# Patient Record
Sex: Male | Born: 1965 | Race: White | Hispanic: No | Smoking: Current every day smoker
Health system: Southern US, Community
[De-identification: ages and names within clinical notes are randomized; demographics above are authoritative.]

## PROBLEM LIST (undated history)

## (undated) DIAGNOSIS — R918 Other nonspecific abnormal finding of lung field: Secondary | ICD-10-CM

## (undated) DIAGNOSIS — C801 Malignant (primary) neoplasm, unspecified: Secondary | ICD-10-CM

## (undated) DIAGNOSIS — J449 Chronic obstructive pulmonary disease, unspecified: Secondary | ICD-10-CM

## (undated) HISTORY — PX: OTHER SURGICAL HISTORY: SHX169

---

## 2018-03-09 ENCOUNTER — Other Ambulatory Visit: Payer: Self-pay

## 2018-03-09 ENCOUNTER — Encounter: Payer: Self-pay | Admitting: Emergency Medicine

## 2018-03-09 ENCOUNTER — Inpatient Hospital Stay
Admission: EM | Admit: 2018-03-09 | Discharge: 2018-03-13 | DRG: 181 | Disposition: A | Discharge status: Home / Self Care | Payer: Self-pay | Attending: Internal Medicine | Admitting: Internal Medicine

## 2018-03-09 ENCOUNTER — Emergency Department: Payer: Self-pay

## 2018-03-09 DIAGNOSIS — I82210 Acute embolism and thrombosis of superior vena cava: Secondary | ICD-10-CM | POA: Diagnosis present

## 2018-03-09 DIAGNOSIS — C3491 Malignant neoplasm of unspecified part of right bronchus or lung: Principal | ICD-10-CM | POA: Diagnosis present

## 2018-03-09 DIAGNOSIS — J449 Chronic obstructive pulmonary disease, unspecified: Secondary | ICD-10-CM | POA: Diagnosis present

## 2018-03-09 DIAGNOSIS — R0602 Shortness of breath: Secondary | ICD-10-CM

## 2018-03-09 DIAGNOSIS — I82290 Acute embolism and thrombosis of other thoracic veins: Secondary | ICD-10-CM | POA: Diagnosis present

## 2018-03-09 DIAGNOSIS — I871 Compression of vein: Secondary | ICD-10-CM | POA: Diagnosis present

## 2018-03-09 DIAGNOSIS — Z9889 Other specified postprocedural states: Secondary | ICD-10-CM

## 2018-03-09 DIAGNOSIS — F17210 Nicotine dependence, cigarettes, uncomplicated: Secondary | ICD-10-CM | POA: Diagnosis present

## 2018-03-09 DIAGNOSIS — C3490 Malignant neoplasm of unspecified part of unspecified bronchus or lung: Secondary | ICD-10-CM

## 2018-03-09 HISTORY — DX: Chronic obstructive pulmonary disease, unspecified: J44.9

## 2018-03-09 HISTORY — DX: Other nonspecific abnormal finding of lung field: R91.8

## 2018-03-09 LAB — CBC
HEMATOCRIT: 40.2 % (ref 39.0–52.0)
Hemoglobin: 12.7 g/dL — ABNORMAL LOW (ref 13.0–17.0)
MCH: 28.5 pg (ref 26.0–34.0)
MCHC: 31.6 g/dL (ref 30.0–36.0)
MCV: 90.1 fL (ref 80.0–100.0)
NRBC: 0 % (ref 0.0–0.2)
PLATELETS: 258 10*3/uL (ref 150–400)
RBC: 4.46 MIL/uL (ref 4.22–5.81)
RDW: 13.2 % (ref 11.5–15.5)
WBC: 10 10*3/uL (ref 4.0–10.5)

## 2018-03-09 LAB — COMPREHENSIVE METABOLIC PANEL
ALBUMIN: 4 g/dL (ref 3.5–5.0)
ALT: 9 U/L (ref 0–44)
AST: 14 U/L — ABNORMAL LOW (ref 15–41)
Alkaline Phosphatase: 103 U/L (ref 38–126)
Anion gap: 11 (ref 5–15)
BUN: 10 mg/dL (ref 6–20)
CHLORIDE: 101 mmol/L (ref 98–111)
CO2: 28 mmol/L (ref 22–32)
CREATININE: 0.88 mg/dL (ref 0.61–1.24)
Calcium: 9.1 mg/dL (ref 8.9–10.3)
GFR calc Af Amer: >60 mL/min (ref >60)
GLUCOSE: 103 mg/dL — AB (ref 70–99)
POTASSIUM: 4.2 mmol/L (ref 3.5–5.1)
Sodium: 140 mmol/L (ref 135–145)
Total Bilirubin: 0.5 mg/dL (ref 0.3–1.2)
Total Protein: 7.6 g/dL (ref 6.5–8.1)

## 2018-03-09 LAB — TROPONIN I: Troponin I: <0.03 ng/mL (ref <0.03)

## 2018-03-09 LAB — PROTIME-INR
INR: 1.01
PROTHROMBIN TIME: 13.2 s (ref 11.4–15.2)

## 2018-03-09 LAB — HEPARIN LEVEL (UNFRACTIONATED): HEPARIN UNFRACTIONATED: 0.25 [IU]/mL — AB (ref 0.30–0.70)

## 2018-03-09 LAB — APTT: APTT: 34 s (ref 24–36)

## 2018-03-09 LAB — TSH: TSH: 1.759 u[IU]/mL (ref 0.350–4.500)

## 2018-03-09 LAB — T4, FREE: Free T4: 1.02 ng/dL (ref 0.82–1.77)

## 2018-03-09 MED ORDER — HEPARIN BOLUS VIA INFUSION
4000.0000 [IU] | Freq: Once | INTRAVENOUS | Status: AC
  Administered 2018-03-09: 4000 [IU] via INTRAVENOUS
  Filled 2018-03-09: qty 4000

## 2018-03-09 MED ORDER — SODIUM CHLORIDE 0.9% FLUSH: 3.0000 mL | INTRAVENOUS | Status: DC | PRN

## 2018-03-09 MED ORDER — SENNOSIDES-DOCUSATE SODIUM 8.6-50 MG PO TABS: 1.0000 | ORAL_TABLET | Freq: Every evening | ORAL | Status: DC | PRN

## 2018-03-09 MED ORDER — SODIUM CHLORIDE 0.9% FLUSH
3.0000 mL | Freq: Two times a day (BID) | INTRAVENOUS | Status: DC
  Administered 2018-03-09 – 2018-03-13 (×7): 3 mL via INTRAVENOUS

## 2018-03-09 MED ORDER — HEPARIN (PORCINE) IN NACL 100-0.45 UNIT/ML-% IJ SOLN
1350.0000 [IU]/h | INTRAMUSCULAR | Status: DC
  Administered 2018-03-09: 1050 [IU]/h via INTRAVENOUS
  Administered 2018-03-10: 1350 [IU]/h via INTRAVENOUS
  Filled 2018-03-09 (×2): qty 250

## 2018-03-09 MED ORDER — SODIUM CHLORIDE 0.9 % IV SOLN: 250.0000 mL | INTRAVENOUS | Status: DC | PRN

## 2018-03-09 MED ORDER — ACETAMINOPHEN 650 MG RE SUPP: 650.0000 mg | Freq: Four times a day (QID) | RECTAL | Status: DC | PRN

## 2018-03-09 MED ORDER — HYDROCODONE-ACETAMINOPHEN 5-325 MG PO TABS
1.0000 | ORAL_TABLET | ORAL | Status: DC | PRN
  Administered 2018-03-09: 1 via ORAL
  Filled 2018-03-09: qty 1

## 2018-03-09 MED ORDER — IPRATROPIUM-ALBUTEROL 0.5-2.5 (3) MG/3ML IN SOLN
3.0000 mL | Freq: Four times a day (QID) | RESPIRATORY_TRACT | Status: DC
  Administered 2018-03-09 – 2018-03-10 (×3): 3 mL via RESPIRATORY_TRACT
  Filled 2018-03-09 (×3): qty 3

## 2018-03-09 MED ORDER — ACETAMINOPHEN 325 MG PO TABS
650.0000 mg | ORAL_TABLET | Freq: Four times a day (QID) | ORAL | Status: DC | PRN
  Administered 2018-03-12: 650 mg via ORAL
  Filled 2018-03-09 (×2): qty 2

## 2018-03-09 MED ORDER — IOPAMIDOL (ISOVUE-300) INJECTION 61%
100.0000 mL | Freq: Once | INTRAVENOUS | Status: AC | PRN
  Administered 2018-03-09: 100 mL via INTRAVENOUS

## 2018-03-09 NOTE — Progress Notes (Signed)
Patient ID: Alexander [name], male   DOB: November 23, 1965, 52 y.o.   MRN: [id number] I have met and performed a preliminary assessment of the patient. He is a 52 year old current smoker with a 60 pack year history of smoking who has had issues with dyspnea for approximately two months and neck and facial swelling since approximately mid August. He was initially diagnosed with COPD and subsequently diagnosed with a potential allergic reaction to medications given for COPD. He presented today because of persistent symptoms. He has been seen in the emergency room and a CT scan of the chest shows that he has clot in the superior vena cava and there is evidence of a right para tracheal and right hilar mass. 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 The patient will need to be admitted for IV anticoagulation. He will require biopsy for definitive diagnosis. The patient is aware that he will need to have endobronchial ultrasound (EBUS) which will need to be done under general anesthesia. This procedure is tentatively scheduled for 10 o'clock in the morning on Thursday, October 17. This will allow for at least 24 to 48 hours of anticoagulation prior to the procedure. We will need to discontinue anticoagulation two hours before the procedure. I have discussed the case with Dr. Janese Banks, oncology, who came to evaluate the patient. A formal consult note will follow.

## 2018-03-09 NOTE — Progress Notes (Signed)
ANTICOAGULATION CONSULT NOTE - Initial Consult  Pharmacy Consult for heparin Indication: SVC thrombosis  Allergies  Allergen Reactions  . Penicillins     Patient Measurements: Height: 5\' 9"  (175.3 cm) Weight: 138 lb (62.6 kg) IBW/kg (Calculated) : 70.7 Heparin Dosing Weight: 62.6 kg  Vital Signs: Temp: 97.9 F (36.6 C) (10/15 1343) Temp Source: Oral (10/15 1343) BP: 119/88 (10/15 1343) Pulse Rate: 103 (10/15 1343)  Labs: Recent Labs    03/09/18 1051 03/09/18 1426  HGB 12.7*  --   HCT 40.2  --   PLT 258  --   CREATININE 0.88  --   TROPONINI  --  <0.03    Estimated Creatinine Clearance: 86.9 mL/min (by C-G formula based on SCr of 0.88 mg/dL).   Medical History: History reviewed. No pertinent past medical history.    Assessment: 52 yo male to start on heparin drip for SVC thrombosis. No PTA meds on med rec  Goal of Therapy:  Heparin level 0.3-0.7 units/ml Monitor platelets by anticoagulation protocol: Yes   Plan:  Heparin 4000 units IV x1 bolus then heparin drip at 1050 units/hr.  Heparin level 6h after start of heparin drip. CBC in AM  Pharmacy will continue to follow.   Rayna Sexton L 03/09/2018,3:49 PM

## 2018-03-09 NOTE — ED Notes (Signed)
Admitting MD at the bedside.  

## 2018-03-09 NOTE — H&P (Signed)
[facility] [place] NAME: Alexander [name]    MR#:  [id number]  DATE OF BIRTH:  24-Apr-1966  DATE OF ADMISSION:  03/09/2018  PRIMARY CARE PHYSICIAN: None   REQUESTING/REFERRING PHYSICIAN: Alfred Levins MD  CHIEF COMPLAINT: Facial and chest swelling  HISTORY OF PRESENT ILLNESS: Alexander [name]  is a 52 y.o. male with a known history of tobacco abuse, presented to the emergency room for swelling in the chest as well as facial and neck area.  He has dilated veins in the neck upper part of the chest.  He was evaluated at [facility] couple of weeks ago and treated for COPD.  Was worked up with CT chest and CT abdomen pelvis at our hospital which showed a lung mass and superior vena cava thrombosis.  This was discussed by ER physician with vascular surgery recommended IV heparin for anticoagulation.  Case was also discussed with oncologist by ER physician who recommended work-up for lung malignancy.  Patient is an active tobacco user.  Hospitalist service was consulted for further care.  PAST MEDICAL HISTORY:   Past Medical History:  Diagnosis Date  . COPD (chronic obstructive pulmonary disease) ([place])   . Lung mass     PAST SURGICAL HISTORY:  Past Surgical History:  Procedure Laterality Date  . none      SOCIAL HISTORY:  Social History   Tobacco Use  . Smoking status: Current Every Day Smoker  . Smokeless tobacco: Never Used  Substance Use Topics  . Alcohol use: Not Currently    FAMILY HISTORY: No family history on file.  DRUG ALLERGIES:  Allergies  Allergen Reactions  . Penicillins     REVIEW OF SYSTEMS:   CONSTITUTIONAL: No fever, has fatigue and weakness.  EYES: No blurred or double vision.  EARS, NOSE, AND THROAT: No tinnitus or ear pain.  RESPIRATORY: No cough,has  shortness of breath, no wheezing or hemoptysis.  CARDIOVASCULAR: No chest pain, orthopnea, edema.  GASTROINTESTINAL: No nausea, vomiting, diarrhea or abdominal pain.   GENITOURINARY: No dysuria, hematuria.  ENDOCRINE: No polyuria, nocturia,  HEMATOLOGY: No anemia, easy bruising or bleeding SKIN: Dilated veins over the chest wall in the upper part of the neck MUSCULOSKELETAL: No joint pain or arthritis.   NEUROLOGIC: No tingling, numbness, weakness.  PSYCHIATRY: No anxiety or depression.   MEDICATIONS AT HOME:  Prior to Admission medications   Not on File      PHYSICAL EXAMINATION:   VITAL SIGNS: Blood pressure 127/88, pulse 82, temperature 97.9 F (36.6 C), temperature source Oral, resp. rate 17, height 5\' 9"  (1.753 m), weight 62.6 kg, SpO2 99 %.  GENERAL:  52 y.o.-year-old patient lying in the bed with no acute distress.  EYES: Pupils equal, round, reactive to light and accommodation. No scleral icterus. Extraocular muscles intact.  HEENT: Head atraumatic, normocephalic. Oropharynx and nasopharynx clear.  NECK:  Supple, no jugular venous distention. No thyroid enlargement, no tenderness.  LUNGS: Normal breath sounds bilaterally, scattered wheezing, no rales,rhonchi or crepitation. No use of accessory muscles of respiration.  CARDIOVASCULAR: S1, S2 normal. No murmurs, rubs, or gallops.  ABDOMEN: Soft, nontender, nondistended. Bowel sounds present. No organomegaly or mass.  EXTREMITIES: No pedal edema, cyanosis, or clubbing.  NEUROLOGIC: Cranial nerves II through XII are intact. Muscle strength 5/5 in all extremities. Sensation intact. Gait not checked.  PSYCHIATRIC: The patient is alert and oriented x 3.  SKIN: No obvious rash, lesion, or ulcer.   LABORATORY PANEL:   CBC Recent  Labs  Lab 03/09/18 1051  WBC 10.0  HGB 12.7*  HCT 40.2  PLT 258  MCV 90.1  MCH 28.5  MCHC 31.6  RDW 13.2   ------------------------------------------------------------------------------------------------------------------  Chemistries  Recent Labs  Lab 03/09/18 1051  NA 140  K 4.2  CL 101  CO2 28  GLUCOSE 103*  BUN 10  CREATININE 0.88  CALCIUM 9.1   AST 14*  ALT 9  ALKPHOS 103  BILITOT 0.5   ------------------------------------------------------------------------------------------------------------------ estimated creatinine clearance is 86.9 mL/min (by C-G formula based on SCr of 0.88 mg/dL). ------------------------------------------------------------------------------------------------------------------ Recent Labs    03/09/18 1426  TSH 1.759     Coagulation profile Recent Labs  Lab 03/09/18 1608  INR 1.01   ------------------------------------------------------------------------------------------------------------------- No results for input(s): DDIMER in the last 72 hours. -------------------------------------------------------------------------------------------------------------------  Cardiac Enzymes Recent Labs  Lab 03/09/18 1426  TROPONINI <0.03   ------------------------------------------------------------------------------------------------------------------ Invalid input(s): POCBNP  ---------------------------------------------------------------------------------------------------------------  Urinalysis No results found for: COLORURINE, APPEARANCEUR, LABSPEC, PHURINE, GLUCOSEU, HGBUR, BILIRUBINUR, KETONESUR, PROTEINUR, UROBILINOGEN, NITRITE, LEUKOCYTESUR   RADIOLOGY: Ct Chest W Contrast  Result Date: 03/09/2018 CLINICAL DATA:  Facial and neck swelling for several months. Abdominal pain. EXAM: CT CHEST, ABDOMEN, AND PELVIS WITH CONTRAST TECHNIQUE: Multidetector CT imaging of the chest, abdomen and pelvis was performed following the standard protocol during bolus administration of intravenous contrast. CONTRAST:  162mL ISOVUE-300 IOPAMIDOL (ISOVUE-300) INJECTION 61% COMPARISON:  None. FINDINGS: CT CHEST FINDINGS Cardiovascular: There is no evidence of thoracic aortic dissection or aneurysm. Normal cardiac size. No pericardial effusion is noted. Thrombosis of the superior vena cava is noted which is  nearly occlusive. Small amount of contrast is seen flowing into the right atrium. This results in multiple collateral veins. Probable thrombotic occlusion of left brachiocephalic vein is noted as well. Mediastinum/Nodes: Previously described SVC thrombosis and severe narrowing appears to be due to ill-defined soft tissue mass in right paratracheal and precarinal area measuring 3.2 x 2.3 cm, as well as right hilar mass or node measuring 2.2 x 1.7 cm. Thyroid gland is unremarkable. Esophagus is unremarkable. Lungs/Pleura: No pneumothorax or pleural effusion is noted. Emphysematous disease is noted in both upper lobes. 2.1 x 2.0 cm irregular mass is noted medially in the right upper lobe concerning for malignancy. Also noted is spiculated density measuring 12 x 9 mm with pleural tail in right upper lobe concerning for possible malignancy. Musculoskeletal: No chest wall mass or suspicious bone lesions identified. CT ABDOMEN PELVIS FINDINGS Hepatobiliary: No focal liver abnormality is seen. No gallstones, gallbladder wall thickening, or biliary dilatation. Pancreas: Unremarkable. No pancreatic ductal dilatation or surrounding inflammatory changes. Spleen: Normal in size without focal abnormality. Adrenals/Urinary Tract: Adrenal glands are unremarkable. Kidneys are normal, without renal calculi, focal lesion, or hydronephrosis. Bladder is unremarkable. Stomach/Bowel: The stomach appears normal. The appendix is not visualized. There is no evidence of bowel obstruction or inflammation. Vascular/Lymphatic: Atherosclerosis of abdominal aorta is noted without aneurysm or dissection. IVC and iliac veins appear to be patent. However, collateral veins are noted in the anterior subcutaneous tissues of the abdomen. No significant adenopathy is noted in the abdomen or pelvis. Reproductive: Prostate is unremarkable. Other: No abdominal wall hernia or abnormality. No abdominopelvic ascites. Musculoskeletal: No acute or significant  osseous findings. IMPRESSION: 2.1 x 2.0 cm irregular mass is noted medially in right upper lobe concerning for malignancy. Also noted is probable right paratracheal, right hilar and precarinal adenopathy or malignancy, which results in severe narrowing and thrombosis of the superior vena cava. This results in multiple collateral veins  in the chest and abdomen, as well as probable thrombosis of the left brachycephalic vein. This is consistent with SVC syndrome. PET scan is recommended for further evaluation. Aortic Atherosclerosis (ICD10-I70.0) and Emphysema (ICD10-J43.9). Electronically Signed   By: Marijo Conception, M.D.   On: 03/09/2018 15:19   Ct Abdomen Pelvis W Contrast  Result Date: 03/09/2018 CLINICAL DATA:  Facial and neck swelling for several months. Abdominal pain. EXAM: CT CHEST, ABDOMEN, AND PELVIS WITH CONTRAST TECHNIQUE: Multidetector CT imaging of the chest, abdomen and pelvis was performed following the standard protocol during bolus administration of intravenous contrast. CONTRAST:  111mL ISOVUE-300 IOPAMIDOL (ISOVUE-300) INJECTION 61% COMPARISON:  None. FINDINGS: CT CHEST FINDINGS Cardiovascular: There is no evidence of thoracic aortic dissection or aneurysm. Normal cardiac size. No pericardial effusion is noted. 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 Small amount of contrast is seen flowing into the right atrium. This results in multiple collateral veins. Probable thrombotic occlusion of left brachiocephalic vein is noted as well. Mediastinum/Nodes: Previously described SVC thrombosis and severe narrowing appears to be due to ill-defined soft tissue mass in right paratracheal and precarinal area measuring 3.2 x 2.3 cm, as well as right hilar mass or node measuring 2.2 x 1.7 cm. Thyroid gland is unremarkable. Esophagus is unremarkable. Lungs/Pleura: No pneumothorax or pleural effusion is noted. Emphysematous disease is noted in both upper lobes. 2.1 x 2.0 cm  irregular mass is noted medially in the right upper lobe concerning for malignancy. Also noted is spiculated density measuring 12 x 9 mm with pleural tail in right upper lobe concerning for possible malignancy. Musculoskeletal: No chest wall mass or suspicious bone lesions identified. CT ABDOMEN PELVIS FINDINGS Hepatobiliary: No focal liver abnormality is seen. No gallstones, gallbladder wall thickening, or biliary dilatation. Pancreas: Unremarkable. No pancreatic ductal dilatation or surrounding inflammatory changes. Spleen: Normal in size without focal abnormality. Adrenals/Urinary Tract: Adrenal glands are unremarkable. Kidneys are normal, without renal calculi, focal lesion, or hydronephrosis. Bladder is unremarkable. Stomach/Bowel: The stomach appears normal. The appendix is not visualized. There is no evidence of bowel obstruction or inflammation. Vascular/Lymphatic: Atherosclerosis of abdominal aorta is noted without aneurysm or dissection. IVC and iliac veins appear to be patent. However, collateral veins are noted in the anterior subcutaneous tissues of the abdomen. No significant adenopathy is noted in the abdomen or pelvis. Reproductive: Prostate is unremarkable. Other: No abdominal wall hernia or abnormality. No abdominopelvic ascites. Musculoskeletal: No acute or significant osseous findings. IMPRESSION: 2.1 x 2.0 cm irregular mass is noted medially in right upper lobe concerning for malignancy. Also noted is probable right paratracheal, right hilar and precarinal adenopathy or malignancy, which results in severe narrowing and thrombosis of the superior vena cava. This results in multiple collateral veins in the chest and abdomen, as well as probable thrombosis of the left brachycephalic vein. This is consistent with SVC syndrome. PET scan is recommended for further evaluation. Aortic Atherosclerosis (ICD10-I70.0) and Emphysema (ICD10-J43.9). Electronically Signed   By: Marijo Conception, M.D.   On:  03/09/2018 15:19    EKG: Orders placed or performed during the hospital encounter of 03/09/18  . ED EKG  . ED EKG  . EKG 12-Lead  . EKG 12-Lead  . EKG 12-Lead  . EKG 12-Lead  . EKG 12-Lead  . 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 Management plans discussed with the patient, family and they are in agreement.  CODE STATUS:Full code    TOTAL TIME TAKING CARE OF THIS PATIENT: 52 minutes.    Saundra Shelling M.D on 03/09/2018 at 5:07 PM  Between 7am to 6pm - Pager - [phone]  After 6pm go to [email or web address] - password [email or web address] [place] [facility]  Office  [phone]  CC: Primary care physician; Patient, No Pcp Per

## 2018-03-09 NOTE — ED Provider Notes (Signed)
[facility] Emergency Department Provider Note  ____________________________________________  Time seen: Approximately 2:34 PM  I have reviewed the triage vital signs and the nursing notes.   HISTORY  Chief Complaint No chief complaint on file.   HPI Alexander [name] is a 52 y.o. male with a history of smoking and COPD who presents for evaluation of facial neck and chest swelling.  Patient reports a long history of smoking.  Was seen at [facility] in August and told he had COPD while a patient in the ED.  Never had any formal testing done.  He was put on steroids, albuterol and doxycycline.  A few days later he noticed swelling of the veins of his chest, right arm, bilateral neck and face.  He went back to [facility] and he was told that this was a side effect of the medication and told to stop all medications.  Patient reports that the swelling has been persistent for several months.  He usually wakes up in the morning and his face is very swollen, the neck veins are swollen, the right upper extremity and right chest wall have very swollen veins as well.  As the day goes by the swelling decreases.  He also reports a 30 pound unintentional weight loss over the last 3 to 4 months.  No night sweats.  No family history of cancer.  Patient denies any chest pain or shortness of breath.  PMH COPD  Allergies Penicillins  FH No h/o cancer  Social History Smoking - yes Alcohol - not currently  Drugs - no  Review of Systems  Constitutional: Negative for fever. Eyes: Negative for visual changes. ENT: Negative for sore throat. + b/l facial swelling Neck: No neck pain. + b/l neck vein swelling  Cardiovascular: Negative for chest pain. + chest vein swelling Respiratory: Negative for shortness of breath. Gastrointestinal: Negative for abdominal pain, vomiting or diarrhea. Genitourinary: Negative for dysuria. Musculoskeletal: Negative for back pain. Skin: Negative for  rash. Neurological: Negative for headaches, weakness or numbness. Psych: No SI or HI  ____________________________________________   PHYSICAL EXAM:  VITAL SIGNS: ED Triage Vitals  Enc Vitals Group     BP 03/09/18 1043 117/81     Pulse Rate 03/09/18 1043 89     Resp 03/09/18 1043 16     Temp 03/09/18 1051 98.2 F (36.8 C)     Temp Source 03/09/18 1051 Oral     SpO2 03/09/18 1043 99 %     Weight 03/09/18 1044 138 lb (62.6 kg)     Height 03/09/18 1044 5\' 9"  (1.753 m)     Head Circumference --      Peak Flow --      Pain Score 03/09/18 1044 4     Pain Loc --      Pain Edu? --      Excl. in [place]? --     Constitutional: Alert and oriented. Well appearing and in no apparent distress. HEENT:      Head: Normocephalic and atraumatic.  no swelling noted on face and head      Eyes: Conjunctivae are normal. Sclera is non-icteric. No ptosis or myosis      Mouth/Throat: Mucous membranes are moist.       Neck: Supple with no signs of meningismus. Patient has distended bilateral neck veins Cardiovascular: Regular rate and rhythm. No murmurs, gallops, or rubs. 2+ symmetrical distal pulses are present in all extremities. No JVD. Chest wall shows distended veins bilaterally worse on  the R Respiratory: Normal respiratory effort. Lungs are clear to auscultation bilaterally. No wheezes, crackles, or rhonchi.  Gastrointestinal: Soft, non tender, and non distended with positive bowel sounds. No rebound or guarding. Musculoskeletal: Nontender with normal range of motion in all extremities. No edema, cyanosis, or erythema of extremities. Neurologic: Normal speech and language. Face is symmetric. Moving all extremities. No gross focal neurologic deficits are appreciated. Skin: Skin is warm, dry and intact. No rash noted. Psychiatric: Mood and affect are normal. Speech and behavior are normal.  ____________________________________________   LABS (all labs ordered are listed, but only abnormal results  are displayed)  Labs Reviewed  CBC - Abnormal; Notable for the following components:      Result Value   Hemoglobin 12.7 (*)    All other components within normal limits  COMPREHENSIVE METABOLIC PANEL - Abnormal; Notable for the following components:   Glucose, Bld 103 (*)    AST 14 (*)    All other components within normal limits  TSH  T4, FREE  TROPONIN I   ____________________________________________  EKG  ED ECG REPORT I, Rudene Re, the attending physician, personally viewed and interpreted this ECG.  Normal sinus rhythm, rate of 84, normal intervals, right axis deviation, no ST elevations or depressions.  No prior for comparison peer ____________________________________________  RADIOLOGY  I have personally reviewed the images performed during this visit and I agree with the Radiologist's read.   Interpretation by Radiologist:  Ct Chest W Contrast  Result Date: 03/09/2018 CLINICAL DATA:  Facial and neck swelling for several months. Abdominal pain. EXAM: CT CHEST, ABDOMEN, AND PELVIS WITH CONTRAST TECHNIQUE: Multidetector CT imaging of the chest, abdomen and pelvis was performed following the standard protocol during bolus administration of intravenous contrast. CONTRAST:  135mL ISOVUE-300 IOPAMIDOL (ISOVUE-300) INJECTION 61% COMPARISON:  None. FINDINGS: CT CHEST FINDINGS Cardiovascular: There is no evidence of thoracic aortic dissection or aneurysm. Normal cardiac size. No pericardial effusion is noted. 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 Small amount of contrast is seen flowing into the right atrium. This results in multiple collateral veins. Probable thrombotic occlusion of left brachiocephalic vein is noted as well. Mediastinum/Nodes: Previously described SVC thrombosis and severe narrowing appears to be due to ill-defined soft tissue mass in right paratracheal and precarinal area measuring 3.2 x 2.3 cm, as well as right hilar  mass or node measuring 2.2 x 1.7 cm. Thyroid gland is unremarkable. Esophagus is unremarkable. Lungs/Pleura: No pneumothorax or pleural effusion is noted. Emphysematous disease is noted in both upper lobes. 2.1 x 2.0 cm irregular mass is noted medially in the right upper lobe concerning for malignancy. Also noted is spiculated density measuring 12 x 9 mm with pleural tail in right upper lobe concerning for possible malignancy. Musculoskeletal: No chest wall mass or suspicious bone lesions identified. CT ABDOMEN PELVIS FINDINGS Hepatobiliary: No focal liver abnormality is seen. No gallstones, gallbladder wall thickening, or biliary dilatation. Pancreas: Unremarkable. No pancreatic ductal dilatation or surrounding inflammatory changes. Spleen: Normal in size without focal abnormality. Adrenals/Urinary Tract: Adrenal glands are unremarkable. Kidneys are normal, without renal calculi, focal lesion, or hydronephrosis. Bladder is unremarkable. Stomach/Bowel: The stomach appears normal. The appendix is not visualized. There is no evidence of bowel obstruction or inflammation. Vascular/Lymphatic: Atherosclerosis of abdominal aorta is noted without aneurysm or dissection. IVC and iliac veins appear to be patent. However, collateral veins are noted in the anterior subcutaneous tissues of the abdomen. No significant adenopathy is noted in the  abdomen or pelvis. Reproductive: Prostate is unremarkable. Other: No abdominal wall hernia or abnormality. No abdominopelvic ascites. Musculoskeletal: No acute or significant osseous findings. IMPRESSION: 2.1 x 2.0 cm irregular mass is noted medially in right upper lobe concerning for malignancy. Also noted is probable right paratracheal, right hilar and precarinal adenopathy or malignancy, which results in severe narrowing and thrombosis of the superior vena cava. This results in multiple collateral veins in the chest and abdomen, as well as probable thrombosis of the left brachycephalic  vein. This is consistent with SVC syndrome. PET scan is recommended for further evaluation. Aortic Atherosclerosis (ICD10-I70.0) and Emphysema (ICD10-J43.9). Electronically Signed   By: Marijo Conception, M.D.   On: 03/09/2018 15:19   Ct Abdomen Pelvis W Contrast  Result Date: 03/09/2018 CLINICAL DATA:  Facial and neck swelling for several months. Abdominal pain. EXAM: CT CHEST, ABDOMEN, AND PELVIS WITH CONTRAST TECHNIQUE: Multidetector CT imaging of the chest, abdomen and pelvis was performed following the standard protocol during bolus administration of intravenous contrast. CONTRAST:  119mL ISOVUE-300 IOPAMIDOL (ISOVUE-300) INJECTION 61% COMPARISON:  None. FINDINGS: CT CHEST FINDINGS Cardiovascular: There is no evidence of thoracic aortic dissection or aneurysm. Normal cardiac size. No pericardial effusion is noted. 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 Small amount of contrast is seen flowing into the right atrium. This results in multiple collateral veins. Probable thrombotic occlusion of left brachiocephalic vein is noted as well. Mediastinum/Nodes: Previously described SVC thrombosis and severe narrowing appears to be due to ill-defined soft tissue mass in right paratracheal and precarinal area measuring 3.2 x 2.3 cm, as well as right hilar mass or node measuring 2.2 x 1.7 cm. Thyroid gland is unremarkable. Esophagus is unremarkable. Lungs/Pleura: No pneumothorax or pleural effusion is noted. Emphysematous disease is noted in both upper lobes. 2.1 x 2.0 cm irregular mass is noted medially in the right upper lobe concerning for malignancy. Also noted is spiculated density measuring 12 x 9 mm with pleural tail in right upper lobe concerning for possible malignancy. Musculoskeletal: No chest wall mass or suspicious bone lesions identified. CT ABDOMEN PELVIS FINDINGS Hepatobiliary: No focal liver abnormality is seen. No gallstones, gallbladder wall thickening, or biliary  dilatation. Pancreas: Unremarkable. No pancreatic ductal dilatation or surrounding inflammatory changes. Spleen: Normal in size without focal abnormality. Adrenals/Urinary Tract: Adrenal glands are unremarkable. Kidneys are normal, without renal calculi, focal lesion, or hydronephrosis. Bladder is unremarkable. Stomach/Bowel: The stomach appears normal. The appendix is not visualized. There is no evidence of bowel obstruction or inflammation. Vascular/Lymphatic: Atherosclerosis of abdominal aorta is noted without aneurysm or dissection. IVC and iliac veins appear to be patent. However, collateral veins are noted in the anterior subcutaneous tissues of the abdomen. No significant adenopathy is noted in the abdomen or pelvis. Reproductive: Prostate is unremarkable. Other: No abdominal wall hernia or abnormality. No abdominopelvic ascites. Musculoskeletal: No acute or significant osseous findings. IMPRESSION: 2.1 x 2.0 cm irregular mass is noted medially in right upper lobe concerning for malignancy. Also noted is probable right paratracheal, right hilar and precarinal adenopathy or malignancy, which results in severe narrowing and thrombosis of the superior vena cava. This results in multiple collateral veins in the chest and abdomen, as well as probable thrombosis of the left brachycephalic vein. This is consistent with SVC syndrome. PET scan is recommended for further evaluation. Aortic Atherosclerosis (ICD10-I70.0) and Emphysema (ICD10-J43.9). Electronically Signed   By: Marijo Conception, M.D.   On: 03/09/2018 15:19  ____________________________________________   PROCEDURES  Procedure(s) performed: None Procedures Critical Care performed: yes  CRITICAL CARE Performed by: Rudene Re  ?  Total critical care time: 35 min  Critical care time was exclusive of separately billable procedures and treating other patients.  Critical care was necessary to treat or prevent imminent or  life-threatening deterioration.  Critical care was time spent personally by me on the following activities: development of treatment plan with patient and/or surrogate as well as nursing, discussions with consultants, evaluation of patient's response to treatment, examination of patient, obtaining history from patient or surrogate, ordering and performing treatments and interventions, ordering and review of laboratory studies, ordering and review of radiographic studies, pulse oximetry and re-evaluation of patient's condition.  ____________________________________________   INITIAL IMPRESSION / ASSESSMENT AND PLAN / ED COURSE   52 y.o. male with a history of smoking and COPD who presents for evaluation of facial, neck, and chest swelling, 30 lbs unintentional weight loss.  On exam patient does have distended bilateral neck and chest veins, no obvious distention of veins in the extremities, no swelling of the face.  No ptosis or myosis.  With his symptoms I am obviously concerned for malignancy causing some type of vena cava syndrome. Will send patient for CT of the chest and abdomen. EKG and labs pending.     _________________________ 3:37 PM on 03/09/2018 -----------------------------------------  CT concerning for right upper lobe malignancy with a significant adenopathy in the head and neck causing SVC syndrome, SVC thrombosis, left brachiocephalic vein thrombosis.  Discussed with Dr. Leotis Pain who recommended started patient on heparin.  Discussed with Dr. Janese Banks from oncology who will consult. Discussed with Dr. Annamaria Boots for admission.  Patient updated on the findings of CT scan and need for admission.   As part of my medical decision making, I reviewed the following data within the [place] notes reviewed and incorporated, Labs reviewed , EKG interpreted , Old chart reviewed, Radiograph reviewed , Discussed with admitting physician , A consult was requested and obtained  from this/these consultant(s) vascular surgery and oncology, Notes from prior ED visits and [place] Controlled Substance Database    Pertinent labs & imaging results that were available during my care of the patient were reviewed by me and considered in my medical decision making (see chart for details).    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    Rudene Re, MD 03/09/18 [phone]

## 2018-03-09 NOTE — Progress Notes (Signed)
Advanced care plan.  Purpose of the Encounter: CODE STATUS  Parties in Attendance: Patient  Patient's Decision Capacity: Good  Subjective/Patient's story: Presented to the emergency room with facial and neck swelling  Objective/Medical story Has superior vena caval syndrome and thrombosis of the SVC Needs anticoagulation with IV heparin drip  Goals of care determination:  Advance care directives and goals of care discussed  Treatment plan discussed with Patient wants everything done which includes CPR, intubation ventilator if the need arises   CODE STATUS: Full code   Time spent discussing advanced care planning: 16 minutes

## 2018-03-09 NOTE — Consult Note (Signed)
Hematology/Oncology Consult note [facility] Telephone:([phone] [phone]  Patient Care Team: Patient, No Pcp Per as PCP - General (General Practice)   Name of the patient: Alexander [name]  [id number]  1966/02/15    Reason for consult: lung mass. SVC syndrome   Requesting physician: Dr. Alfred Levins  Date of visit: 03/09/2018    History of presenting illness-patient is a 52 year old male with a long-standing history of smoking.  He has smoked 1 to 1/2 pack of cigarettes per day for over 30 years.  He presented to outside urgent care with some symptoms of shortness of breath and was treated for URI.  He subsequently presented with facial and neck swelling which was thought by outside ER to be secondary to a drug reaction.  Following that patient came to ER here at [facility].  He underwent CT chest abdomen and pelvis which showed 2.1 x 2 cm irregular mass in the right upper lobe concerning for malignancy..  Multiple collateral veins in chest and abdomen probable thrombosis of the left brachiocephalic vein.  This is consistent with IVC syndrome  Probable right paratracheal right hilar and precarinal adenopathy resulting in severe narrowing and thrombosis of SVC.  This is consistent with SVC syndrome  Patient reports he has a good appetite and denies any unintentional weight loss.  He reports some pain in his right shoulder but denies other complaints.  He does not feel short of breath at rest.  ECOG PS- 0  Pain scale- 0   Review of systems- Review of Systems  Constitutional: Negative for chills, fever, malaise/fatigue and weight loss.  HENT: Negative for congestion, ear discharge and nosebleeds.        Neck swelling  Eyes: Negative for blurred vision.  Respiratory: Negative for cough, hemoptysis, sputum production, shortness of breath and wheezing.   Cardiovascular: Negative for chest pain, palpitations, orthopnea and claudication.  Gastrointestinal:  Negative for abdominal pain, blood in stool, constipation, diarrhea, heartburn, melena, nausea and vomiting.  Genitourinary: Negative for dysuria, flank pain, frequency, hematuria and urgency.  Musculoskeletal: Negative for back pain, joint pain and myalgias.  Skin: Negative for rash.  Neurological: Negative for dizziness, tingling, focal weakness, seizures, weakness and headaches.  Endo/Heme/Allergies: Does not bruise/bleed easily.  Psychiatric/Behavioral: Negative for depression and suicidal ideas. The patient does not have insomnia.     Allergies  Allergen Reactions  . Penicillins     Patient Active Problem List   Diagnosis Date Noted  . 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 COPD (chronic obstructive pulmonary disease) ([place])   . Lung mass      Past Surgical History:  Procedure Laterality Date  . none      Social History   Socioeconomic History  . Marital status: Divorced    Spouse name: Not on file  . Number of children: Not on file  . Years of education: Not on file  . Highest education level: Not on file  Occupational History  . Occupation: Cabin crew  Social Needs  . Financial resource strain: Not on file  . Food insecurity:    Worry: Not on file    Inability: Not on file  . Transportation needs:    Medical: Not on file    Non-medical: Not on file  Tobacco Use  . Smoking status: Current Every Day Smoker  . Smokeless tobacco: Never Used  Substance and Sexual Activity  . Alcohol use: Not Currently  . Drug  use: Not Currently  . Sexual activity: Yes  Lifestyle  . Physical activity:    Days per week: Not on file    Minutes per session: Not on file  . Stress: Not on file  Relationships  . Social connections:    Talks on phone: Not on file    Gets together: Not on file    Attends religious service: Not on file    Active member of club or organization: Not on file    Attends meetings of clubs or organizations: Not on  file    Relationship status: Not on file  . Intimate partner violence:    Fear of current or ex partner: Not on file    Emotionally abused: Not on file    Physically abused: Not on file    Forced sexual activity: Not on file  Other Topics Concern  . Not on file  Social History Narrative  . Not on file     No family history on file.   Current Facility-Administered Medications:  .  0.9 %  sodium chloride infusion, 250 mL, Intravenous, PRN, Pyreddy, Pavan, MD .  acetaminophen (TYLENOL) tablet 650 mg, 650 mg, Oral, Q6H PRN **OR** acetaminophen (TYLENOL) suppository 650 mg, 650 mg, Rectal, Q6H PRN, Pyreddy, Pavan, MD .  [COMPLETED] heparin bolus via infusion 4,000 Units, 4,000 Units, Intravenous, Once, 4,000 Units at 03/09/18 1652 **FOLLOWED BY** heparin ADULT infusion 100 units/mL (25000 units/269mL sodium chloride 0.45%), 1,050 Units/hr, Intravenous, Continuous, Rocky Morel, RPH, Last Rate: 10.5 mL/hr at 03/09/18 1651, 1,050 Units/hr at 03/09/18 1651 .  HYDROcodone-acetaminophen (NORCO/VICODIN) 5-325 MG per tablet 1-2 tablet, 1-2 tablet, Oral, Q4H PRN, Pyreddy, Pavan, MD .  ipratropium-albuterol (DUONEB) 0.5-2.5 (3) MG/3ML nebulizer solution 3 mL, 3 mL, Nebulization, Q6H, Pyreddy, Pavan, MD .  senna-docusate (Senokot-S) tablet 1 tablet, 1 tablet, Oral, QHS PRN, Pyreddy, Pavan, MD .  sodium chloride flush (NS) 0.9 % injection 3 mL, 3 mL, Intravenous, Q12H, Pyreddy, Pavan, MD .  sodium chloride flush (NS) 0.9 % injection 3 mL, 3 mL, Intravenous, PRN, Saundra Shelling, MD   Physical exam:  Vitals:   03/09/18 1343 03/09/18 1600 03/09/18 1700 03/09/18 1818  BP: 119/88 127/88 (!) 145/93 107/88  Pulse: (!) 103 82 96 (!) 104  Resp: 20 17 16 20   Temp: 97.9 F (36.6 C)   98.4 F (36.9 C)  TempSrc: Oral   Oral  SpO2: 98% 99% 98% 96%  Weight:    136 lb 3.2 oz (61.8 kg)  Height:    5\' 9"  (1.753 m)   Physical Exam  Constitutional: He is oriented to person, place, and time. He appears  well-developed and well-nourished.  HENT:  Head: Normocephalic and atraumatic.  Bilateral distended neck veins as well as neck swelling and mild facial swelling noted. Chest wall veins appear normal.  Eyes: Pupils are equal, round, and reactive to light. EOM are normal.  Neck: Normal range of motion.  Cardiovascular: Normal rate, regular rhythm and normal heart sounds.  Pulmonary/Chest: Effort normal and breath sounds normal.  Abdominal: Soft. Bowel sounds are normal.  Lymphadenopathy:  No palpable cervical or supraclavicular adenopathy  Neurological: He is alert and oriented to person, place, and time.  Skin: Skin is warm and dry.       CMP Latest Ref Rng & Units 03/09/2018  Glucose 70 - 99 mg/dL 103(H)  BUN 6 - 20 mg/dL 10  Creatinine 0.61 - 1.24 mg/dL 0.88  Sodium 135 - 145 mmol/L 140  Potassium 3.5 -  5.1 mmol/L 4.2  Chloride 98 - 111 mmol/L 101  CO2 22 - 32 mmol/L 28  Calcium 8.9 - 10.3 mg/dL 9.1  Total Protein 6.5 - 8.1 g/dL 7.6  Total Bilirubin 0.3 - 1.2 mg/dL 0.5  Alkaline Phos 38 - 126 U/L 103  AST 15 - 41 U/L 14(L)  ALT 0 - 44 U/L 9   CBC Latest Ref Rng & Units 03/09/2018  WBC 4.0 - 10.5 K/uL 10.0  Hemoglobin 13.0 - 17.0 g/dL 12.7(L)  Hematocrit 39.0 - 52.0 % 40.2  Platelets 150 - 400 K/uL 258    @IMAGES @  Ct Chest W Contrast  Result Date: 03/09/2018 CLINICAL DATA:  Facial and neck swelling for several months. Abdominal pain. EXAM: CT CHEST, ABDOMEN, AND PELVIS WITH CONTRAST TECHNIQUE: Multidetector CT imaging of the chest, abdomen and pelvis was performed following the standard protocol during bolus administration of intravenous contrast. CONTRAST:  111mL ISOVUE-300 IOPAMIDOL (ISOVUE-300) INJECTION 61% COMPARISON:  None. FINDINGS: CT CHEST FINDINGS Cardiovascular: There is no evidence of thoracic aortic dissection or aneurysm. Normal cardiac size. No pericardial effusion is noted. 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 Small  amount of contrast is seen flowing into the right atrium. This results in multiple collateral veins. Probable thrombotic occlusion of left brachiocephalic vein is noted as well. Mediastinum/Nodes: Previously described SVC thrombosis and severe narrowing appears to be due to ill-defined soft tissue mass in right paratracheal and precarinal area measuring 3.2 x 2.3 cm, as well as right hilar mass or node measuring 2.2 x 1.7 cm. Thyroid gland is unremarkable. Esophagus is unremarkable. Lungs/Pleura: No pneumothorax or pleural effusion is noted. Emphysematous disease is noted in both upper lobes. 2.1 x 2.0 cm irregular mass is noted medially in the right upper lobe concerning for malignancy. Also noted is spiculated density measuring 12 x 9 mm with pleural tail in right upper lobe concerning for possible malignancy. Musculoskeletal: No chest wall mass or suspicious bone lesions identified. CT ABDOMEN PELVIS FINDINGS Hepatobiliary: No focal liver abnormality is seen. No gallstones, gallbladder wall thickening, or biliary dilatation. Pancreas: Unremarkable. No pancreatic ductal dilatation or surrounding inflammatory changes. Spleen: Normal in size without focal abnormality. Adrenals/Urinary Tract: Adrenal glands are unremarkable. Kidneys are normal, without renal calculi, focal lesion, or hydronephrosis. Bladder is unremarkable. Stomach/Bowel: The stomach appears normal. The appendix is not visualized. There is no evidence of bowel obstruction or inflammation. Vascular/Lymphatic: Atherosclerosis of abdominal aorta is noted without aneurysm or dissection. IVC and iliac veins appear to be patent. However, collateral veins are noted in the anterior subcutaneous tissues of the abdomen. No significant adenopathy is noted in the abdomen or pelvis. Reproductive: Prostate is unremarkable. Other: No abdominal wall hernia or abnormality. No abdominopelvic ascites. Musculoskeletal: No acute or significant osseous findings.  IMPRESSION: 2.1 x 2.0 cm irregular mass is noted medially in right upper lobe concerning for malignancy. Also noted is probable right paratracheal, right hilar and precarinal adenopathy or malignancy, which results in severe narrowing and thrombosis of the superior vena cava. This results in multiple collateral veins in the chest and abdomen, as well as probable thrombosis of the left brachycephalic vein. This is consistent with SVC syndrome. PET scan is recommended for further evaluation. Aortic Atherosclerosis (ICD10-I70.0) and Emphysema (ICD10-J43.9). Electronically Signed   By: Marijo Conception, M.D.   On: 03/09/2018 15:19   Ct Abdomen Pelvis W Contrast  Result Date: 03/09/2018 CLINICAL DATA:  Facial and neck swelling for several months. Abdominal pain. EXAM:  CT CHEST, ABDOMEN, AND PELVIS WITH CONTRAST TECHNIQUE: Multidetector CT imaging of the chest, abdomen and pelvis was performed following the standard protocol during bolus administration of intravenous contrast. CONTRAST:  182mL ISOVUE-300 IOPAMIDOL (ISOVUE-300) INJECTION 61% COMPARISON:  None. FINDINGS: CT CHEST FINDINGS Cardiovascular: There is no evidence of thoracic aortic dissection or aneurysm. Normal cardiac size. No pericardial effusion is noted. 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 Small amount of contrast is seen flowing into the right atrium. This results in multiple collateral veins. Probable thrombotic occlusion of left brachiocephalic vein is noted as well. Mediastinum/Nodes: Previously described SVC thrombosis and severe narrowing appears to be due to ill-defined soft tissue mass in right paratracheal and precarinal area measuring 3.2 x 2.3 cm, as well as right hilar mass or node measuring 2.2 x 1.7 cm. Thyroid gland is unremarkable. Esophagus is unremarkable. Lungs/Pleura: No pneumothorax or pleural effusion is noted. Emphysematous disease is noted in both upper lobes. 2.1 x 2.0 cm irregular mass is  noted medially in the right upper lobe concerning for malignancy. Also noted is spiculated density measuring 12 x 9 mm with pleural tail in right upper lobe concerning for possible malignancy. Musculoskeletal: No chest wall mass or suspicious bone lesions identified. CT ABDOMEN PELVIS FINDINGS Hepatobiliary: No focal liver abnormality is seen. No gallstones, gallbladder wall thickening, or biliary dilatation. Pancreas: Unremarkable. No pancreatic ductal dilatation or surrounding inflammatory changes. Spleen: Normal in size without focal abnormality. Adrenals/Urinary Tract: Adrenal glands are unremarkable. Kidneys are normal, without renal calculi, focal lesion, or hydronephrosis. Bladder is unremarkable. Stomach/Bowel: The stomach appears normal. The appendix is not visualized. There is no evidence of bowel obstruction or inflammation. Vascular/Lymphatic: Atherosclerosis of abdominal aorta is noted without aneurysm or dissection. IVC and iliac veins appear to be patent. However, collateral veins are noted in the anterior subcutaneous tissues of the abdomen. No significant adenopathy is noted in the abdomen or pelvis. Reproductive: Prostate is unremarkable. Other: No abdominal wall hernia or abnormality. No abdominopelvic ascites. Musculoskeletal: No acute or significant osseous findings. IMPRESSION: 2.1 x 2.0 cm irregular mass is noted medially in right upper lobe concerning for malignancy. Also noted is probable right paratracheal, right hilar and precarinal adenopathy or malignancy, which results in severe narrowing and thrombosis of the superior vena cava. This results in multiple collateral veins in the chest and abdomen, as well as probable thrombosis of the left brachycephalic vein. This is consistent with SVC syndrome. PET scan is recommended for further evaluation. Aortic Atherosclerosis (ICD10-I70.0) and Emphysema (ICD10-J43.9). Electronically Signed   By: Marijo Conception, M.D.   On: 03/09/2018 15:19     Assessment and plan- Patient is a 52 y.o. male presenting with SVC thrombosis and SVC syndrome clinically as well as seen on CT scan.  He is found to have a right upper lobe lung mass as well as mediastinal adenopathy concerning for lung cancer  1.  I have reviewed CT chest abdomen and pelvis images independently and I discussed findings with the patient.  Patient noted to have right upper lobe lung mass as well as mediastinal adenopathy causing severe narrowing of his SVC and resulting SVC thrombosis.  2.  Patient will need to be put on heparin drip for his SVC thrombosis and will also need vascular surgery consult to see if there would be any role for endovascular therapies  3.  I have already spoken to Dr. Patsey Berthold from pulmonary and she will be doing a needle guided biopsy of  the mediastinal lymph nodes on Thursday, 03/11/2018.  Please keep him n.p.o. after midnight and touch base with pulmonary regarding stopping the heparin drip prior to procedure.  4.  I will touch base with radiation oncology tomorrow morning so that they can plan radiation and proceed with radiation asap once the biopsy results are back.  5.  I discussed with the patient that the findings of CT scan highly concerning for lung cancer reportedly less likely.  I also got in touch with pathology to see if they could expedite his pathology results once the bronchoscopy is done so that he can start chemotherapy ASAP.  CT chest abdomen and pelvis does not reveal any evidence of distant metastatic disease and we are likely dealing with stage III lung cancer which would be treated with concurrent chemoradiation.  Patient will need port placement in anticipation of chemotherapy and it is better to get this done as an inpatient while he is on heparin drip  6.  There would be no role for empiric steroids at this time. Patient is not acutely short of breath and he is saturating 98 to 99% on room air.  7.  Patient will need MRI of the  brain to complete his staging work-up and I will arrange for a PET CT scan upon his discharge   Total face to face encounter time for this patient visit was 30 min. >50% of the time was  spent in counseling and coordination of care. >40 min spent in coordinating care across pulmonary and pathology as well.    Visit Diagnosis 1. Malignant neoplasm of right lung, unspecified part of lung ([place])   2. 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 Randa Evens, MD, MPH [facility] [phone] 03/09/2018  7:42 PM

## 2018-03-09 NOTE — ED Notes (Signed)
Pt reports intermittent swelling to R chest, R arm, face and neck since August 1 after receiving abx from [facility]. Pt states was told it would resolve on it's own but swelling has not resolved. Pt is alert and oriented. States when he wakes up swelling is worse, minimal swelling noted at this time.

## 2018-03-09 NOTE — ED Triage Notes (Addendum)
PT to ED from [facility]  with c/o facial swelling and neck swelling xfew months. PT states was seen at [facility] when first began xfew months ago and told he had allergic reaction to a medication and sent home. PT states not getting any better, no resp complaints. Swelling worse at night., Speech clear, RR even and unlabored. Vein distention noted throughout neck and abd. NAD noted.

## 2018-03-09 NOTE — ED Notes (Signed)
Call to 1C to give report

## 2018-03-09 NOTE — ED Notes (Signed)
Pharmacy notified to verify and send Heparin

## 2018-03-10 ENCOUNTER — Inpatient Hospital Stay: Payer: Self-pay

## 2018-03-10 ENCOUNTER — Ambulatory Visit
Admit: 2018-03-10 | Discharge: 2018-03-10 | Disposition: A | Discharge status: Home / Self Care | Payer: Self-pay | Source: Ambulatory Visit | Attending: Radiation Oncology | Admitting: Radiation Oncology

## 2018-03-10 ENCOUNTER — Ambulatory Visit: Payer: Self-pay | Attending: Radiation Oncology | Admitting: Radiation Oncology

## 2018-03-10 ENCOUNTER — Encounter: Payer: Self-pay | Admitting: *Deleted

## 2018-03-10 DIAGNOSIS — C3491 Malignant neoplasm of unspecified part of right bronchus or lung: Secondary | ICD-10-CM | POA: Insufficient documentation

## 2018-03-10 DIAGNOSIS — R918 Other nonspecific abnormal finding of lung field: Secondary | ICD-10-CM

## 2018-03-10 DIAGNOSIS — I871 Compression of vein: Secondary | ICD-10-CM

## 2018-03-10 DIAGNOSIS — J432 Centrilobular emphysema: Secondary | ICD-10-CM

## 2018-03-10 DIAGNOSIS — Z510 Encounter for antineoplastic radiation therapy: Secondary | ICD-10-CM | POA: Insufficient documentation

## 2018-03-10 LAB — BASIC METABOLIC PANEL
ANION GAP: 9 (ref 5–15)
BUN: 10 mg/dL (ref 6–20)
CO2: 28 mmol/L (ref 22–32)
Calcium: 9 mg/dL (ref 8.9–10.3)
Chloride: 102 mmol/L (ref 98–111)
Creatinine, Ser: 0.95 mg/dL (ref 0.61–1.24)
GFR calc Af Amer: >60 mL/min (ref >60)
GLUCOSE: 95 mg/dL (ref 70–99)
POTASSIUM: 3.9 mmol/L (ref 3.5–5.1)
Sodium: 139 mmol/L (ref 135–145)

## 2018-03-10 LAB — CBC
HEMATOCRIT: 36.5 % — AB (ref 39.0–52.0)
Hemoglobin: 11.6 g/dL — ABNORMAL LOW (ref 13.0–17.0)
MCH: 28.6 pg (ref 26.0–34.0)
MCHC: 31.8 g/dL (ref 30.0–36.0)
MCV: 89.9 fL (ref 80.0–100.0)
NRBC: 0 % (ref 0.0–0.2)
Platelets: 233 10*3/uL (ref 150–400)
RBC: 4.06 MIL/uL — AB (ref 4.22–5.81)
RDW: 13.1 % (ref 11.5–15.5)
WBC: 7.6 10*3/uL (ref 4.0–10.5)

## 2018-03-10 LAB — HEPARIN LEVEL (UNFRACTIONATED)
HEPARIN UNFRACTIONATED: 0.13 [IU]/mL — AB (ref 0.30–0.70)
Heparin Unfractionated: 0.38 IU/mL (ref 0.30–0.70)
Heparin Unfractionated: 0.52 IU/mL (ref 0.30–0.70)

## 2018-03-10 MED ORDER — LACTATED RINGERS IV SOLN: INTRAVENOUS | Status: DC

## 2018-03-10 MED ORDER — HEPARIN BOLUS VIA INFUSION
2000.0000 [IU] | Freq: Once | INTRAVENOUS | Status: AC
  Administered 2018-03-10: 2000 [IU] via INTRAVENOUS
  Filled 2018-03-10: qty 2000

## 2018-03-10 MED ORDER — GADOBUTROL 1 MMOL/ML IV SOLN
6.0000 mL | Freq: Once | INTRAVENOUS | Status: AC | PRN
  Administered 2018-03-10: 04:00:00 6 mL via INTRAVENOUS

## 2018-03-10 MED ORDER — HEPARIN BOLUS VIA INFUSION
900.0000 [IU] | Freq: Once | INTRAVENOUS | Status: AC
  Administered 2018-03-10: 01:00:00 900 [IU] via INTRAVENOUS
  Filled 2018-03-10: qty 900

## 2018-03-10 MED ORDER — OXYCODONE-ACETAMINOPHEN 5-325 MG PO TABS
1.0000 | ORAL_TABLET | ORAL | Status: DC | PRN
  Administered 2018-03-10 – 2018-03-13 (×6): 1 via ORAL
  Filled 2018-03-10 (×6): qty 1

## 2018-03-10 MED ORDER — IPRATROPIUM-ALBUTEROL 0.5-2.5 (3) MG/3ML IN SOLN: 3.0000 mL | Freq: Four times a day (QID) | RESPIRATORY_TRACT | Status: DC | PRN

## 2018-03-10 NOTE — Progress Notes (Signed)
[place] for heparin Indication: SVC thrombosis  Allergies  Allergen Reactions  . Penicillins     Patient Measurements: Height: 5\' 9"  (175.3 cm) Weight: 136 lb 3.2 oz (61.8 kg) IBW/kg (Calculated) : 70.7 Heparin Dosing Weight: 62.6 kg  Vital Signs: Temp: 98 F (36.7 C) (10/16 2037) Temp Source: Oral (10/16 2037) BP: 126/82 (10/16 2037) Pulse Rate: 97 (10/16 2037)  Labs: Recent Labs    03/09/18 1051 03/09/18 1426 03/09/18 1608  03/10/18 0708 03/10/18 1440 03/10/18 2020  HGB 12.7*  --   --   --  11.6*  --   --   HCT 40.2  --   --   --  36.5*  --   --   PLT 258  --   --   --  233  --   --   APTT  --   --  34  --   --   --   --   LABPROT  --   --  13.2  --   --   --   --   INR  --   --  1.01  --   --   --   --   HEPARINUNFRC  --   --   --    < > 0.13* 0.38 0.52  CREATININE 0.88  --   --   --  0.95  --   --   TROPONINI  --  <0.03  --   --   --   --   --    < > = values in this interval not displayed.    Estimated Creatinine Clearance: 79.5 mL/min (by C-G formula based on SCr of 0.95 mg/dL).   Medical History: Past Medical History:  Diagnosis Date  . COPD (chronic obstructive pulmonary disease) ([place])   . Lung mass     Assessment: 52 yo male to start on heparin drip for SVC thrombosis. No PTA meds on med rec  Goal of Therapy:  Heparin level 0.3-0.7 units/ml Monitor platelets by anticoagulation protocol: Yes   Plan:  10/16 14:40 HL 0.38 Level is therapeutic. Will continue infusion at 1350 units/hr.  Recheck HL in 6 hours for confirmation. CBC with AM labs per protocol.   10/16: HL @ 2020 = 0.52 Will continue this pt on current rate and recheck HL on 10/17 with AM labs.   Pharmacy will continue to follow.    03/10/2018 9:22 PM

## 2018-03-10 NOTE — Progress Notes (Signed)
  Oncology Nurse Navigator Documentation  Navigator Location: [facility] (03/10/18 1500)   )Navigator Encounter Type: Lobby (03/10/18 1500)   Abnormal Finding Date: 03/09/18 (03/10/18 1500)                 Patient Visit Type: RadOnc (03/10/18 1500) Treatment Phase: CT SIM (03/10/18 1500) Barriers/Navigation Needs: Coordination of Care (03/10/18 1500)   Interventions: Coordination of Care (03/10/18 1500)   Coordination of Care: Appts;Radiology (03/10/18 1500)        Acuity: Level 2 (03/10/18 1500)   Acuity Level 2: Initial guidance, education and coordination as needed;Educational needs;Assistance expediting appointments (03/10/18 1500)  met with patient during CT simulation to introduce to navigator services. Informed pt that will be assisting with expediting appointments to start treatment quickly. All questions answered during visit. Contact info given and encouraged to call with any questions or needs. Pt verbalized understanding.   Time Spent with Patient: 30 (03/10/18 1500)

## 2018-03-10 NOTE — Consult Note (Signed)
NEW PATIENT EVALUATION  Name: Alexander [name]  MRN: [id number]  Date:   03/09/2018     DOB: 10/02/1965   This 52 y.o. 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 provider found  CHIEF COMPLAINT: No chief complaint on file.   DIAGNOSIS: The primary encounter diagnosis was Malignant neoplasm of right lung, unspecified part of lung ([place]). 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 He was seen at [facility] thought that a drug reaction and was discharged. Recently seen in the emergency room again with facial swelling as well as neck swelling and underwent a CT scan showing a 2.1 cm mass in the right upper lobe medially concerning for malignancy. Also multiple collateral veins seen on his CT scan consistent with SVC syndrome. Also had thrombosis of the left brachiocephalic vein.he is scheduled forCT-guided and tissue biopsy tomorrow I been asked to evaluate him on an urgent basis for possible SVC. He is seen today in his hospital room. 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  PLANNED TREATMENT REGIMEN: urgent radiation therapy  PAST MEDICAL HISTORY:  has a past medical history of COPD (chronic obstructive pulmonary disease) ([facility]) and Lung mass.    PAST SURGICAL HISTORY:  Past Surgical History:  Procedure Laterality Date  . none      FAMILY HISTORY: family history is  not on file.  SOCIAL HISTORY:  reports that he has been smoking. He has never used smokeless tobacco. He reports that he drank alcohol. He reports that he has current or past drug history.  ALLERGIES: Penicillins  MEDICATIONS:  Current Facility-Administered Medications  Medication Dose Route Frequency Provider Last Rate Last Dose  . 0.9 %  sodium chloride infusion  250 mL Intravenous PRN Pyreddy, Reatha Harps, MD      . acetaminophen (TYLENOL) tablet 650 mg  650 mg Oral Q6H PRN Pyreddy, Reatha Harps, MD       Or  . acetaminophen (TYLENOL) suppository 650 mg  650 mg Rectal Q6H PRN Pyreddy, Reatha Harps, MD      . heparin ADULT infusion 100 units/mL (25000 units/234mL sodium chloride 0.45%)  1,350 Units/hr Intravenous Continuous Hallaji, Sheema M, RPH 13.5 mL/hr at 03/10/18 1010 1,350 Units/hr at 03/10/18 1010  . HYDROcodone-acetaminophen (NORCO/VICODIN) 5-325 MG per tablet 1-2 tablet  1-2 tablet Oral Q4H PRN Saundra Shelling, MD   1 tablet at 03/09/18 2341  . ipratropium-albuterol (DUONEB) 0.5-2.5 (3) MG/3ML nebulizer solution 3 mL  3 mL Nebulization Q6H PRN Wieting, Richard, MD      . senna-docusate (Senokot-S) tablet 1 tablet  1 tablet Oral QHS PRN Pyreddy, Reatha Harps, MD      . sodium chloride flush (NS) 0.9 % injection 3 mL  3 mL Intravenous Q12H Pyreddy, Reatha Harps, MD   3 mL at 03/10/18 0818  . sodium chloride flush (NS) 0.9 % injection 3 mL  3 mL Intravenous PRN Pyreddy, Reatha Harps, MD        ECOG PERFORMANCE STATUS:  1 - Symptomatic but completely ambulatory  REVIEW OF SYSTEMS: except for the facial plethora Patient denies any weight loss, fatigue, weakness, fever, chills or night sweats. Patient denies any loss of vision, blurred vision. Patient denies any ringing  of the ears or hearing loss. No irregular heartbeat. Patient denies heart murmur or history of fainting. Patient denies any chest pain or pain radiating to her upper extremities. Patient denies any shortness of breath, difficulty breathing at night, cough or  hemoptysis. Patient denies any swelling in the lower legs. Patient denies any nausea vomiting, vomiting of blood, or coffee ground material in the vomitus. Patient denies any stomach pain. Patient states has had normal bowel movements no significant constipation or diarrhea. Patient denies any dysuria, hematuria or significant nocturia. Patient denies any problems walking, swelling in the joints or loss of balance. Patient denies any skin changes, loss of hair or loss of weight. Patient denies any excessive worrying or anxiety or significant depression. Patient denies any problems with insomnia. Patient denies excessive thirst, polyuria, polydipsia. Patient denies any swollen glands, patient denies easy bruising or easy bleeding. Patient denies any recent infections, allergies or URI. Patient "s visual fields have not changed significantly in recent time.    PHYSICAL EXAM: BP 129/83 (BP Location: Right Arm)   Pulse 88   Temp 98.2 F (36.8 C) (Oral)   Resp 14   Ht 5\' 9"  (1.753 m)   Wt 136 lb 3.2 oz (61.8 kg)   SpO2 94%   BMI 20.11 kg/m  Patient does have obvious facial plethora and venous jugular distention. No collateral vessels on a superficial chest are noted.Well-developed well-nourished patient in NAD. HEENT reveals PERLA, EOMI, discs not visualized.  Oral cavity is clear. No oral mucosal lesions are identified. Neck is clear without evidence of cervical or supraclavicular adenopathy. Lungs are clear to A&P. Cardiac examination is essentially unremarkable with regular rate and rhythm without murmur rub or thrill. Abdomen is benign with no organomegaly or masses noted. Motor sensory and DTR levels are equal and symmetric in the upper and lower extremities. Cranial nerves II through XII are grossly intact. Proprioception is intact. No peripheral adenopathy or edema is identified. No motor or sensory levels are noted. Crude visual fields are within normal range.  LABORATORY DATA: pathology  pending    RADIOLOGY RESULTS:CT scans reviewed and compatible above-stated findings   IMPRESSION: SVC from probable non-small cell lung cancer in 52 year old male  PLAN: at this time patient's pathology is pending. I'm going to have and simulate the patient today for radiation therapy to his chest. Would plan on delivering 4000 cGy over 4 weeks in evaluating for response. Risks and benefits of radiation include possible dysphasia secondary radiation esophagitis fatigue alteration of blood counts cough all were discussed in detail. I plan on starting his treatments this Friday on an urgent basis and will continue thereafter as prescribed above. Patient Has my treatment plan well. Will make further recommendations along with rate medical oncology after pathology is obtained.brain imaging is within normal limits.  I would like to take this opportunity to thank you for allowing me to participate in the care of your patient.Noreene Filbert, MD

## 2018-03-10 NOTE — Progress Notes (Signed)
ANTICOAGULATION CONSULT NOTE - Initial Consult  Pharmacy Consult for heparin Indication: SVC thrombosis  Allergies  Allergen Reactions  . Penicillins     Patient Measurements: Height: 5\' 9"  (175.3 cm) Weight: 136 lb 3.2 oz (61.8 kg) IBW/kg (Calculated) : 70.7 Heparin Dosing Weight: 62.6 kg  Vital Signs: Temp: 97.4 F (36.3 C) (10/16 0337) Temp Source: Oral (10/16 0337) BP: 109/78 (10/16 0337) Pulse Rate: 83 (10/16 0337)  Labs: Recent Labs    03/09/18 1051 03/09/18 1426 03/09/18 1608 03/09/18 2311 03/10/18 0708  HGB 12.7*  --   --   --  11.6*  HCT 40.2  --   --   --  36.5*  PLT 258  --   --   --  233  APTT  --   --  34  --   --   LABPROT  --   --  13.2  --   --   INR  --   --  1.01  --   --   HEPARINUNFRC  --   --   --  0.25* 0.13*  CREATININE 0.88  --   --   --  0.95  TROPONINI  --  <0.03  --   --   --     Estimated Creatinine Clearance: 79.5 mL/min (by C-G formula based on SCr of 0.95 mg/dL).   Medical History: Past Medical History:  Diagnosis Date  . COPD (chronic obstructive pulmonary disease) ([place])   . Lung mass     Assessment: 52 yo male to start on heparin drip for SVC thrombosis. No PTA meds on med rec  Goal of Therapy:  Heparin level 0.3-0.7 units/ml Monitor platelets by anticoagulation protocol: Yes   Plan:  10/16 HL 0.13. Level is subtherapeutic. Will order 2000 unit bolus and increase infusion to 1350 units/hr.  Recheck HL in 6 hours. CBC with AM labs per protocol.   Pharmacy will continue to follow.   Pernell Dupre, PharmD, BCPS Clinical Pharmacist 03/10/2018 7:56 AM

## 2018-03-10 NOTE — Consult Note (Signed)
Reason for Consult: Lung mass, SVC syndrome. Referring Physician: Rao,A.  Alexander [name] is an 52 y.o. male.  HPI: The patient is a 52 year old current smoker (60 pack years) who has had issues with dyspnea for approximately two to three months and neck and facial swelling since approximately mid August. He was initially diagnosed with COPD and subsequently diagnosed with a potential allergic reaction to medications given for COPD. This was done at an emergency room at a different facility. [facility]. I did a preliminary assessment in the emergency room yesterday. A CT scan of the chest shows that he has clot in the superior vena cava and there is evidence of a right para tracheal, right upper lobe and right hilar masses. 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 The patient has been admitted for IV anticoagulation. He will require biopsy for definitive diagnosis. The patient is aware that he will need to have endobronchial ultrasound (EBUS) which will need to be done under general anesthesia. This procedure is scheduled for 10 AM tomorrow, October 17.   Aside from dyspnea, fatigue and the aforementioned swelling patient does not endorse any other symptoms. Notably he has not had any cough, sputum production or hemoptysis. He has not had any weight loss or anorexia. Does not endorse any chest pain though he does have some comfort on his right upper quadrant that has been there "for some time". He hasn't had any fevers, chills or sweats. Does not endorse any wheezing.  I have reviewed the patient's films with him was able to ask questions and these where answered to his satisfaction. I discussed with the patient that the best option to make a diagnosis is to perform bronchoscopy with endobronchial ultrasound and sample the right hilar mass.  Past Medical History:  Diagnosis Date  . COPD (chronic obstructive  pulmonary disease) ([place])   . Lung mass     Past Surgical History:  Procedure Laterality Date  . none      No family history on file.  Social History:  reports that he has been smoking. He has never used smokeless tobacco. He reports that he drank alcohol. He reports that he has current or past drug history. The patient has smoked two packs of cigarettes per day for approximately 30 years. He is employed as a Cabin [employer]. Currently does not endorse any illicit drug use. Does not imbibe alcohol to excess.   Allergies:  Allergies  Allergen Reactions  . Penicillins     Medications: I have reviewed the patient's current medications.  Results for orders placed or performed during the hospital encounter of 03/09/18 (from the past 48 hour(s))  CBC     Status: Abnormal   Collection Time: 03/09/18 10:51 AM  Result Value Ref Range   WBC 10.0 4.0 - 10.5 K/uL   RBC 4.46 4.22 - 5.81 MIL/uL   Hemoglobin 12.7 (L) 13.0 - 17.0 g/dL   HCT 40.2 39.0 - 52.0 %   MCV 90.1 80.0 - 100.0 fL   MCH 28.5 26.0 - 34.0 pg   MCHC 31.6 30.0 - 36.0 g/dL   RDW 13.2 11.5 - 15.5 %   Platelets 258 150 - 400 K/uL   nRBC 0.0 0.0 - 0.2 %    Comment: Performed at [facility], [address], [place], [place] [address]  Comprehensive metabolic panel     Status: Abnormal   Collection Time: 03/09/18 10:51 AM  Result Value Ref Range   Sodium 140 135 - 145 mmol/L   Potassium 4.2 3.5 - 5.1 mmol/L   Chloride 101 98 - 111 mmol/L   CO2 28 22 - 32 mmol/L   Glucose, Bld 103 (H) 70 - 99 mg/dL   BUN 10 6 - 20 mg/dL   Creatinine, Ser 0.88 0.61 - 1.24 mg/dL   Calcium 9.1 8.9 - 10.3 mg/dL   Total Protein 7.6 6.5 - 8.1 g/dL   Albumin 4.0 3.5 - 5.0 g/dL   AST 14 (L) 15 - 41 U/L   ALT 9 0 - 44 U/L   Alkaline Phosphatase 103 38 - 126 U/L   Total Bilirubin 0.5 0.3 - 1.2 mg/dL   GFR calc non Af Amer >60 >60 mL/min   GFR calc Af Amer >60 >60 mL/min    Comment: (NOTE) The eGFR has been calculated using the CKD  EPI equation. This calculation has not been validated in all clinical situations. eGFR's persistently <60 mL/min signify possible Chronic Kidney Disease.    Anion gap 11 5 - 15    Comment: Performed at [facility], [address] [place], [place] [address]  TSH     Status: None   Collection Time: 03/09/18  2:26 PM  Result Value Ref Range   TSH 1.759 0.350 - 4.500 uIU/mL    Comment: Performed by a 3rd Generation assay with a functional sensitivity of <=0.01 uIU/mL. Performed at [facility], [address] [place], [place] [address]   T4, free     Status: None   Collection Time: 03/09/18  2:26 PM  Result Value Ref Range   Free T4 1.02 0.82 - 1.77 ng/dL    Comment: (NOTE) Biotin ingestion may interfere with free T4 tests. If the results are inconsistent with the TSH level, previous test results, or the clinical presentation, then consider biotin interference. If needed, order repeat testing after stopping biotin. Performed at [facility], [address] [place], [place] [address]   Troponin I     Status: None   Collection Time: 03/09/18  2:26 PM  Result Value Ref Range   Troponin I <0.03 <0.03 ng/mL    Comment: Performed at [facility], [address] [place], [place] [address]  Protime-INR     Status: None   Collection Time: 03/09/18  4:08 PM  Result Value Ref Range   Prothrombin Time 13.2 11.4 - 15.2 seconds   INR 1.01     Comment: Performed at [facility], [address] [place], [place] [address]  APTT     Status: None   Collection Time: 03/09/18  4:08 PM  Result Value Ref Range   aPTT 34 24 - 36 seconds    Comment: Performed at [facility], [place], [place] [address]  Heparin level (unfractionated)     Status: Abnormal   Collection Time: 03/09/18 11:11 PM  Result Value Ref Range   Heparin Unfractionated 0.25 (L) 0.30 - 0.70 IU/mL    Comment: (NOTE) If heparin results are below  expected values, and patient dosage has  been confirmed, suggest follow up testing of antithrombin III levels. Performed at [facility], [address] [place],  [address]   CBC     Status: Abnormal   Collection Time: 03/10/18  7:08 AM  Result Value Ref Range   WBC 7.6 4.0 - 10.5 K/uL   RBC 4.06 (L) 4.22 - 5.81 MIL/uL  Hemoglobin 11.6 (L) 13.0 - 17.0 g/dL   HCT 36.5 (L) 39.0 - 52.0 %   MCV 89.9 80.0 - 100.0 fL   MCH 28.6 26.0 - 34.0 pg   MCHC 31.8 30.0 - 36.0 g/dL   RDW 13.1 11.5 - 15.5 %   Platelets 233 150 - 400 K/uL   nRBC 0.0 0.0 - 0.2 %    Comment: Performed at [facility], [address] [place], [place] [address]  Basic metabolic panel     Status: None   Collection Time: 03/10/18  7:08 AM  Result Value Ref Range   Sodium 139 135 - 145 mmol/L   Potassium 3.9 3.5 - 5.1 mmol/L   Chloride 102 98 - 111 mmol/L   CO2 28 22 - 32 mmol/L   Glucose, Bld 95 70 - 99 mg/dL   BUN 10 6 - 20 mg/dL   Creatinine, Ser 0.95 0.61 - 1.24 mg/dL   Calcium 9.0 8.9 - 10.3 mg/dL   GFR calc non Af Amer >60 >60 mL/min   GFR calc Af Amer >60 >60 mL/min    Comment: (NOTE) The eGFR has been calculated using the CKD EPI equation. This calculation has not been validated in all clinical situations. eGFR's persistently <60 mL/min signify possible Chronic Kidney Disease.    Anion gap 9 5 - 15    Comment: Performed at [facility], [place], [place] [address]  Heparin level (unfractionated)     Status: Abnormal   Collection Time: 03/10/18  7:08 AM  Result Value Ref Range   Heparin Unfractionated 0.13 (L) 0.30 - 0.70 IU/mL    Comment: (NOTE) If heparin results are below expected values, and patient dosage has  been confirmed, suggest follow up testing of antithrombin III levels. Performed at [facility], [address] [place], [place] [address]     Ct Chest W Contrast  Result Date: 03/09/2018 CLINICAL DATA:  Facial and neck  swelling for several months. Abdominal pain. EXAM: CT CHEST, ABDOMEN, AND PELVIS WITH CONTRAST TECHNIQUE: Multidetector CT imaging of the chest, abdomen and pelvis was performed following the standard protocol during bolus administration of intravenous contrast. CONTRAST:  176m ISOVUE-300 IOPAMIDOL (ISOVUE-300) INJECTION 61% COMPARISON:  None. FINDINGS: CT CHEST FINDINGS Cardiovascular: There is no evidence of thoracic aortic dissection or aneurysm. Normal cardiac size. No pericardial effusion is noted. 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 Small amount of contrast is seen flowing into the right atrium. This results in multiple collateral veins. Probable thrombotic occlusion of left brachiocephalic vein is noted as well. Mediastinum/Nodes: Previously described SVC thrombosis and severe narrowing appears to be due to ill-defined soft tissue mass in right paratracheal and precarinal area measuring 3.2 x 2.3 cm, as well as right hilar mass or node measuring 2.2 x 1.7 cm. Thyroid gland is unremarkable. Esophagus is unremarkable. Lungs/Pleura: No pneumothorax or pleural effusion is noted. Emphysematous disease is noted in both upper lobes. 2.1 x 2.0 cm irregular mass is noted medially in the right upper lobe concerning for malignancy. Also noted is spiculated density measuring 12 x 9 mm with pleural tail in right upper lobe concerning for possible malignancy. Musculoskeletal: No chest wall mass or suspicious bone lesions identified. CT ABDOMEN PELVIS FINDINGS Hepatobiliary: No focal liver abnormality is seen. No gallstones, gallbladder wall thickening, or biliary dilatation. Pancreas: Unremarkable. No pancreatic ductal dilatation or surrounding inflammatory changes. Spleen: Normal in size without focal abnormality. Adrenals/Urinary Tract: Adrenal glands  are unremarkable. Kidneys are normal, without renal calculi, focal lesion, or hydronephrosis. Bladder is unremarkable. Stomach/Bowel:  The stomach appears normal. The appendix is not visualized. There is no evidence of bowel obstruction or inflammation. Vascular/Lymphatic: Atherosclerosis of abdominal aorta is noted without aneurysm or dissection. IVC and iliac veins appear to be patent. However, collateral veins are noted in the anterior subcutaneous tissues of the abdomen. No significant adenopathy is noted in the abdomen or pelvis. Reproductive: Prostate is unremarkable. Other: No abdominal wall hernia or abnormality. No abdominopelvic ascites. Musculoskeletal: No acute or significant osseous findings. IMPRESSION: 2.1 x 2.0 cm irregular mass is noted medially in right upper lobe concerning for malignancy. Also noted is probable right paratracheal, right hilar and precarinal adenopathy or malignancy, which results in severe narrowing and thrombosis of the superior vena cava. This results in multiple collateral veins in the chest and abdomen, as well as probable thrombosis of the left brachycephalic vein. This is consistent with SVC syndrome. PET scan is recommended for further evaluation. Aortic Atherosclerosis (ICD10-I70.0) and Emphysema (ICD10-J43.9). Electronically Signed   By: Marijo Conception, M.D.   On: 03/09/2018 15:19   Mr Jeri Cos NK Contrast  Result Date: 03/10/2018 CLINICAL DATA:  52 y/o  M; lung cancer for staging. EXAM: MRI HEAD WITHOUT AND WITH CONTRAST TECHNIQUE: Multiplanar, multiecho pulse sequences of the brain and surrounding structures were obtained without and with intravenous contrast. CONTRAST:  6 cc Gadavist COMPARISON:  03/09/2018 CT of chest, abdomen, and pelvis. FINDINGS: Brain: No acute infarction, hemorrhage, hydrocephalus, extra-axial collection or mass lesion. Few nonspecific T2 FLAIR hyperintensities in subcortical and periventricular white matter are compatible with mild chronic microvascular ischemic changes for age. No abnormal enhancement. Vascular: Normal flow voids. Skull and upper cervical spine: Normal  marrow signal. Sinuses/Orbits: Moderate mucosal thickening of paranasal sinuses. No abnormal signal of mastoid air cells. Orbits are unremarkable. Other: None. IMPRESSION: 1. No intracranial metastatic disease identified. 2. Mild chronic microvascular ischemic changes of the brain. 3. Moderate paranasal sinus mucosal thickening. Electronically Signed   By: Kristine Garbe M.D.   On: 03/10/2018 03:58   Ct Abdomen Pelvis W Contrast  Result Date: 03/09/2018 CLINICAL DATA:  Facial and neck swelling for several months. Abdominal pain. EXAM: CT CHEST, ABDOMEN, AND PELVIS WITH CONTRAST TECHNIQUE: Multidetector CT imaging of the chest, abdomen and pelvis was performed following the standard protocol during bolus administration of intravenous contrast. CONTRAST:  151m ISOVUE-300 IOPAMIDOL (ISOVUE-300) INJECTION 61% COMPARISON:  None. FINDINGS: CT CHEST FINDINGS Cardiovascular: There is no evidence of thoracic aortic dissection or aneurysm. Normal cardiac size. No pericardial effusion is noted. 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 Small amount of contrast is seen flowing into the right atrium. This results in multiple collateral veins. Probable thrombotic occlusion of left brachiocephalic vein is noted as well. Mediastinum/Nodes: Previously described SVC thrombosis and severe narrowing appears to be due to ill-defined soft tissue mass in right paratracheal and precarinal area measuring 3.2 x 2.3 cm, as well as right hilar mass or node measuring 2.2 x 1.7 cm. Thyroid gland is unremarkable. Esophagus is unremarkable. Lungs/Pleura: No pneumothorax or pleural effusion is noted. Emphysematous disease is noted in both upper lobes. 2.1 x 2.0 cm irregular mass is noted medially in the right upper lobe concerning for malignancy. Also noted is spiculated density measuring 12 x 9 mm with pleural tail in right upper lobe concerning for possible malignancy. Musculoskeletal: No chest wall  mass or suspicious bone lesions identified.  CT ABDOMEN PELVIS FINDINGS Hepatobiliary: No focal liver abnormality is seen. No gallstones, gallbladder wall thickening, or biliary dilatation. Pancreas: Unremarkable. No pancreatic ductal dilatation or surrounding inflammatory changes. Spleen: Normal in size without focal abnormality. Adrenals/Urinary Tract: Adrenal glands are unremarkable. Kidneys are normal, without renal calculi, focal lesion, or hydronephrosis. Bladder is unremarkable. Stomach/Bowel: The stomach appears normal. The appendix is not visualized. There is no evidence of bowel obstruction or inflammation. Vascular/Lymphatic: Atherosclerosis of abdominal aorta is noted without aneurysm or dissection. IVC and iliac veins appear to be patent. However, collateral veins are noted in the anterior subcutaneous tissues of the abdomen. No significant adenopathy is noted in the abdomen or pelvis. Reproductive: Prostate is unremarkable. Other: No abdominal wall hernia or abnormality. No abdominopelvic ascites. Musculoskeletal: No acute or significant osseous findings. IMPRESSION: 2.1 x 2.0 cm irregular mass is noted medially in right upper lobe concerning for malignancy. Also noted is probable right paratracheal, right hilar and precarinal adenopathy or malignancy, which results in severe narrowing and thrombosis of the superior vena cava. This results in multiple collateral veins in the chest and abdomen, as well as probable thrombosis of the left brachycephalic vein. This is consistent with SVC syndrome. PET scan is recommended for further evaluation. Aortic Atherosclerosis (ICD10-I70.0) and Emphysema (ICD10-J43.9). Electronically Signed   By: Marijo Conception, M.D.   On: 03/09/2018 15:19   I have reviewed all of the above imaging and laboratory data independently.   Review of Systems  Constitutional: Positive for malaise/fatigue (Fatigue).  HENT:       Has noted facial/periorbital swelling  Eyes:        Periorbital swelling  Respiratory: Positive for shortness of breath. Negative for cough, hemoptysis, sputum production and wheezing.        Dyspnea on exertion  Cardiovascular: Negative.   Gastrointestinal: Negative.   Genitourinary: Negative.   Musculoskeletal: Negative.   Skin: Negative for itching and rash.  Neurological: Negative.   Endo/Heme/Allergies: Negative.   Psychiatric/Behavioral: Negative.   All other systems reviewed and are negative.  Blood pressure 129/83, pulse 88, temperature 98.2 F (36.8 C), temperature source Oral, resp. rate 14, height 5' 9"  (1.753 m), weight 61.8 kg, SpO2 94 %.   Physical Exam  Nursing note and vitals reviewed. Constitutional: He is oriented to person, place, and time. He appears well-developed and well-nourished. No distress.  HENT:  Head: Normocephalic and atraumatic.  Right Ear: External ear normal.  Left Ear: External ear normal.  Mouth/Throat: Oropharynx is clear and moist.  Airway Mallampati I. Patient has very orbital edema and facial edema.  Eyes: Pupils are equal, round, and reactive to light. Conjunctivae are normal. No scleral icterus.  No Horner's  Neck: Neck supple. JVD (Prominent neck veins consistent with SVC syndrome.) present. No tracheal deviation present. No thyromegaly present.  No supraclavicular adenopathy  Cardiovascular: Normal rate, regular rhythm, normal heart sounds and intact distal pulses. Exam reveals no gallop and no friction rub.  No murmur heard. Respiratory: Effort normal. No respiratory distress. He has no wheezes. He has no rales. He exhibits no tenderness.  Breath sounds somewhat distant.  GI: Soft. Bowel sounds are normal. He exhibits no distension. There is no tenderness.  Musculoskeletal: He exhibits edema (Right arm shows some edema compared to left).  Lymphadenopathy:    He has no cervical adenopathy.  Neurological: He is alert and oriented to person, place, and time.  No focal deficits.  Skin:  Skin is warm and dry. No rash noted. He is  not diaphoretic. No erythema. No pallor.  Psychiatric: He has a normal mood and affect. His behavior is normal. Judgment and thought content normal.    Assessment/Plan:  1) Right hilar mass: carcinoma until proven otherwise. Patient will need diagnosis established so that he can get appropriate therapy. The best procedure in this case would be bronchoscopy with endobronchial ultrasound and TBNA (trans bronchial needle aspirate). This will need to be done under general anesthesia. The patient understands the potential benefits, limitations and complications of the procedure and agrees to go ahead. He has had the procedure explained in detail and he has had opportunity to ask questions. The patient is agreeable with the plan of care. The patient will need to be NPO after midnight tonight and heparin infusion will need to be held two hours prior to the procedure in the morning, I have discussed this with the pharmacist. The procedure is scheduled for tomorrow (17 October) at 10 AM. This will be done in the bronchoscopy procedure room in the OR.  2). Superior vena cava (SVC) syndrome: patient is currently on anticoagulation with a heparin infusion. This will be held for two hours prior to the procedure posted above. Will resume infusion once safe from the procedure standpoint.  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  4) Tobacco abuse and dependence, patient has resolved to discontinue this habit.   Thank you for allowing [facility] to participate in this patient's care  I discussed the case with Dr. Janese Banks - Oncology and with Dr. Leslye Peer - Hospitalist.       Vernard Gambles 03/10/2018, 1:33 PM

## 2018-03-10 NOTE — Progress Notes (Signed)
Patient ID: Alexander [name], male   DOB: 12/18/1965, 52 y.o.   MRN: [id number]  [facility] PROGRESS NOTE  Alexander [name] AJO:[id number] DOB: [birth year] DOA: 03/09/2018 PCP: Patient, No Pcp Per  HPI/Subjective: Patient feeling some swelling in his arm and his right arm falling asleep and some chest soreness.  Some cough.  Had previous weight loss prior to entering the hospital.  Objective: Vitals:   03/10/18 0337 03/10/18 0820  BP: 109/78 129/83  Pulse: 83 88  Resp: 15 14  Temp: (!) 97.4 F (36.3 C) 98.2 F (36.8 C)  SpO2: 96% 94%    Filed Weights   03/09/18 1044 03/09/18 1818  Weight: 62.6 kg 61.8 kg    ROS: Review of Systems  Constitutional: Negative for chills and fever.  Eyes: Negative for blurred vision.  Respiratory: Positive for cough and shortness of breath.   Cardiovascular: Positive for chest pain.  Gastrointestinal: Negative for abdominal pain, constipation, diarrhea, nausea and vomiting.  Genitourinary: Negative for dysuria.  Musculoskeletal: Negative for joint pain.  Neurological: Negative for dizziness and headaches.   Exam: Physical Exam  Constitutional: He is oriented to person, place, and time.  HENT:  Nose: No mucosal edema.  Mouth/Throat: No oropharyngeal exudate or posterior oropharyngeal edema.  Eyes: Pupils are equal, round, and reactive to light. Conjunctivae, EOM and lids are normal.  Neck: No JVD present. Carotid bruit is not present. No edema present. No thyroid mass and no thyromegaly present.  Cardiovascular: S1 normal and S2 normal. Exam reveals no gallop.  No murmur heard. Pulses:      Dorsalis pedis pulses are 2+ on the right side, and 2+ on the left side.  Respiratory: No respiratory distress. He has no wheezes. He has no rhonchi. He has no rales.  GI: Soft. Bowel sounds are normal. There is no tenderness.  Musculoskeletal:       Right shoulder: He exhibits swelling.       Right elbow: He exhibits swelling.       Right wrist: He  exhibits tenderness.       Left wrist: He exhibits tenderness.  Lymphadenopathy:    He has no cervical adenopathy.  Neurological: He is alert and oriented to person, place, and time. No cranial nerve deficit.  Skin: Skin is warm. No rash noted. Nails show no clubbing.  Psychiatric: He has a normal mood and affect.      Data Reviewed: Basic Metabolic Panel: Recent Labs  Lab 03/09/18 1051 03/10/18 0708  NA 140 139  K 4.2 3.9  CL 101 102  CO2 28 28  GLUCOSE 103* 95  BUN 10 10  CREATININE 0.88 0.95  CALCIUM 9.1 9.0   Liver Function Tests: Recent Labs  Lab 03/09/18 1051  AST 14*  ALT 9  ALKPHOS 103  BILITOT 0.5  PROT 7.6  ALBUMIN 4.0   CBC: Recent Labs  Lab 03/09/18 1051 03/10/18 0708  WBC 10.0 7.6  HGB 12.7* 11.6*  HCT 40.2 36.5*  MCV 90.1 89.9  PLT 258 233   Cardiac Enzymes: Recent Labs  Lab 03/09/18 1426  TROPONINI <0.03     Studies: Ct Chest W Contrast  Result Date: 03/09/2018 CLINICAL DATA:  Facial and neck swelling for several months. Abdominal pain. EXAM: CT CHEST, ABDOMEN, AND PELVIS WITH CONTRAST TECHNIQUE: Multidetector CT imaging of the chest, abdomen and pelvis was performed following the standard protocol during bolus administration of intravenous contrast. CONTRAST:  125mL ISOVUE-300 IOPAMIDOL (ISOVUE-300) INJECTION 61% COMPARISON:  None. FINDINGS: CT CHEST  FINDINGS Cardiovascular: There is no evidence of thoracic aortic dissection or aneurysm. Normal cardiac size. No pericardial effusion is noted. 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 Small amount of contrast is seen flowing into the right atrium. This results in multiple collateral veins. Probable thrombotic occlusion of left brachiocephalic vein is noted as well. Mediastinum/Nodes: Previously described SVC thrombosis and severe narrowing appears to be due to ill-defined soft tissue mass in right paratracheal and precarinal area measuring 3.2 x 2.3 cm, as well  as right hilar mass or node measuring 2.2 x 1.7 cm. Thyroid gland is unremarkable. Esophagus is unremarkable. Lungs/Pleura: No pneumothorax or pleural effusion is noted. Emphysematous disease is noted in both upper lobes. 2.1 x 2.0 cm irregular mass is noted medially in the right upper lobe concerning for malignancy. Also noted is spiculated density measuring 12 x 9 mm with pleural tail in right upper lobe concerning for possible malignancy. Musculoskeletal: No chest wall mass or suspicious bone lesions identified. CT ABDOMEN PELVIS FINDINGS Hepatobiliary: No focal liver abnormality is seen. No gallstones, gallbladder wall thickening, or biliary dilatation. Pancreas: Unremarkable. No pancreatic ductal dilatation or surrounding inflammatory changes. Spleen: Normal in size without focal abnormality. Adrenals/Urinary Tract: Adrenal glands are unremarkable. Kidneys are normal, without renal calculi, focal lesion, or hydronephrosis. Bladder is unremarkable. Stomach/Bowel: The stomach appears normal. The appendix is not visualized. There is no evidence of bowel obstruction or inflammation. Vascular/Lymphatic: Atherosclerosis of abdominal aorta is noted without aneurysm or dissection. IVC and iliac veins appear to be patent. However, collateral veins are noted in the anterior subcutaneous tissues of the abdomen. No significant adenopathy is noted in the abdomen or pelvis. Reproductive: Prostate is unremarkable. Other: No abdominal wall hernia or abnormality. No abdominopelvic ascites. Musculoskeletal: No acute or significant osseous findings. IMPRESSION: 2.1 x 2.0 cm irregular mass is noted medially in right upper lobe concerning for malignancy. Also noted is probable right paratracheal, right hilar and precarinal adenopathy or malignancy, which results in severe narrowing and thrombosis of the superior vena cava. This results in multiple collateral veins in the chest and abdomen, as well as probable thrombosis of the left  brachycephalic vein. This is consistent with SVC syndrome. PET scan is recommended for further evaluation. Aortic Atherosclerosis (ICD10-I70.0) and Emphysema (ICD10-J43.9). Electronically Signed   By: Marijo Conception, M.D.   On: 03/09/2018 15:19   Mr Jeri Cos PX Contrast  Result Date: 03/10/2018 CLINICAL DATA:  52 y/o  M; lung cancer for staging. EXAM: MRI HEAD WITHOUT AND WITH CONTRAST TECHNIQUE: Multiplanar, multiecho pulse sequences of the brain and surrounding structures were obtained without and with intravenous contrast. CONTRAST:  6 cc Gadavist COMPARISON:  03/09/2018 CT of chest, abdomen, and pelvis. FINDINGS: Brain: No acute infarction, hemorrhage, hydrocephalus, extra-axial collection or mass lesion. Few nonspecific T2 FLAIR hyperintensities in subcortical and periventricular white matter are compatible with mild chronic microvascular ischemic changes for age. No abnormal enhancement. Vascular: Normal flow voids. Skull and upper cervical spine: Normal marrow signal. Sinuses/Orbits: Moderate mucosal thickening of paranasal sinuses. No abnormal signal of mastoid air cells. Orbits are unremarkable. Other: None. IMPRESSION: 1. No intracranial metastatic disease identified. 2. Mild chronic microvascular ischemic changes of the brain. 3. Moderate paranasal sinus mucosal thickening. Electronically Signed   By: Kristine Garbe M.D.   On: 03/10/2018 03:58   Ct Abdomen Pelvis W Contrast  Result Date: 03/09/2018 CLINICAL DATA:  Facial and neck swelling for several months. Abdominal pain. EXAM: CT CHEST, ABDOMEN, AND  PELVIS WITH CONTRAST TECHNIQUE: Multidetector CT imaging of the chest, abdomen and pelvis was performed following the standard protocol during bolus administration of intravenous contrast. CONTRAST:  172mL ISOVUE-300 IOPAMIDOL (ISOVUE-300) INJECTION 61% COMPARISON:  None. FINDINGS: CT CHEST FINDINGS Cardiovascular: There is no evidence of thoracic aortic dissection or aneurysm. Normal  cardiac size. No pericardial effusion is noted. 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 Small amount of contrast is seen flowing into the right atrium. This results in multiple collateral veins. Probable thrombotic occlusion of left brachiocephalic vein is noted as well. Mediastinum/Nodes: Previously described SVC thrombosis and severe narrowing appears to be due to ill-defined soft tissue mass in right paratracheal and precarinal area measuring 3.2 x 2.3 cm, as well as right hilar mass or node measuring 2.2 x 1.7 cm. Thyroid gland is unremarkable. Esophagus is unremarkable. Lungs/Pleura: No pneumothorax or pleural effusion is noted. Emphysematous disease is noted in both upper lobes. 2.1 x 2.0 cm irregular mass is noted medially in the right upper lobe concerning for malignancy. Also noted is spiculated density measuring 12 x 9 mm with pleural tail in right upper lobe concerning for possible malignancy. Musculoskeletal: No chest wall mass or suspicious bone lesions identified. CT ABDOMEN PELVIS FINDINGS Hepatobiliary: No focal liver abnormality is seen. No gallstones, gallbladder wall thickening, or biliary dilatation. Pancreas: Unremarkable. No pancreatic ductal dilatation or surrounding inflammatory changes. Spleen: Normal in size without focal abnormality. Adrenals/Urinary Tract: Adrenal glands are unremarkable. Kidneys are normal, without renal calculi, focal lesion, or hydronephrosis. Bladder is unremarkable. Stomach/Bowel: The stomach appears normal. The appendix is not visualized. There is no evidence of bowel obstruction or inflammation. Vascular/Lymphatic: Atherosclerosis of abdominal aorta is noted without aneurysm or dissection. IVC and iliac veins appear to be patent. However, collateral veins are noted in the anterior subcutaneous tissues of the abdomen. No significant adenopathy is noted in the abdomen or pelvis. Reproductive: Prostate is unremarkable. Other: No  abdominal wall hernia or abnormality. No abdominopelvic ascites. Musculoskeletal: No acute or significant osseous findings. IMPRESSION: 2.1 x 2.0 cm irregular mass is noted medially in right upper lobe concerning for malignancy. Also noted is probable right paratracheal, right hilar and precarinal adenopathy or malignancy, which results in severe narrowing and thrombosis of the superior vena cava. This results in multiple collateral veins in the chest and abdomen, as well as probable thrombosis of the left brachycephalic vein. This is consistent with SVC syndrome. PET scan is recommended for further evaluation. Aortic Atherosclerosis (ICD10-I70.0) and Emphysema (ICD10-J43.9). Electronically Signed   By: Marijo Conception, M.D.   On: 03/09/2018 15:19    Scheduled Meds: . sodium chloride flush  3 mL Intravenous Q12H   Continuous Infusions: . sodium chloride    . heparin 1,350 Units/hr (03/10/18 1500)    Assessment/Plan:  1. SVC syndrome.  Radiation oncology consultation. 2. 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  On heparin drip. 3. Lung nodules concerning for malignancy.  Patient to have a bronchoscopy tomorrow.  Heparin drip will be stopped before the procedure. 4. Tobacco abuse.  Patient refused nicotine patch  Code Status:     Code Status Orders  (From admission, onward)         Start     Ordered   03/09/18 1812  Full code  Continuous     03/09/18 1811        Code Status History    This patient has a current code status but no historical code status.  Disposition Plan: To be determined based on clinical course  Consultants:  Oncology  Radiation oncology  Pulmonology  Time spent: 28 minutes  [place]

## 2018-03-10 NOTE — Progress Notes (Signed)
[place] for heparin Indication: SVC thrombosis  Allergies  Allergen Reactions  . Penicillins     Patient Measurements: Height: 5\' 9"  (175.3 cm) Weight: 136 lb 3.2 oz (61.8 kg) IBW/kg (Calculated) : 70.7 Heparin Dosing Weight: 62.6 kg  Vital Signs: Temp: 98.2 F (36.8 C) (10/16 0820) Temp Source: Oral (10/16 0820) BP: 129/83 (10/16 0820) Pulse Rate: 88 (10/16 0820)  Labs: Recent Labs    03/09/18 1051 03/09/18 1426 03/09/18 1608 03/09/18 2311 03/10/18 0708 03/10/18 1440  HGB 12.7*  --   --   --  11.6*  --   HCT 40.2  --   --   --  36.5*  --   PLT 258  --   --   --  233  --   APTT  --   --  34  --   --   --   LABPROT  --   --  13.2  --   --   --   INR  --   --  1.01  --   --   --   HEPARINUNFRC  --   --   --  0.25* 0.13* 0.38  CREATININE 0.88  --   --   --  0.95  --   TROPONINI  --  <0.03  --   --   --   --     Estimated Creatinine Clearance: 79.5 mL/min (by C-G formula based on SCr of 0.95 mg/dL).   Medical History: Past Medical History:  Diagnosis Date  . COPD (chronic obstructive pulmonary disease) ([place])   . Lung mass     Assessment: 52 yo male to start on heparin drip for SVC thrombosis. No PTA meds on med rec  Goal of Therapy:  Heparin level 0.3-0.7 units/ml Monitor platelets by anticoagulation protocol: Yes   Plan:  10/16 14:40 HL 0.38 Level is therapeutic. Will continue infusion at 1350 units/hr.  Recheck HL in 6 hours for confirmation. CBC with AM labs per protocol.   Pharmacy will continue to follow.   Paulina Fusi, PharmD, BCPS 03/10/2018 4:29 PM

## 2018-03-10 NOTE — Progress Notes (Signed)
ANTICOAGULATION CONSULT NOTE - Initial Consult  Pharmacy Consult for heparin Indication: SVC thrombosis  Allergies  Allergen Reactions  . Penicillins     Patient Measurements: Height: 5\' 9"  (175.3 cm) Weight: 136 lb 3.2 oz (61.8 kg) IBW/kg (Calculated) : 70.7 Heparin Dosing Weight: 62.6 kg  Vital Signs: Temp: 98 F (36.7 C) (10/15 2043) Temp Source: Oral (10/15 2043) BP: 126/82 (10/15 2043) Pulse Rate: 99 (10/15 2043)  Labs: Recent Labs    03/09/18 1051 03/09/18 1426 03/09/18 1608 03/09/18 2311  HGB 12.7*  --   --   --   HCT 40.2  --   --   --   PLT 258  --   --   --   APTT  --   --  34  --   LABPROT  --   --  13.2  --   INR  --   --  1.01  --   HEPARINUNFRC  --   --   --  0.25*  CREATININE 0.88  --   --   --   TROPONINI  --  <0.03  --   --     Estimated Creatinine Clearance: 85.8 mL/min (by C-G formula based on SCr of 0.88 mg/dL).   Medical History: Past Medical History:  Diagnosis Date  . COPD (chronic obstructive pulmonary disease) ([place])   . Lung mass       Assessment: 51 yo male to start on heparin drip for SVC thrombosis. No PTA meds on med rec  Goal of Therapy:  Heparin level 0.3-0.7 units/ml Monitor platelets by anticoagulation protocol: Yes   Plan:  Heparin 4000 units IV x1 bolus then heparin drip at 1050 units/hr.  Heparin level 6h after start of heparin drip. CBC in AM  10/15 2300 heparin level 0.25. 900 unit bolus and increase rate to 1150 units/hr. Recheck in 6 hours.  Pharmacy will continue to follow.   Kerrigan Glendening S 03/10/2018,12:39 AM

## 2018-03-10 NOTE — Consult Note (Addendum)
[facility] Consult Note  MRN : [id number]  Alexander [name] is a 52 y.o. (Oct 06, 1965) male who presents with chief complaint of No chief complaint on file. Marland Kitchen  History of Present Illness: I am asked to see the patient by Dr. Leslye Peer for SVC occlusion.  The patient is been having head and neck swelling.  This has been chronic without any clear cause or inciting event that started the swelling.  It has become bothersome to him but has not necessarily altered his lifestyle.  No significant arm symptoms.  Has had some shortness of breath but no chest pain.  No fevers or chills.  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  I have independently reviewed the CT scan.  Current Facility-Administered Medications  Medication Dose Route Frequency Provider Last Rate Last Dose  . 0.9 %  sodium chloride infusion  250 mL Intravenous PRN Pyreddy, Reatha Harps, MD      . acetaminophen (TYLENOL) tablet 650 mg  650 mg Oral Q6H PRN Pyreddy, Reatha Harps, MD       Or  . acetaminophen (TYLENOL) suppository 650 mg  650 mg Rectal Q6H PRN Pyreddy, Pavan, MD      . heparin ADULT infusion 100 units/mL (25000 units/261mL sodium chloride 0.45%)  1,350 Units/hr Intravenous Continuous Vira Blanco, RPH      . ipratropium-albuterol (DUONEB) 0.5-2.5 (3) MG/3ML nebulizer solution 3 mL  3 mL Nebulization Q6H PRN Loletha Grayer, MD      . lactated ringers infusion   Intravenous Continuous Tyler Pita, MD 75 mL/hr at 03/11/18 1019    . oxyCODONE-acetaminophen (PERCOCET/ROXICET) 5-325 MG per tablet 1-2 tablet  1-2 tablet Oral Q4H PRN Loletha Grayer, MD   1 tablet at 03/10/18 2042  . senna-docusate (Senokot-S) tablet 1 tablet  1 tablet Oral QHS PRN Pyreddy, Reatha Harps, MD      . sodium chloride flush (NS) 0.9 % injection 3 mL  3 mL Intravenous Q12H Saundra Shelling, MD   3 mL at 03/10/18 2139   . sodium chloride flush (NS) 0.9 % injection 3 mL  3 mL Intravenous PRN Pyreddy, Pavan, MD      . sodium chloride flush 0.9 % injection             Past Medical History:  Diagnosis Date  . COPD (chronic obstructive pulmonary disease) ([place])   . Lung mass     Past Surgical History:  Procedure Laterality Date  . none      Social History Social History   Tobacco Use  . Smoking status: Current Every Day Smoker  . Smokeless tobacco: Never Used  Substance Use Topics  . Alcohol use: Not Currently  . Drug use: Not Currently    Family History No bleeding disorders, clotting disorders, autoimmune diseases, or aneurysms  Allergies  Allergen Reactions  . Penicillins      REVIEW OF SYSTEMS (Negative unless checked)  Constitutional: [] Weight loss  [] Fever  [] Chills Cardiac: [] Chest pain   [] Chest pressure   [] Palpitations   [] Shortness of breath when laying flat   [x] Shortness of breath at rest   [x] Shortness of breath with exertion. Vascular:  [] Pain in legs with walking   [] Pain in legs at rest   [] Pain in legs when laying flat   [] Claudication   [] Pain in feet when walking  [] Pain in feet at rest  [] Pain  in feet when laying flat   [] History of DVT   [] Phlebitis   [] Swelling in legs   [] Varicose veins   [] Non-healing ulcers Pulmonary:   [] Uses home oxygen   [] Productive cough   [] Hemoptysis   [] Wheeze  [x] COPD   [] Asthma Neurologic:  [] Dizziness  [] Blackouts   [] Seizures   [] History of stroke   [] History of TIA  [] Aphasia   [] Temporary blindness   [] Dysphagia   [] Weakness or numbness in arms   [] Weakness or numbness in legs Musculoskeletal:  [] Arthritis   [] Joint swelling   [] Joint pain   [] Low back pain Hematologic:  [] Easy bruising  [] Easy bleeding   [] Hypercoagulable state   [] Anemic  [] Hepatitis Gastrointestinal:  [] Blood in stool   [] Vomiting blood  [] Gastroesophageal reflux/heartburn   [] Difficulty swallowing. Genitourinary:  [] Chronic kidney disease   [] Difficult urination   [] Frequent urination  [] Burning with urination   [] Blood in urine Skin:  [] Rashes   [] Ulcers   [] Wounds Psychological:  [] History of anxiety   []  History of major depression.  Physical Examination  Vitals:   03/11/18 1208 03/11/18 1223 03/11/18 1305 03/11/18 1422  BP: 110/77  118/82 123/78  Pulse: 87  67 72  Resp: (!) 23   18  Temp:  (!) 97.2 F (36.2 C) (!) 97.3 F (36.3 C) 97.7 F (36.5 C)  TempSrc:   Oral Oral  SpO2: 100%  96% 92%  Weight:      Height:       Body mass index is 20.11 kg/m. Gen:  WD/WN, NAD Head: [place]/AT, No temporalis wasting.  Facial and neck swelling is mild to moderate.  Prominent superficial varicosities are seen in the shoulder and head and neck area Ear/Nose/Throat: Hearing grossly intact, nares w/o erythema or drainage, oropharynx w/o Erythema/Exudate Eyes: Sclera non-icteric, conjunctiva clear Neck: Trachea midline.  No JVD.  Pulmonary:  Good air movement, respirations not labored, equal bilaterally.  Cardiac: RRR, normal S1, S2. Vascular:  Vessel Right Left  Radial Palpable Palpable                                   Gastrointestinal: soft, non-tender/non-distended. No guarding/reflex.  Musculoskeletal: M/S 5/5 throughout.  Extremities without ischemic changes.  No deformity or atrophy. No edema. Neurologic: Sensation grossly intact in extremities.  Symmetrical.  Speech is fluent. Motor exam as listed above. Psychiatric: Judgment intact, Mood & affect appropriate for pt's clinical situation. Dermatologic: No rashes or ulcers noted.  No cellulitis or open wounds.       CBC Lab Results  Component Value Date   WBC 7.8 03/11/2018   HGB 11.9 (L) 03/11/2018   HCT 36.6 (L) 03/11/2018   MCV 89.5 03/11/2018   PLT 226 03/11/2018    BMET    Component Value Date/Time   NA 139 03/10/2018 0708   K 3.9 03/10/2018 0708   CL 102 03/10/2018 0708   CO2 28 03/10/2018 0708   GLUCOSE 95 03/10/2018 0708   BUN 10 03/10/2018 0708   CREATININE  0.95 03/10/2018 0708   CALCIUM 9.0 03/10/2018 0708   GFRNONAA >60 03/10/2018 0708   GFRAA >60 03/10/2018 0708   Estimated Creatinine Clearance: 78.6 mL/min (by C-G formula based on SCr of 0.95 mg/dL).  COAG Lab Results  Component Value Date   INR 1.01 03/09/2018    Radiology Ct Chest W Contrast  Result Date: 03/09/2018 CLINICAL DATA:  Facial and neck swelling for several  months. Abdominal pain. EXAM: CT CHEST, ABDOMEN, AND PELVIS WITH CONTRAST TECHNIQUE: Multidetector CT imaging of the chest, abdomen and pelvis was performed following the standard protocol during bolus administration of intravenous contrast. CONTRAST:  162mL ISOVUE-300 IOPAMIDOL (ISOVUE-300) INJECTION 61% COMPARISON:  None. FINDINGS: CT CHEST FINDINGS Cardiovascular: There is no evidence of thoracic aortic dissection or aneurysm. Normal cardiac size. No pericardial effusion is noted. 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 Small amount of contrast is seen flowing into the right atrium. This results in multiple collateral veins. Probable thrombotic occlusion of left brachiocephalic vein is noted as well. Mediastinum/Nodes: Previously described SVC thrombosis and severe narrowing appears to be due to ill-defined soft tissue mass in right paratracheal and precarinal area measuring 3.2 x 2.3 cm, as well as right hilar mass or node measuring 2.2 x 1.7 cm. Thyroid gland is unremarkable. Esophagus is unremarkable. Lungs/Pleura: No pneumothorax or pleural effusion is noted. Emphysematous disease is noted in both upper lobes. 2.1 x 2.0 cm irregular mass is noted medially in the right upper lobe concerning for malignancy. Also noted is spiculated density measuring 12 x 9 mm with pleural tail in right upper lobe concerning for possible malignancy. Musculoskeletal: No chest wall mass or suspicious bone lesions identified. CT ABDOMEN PELVIS FINDINGS Hepatobiliary: No focal liver abnormality is seen. No  gallstones, gallbladder wall thickening, or biliary dilatation. Pancreas: Unremarkable. No pancreatic ductal dilatation or surrounding inflammatory changes. Spleen: Normal in size without focal abnormality. Adrenals/Urinary Tract: Adrenal glands are unremarkable. Kidneys are normal, without renal calculi, focal lesion, or hydronephrosis. Bladder is unremarkable. Stomach/Bowel: The stomach appears normal. The appendix is not visualized. There is no evidence of bowel obstruction or inflammation. Vascular/Lymphatic: Atherosclerosis of abdominal aorta is noted without aneurysm or dissection. IVC and iliac veins appear to be patent. However, collateral veins are noted in the anterior subcutaneous tissues of the abdomen. No significant adenopathy is noted in the abdomen or pelvis. Reproductive: Prostate is unremarkable. Other: No abdominal wall hernia or abnormality. No abdominopelvic ascites. Musculoskeletal: No acute or significant osseous findings. IMPRESSION: 2.1 x 2.0 cm irregular mass is noted medially in right upper lobe concerning for malignancy. Also noted is probable right paratracheal, right hilar and precarinal adenopathy or malignancy, which results in severe narrowing and thrombosis of the superior vena cava. This results in multiple collateral veins in the chest and abdomen, as well as probable thrombosis of the left brachycephalic vein. This is consistent with SVC syndrome. PET scan is recommended for further evaluation. Aortic Atherosclerosis (ICD10-I70.0) and Emphysema (ICD10-J43.9). Electronically Signed   By: Marijo Conception, M.D.   On: 03/09/2018 15:19   Mr Jeri Cos QV Contrast  Result Date: 03/10/2018 CLINICAL DATA:  52 y/o  M; lung cancer for staging. EXAM: MRI HEAD WITHOUT AND WITH CONTRAST TECHNIQUE: Multiplanar, multiecho pulse sequences of the brain and surrounding structures were obtained without and with intravenous contrast. CONTRAST:  6 cc Gadavist COMPARISON:  03/09/2018 CT of chest,  abdomen, and pelvis. FINDINGS: Brain: No acute infarction, hemorrhage, hydrocephalus, extra-axial collection or mass lesion. Few nonspecific T2 FLAIR hyperintensities in subcortical and periventricular white matter are compatible with mild chronic microvascular ischemic changes for age. No abnormal enhancement. Vascular: Normal flow voids. Skull and upper cervical spine: Normal marrow signal. Sinuses/Orbits: Moderate mucosal thickening of paranasal sinuses. No abnormal signal of mastoid air cells. Orbits are unremarkable. Other: None. IMPRESSION: 1. No intracranial metastatic disease identified. 2. Mild chronic microvascular ischemic changes of the brain. 3.  Moderate paranasal sinus mucosal thickening. Electronically Signed   By: Kristine Garbe M.D.   On: 03/10/2018 03:58   Ct Abdomen Pelvis W Contrast  Result Date: 03/09/2018 CLINICAL DATA:  Facial and neck swelling for several months. Abdominal pain. EXAM: CT CHEST, ABDOMEN, AND PELVIS WITH CONTRAST TECHNIQUE: Multidetector CT imaging of the chest, abdomen and pelvis was performed following the standard protocol during bolus administration of intravenous contrast. CONTRAST:  170mL ISOVUE-300 IOPAMIDOL (ISOVUE-300) INJECTION 61% COMPARISON:  None. FINDINGS: CT CHEST FINDINGS Cardiovascular: There is no evidence of thoracic aortic dissection or aneurysm. Normal cardiac size. No pericardial effusion is noted. 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 Small amount of contrast is seen flowing into the right atrium. This results in multiple collateral veins. Probable thrombotic occlusion of left brachiocephalic vein is noted as well. Mediastinum/Nodes: Previously described SVC thrombosis and severe narrowing appears to be due to ill-defined soft tissue mass in right paratracheal and precarinal area measuring 3.2 x 2.3 cm, as well as right hilar mass or node measuring 2.2 x 1.7 cm. Thyroid gland is unremarkable. Esophagus is  unremarkable. Lungs/Pleura: No pneumothorax or pleural effusion is noted. Emphysematous disease is noted in both upper lobes. 2.1 x 2.0 cm irregular mass is noted medially in the right upper lobe concerning for malignancy. Also noted is spiculated density measuring 12 x 9 mm with pleural tail in right upper lobe concerning for possible malignancy. Musculoskeletal: No chest wall mass or suspicious bone lesions identified. CT ABDOMEN PELVIS FINDINGS Hepatobiliary: No focal liver abnormality is seen. No gallstones, gallbladder wall thickening, or biliary dilatation. Pancreas: Unremarkable. No pancreatic ductal dilatation or surrounding inflammatory changes. Spleen: Normal in size without focal abnormality. Adrenals/Urinary Tract: Adrenal glands are unremarkable. Kidneys are normal, without renal calculi, focal lesion, or hydronephrosis. Bladder is unremarkable. Stomach/Bowel: The stomach appears normal. The appendix is not visualized. There is no evidence of bowel obstruction or inflammation. Vascular/Lymphatic: Atherosclerosis of abdominal aorta is noted without aneurysm or dissection. IVC and iliac veins appear to be patent. However, collateral veins are noted in the anterior subcutaneous tissues of the abdomen. No significant adenopathy is noted in the abdomen or pelvis. Reproductive: Prostate is unremarkable. Other: No abdominal wall hernia or abnormality. No abdominopelvic ascites. Musculoskeletal: No acute or significant osseous findings. IMPRESSION: 2.1 x 2.0 cm irregular mass is noted medially in right upper lobe concerning for malignancy. Also noted is probable right paratracheal, right hilar and precarinal adenopathy or malignancy, which results in severe narrowing and thrombosis of the superior vena cava. This results in multiple collateral veins in the chest and abdomen, as well as probable thrombosis of the left brachycephalic vein. This is consistent with SVC syndrome. PET scan is recommended for further  evaluation. Aortic Atherosclerosis (ICD10-I70.0) and Emphysema (ICD10-J43.9). Electronically Signed   By: Marijo Conception, M.D.   On: 03/09/2018 15:19      Assessment/Plan 1. patient with SVC syndrome with newly diagnosed lung cancer with severe adenopathy No role for intervention at this point. Would recommend treatment with anticoagulation for a minimum of 3 months. If there is tumor shrinkage, consideration for intervention at a later date could be done. 2.  Lung cancer.  Significant adenopathy.  We will begin treatment for lung cancer in the near future.  No role for port placement at this time with his superior vena cava syndrome which would preclude jugular or subclavian access from either side 3.  Tobacco use.  This is an underlying cause  of his lung cancer more than likely and also increases his thrombotic risk.   Leotis Pain, MD  03/11/2018 3:01 PM    This note was created with Dragon medical transcription system.  Any error is purely unintentional

## 2018-03-11 ENCOUNTER — Inpatient Hospital Stay: Payer: Self-pay | Admitting: Anesthesiology

## 2018-03-11 ENCOUNTER — Encounter
Admission: EM | Disposition: A | Discharge status: Home / Self Care | Payer: Self-pay | Source: Home / Self Care | Attending: Internal Medicine

## 2018-03-11 DIAGNOSIS — C3411 Malignant neoplasm of upper lobe, right bronchus or lung: Secondary | ICD-10-CM

## 2018-03-11 DIAGNOSIS — Z9889 Other specified postprocedural states: Secondary | ICD-10-CM

## 2018-03-11 DIAGNOSIS — I82290 Acute embolism and thrombosis of other thoracic veins: Secondary | ICD-10-CM

## 2018-03-11 DIAGNOSIS — C3491 Malignant neoplasm of unspecified part of right bronchus or lung: Principal | ICD-10-CM

## 2018-03-11 DIAGNOSIS — I82210 Acute embolism and thrombosis of superior vena cava: Secondary | ICD-10-CM

## 2018-03-11 DIAGNOSIS — F17210 Nicotine dependence, cigarettes, uncomplicated: Secondary | ICD-10-CM

## 2018-03-11 DIAGNOSIS — R0602 Shortness of breath: Secondary | ICD-10-CM

## 2018-03-11 HISTORY — PX: ENDOBRONCHIAL ULTRASOUND: SHX5096

## 2018-03-11 LAB — CBC
HEMATOCRIT: 36.6 % — AB (ref 39.0–52.0)
Hemoglobin: 11.9 g/dL — ABNORMAL LOW (ref 13.0–17.0)
MCH: 29.1 pg (ref 26.0–34.0)
MCHC: 32.5 g/dL (ref 30.0–36.0)
MCV: 89.5 fL (ref 80.0–100.0)
PLATELETS: 226 10*3/uL (ref 150–400)
RBC: 4.09 MIL/uL — ABNORMAL LOW (ref 4.22–5.81)
RDW: 13.2 % (ref 11.5–15.5)
WBC: 7.8 10*3/uL (ref 4.0–10.5)
nRBC: 0 % (ref 0.0–0.2)

## 2018-03-11 LAB — HEPARIN LEVEL (UNFRACTIONATED)
HEPARIN UNFRACTIONATED: 0.53 [IU]/mL (ref 0.30–0.70)
Heparin Unfractionated: 0.23 IU/mL — ABNORMAL LOW (ref 0.30–0.70)

## 2018-03-11 LAB — HIV ANTIBODY (ROUTINE TESTING W REFLEX): HIV Screen 4th Generation wRfx: NONREACTIVE

## 2018-03-11 SURGERY — ENDOBRONCHIAL ULTRASOUND (EBUS)
Anesthesia: General

## 2018-03-11 MED ORDER — IPRATROPIUM-ALBUTEROL 0.5-2.5 (3) MG/3ML IN SOLN
3.0000 mL | Freq: Four times a day (QID) | RESPIRATORY_TRACT | Status: DC
  Administered 2018-03-11 – 2018-03-12 (×3): 3 mL via RESPIRATORY_TRACT
  Filled 2018-03-11 (×2): qty 3
  Filled 2018-03-11: qty 42

## 2018-03-11 MED ORDER — DEXMEDETOMIDINE HCL 200 MCG/2ML IV SOLN
INTRAVENOUS | Status: DC | PRN
  Administered 2018-03-11 (×3): 8 ug via INTRAVENOUS

## 2018-03-11 MED ORDER — FENTANYL CITRATE (PF) 100 MCG/2ML IJ SOLN
INTRAMUSCULAR | Status: AC
  Filled 2018-03-11: qty 2

## 2018-03-11 MED ORDER — PROPOFOL 10 MG/ML IV BOLUS
INTRAVENOUS | Status: AC
  Filled 2018-03-11: qty 20

## 2018-03-11 MED ORDER — LIDOCAINE HCL (CARDIAC) PF 100 MG/5ML IV SOSY
PREFILLED_SYRINGE | INTRAVENOUS | Status: DC | PRN
  Administered 2018-03-11: 50 mg via INTRAVENOUS
  Administered 2018-03-11: 40 mg via INTRAVENOUS

## 2018-03-11 MED ORDER — HEPARIN (PORCINE) IN NACL 100-0.45 UNIT/ML-% IJ SOLN
1450.0000 [IU]/h | INTRAMUSCULAR | Status: DC
  Administered 2018-03-11: 1350 [IU]/h via INTRAVENOUS
  Filled 2018-03-11: qty 250

## 2018-03-11 MED ORDER — PROPOFOL 500 MG/50ML IV EMUL
INTRAVENOUS | Status: DC | PRN
  Administered 2018-03-11: 150 ug/kg/min via INTRAVENOUS

## 2018-03-11 MED ORDER — PROPOFOL 500 MG/50ML IV EMUL
INTRAVENOUS | Status: AC
  Filled 2018-03-11: qty 50

## 2018-03-11 MED ORDER — SODIUM CHLORIDE FLUSH 0.9 % IV SOLN
INTRAVENOUS | Status: AC
  Administered 2018-03-11: 22:00:00 3 mL via INTRAVENOUS
  Filled 2018-03-11: qty 10

## 2018-03-11 MED ORDER — FENTANYL CITRATE (PF) 100 MCG/2ML IJ SOLN
INTRAMUSCULAR | Status: DC | PRN
  Administered 2018-03-11: 50 ug via INTRAVENOUS
  Administered 2018-03-11 (×2): 25 ug via INTRAVENOUS

## 2018-03-11 MED ORDER — MIDAZOLAM HCL 2 MG/2ML IJ SOLN
INTRAMUSCULAR | Status: DC | PRN
  Administered 2018-03-11: 2 mg via INTRAVENOUS

## 2018-03-11 MED ORDER — ONDANSETRON HCL 4 MG/2ML IJ SOLN: 4.0000 mg | Freq: Once | INTRAMUSCULAR | Status: DC | PRN

## 2018-03-11 MED ORDER — FENTANYL CITRATE (PF) 100 MCG/2ML IJ SOLN: 25.0000 ug | INTRAMUSCULAR | Status: DC | PRN

## 2018-03-11 MED ORDER — DEXAMETHASONE SODIUM PHOSPHATE 10 MG/ML IJ SOLN
INTRAMUSCULAR | Status: DC | PRN
  Administered 2018-03-11: 5 mg via INTRAVENOUS

## 2018-03-11 MED ORDER — ROCURONIUM BROMIDE 100 MG/10ML IV SOLN
INTRAVENOUS | Status: DC | PRN
  Administered 2018-03-11: 5 mg via INTRAVENOUS

## 2018-03-11 MED ORDER — LACTATED RINGERS IV SOLN
INTRAVENOUS | Status: DC
  Administered 2018-03-11: 10:00:00 via INTRAVENOUS

## 2018-03-11 MED ORDER — ONDANSETRON HCL 4 MG/2ML IJ SOLN
INTRAMUSCULAR | Status: DC | PRN
  Administered 2018-03-11: 4 mg via INTRAVENOUS

## 2018-03-11 MED ORDER — SUCCINYLCHOLINE CHLORIDE 20 MG/ML IJ SOLN
INTRAMUSCULAR | Status: DC | PRN
  Administered 2018-03-11: 100 mg via INTRAVENOUS

## 2018-03-11 MED ORDER — PROPOFOL 10 MG/ML IV BOLUS
INTRAVENOUS | Status: DC | PRN
  Administered 2018-03-11: 10 mg via INTRAVENOUS
  Administered 2018-03-11: 150 mg via INTRAVENOUS
  Administered 2018-03-11 (×2): 20 mg via INTRAVENOUS

## 2018-03-11 MED ORDER — HEPARIN BOLUS VIA INFUSION
900.0000 [IU] | Freq: Once | INTRAVENOUS | Status: AC
  Administered 2018-03-11: 22:00:00 900 [IU] via INTRAVENOUS
  Filled 2018-03-11: qty 900

## 2018-03-11 MED ORDER — MIDAZOLAM HCL 2 MG/2ML IJ SOLN
INTRAMUSCULAR | Status: AC
  Filled 2018-03-11: qty 2

## 2018-03-11 NOTE — Anesthesia Postprocedure Evaluation (Signed)
Anesthesia Post Note  Patient: Alexander [name]  Procedure(s) Performed: ENDOBRONCHIAL ULTRASOUND (N/A )  Patient location during evaluation: PACU Anesthesia Type: General Level of consciousness: awake and alert Pain management: pain level controlled Vital Signs Assessment: post-procedure vital signs reviewed and stable Respiratory status: spontaneous breathing and respiratory function stable Cardiovascular status: stable Anesthetic complications: no     Last Vitals:  Vitals:   03/11/18 1158 03/11/18 1208  BP: 97/65 110/77  Pulse: 93 87  Resp: 20 (!) 23  Temp:    SpO2: 98% 100%    Last Pain:  Vitals:   03/11/18 1208  TempSrc:   PainSc: 0-No pain                 Latima Hamza K

## 2018-03-11 NOTE — Transfer of Care (Signed)
Immediate Anesthesia Transfer of Care Note  Patient: Alexander [name]  Procedure(s) Performed: ENDOBRONCHIAL ULTRASOUND (N/A )  Patient Location: PACU  Anesthesia Type:General  Level of Consciousness: awake  Airway & Oxygen Therapy: Patient Spontanous Breathing and Patient connected to face mask oxygen  Post-op Assessment: Report given to RN and Post -op Vital signs reviewed and stable  Post vital signs: Reviewed  Last Vitals:  Vitals Value Taken Time  BP 92/62 03/11/2018 11:38 AM  Temp    Pulse 98 03/11/2018 11:38 AM  Resp 20 03/11/2018 11:38 AM  SpO2 99 % 03/11/2018 11:38 AM  Vitals shown include unvalidated device data.  Last Pain:  Vitals:   03/11/18 1017  TempSrc: Tympanic  PainSc: 0-No pain         Complications: No apparent anesthesia complications

## 2018-03-11 NOTE — Anesthesia Procedure Notes (Signed)
Procedure Name: Intubation Performed by: Rolla Plate, CRNA Pre-anesthesia Checklist: Patient identified, Patient being monitored, Timeout performed, Emergency Drugs available and Suction available Patient Re-evaluated:Patient Re-evaluated prior to induction Oxygen Delivery Method: Circle system utilized Preoxygenation: Pre-oxygenation with 100% oxygen Induction Type: IV induction Ventilation: Mask ventilation without difficulty Laryngoscope Size: Mac and 4 Grade View: Grade II Tube type: Oral Tube size: 8.5 mm Number of attempts: 1 Airway Equipment and Method: Stylet Placement Confirmation: ETT inserted through vocal cords under direct vision,  positive ETCO2 and breath sounds checked- equal and bilateral Secured at: 23 cm Tube secured with: Tape Dental Injury: Teeth and Oropharynx as per pre-operative assessment

## 2018-03-11 NOTE — Progress Notes (Signed)
Patient ID: Thayne Cindric, male   DOB: 24-Aug-1965, 52 y.o.   MRN: [id number]  [facility] PROGRESS NOTE  Cashton Hosley DXI:[id number] DOB: 1966-04-22 DOA: 03/09/2018 PCP: Patient, No Pcp Per  HPI/Subjective: Patient will sleepy after procedure.  No complaints of shortness of breath.  Does have a little chest discomfort.  Objective: Vitals:   03/11/18 1305 03/11/18 1422  BP: 118/82 123/78  Pulse: 67 72  Resp:  18  Temp: (!) 97.3 F (36.3 C) 97.7 F (36.5 C)  SpO2: 96% 92%    Filed Weights   03/09/18 1044 03/09/18 1818 03/11/18 1022  Weight: 62.6 kg 61.8 kg 61.8 kg    ROS: Review of Systems  Constitutional: Negative for chills and fever.  Eyes: Negative for blurred vision.  Respiratory: Negative for cough and shortness of breath.   Cardiovascular: Positive for chest pain.  Gastrointestinal: Negative for abdominal pain, constipation, diarrhea, nausea and vomiting.  Genitourinary: Negative for dysuria.  Musculoskeletal: Negative for joint pain.  Neurological: Negative for dizziness and headaches.   Exam: Physical Exam  Constitutional: He is oriented to person, place, and time.  HENT:  Nose: No mucosal edema.  Mouth/Throat: No oropharyngeal exudate or posterior oropharyngeal edema.  Eyes: Pupils are equal, round, and reactive to light. Conjunctivae, EOM and lids are normal.  Neck: No JVD present. Carotid bruit is not present. No edema present. No thyroid mass and no thyromegaly present.  Cardiovascular: S1 normal and S2 normal. Exam reveals no gallop.  No murmur heard. Pulses:      Dorsalis pedis pulses are 2+ on the right side, and 2+ on the left side.  Respiratory: No respiratory distress. He has wheezes in the right upper field and the left upper field. He has no rhonchi. He has no rales.  GI: Soft. Bowel sounds are normal. There is no tenderness.  Musculoskeletal:       Right ankle: He exhibits no swelling.       Left ankle: He exhibits no swelling.   Lymphadenopathy:    He has no cervical adenopathy.  Neurological: He is alert and oriented to person, place, and time. No cranial nerve deficit.  Skin: Skin is warm. No rash noted. Nails show no clubbing.  Psychiatric: He has a normal mood and affect.      Data Reviewed: Basic Metabolic Panel: Recent Labs  Lab 03/09/18 1051 03/10/18 0708  NA 140 139  K 4.2 3.9  CL 101 102  CO2 28 28  GLUCOSE 103* 95  BUN 10 10  CREATININE 0.88 0.95  CALCIUM 9.1 9.0   Liver Function Tests: Recent Labs  Lab 03/09/18 1051  AST 14*  ALT 9  ALKPHOS 103  BILITOT 0.5  PROT 7.6  ALBUMIN 4.0   CBC: Recent Labs  Lab 03/09/18 1051 03/10/18 0708 03/11/18 0342  WBC 10.0 7.6 7.8  HGB 12.7* 11.6* 11.9*  HCT 40.2 36.5* 36.6*  MCV 90.1 89.9 89.5  PLT 258 233 226   Cardiac Enzymes: Recent Labs  Lab 03/09/18 1426  TROPONINI <0.03     Studies: Mr Jeri Cos JQ Contrast  Result Date: 03/10/2018 CLINICAL DATA:  52 y/o  M; lung cancer for staging. EXAM: MRI HEAD WITHOUT AND WITH CONTRAST TECHNIQUE: Multiplanar, multiecho pulse sequences of the brain and surrounding structures were obtained without and with intravenous contrast. CONTRAST:  6 cc Gadavist COMPARISON:  03/09/2018 CT of chest, abdomen, and pelvis. FINDINGS: Brain: No acute infarction, hemorrhage, hydrocephalus, extra-axial collection or mass lesion. Few nonspecific T2 FLAIR  hyperintensities in subcortical and periventricular white matter are compatible with mild chronic microvascular ischemic changes for age. No abnormal enhancement. Vascular: Normal flow voids. Skull and upper cervical spine: Normal marrow signal. Sinuses/Orbits: Moderate mucosal thickening of paranasal sinuses. No abnormal signal of mastoid air cells. Orbits are unremarkable. Other: None. IMPRESSION: 1. No intracranial metastatic disease identified. 2. Mild chronic microvascular ischemic changes of the brain. 3. Moderate paranasal sinus mucosal thickening.  Electronically Signed   By: Kristine Garbe M.D.   On: 03/10/2018 03:58    Scheduled Meds: . sodium chloride flush  3 mL Intravenous Q12H  . sodium chloride flush       Continuous Infusions: . sodium chloride    . heparin    . lactated ringers 75 mL/hr at 03/11/18 1019    Assessment/Plan:  1. SVC syndrome.  Swelling of the upper extremity seems a little bit better in the upper arms.  Radiation oncology consultation appreciated. 2. 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  Heparin drip to be restarted this afternoon.  Oncology wanted me to hold off on starting Coumadin at this point until we know that the biopsy was conclusive.  Because the patient does not have insurance the newer agents likely will not be a good option. 3. Lung nodules concerning for malignancy.  Status post bronchoscopy. 4. Slight wheeze.  Start nebulizer treatments. 5. Tobacco abuse.  Patient refused nicotine patch.  Code Status:     Code Status Orders  (From admission, onward)         Start     Ordered   03/09/18 1812  Full code  Continuous     03/09/18 1811        Code Status History    This patient has a current code status but no historical code status.     Family Communication: Girlfriend at bedside Disposition Plan: To be determined based on anticoagulation.  Consultants:  Oncology  Radiation oncology  Vascular surgery  Procedures:  Bronchoscopy  Time spent: 28 minutes  [place]

## 2018-03-11 NOTE — Progress Notes (Signed)
[place] for heparin Indication: SVC thrombosis  Allergies  Allergen Reactions  . Penicillins     Patient Measurements: Height: 5\' 9"  (175.3 cm) Weight: 136 lb 3.2 oz (61.8 kg) IBW/kg (Calculated) : 70.7 Heparin Dosing Weight: 62.6 kg  Vital Signs: Temp: 97.3 F (36.3 C) (10/17 1305) Temp Source: Oral (10/17 1305) BP: 118/82 (10/17 1305) Pulse Rate: 67 (10/17 1305)  Labs: Recent Labs    03/09/18 1051 03/09/18 1426 03/09/18 1608  03/10/18 0708 03/10/18 1440 03/10/18 2020 03/11/18 0342  HGB 12.7*  --   --   --  11.6*  --   --  11.9*  HCT 40.2  --   --   --  36.5*  --   --  36.6*  PLT 258  --   --   --  233  --   --  226  APTT  --   --  34  --   --   --   --   --   LABPROT  --   --  13.2  --   --   --   --   --   INR  --   --  1.01  --   --   --   --   --   HEPARINUNFRC  --   --   --    < > 0.13* 0.38 0.52 0.53  CREATININE 0.88  --   --   --  0.95  --   --   --   TROPONINI  --  <0.03  --   --   --   --   --   --    < > = values in this interval not displayed.    Estimated Creatinine Clearance: 78.6 mL/min (by C-G formula based on SCr of 0.95 mg/dL).   Medical History: Past Medical History:  Diagnosis Date  . COPD (chronic obstructive pulmonary disease) ([place])   . Lung mass     Assessment: 52 yo male to start on heparin drip for SVC thrombosis. No PTA meds on med rec  Goal of Therapy:  Heparin level 0.3-0.7 units/ml Monitor platelets by anticoagulation protocol: Yes   Plan:  10/17 Heparin drip was stopped at 08:00 for scheduled bronch. Patient is now s/p bronch and Heparin drip will resume at previous rate of 1350 units/hr at 15:00, no bolus per MD request. Will check Heparin level 6 hours after restart. Daily CBC while on Heparin drip.  Pharmacy will continue to follow.    Paulina Fusi, PharmD, BCPS 03/11/2018 1:23 PM

## 2018-03-11 NOTE — Anesthesia Preprocedure Evaluation (Signed)
Anesthesia Evaluation  Patient identified by MRN, date of birth, ID band Patient awake    Reviewed: Allergy & Precautions, NPO status , Patient's Chart, lab work & pertinent test results  History of Anesthesia Complications Negative for: history of anesthetic complications  Airway Mallampati: II       Dental   Pulmonary neg sleep apnea, COPD,  COPD inhaler, Current Smoker,           Cardiovascular (-) hypertension(-) Past MI and (-) CHF (-) dysrhythmias (-) Valvular Problems/Murmurs  SVC syndrome with SVC clot   Neuro/Psych neg Seizures    GI/Hepatic Neg liver ROS, neg GERD  ,  Endo/Other  neg diabetes  Renal/GU negative Renal ROS     Musculoskeletal   Abdominal   Peds  Hematology   Anesthesia Other Findings   Reproductive/Obstetrics                             Anesthesia Physical Anesthesia Plan  ASA: II  Anesthesia Plan: General   Post-op Pain Management:    Induction: Intravenous  PONV Risk Score and Plan: 1 and Ondansetron  Airway Management Planned: Oral ETT  Additional Equipment:   Intra-op Plan:   Post-operative Plan:   Informed Consent: I have reviewed the patients History and Physical, chart, labs and discussed the procedure including the risks, benefits and alternatives for the proposed anesthesia with the patient or authorized representative who has indicated his/her understanding and acceptance.     Plan Discussed with:   Anesthesia Plan Comments:         Anesthesia Quick Evaluation

## 2018-03-11 NOTE — Progress Notes (Signed)
[place] for heparin Indication: SVC thrombosis  Allergies  Allergen Reactions  . Penicillins     Patient Measurements: Height: 5\' 9"  (175.3 cm) Weight: 136 lb 3.2 oz (61.8 kg) IBW/kg (Calculated) : 70.7 Heparin Dosing Weight: 62.6 kg  Vital Signs: Temp: 97.8 F (36.6 C) (10/17 0529) Temp Source: Oral (10/17 0529) BP: 116/87 (10/17 0529) Pulse Rate: 80 (10/17 0529)  Labs: Recent Labs    03/09/18 1051 03/09/18 1426 03/09/18 1608  03/10/18 0708 03/10/18 1440 03/10/18 2020 03/11/18 0342  HGB 12.7*  --   --   --  11.6*  --   --  11.9*  HCT 40.2  --   --   --  36.5*  --   --  36.6*  PLT 258  --   --   --  233  --   --  226  APTT  --   --  34  --   --   --   --   --   LABPROT  --   --  13.2  --   --   --   --   --   INR  --   --  1.01  --   --   --   --   --   HEPARINUNFRC  --   --   --    < > 0.13* 0.38 0.52 0.53  CREATININE 0.88  --   --   --  0.95  --   --   --   TROPONINI  --  <0.03  --   --   --   --   --   --    < > = values in this interval not displayed.    Estimated Creatinine Clearance: 78.6 mL/min (by C-G formula based on SCr of 0.95 mg/dL).   Medical History: Past Medical History:  Diagnosis Date  . COPD (chronic obstructive pulmonary disease) ([place])   . Lung mass     Assessment: 52 yo male to start on heparin drip for SVC thrombosis. No PTA meds on med rec  Goal of Therapy:  Heparin level 0.3-0.7 units/ml Monitor platelets by anticoagulation protocol: Yes   Plan:  10/16 14:40 HL 0.38 Level is therapeutic. Will continue infusion at 1350 units/hr.  Recheck HL in 6 hours for confirmation. CBC with AM labs per protocol.   10/16: HL @ 2020 = 0.52 Will continue this pt on current rate and recheck HL on 10/17 with AM labs.   10/17 AM heparin level 0.53. Continue current regimen. Recheck heparin level and CBC with tomorrow AM labs  Pharmacy will continue to follow.    03/11/2018 5:41 AM

## 2018-03-11 NOTE — Care Management (Signed)
Spoke with patient's fiancee.  Patient just returned from bronchoscopy and sleeping soundly.  He has been diagnosed with mass right lung.  Anticipate radiation.  He has no insurance. Is self employed and files taxes.  Prior to this episode of illness was on no medications.  There is concern patient will need anticoagulation. Informed attending could receive coupon for Eliquis ? Xarelto but then patient would have to complete patient assistance forms with drug companies. Other alternative could be coumadin with protimes at the cancer center.  Provided application to [facility].

## 2018-03-11 NOTE — Progress Notes (Signed)
[facility] for heparin Indication: SVC thrombosis  Allergies  Allergen Reactions  . Penicillins     Patient Measurements: Height: 5\' 9"  (175.3 cm) Weight: 136 lb 3.2 oz (61.8 kg) IBW/kg (Calculated) : 70.7 Heparin Dosing Weight: 62.6 kg  Vital Signs: Temp: 97.9 F (36.6 C) (10/17 2001) Temp Source: Oral (10/17 2001) BP: 114/73 (10/17 2001) Pulse Rate: 112 (10/17 2001)  Labs: Recent Labs    03/09/18 1051 03/09/18 1426 03/09/18 1608  03/10/18 0708  03/10/18 2020 03/11/18 0342 03/11/18 2102  HGB 12.7*  --   --   --  11.6*  --   --  11.9*  --   HCT 40.2  --   --   --  36.5*  --   --  36.6*  --   PLT 258  --   --   --  233  --   --  226  --   APTT  --   --  34  --   --   --   --   --   --   LABPROT  --   --  13.2  --   --   --   --   --   --   INR  --   --  1.01  --   --   --   --   --   --   HEPARINUNFRC  --   --   --    < > 0.13*   < > 0.52 0.53 0.23*  CREATININE 0.88  --   --   --  0.95  --   --   --   --   TROPONINI  --  <0.03  --   --   --   --   --   --   --    < > = values in this interval not displayed.    Estimated Creatinine Clearance: 78.6 mL/min (by C-G formula based on SCr of 0.95 mg/dL).   Medical History: Past Medical History:  Diagnosis Date  . COPD (chronic obstructive pulmonary disease) ([place])   . Lung mass     Assessment: 52 yo male to start on heparin drip for SVC thrombosis. No PTA meds on med rec  Goal of Therapy:  Heparin level 0.3-0.7 units/ml Monitor platelets by anticoagulation protocol: Yes   Plan:  10/17 Heparin drip was stopped at 08:00 for scheduled bronch. Patient is now s/p bronch and Heparin drip will resume at previous rate of 1350 units/hr at 15:00, no bolus per MD request. Will check Heparin level 6 hours after restart. Daily CBC while on Heparin drip.  10/17:  HL @ 21:00 = 0.23 Will order Heparin 900 units IV X 1 bolus and increase drip rate to 1450 units/hr. Will recheck HL 6 hrs  after rate change.   Pharmacy will continue to follow.   Milford Cilento D 03/11/2018 9:51 PM

## 2018-03-11 NOTE — Progress Notes (Signed)
Agree with assessment charted by Bary Castilla, [facility]. Madlyn Frankel, RN

## 2018-03-11 NOTE — Anesthesia Post-op Follow-up Note (Signed)
Anesthesia QCDR form completed.        

## 2018-03-11 NOTE — Procedures (Signed)
PROCEDURE: BRONCHOSCOPY WITH ENDOBRONCHIAL ULTRASOUND   PROCEDURE DATE: 11 March 2018  TIME: 1030 hrs. NAME: Alexander [name], Alexander [name] DOB: 09-22-1965 MRN: [id number] LOC:  81 Oncology   HOSP DAY:2 CODE STATUS:  Full       Indications/Preliminary Diagnosis: Right hilar mass, SVC syndrome rule out malignancy  Consent: (Place X beside choice/s below)  The benefits, risks and possible complications of the procedure were        explained to:  _X  _ patient  ___ patient's family  _X__ other:patient's S/O___________  who verbalized understanding and gave:  ___ verbal  ___ written  _X__ verbal and written  ___ telephone  ___ other:________ consent.      Unable to obtain consent; procedure performed on emergent basis.     Other:       PRESEDATION ASSESSMENT: History and Physical has been performed. Patient meds and allergies have been reviewed. Presedation airway examination has been performed and documented. Baseline vital signs, sedation score, oxygenation status, and cardiac rhythm were reviewed. Patient was deemed to be in satisfactory condition to undergo the procedure.    PREMEDICATIONS: SEE ANESTHESIOLOGY RECORDS   Insertion Route (Place X beside choice below)   Nasal   Oral  X Endotracheal Tube   Tracheostomy   INTRAPROCEDURE MEDICATIONS: SEE ANESTHESIOLOGY RECORDS   PROCEDURE DETAILS: Timeout performed and correct patient, name, & ID confirmed. Following prep per Pulmonary policy, the patient was inducted under general anesthesia by CRNA with anesthesiologist present.  I proceeded with introducing the Olympus video bronco scope and the airway was thoroughly examined. The visible trachea was normal. Carina sharp. The secondary Carina to the right upper lobe was thickened. There were no end bronchial lesions. Right middle lobe and right lower lobes were without in the bronchial lesions. In similar fashion the left tracheobronchial tree was examined. There were no in the bronchial  lesions noted all subsegments appeared normal. Patient did have some retained and specific secretions that were lavaged until clear. At this point the broncho scope was swapped for an end of bronchial ultrasound scope. The mediastinum was examined. Because of the patient's SVC syndrome all vessels appeared engorged. A mass was evident in the right hilar area corresponding to station 11 R mass appear to have some areas of necrosis. Findings, technical procedures, and specimen collection as noted below. The test was sampled initially with a 21 gauge Olympus EBUS and because of issues with bloodied specimens the needle was switched to a 22 gauge EBUS needle. There were needle cores placed on cytolyt. Initial slides showed only necrotic debris, inflammation and blood. At the end of exam the scope was withdrawn without incident. The patient received 9 ml's of lidocaine 1% via bronchial lavage prior to withdrawing the scope.   SPECIMENS (Sites): (Place X beside choice below)  Specimens Description   No Specimens Obtained     Washings    Lavage    Biopsies   X Fine Needle Aspirates TBNA    Brushings    Sputum    FINDINGS: as described above.  ESTIMATED BLOOD LOSS: less than 5 ml.   COMPLICATIONS/RESOLUTION: no immediate complications noted. Patient tolerated the procedure well.     IMPRESSION:POST-PROCEDURE DX: R hilar mass station 11R, suspicious for carcinoma. No endobronchial lesions.    RECOMMENDATION/PLAN:  Follow up Pathology Reports Discussed with Dr. Janese Banks of Oncology.

## 2018-03-11 NOTE — Progress Notes (Signed)
Hematology/Oncology Consult note [facility]  [phone] [phone]  Patient Care Team: Patient, No Pcp Per as PCP - General ([employer]) Telford Nab, RN as Registered Nurse   Name of the patient: Alexander [name]  [id number]  03-04-1966   Date of visit: 03/11/2018  Interval history-feels a little better after heparin drip was started.  Reports that his face feels less swollen.  Neck veins remain distended.  He is not short of breath and denies any pain.  He is eager to go home  ECOG PS- 0 Pain scale- 0   Review of systems- Review of Systems  Constitutional: Negative for chills, fever, malaise/fatigue and weight loss.  HENT: Negative for congestion, ear discharge and nosebleeds.        Neck swelling  Eyes: Negative for blurred vision.  Respiratory: Negative for cough, hemoptysis, sputum production, shortness of breath and wheezing.   Cardiovascular: Negative for chest pain, palpitations, orthopnea and claudication.  Gastrointestinal: Negative for abdominal pain, blood in stool, constipation, diarrhea, heartburn, melena, nausea and vomiting.  Genitourinary: Negative for dysuria, flank pain, frequency, hematuria and urgency.  Musculoskeletal: Negative for back pain, joint pain and myalgias.  Skin: Negative for rash.  Neurological: Negative for dizziness, tingling, focal weakness, seizures, weakness and headaches.  Endo/Heme/Allergies: Does not bruise/bleed easily.  Psychiatric/Behavioral: Negative for depression and suicidal ideas. The patient does not have insomnia.       Allergies  Allergen Reactions  . Penicillins      Past Medical History:  Diagnosis Date  . COPD (chronic obstructive pulmonary disease) ([place])   . Lung mass      Past Surgical History:  Procedure Laterality Date  . none      Social History   Socioeconomic History  . Marital status: Divorced    Spouse name: Not on file  . Number of  children: Not on file  . Years of education: Not on file  . Highest education level: Not on file  Occupational History  . Occupation: Cabin crew  Social Needs  . Financial resource strain: Not on file  . Food insecurity:    Worry: Not on file    Inability: Not on file  . Transportation needs:    Medical: Not on file    Non-medical: Not on file  Tobacco Use  . Smoking status: Current Every Day Smoker  . Smokeless tobacco: Never Used  Substance and Sexual Activity  . Alcohol use: Not Currently  . Drug use: Not Currently  . Sexual activity: Yes  Lifestyle  . Physical activity:    Days per week: Not on file    Minutes per session: Not on file  . Stress: Not on file  Relationships  . Social connections:    Talks on phone: Not on file    Gets together: Not on file    Attends religious service: Not on file    Active member of club or organization: Not on file    Attends meetings of clubs or organizations: Not on file    Relationship status: Not on file  . Intimate partner violence:    Fear of current or ex partner: Not on file    Emotionally abused: Not on file    Physically abused: Not on file    Forced sexual activity: Not on file  Other Topics Concern  . Not on file  Social History Narrative  . Not on file    History reviewed. No pertinent family history.  Current Facility-Administered Medications:  .  0.9 %  sodium chloride infusion, 250 mL, Intravenous, PRN, Pyreddy, Pavan, MD .  acetaminophen (TYLENOL) tablet 650 mg, 650 mg, Oral, Q6H PRN **OR** acetaminophen (TYLENOL) suppository 650 mg, 650 mg, Rectal, Q6H PRN, Pyreddy, Pavan, MD .  heparin ADULT infusion 100 units/mL (25000 units/233mL sodium chloride 0.45%), 1,350 Units/hr, Intravenous, Continuous, Vira Blanco, RPH, Last Rate: 13.5 mL/hr at 03/11/18 1841, 1,350 Units/hr at 03/11/18 1841 .  ipratropium-albuterol (DUONEB) 0.5-2.5 (3) MG/3ML nebulizer solution 3 mL, 3 mL, Nebulization, Q6H, Wieting, Richard,  MD, 3 mL at 03/11/18 1936 .  oxyCODONE-acetaminophen (PERCOCET/ROXICET) 5-325 MG per tablet 1-2 tablet, 1-2 tablet, Oral, Q4H PRN, Loletha Grayer, MD, 1 tablet at 03/11/18 1800 .  senna-docusate (Senokot-S) tablet 1 tablet, 1 tablet, Oral, QHS PRN, Pyreddy, Pavan, MD .  sodium chloride flush (NS) 0.9 % injection 3 mL, 3 mL, Intravenous, Q12H, Pyreddy, Pavan, MD, 3 mL at 03/10/18 2139 .  sodium chloride flush (NS) 0.9 % injection 3 mL, 3 mL, Intravenous, PRN, Pyreddy, Pavan, MD .  sodium chloride flush 0.9 % injection, , , ,   Physical exam:  Vitals:   03/11/18 1422 03/11/18 1600 03/11/18 1936 03/11/18 2001  BP: 123/78 133/88  114/73  Pulse: 72 (!) 102  (!) 112  Resp: 18 17  16   Temp: 97.7 F (36.5 C) 97.7 F (36.5 C)  97.9 F (36.6 C)  TempSrc: Oral Oral  Oral  SpO2: 92% 98% 98% 97%  Weight:      Height:       Physical Exam  Constitutional: He is oriented to person, place, and time. He appears well-developed and well-nourished.  HENT:  Head: Normocephalic and atraumatic.  Neck veins appear distended bilaterally  Eyes: Pupils are equal, round, and reactive to light. EOM are normal.  Neck: Normal range of motion.  Cardiovascular: Normal rate, regular rhythm and normal heart sounds.  Pulmonary/Chest: Effort normal and breath sounds normal.  Abdominal: Soft. Bowel sounds are normal.  Neurological: He is alert and oriented to person, place, and time.  Skin: Skin is warm and dry.     CMP Latest Ref Rng & Units 03/10/2018  Glucose 70 - 99 mg/dL 95  BUN 6 - 20 mg/dL 10  Creatinine 0.61 - 1.24 mg/dL 0.95  Sodium 135 - 145 mmol/L 139  Potassium 3.5 - 5.1 mmol/L 3.9  Chloride 98 - 111 mmol/L 102  CO2 22 - 32 mmol/L 28  Calcium 8.9 - 10.3 mg/dL 9.0  Total Protein 6.5 - 8.1 g/dL -  Total Bilirubin 0.3 - 1.2 mg/dL -  Alkaline Phos 38 - 126 U/L -  AST 15 - 41 U/L -  ALT 0 - 44 U/L -   CBC Latest Ref Rng & Units 03/11/2018  WBC 4.0 - 10.5 K/uL 7.8  Hemoglobin 13.0 - 17.0 g/dL  11.9(L)  Hematocrit 39.0 - 52.0 % 36.6(L)  Platelets 150 - 400 K/uL 226    @IMAGES @  Ct Chest W Contrast  Result Date: 03/09/2018 CLINICAL DATA:  Facial and neck swelling for several months. Abdominal pain. EXAM: CT CHEST, ABDOMEN, AND PELVIS WITH CONTRAST TECHNIQUE: Multidetector CT imaging of the chest, abdomen and pelvis was performed following the standard protocol during bolus administration of intravenous contrast. CONTRAST:  11mL ISOVUE-300 IOPAMIDOL (ISOVUE-300) INJECTION 61% COMPARISON:  None. FINDINGS: CT CHEST FINDINGS Cardiovascular: There is no evidence of thoracic aortic dissection or aneurysm. Normal cardiac size. No pericardial effusion is noted. Thrombosis of the superior vena cava is  noted which is nearly occlusive. Small amount of contrast is seen flowing into the right atrium. This results in multiple collateral veins. Probable thrombotic occlusion of left brachiocephalic vein is noted as well. Mediastinum/Nodes: Previously described SVC thrombosis and severe narrowing appears to be due to ill-defined soft tissue mass in right paratracheal and precarinal area measuring 3.2 x 2.3 cm, as well as right hilar mass or node measuring 2.2 x 1.7 cm. Thyroid gland is unremarkable. Esophagus is unremarkable. Lungs/Pleura: No pneumothorax or pleural effusion is noted. Emphysematous disease is noted in both upper lobes. 2.1 x 2.0 cm irregular mass is noted medially in the right upper lobe concerning for malignancy. Also noted is spiculated density measuring 12 x 9 mm with pleural tail in right upper lobe concerning for possible malignancy. Musculoskeletal: No chest wall mass or suspicious bone lesions identified. CT ABDOMEN PELVIS FINDINGS Hepatobiliary: No focal liver abnormality is seen. No gallstones, gallbladder wall thickening, or biliary dilatation. Pancreas: Unremarkable. No pancreatic ductal dilatation or surrounding inflammatory changes. Spleen: Normal in size without focal abnormality.  Adrenals/Urinary Tract: Adrenal glands are unremarkable. Kidneys are normal, without renal calculi, focal lesion, or hydronephrosis. Bladder is unremarkable. Stomach/Bowel: The stomach appears normal. The appendix is not visualized. There is no evidence of bowel obstruction or inflammation. Vascular/Lymphatic: Atherosclerosis of abdominal aorta is noted without aneurysm or dissection. IVC and iliac veins appear to be patent. However, collateral veins are noted in the anterior subcutaneous tissues of the abdomen. No significant adenopathy is noted in the abdomen or pelvis. Reproductive: Prostate is unremarkable. Other: No abdominal wall hernia or abnormality. No abdominopelvic ascites. Musculoskeletal: No acute or significant osseous findings. IMPRESSION: 2.1 x 2.0 cm irregular mass is noted medially in right upper lobe concerning for malignancy. Also noted is probable right paratracheal, right hilar and precarinal adenopathy or malignancy, which results in severe narrowing and thrombosis of the superior vena cava. This results in multiple collateral veins in the chest and abdomen, as well as probable thrombosis of the left brachycephalic vein. This is consistent with SVC syndrome. PET scan is recommended for further evaluation. Aortic Atherosclerosis (ICD10-I70.0) and Emphysema (ICD10-J43.9). Electronically Signed   By: Marijo Conception, M.D.   On: 03/09/2018 15:19   Mr Jeri Cos BT Contrast  Result Date: 03/10/2018 CLINICAL DATA:  52 y/o  M; lung cancer for staging. EXAM: MRI HEAD WITHOUT AND WITH CONTRAST TECHNIQUE: Multiplanar, multiecho pulse sequences of the brain and surrounding structures were obtained without and with intravenous contrast. CONTRAST:  6 cc Gadavist COMPARISON:  03/09/2018 CT of chest, abdomen, and pelvis. FINDINGS: Brain: No acute infarction, hemorrhage, hydrocephalus, extra-axial collection or mass lesion. Few nonspecific T2 FLAIR hyperintensities in subcortical and periventricular white  matter are compatible with mild chronic microvascular ischemic changes for age. No abnormal enhancement. Vascular: Normal flow voids. Skull and upper cervical spine: Normal marrow signal. Sinuses/Orbits: Moderate mucosal thickening of paranasal sinuses. No abnormal signal of mastoid air cells. Orbits are unremarkable. Other: None. IMPRESSION: 1. No intracranial metastatic disease identified. 2. Mild chronic microvascular ischemic changes of the brain. 3. Moderate paranasal sinus mucosal thickening. Electronically Signed   By: Kristine Garbe M.D.   On: 03/10/2018 03:58   Ct Abdomen Pelvis W Contrast  Result Date: 03/09/2018 CLINICAL DATA:  Facial and neck swelling for several months. Abdominal pain. EXAM: CT CHEST, ABDOMEN, AND PELVIS WITH CONTRAST TECHNIQUE: Multidetector CT imaging of the chest, abdomen and pelvis was performed following the standard protocol during bolus administration of intravenous contrast. CONTRAST:  133mL ISOVUE-300 IOPAMIDOL (ISOVUE-300) INJECTION 61% COMPARISON:  None. FINDINGS: CT CHEST FINDINGS Cardiovascular: There is no evidence of thoracic aortic dissection or aneurysm. Normal cardiac size. No pericardial effusion is noted. 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 Small amount of contrast is seen flowing into the right atrium. This results in multiple collateral veins. Probable thrombotic occlusion of left brachiocephalic vein is noted as well. Mediastinum/Nodes: Previously described SVC thrombosis and severe narrowing appears to be due to ill-defined soft tissue mass in right paratracheal and precarinal area measuring 3.2 x 2.3 cm, as well as right hilar mass or node measuring 2.2 x 1.7 cm. Thyroid gland is unremarkable. Esophagus is unremarkable. Lungs/Pleura: No pneumothorax or pleural effusion is noted. Emphysematous disease is noted in both upper lobes. 2.1 x 2.0 cm irregular mass is noted medially in the right upper lobe concerning  for malignancy. Also noted is spiculated density measuring 12 x 9 mm with pleural tail in right upper lobe concerning for possible malignancy. Musculoskeletal: No chest wall mass or suspicious bone lesions identified. CT ABDOMEN PELVIS FINDINGS Hepatobiliary: No focal liver abnormality is seen. No gallstones, gallbladder wall thickening, or biliary dilatation. Pancreas: Unremarkable. No pancreatic ductal dilatation or surrounding inflammatory changes. Spleen: Normal in size without focal abnormality. Adrenals/Urinary Tract: Adrenal glands are unremarkable. Kidneys are normal, without renal calculi, focal lesion, or hydronephrosis. Bladder is unremarkable. Stomach/Bowel: The stomach appears normal. The appendix is not visualized. There is no evidence of bowel obstruction or inflammation. Vascular/Lymphatic: Atherosclerosis of abdominal aorta is noted without aneurysm or dissection. IVC and iliac veins appear to be patent. However, collateral veins are noted in the anterior subcutaneous tissues of the abdomen. No significant adenopathy is noted in the abdomen or pelvis. Reproductive: Prostate is unremarkable. Other: No abdominal wall hernia or abnormality. No abdominopelvic ascites. Musculoskeletal: No acute or significant osseous findings. IMPRESSION: 2.1 x 2.0 cm irregular mass is noted medially in right upper lobe concerning for malignancy. Also noted is probable right paratracheal, right hilar and precarinal adenopathy or malignancy, which results in severe narrowing and thrombosis of the superior vena cava. This results in multiple collateral veins in the chest and abdomen, as well as probable thrombosis of the left brachycephalic vein. This is consistent with SVC syndrome. PET scan is recommended for further evaluation. Aortic Atherosclerosis (ICD10-I70.0) and Emphysema (ICD10-J43.9). Electronically Signed   By: Marijo Conception, M.D.   On: 03/09/2018 15:19     Assessment and plan- Patient is a 52 y.o. male  with newly diagnosed right upper lobe lung mass as well as mediastinal adenopathy causing SVC thrombosis and SVC syndrome  1.  SVC syndrome/SVC thrombosis: Appreciate vascular surgery recommendations.  At this time plan is to continue heparin drip.  No acute vascular surgery intervention planned.  Eventually patient needs to be switched to an oral anticoagulant.  Patient does not have any medical insurance and it would be challenging to put him on Lovenox or newer anticoagulants which are the preferred anticoagulants and malignancy.  However switch to oral anticoagulants is not recommended today due to reasons below  2.  Patient underwent a bronchoscopy today and I spoke to Dr. Patsey Berthold at our tumor board this afternoon.  She reports that the bronchoscopy was a bloody procedure due to his SVC syndrome and engorged veins.  It is unclear if his biopsy was conclusive.  We will hopefully know that by tomorrow.  She recommended that if biopsy was non diagnostic, mediastinoscopy may be considered.  However I did speak to Dr. Genevive Bi who states that mediastinoscopy would be contraindicated in the presence of SVC syndrome.  In that case I will have to get an inpatient PET scan approved and see if IR would be able to biopsy the right upper lobe mass through a CT-guided biopsy.  I therefore recommend keeping the patient n.p.o. after midnight for possible biopsy.  If biopsy is not done by tomorrow, he may have to potentially stay in the hospital over the weekend for a biopsy on Monday as it would be ideal to get a biopsy while he is on a heparin drip as his anticoagulation cannot be interrupted as an outpatient  3.  I also discussed with the patient that his CT findings are highly concerning for stage III lung cancer and systemic chemotherapy would be warranted in the near future.  Discussed the need for port placement.  We cannot obtain a chest wall port due to SVC syndrome and therefore a thigh port with the reasonable  alternative.  I did speak to Dr. Lucky Cowboy regarding port placement tomorrow and he said he would try his best to get his port done tomorrow.  4. I would recommend the patient should stay inpatient until his port is done and his biopsy is conclusive.  I also touch base with Dr. Donella Stade and asked him to hold off on starting radiation tomorrow until we obtain a definitive biopsy.  Patient is symptomatically feeling better since starting the heparin drip and he is not acutely short of breath.   Total face to face encounter time for this patient visit was 40 min. >50% of the time was  spent in counseling and coordination of care across different specialties.     Visit Diagnosis 1. Malignant neoplasm of right lung, unspecified part of lung ([place])   2. 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 Randa Evens, MD, MPH [facility] at [facility] [phone] 03/11/2018 9:46 PM

## 2018-03-12 ENCOUNTER — Encounter: Payer: Self-pay | Admitting: Pulmonary Disease

## 2018-03-12 ENCOUNTER — Inpatient Hospital Stay: Payer: Self-pay

## 2018-03-12 ENCOUNTER — Ambulatory Visit: Payer: Self-pay

## 2018-03-12 ENCOUNTER — Encounter
Admission: EM | Disposition: A | Discharge status: Home / Self Care | Payer: Self-pay | Source: Home / Self Care | Attending: Internal Medicine

## 2018-03-12 ENCOUNTER — Other Ambulatory Visit: Payer: Self-pay | Admitting: *Deleted

## 2018-03-12 DIAGNOSIS — I871 Compression of vein: Secondary | ICD-10-CM

## 2018-03-12 DIAGNOSIS — C3491 Malignant neoplasm of unspecified part of right bronchus or lung: Secondary | ICD-10-CM

## 2018-03-12 HISTORY — PX: PORTA CATH INSERTION: CATH118285

## 2018-03-12 LAB — CBC
HCT: 36.1 % — ABNORMAL LOW (ref 39.0–52.0)
HEMOGLOBIN: 11.8 g/dL — AB (ref 13.0–17.0)
MCH: 28.9 pg (ref 26.0–34.0)
MCHC: 32.7 g/dL (ref 30.0–36.0)
MCV: 88.5 fL (ref 80.0–100.0)
Platelets: 234 10*3/uL (ref 150–400)
RBC: 4.08 MIL/uL — ABNORMAL LOW (ref 4.22–5.81)
RDW: 12.9 % (ref 11.5–15.5)
WBC: 8.7 10*3/uL (ref 4.0–10.5)
nRBC: 0 % (ref 0.0–0.2)

## 2018-03-12 LAB — HEPARIN LEVEL (UNFRACTIONATED)
HEPARIN UNFRACTIONATED: 0.62 [IU]/mL (ref 0.30–0.70)
Heparin Unfractionated: 0.78 IU/mL — ABNORMAL HIGH (ref 0.30–0.70)

## 2018-03-12 LAB — CYTOLOGY - NON PAP

## 2018-03-12 SURGERY — PORTA CATH INSERTION
Anesthesia: Moderate Sedation

## 2018-03-12 MED ORDER — HEPARIN (PORCINE) IN NACL 100-0.45 UNIT/ML-% IJ SOLN
1500.0000 [IU]/h | INTRAMUSCULAR | Status: DC
  Administered 2018-03-12: 1400 [IU]/h via INTRAVENOUS
  Filled 2018-03-12: qty 250

## 2018-03-12 MED ORDER — FENTANYL CITRATE (PF) 100 MCG/2ML IJ SOLN
INTRAMUSCULAR | Status: DC | PRN
  Administered 2018-03-12: 50 ug via INTRAVENOUS

## 2018-03-12 MED ORDER — VANCOMYCIN HCL IN DEXTROSE 1-5 GM/200ML-% IV SOLN
1000.0000 mg | INTRAVENOUS | Status: AC
  Administered 2018-03-12: 1000 mg via INTRAVENOUS
  Filled 2018-03-12: qty 200

## 2018-03-12 MED ORDER — SODIUM CHLORIDE 0.9% FLUSH: 10.0000 mL | INTRAVENOUS | Status: DC | PRN

## 2018-03-12 MED ORDER — LIDOCAINE-EPINEPHRINE (PF) 1 %-1:200000 IJ SOLN
INTRAMUSCULAR | Status: AC
  Filled 2018-03-12: qty 30

## 2018-03-12 MED ORDER — FENTANYL CITRATE (PF) 100 MCG/2ML IJ SOLN
INTRAMUSCULAR | Status: AC
  Filled 2018-03-12: qty 4

## 2018-03-12 MED ORDER — IPRATROPIUM-ALBUTEROL 0.5-2.5 (3) MG/3ML IN SOLN: 3.0000 mL | RESPIRATORY_TRACT | Status: DC | PRN

## 2018-03-12 MED ORDER — FENTANYL CITRATE (PF) 100 MCG/2ML IJ SOLN
INTRAMUSCULAR | Status: AC
  Filled 2018-03-12: qty 2

## 2018-03-12 MED ORDER — HEPARIN (PORCINE) IN NACL 100-0.45 UNIT/ML-% IJ SOLN: 1400.0000 [IU]/h | INTRAMUSCULAR | Status: DC

## 2018-03-12 MED ORDER — SODIUM CHLORIDE 0.9% FLUSH
10.0000 mL | Freq: Two times a day (BID) | INTRAVENOUS | Status: DC
  Administered 2018-03-12 – 2018-03-13 (×3): 10 mL

## 2018-03-12 MED ORDER — MIDAZOLAM HCL 5 MG/5ML IJ SOLN
INTRAMUSCULAR | Status: AC
  Filled 2018-03-12: qty 5

## 2018-03-12 MED ORDER — SODIUM CHLORIDE 0.9 % IV SOLN
INTRAVENOUS | Status: DC
  Administered 2018-03-12: 10:00:00 via INTRAVENOUS

## 2018-03-12 MED ORDER — SODIUM CHLORIDE 0.9 % IV SOLN
INTRAVENOUS | Status: DC
  Administered 2018-03-12: 15:00:00 via INTRAVENOUS

## 2018-03-12 MED ORDER — MIDAZOLAM HCL 2 MG/2ML IJ SOLN
INTRAMUSCULAR | Status: DC | PRN
  Administered 2018-03-12: 2 mg via INTRAVENOUS

## 2018-03-12 MED ORDER — HEPARIN (PORCINE) IN NACL 100-0.45 UNIT/ML-% IJ SOLN: 1450.0000 [IU]/h | INTRAMUSCULAR | Status: DC

## 2018-03-12 MED ORDER — WARFARIN - PHARMACIST DOSING INPATIENT: Freq: Every day | Status: DC

## 2018-03-12 MED ORDER — MIDAZOLAM HCL 5 MG/5ML IJ SOLN
INTRAMUSCULAR | Status: AC | PRN
  Administered 2018-03-12: 1 mg via INTRAVENOUS
  Administered 2018-03-12: 0.5 mg via INTRAVENOUS

## 2018-03-12 MED ORDER — FENTANYL CITRATE (PF) 100 MCG/2ML IJ SOLN
INTRAMUSCULAR | Status: AC | PRN
  Administered 2018-03-12: 50 ug via INTRAVENOUS

## 2018-03-12 MED ORDER — HEPARIN (PORCINE) IN NACL 1000-0.9 UT/500ML-% IV SOLN
INTRAVENOUS | Status: AC
  Filled 2018-03-12: qty 500

## 2018-03-12 MED ORDER — WARFARIN SODIUM 5 MG PO TABS
5.0000 mg | ORAL_TABLET | Freq: Once | ORAL | Status: AC
  Administered 2018-03-12: 5 mg via ORAL
  Filled 2018-03-12: qty 1

## 2018-03-12 SURGICAL SUPPLY — 13 items
DERMABOND ADVANCED (GAUZE/BANDAGES/DRESSINGS) ×2
DERMABOND ADVANCED .7 DNX12 (GAUZE/BANDAGES/DRESSINGS) ×1 IMPLANT
DEVICE TORQUE .025-.038 (MISCELLANEOUS) ×3 IMPLANT
DRAPE INCISE IOBAN 66X45 STRL (DRAPES) ×3 IMPLANT
GUIDEWIRE ANGLED .035 180CM (WIRE) ×3 IMPLANT
KIT PORT POWER 8FR ISP CVUE (Port) ×3 IMPLANT
NEEDLE ENTRY 21GA 7CM ECHOTIP (NEEDLE) ×3 IMPLANT
PACK ANGIOGRAPHY (CUSTOM PROCEDURE TRAY) ×3 IMPLANT
SET INTRO CAPELLA COAXIAL (SET/KITS/TRAYS/PACK) ×3 IMPLANT
SUT MNCRL AB 4-0 PS2 18 (SUTURE) ×3 IMPLANT
SUT PROLENE 0 CT 1 30 (SUTURE) ×3 IMPLANT
SUT VIC AB 3-0 SH 27 (SUTURE) ×2
SUT VIC AB 3-0 SH 27X BRD (SUTURE) ×1 IMPLANT

## 2018-03-12 NOTE — Progress Notes (Signed)
Patient ID: Callum Wolf, male   DOB: 1965/12/03, 52 y.o.   MRN: [id number]  [employer] PROGRESS NOTE  Clarnce Homan EKB:[id number] DOB: 04/06/1966 DOA: 03/09/2018 PCP: Patient, No Pcp Per  HPI/Subjective: Patient was walking around the hallway and wondered why he was without food.  I came into the room and told him that he would have a port placement in the lower extremity and potential CT-guided biopsy.  He will be able to eat after that.  Objective: Vitals:   03/12/18 1228 03/12/18 1244  BP: 104/71 114/73  Pulse: 78 77  Resp: 20 19  Temp:  98.2 F (36.8 C)  SpO2: 96% 97%    Filed Weights   03/09/18 1818 03/11/18 1022 03/12/18 1025  Weight: 61.8 kg 61.8 kg 61.8 kg    ROS: Review of Systems  Constitutional: Negative for chills and fever.  Eyes: Negative for blurred vision.  Respiratory: Negative for cough and shortness of breath.   Cardiovascular: Positive for chest pain.  Gastrointestinal: Negative for abdominal pain, constipation, diarrhea, nausea and vomiting.  Genitourinary: Negative for dysuria.  Musculoskeletal: Negative for joint pain.  Neurological: Negative for dizziness and headaches.   Exam: Physical Exam  Constitutional: He is oriented to person, place, and time.  HENT:  Nose: No mucosal edema.  Mouth/Throat: No oropharyngeal exudate or posterior oropharyngeal edema.  Eyes: Pupils are equal, round, and reactive to light. Conjunctivae, EOM and lids are normal.  Neck: No JVD present. Carotid bruit is not present. No edema present. No thyroid mass and no thyromegaly present.  Cardiovascular: S1 normal and S2 normal. Exam reveals no gallop.  No murmur heard. Pulses:      Dorsalis pedis pulses are 2+ on the right side, and 2+ on the left side.  Respiratory: No respiratory distress. He has no wheezes. He has no rhonchi. He has no rales.  GI: Soft. Bowel sounds are normal. There is no tenderness.  Musculoskeletal:       Right ankle: He exhibits no  swelling.       Left ankle: He exhibits no swelling.  Lymphadenopathy:    He has no cervical adenopathy.  Neurological: He is alert and oriented to person, place, and time. No cranial nerve deficit.  Skin: Skin is warm. No rash noted. Nails show no clubbing.  Psychiatric: He has a normal mood and affect.      Data Reviewed: Basic Metabolic Panel: Recent Labs  Lab 03/09/18 1051 03/10/18 0708  NA 140 139  K 4.2 3.9  CL 101 102  CO2 28 28  GLUCOSE 103* 95  BUN 10 10  CREATININE 0.88 0.95  CALCIUM 9.1 9.0   Liver Function Tests: Recent Labs  Lab 03/09/18 1051  AST 14*  ALT 9  ALKPHOS 103  BILITOT 0.5  PROT 7.6  ALBUMIN 4.0   CBC: Recent Labs  Lab 03/09/18 1051 03/10/18 0708 03/11/18 0342 03/12/18 0420  WBC 10.0 7.6 7.8 8.7  HGB 12.7* 11.6* 11.9* 11.8*  HCT 40.2 36.5* 36.6* 36.1*  MCV 90.1 89.9 89.5 88.5  PLT 258 233 226 234   Cardiac Enzymes: Recent Labs  Lab 03/09/18 1426  TROPONINI <0.03     Studies: No results found.  Scheduled Meds: . sodium chloride flush  10-40 mL Intracatheter Q12H  . sodium chloride flush  3 mL Intravenous Q12H   Continuous Infusions: . sodium chloride    . heparin      Assessment/Plan:  1. SVC syndrome.  Swelling of the upper extremity seems  a little bit better in the upper arms.  Radiation oncology consultation appreciated. 2. 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  Heparin drip to be restarted this afternoon after procedures.  Likely can go home with Lovenox injections and Coumadin start over the weekend but would like to have a good tissue sample before we start Coumadin and Lovenox.  Follow-up with Dr. Janese Banks as outpatient for INR checks. 3. Lung nodules concerning for malignancy.  Status post bronchoscopy that was non-diagnostic.  Will need CT-guided biopsy to get tissue type today. 4. Wheeze has improved with nebulizers 5. Tobacco abuse.  Patient refused nicotine patch.  Code Status:     Code Status Orders  (From  admission, onward)         Start     Ordered   03/09/18 1812  Full code  Continuous     03/09/18 1811        Code Status History    This patient has a current code status but no historical code status.     Family Communication: Girlfriend at bedside Disposition Plan: To be determined based on good tissue biopsy for lung mass.  Consultants:  Oncology  Radiation oncology  Vascular surgery  Procedures:  Bronchoscopy  Port placement  Time spent: 27 minutes  [place]

## 2018-03-12 NOTE — Progress Notes (Signed)
Port information and card for port  given to patient and wife

## 2018-03-12 NOTE — Op Note (Signed)
OPERATIVE NOTE   PROCEDURE: 1. Placement of a right femoral Infuse-a-Port  PRE-OPERATIVE DIAGNOSIS: Superior vena cava syndrome; malignant neoplasm right lung  POST-OPERATIVE DIAGNOSIS: Same  SURGEON: Katha Cabal M.D.  ANESTHESIA: Conscious sedation was administered under my direct supervision by the interventional radiology RN. IV Versed plus fentanyl were utilized. Continuous ECG, pulse oximetry and blood pressure was monitored throughout the entire procedure. Conscious sedation was for a total of 30 minutes.  ESTIMATED BLOOD LOSS: Minimal   FINDING(S): 1.  Patent vein  SPECIMEN(S): None  INDICATIONS:   Alexander [name] is a 52 y.o. 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  He will require chemotherapy and therefore appropriate intravenous access.  Because of his superior vena cava syndrome jugular access is not available.  Therefore we will proceed with a right thigh access.  Risks and benefits of been reviewed all questions been answered patient agrees to proceed..  DESCRIPTION: After obtaining full informed written consent, the patient was brought back to the special procedure suite and placed in the supine position. The patient's right groin and thigh are prepped and draped in sterile fashion. Appropriate timeout was called.  Ultrasound is placed in a sterile sleeve, ultrasound is utilized to avoid vascular injury as well as secondary to lack of appropriate landmarks. The right femoral vein is identified. It is echolucent and homogeneous as well as easily compressible indicating patency. An image is recorded for the permanent record.  Access to the vein with a micropuncture needle is done under direct ultrasound visualization.  1% lidocaine is infiltrated into the soft tissue of the right thigh.  Under direct ultrasound visualization a micro-needle is inserted into the vein followed by the micro-wire. Micro-sheath was then advanced and a  J wire is inserted without difficulty under fluoroscopic guidance. A small counterincision was created at the wire insertion site. A transverse incision is created 2 fingerbreadths below the scapula and a pocket is fashioned using both blunt and sharp dissection. The pocket is tested for appropriate size with the hub of the Infuse-a-Port. The tunneling device is then used to pull the intravascular portion of the catheter from the pocket to the groin counterincision.  Dilator and peel-away sheath were then inserted over the wire and the wire is removed. Catheter is then advanced into the venous system without difficulty. Peel-away sheath was then removed.  Catheter is then positioned under fluoroscopic guidance at the atrial caval junction. It is then transected connected to the hub and the hope is slipped into the subcutaneous pocket on the thigh. The hub was then accessed percutaneously and aspirates easily and flushes well and is flushed with 30 cc of heparinized saline. The pocket incision is then closed in layers using interrupted 3-0 Vicryl for the subcutaneous tissues and 4-0 Monocryl subcuticular for skin closure. Dermabond is applied. The neck counterincision was closed with 4-0 Monocryl subcuticular and Dermabond as well.  The patient tolerated the procedure well and there were no immediate complications.  COMPLICATIONS: None  CONDITION: Unchanged  Katha Cabal M.D. [facility] Office: [phone]   03/12/2018, 11:56 AM

## 2018-03-12 NOTE — Progress Notes (Signed)
[place] for heparin/warfarin Indication: SVC thrombosis  Allergies  Allergen Reactions  . Penicillins     Patient Measurements: Height: 5\' 9"  (175.3 cm) Weight: 136 lb 3.2 oz (61.8 kg) IBW/kg (Calculated) : 70.7 Heparin Dosing Weight: 62.6 kg  Vital Signs: Temp: 98.2 F (36.8 C) (10/18 1244) Temp Source: Oral (10/18 1244) BP: 113/67 (10/18 1655) Pulse Rate: 88 (10/18 1655)  Labs: Recent Labs    03/10/18 0708  03/11/18 0342 03/11/18 2102 03/12/18 0420 03/12/18 1014  HGB 11.6*  --  11.9*  --  11.8*  --   HCT 36.5*  --  36.6*  --  36.1*  --   PLT 233  --  226  --  234  --   HEPARINUNFRC 0.13*   < > 0.53 0.23* 0.62 0.78*  CREATININE 0.95  --   --   --   --   --    < > = values in this interval not displayed.    Estimated Creatinine Clearance: 78.6 mL/min (by C-G formula based on SCr of 0.95 mg/dL).   Medical History: Past Medical History:  Diagnosis Date  . COPD (chronic obstructive pulmonary disease) ([place])   . Lung mass     Assessment: 52 yo male to start on heparin drip for SVC thrombosis. No PTA meds on med rec  Goal of Therapy:  INR 2 - 3 Heparin level 0.3-0.7 units/ml Monitor platelets by anticoagulation protocol: Yes   Plan:  10/18 10:14 Heparin level 0.78. Patient went for port placement and Heparin drip was stopped for procedure. Patient then went for biopsy. Will restart Heparin 1400 units/hr at 23:00. Will begin Warfarin 5mg  tonight. Daily INR checks.  Pharmacy will continue to follow.   Paulina Fusi, PharmD, BCPS 03/12/2018 5:17 PM

## 2018-03-12 NOTE — Progress Notes (Signed)
Dr. Delana Meyer out to bedside now to speak with pt. Re: port insert to right thigh. Pharmacy called per MD orders to resume Heparin gtt. (no Bolus) at 13:45 per previous rate. Pt. Without any c/o pain, SOB,N/V, HA, dizziness.

## 2018-03-12 NOTE — Progress Notes (Signed)
[place] for heparin Indication: SVC thrombosis  Allergies  Allergen Reactions  . Penicillins     Patient Measurements: Height: 5\' 9"  (175.3 cm) Weight: 136 lb 3.2 oz (61.8 kg) IBW/kg (Calculated) : 70.7 Heparin Dosing Weight: 62.6 kg  Vital Signs: Temp: 98.2 F (36.8 C) (10/18 1244) Temp Source: Oral (10/18 1244) BP: 114/73 (10/18 1244) Pulse Rate: 77 (10/18 1244)  Labs: Recent Labs    03/09/18 1426 03/09/18 1608  03/10/18 0708  03/11/18 0342 03/11/18 2102 03/12/18 0420 03/12/18 1014  HGB  --   --    < > 11.6*  --  11.9*  --  11.8*  --   HCT  --   --   --  36.5*  --  36.6*  --  36.1*  --   PLT  --   --   --  233  --  226  --  234  --   APTT  --  34  --   --   --   --   --   --   --   LABPROT  --  13.2  --   --   --   --   --   --   --   INR  --  1.01  --   --   --   --   --   --   --   HEPARINUNFRC  --   --    < > 0.13*   < > 0.53 0.23* 0.62 0.78*  CREATININE  --   --   --  0.95  --   --   --   --   --   TROPONINI <0.03  --   --   --   --   --   --   --   --    < > = values in this interval not displayed.    Estimated Creatinine Clearance: 78.6 mL/min (by C-G formula based on SCr of 0.95 mg/dL).   Medical History: Past Medical History:  Diagnosis Date  . COPD (chronic obstructive pulmonary disease) ([place])   . Lung mass     Assessment: 52 yo male to start on heparin drip for SVC thrombosis. No PTA meds on med rec  Goal of Therapy:  Heparin level 0.3-0.7 units/ml Monitor platelets by anticoagulation protocol: Yes   Plan:  10/18 10:14 Heparin level 0.78. Patient went for port placement and Heparin drip was stopped for procedure. Patient may go for a biopsy later this afternoon so Heparin continues on HOLD for now. Will follow up on plan later this afternoon. Will restart at 1400 units/hr.  Pharmacy will continue to follow.   Paulina Fusi, PharmD, BCPS 03/12/2018 1:49 PM

## 2018-03-12 NOTE — Procedures (Signed)
Interventional Radiology Procedure Note  Procedure: CT guided biopsy of RUL pulmonary nodule Complications: No immediate.  Did not enter lung, approached from pleura Recommendations: - Bedrest until CXR cleared.  Minimize talking, coughing or otherwise straining.  - Follow up 2 hr CXR pending   Signed,  Criselda Peaches, MD

## 2018-03-12 NOTE — Progress Notes (Signed)
[place] for heparin Indication: SVC thrombosis  Allergies  Allergen Reactions  . Penicillins     Patient Measurements: Height: 5\' 9"  (175.3 cm) Weight: 136 lb 3.2 oz (61.8 kg) IBW/kg (Calculated) : 70.7 Heparin Dosing Weight: 62.6 kg  Vital Signs: Temp: 98.1 F (36.7 C) (10/18 0611) Temp Source: Oral (10/18 0611) BP: 118/68 (10/18 0611) Pulse Rate: 93 (10/18 0611)  Labs: Recent Labs    03/09/18 1051 03/09/18 1426 03/09/18 1608  03/10/18 0708  03/11/18 0342 03/11/18 2102 03/12/18 0420  HGB 12.7*  --   --   --  11.6*  --  11.9*  --  11.8*  HCT 40.2  --   --   --  36.5*  --  36.6*  --  36.1*  PLT 258  --   --   --  233  --  226  --  234  APTT  --   --  34  --   --   --   --   --   --   LABPROT  --   --  13.2  --   --   --   --   --   --   INR  --   --  1.01  --   --   --   --   --   --   HEPARINUNFRC  --   --   --    < > 0.13*   < > 0.53 0.23* 0.62  CREATININE 0.88  --   --   --  0.95  --   --   --   --   TROPONINI  --  <0.03  --   --   --   --   --   --   --    < > = values in this interval not displayed.    Estimated Creatinine Clearance: 78.6 mL/min (by C-G formula based on SCr of 0.95 mg/dL).   Medical History: Past Medical History:  Diagnosis Date  . COPD (chronic obstructive pulmonary disease) ([place])   . Lung mass     Assessment: 52 yo male to start on heparin drip for SVC thrombosis. No PTA meds on med rec  Goal of Therapy:  Heparin level 0.3-0.7 units/ml Monitor platelets by anticoagulation protocol: Yes   Plan:  10/17 Heparin drip was stopped at 08:00 for scheduled bronch. Patient is now s/p bronch and Heparin drip will resume at previous rate of 1350 units/hr at 15:00, no bolus per MD request. Will check Heparin level 6 hours after restart. Daily CBC while on Heparin drip.  10/17:  HL @ 21:00 = 0.23 Will order Heparin 900 units IV X 1 bolus and increase drip rate to 1450 units/hr. Will recheck HL 6 hrs  after rate change.   10/18 0400 heparin level 0.62. Recheck in 6 hours to confirm.  Pharmacy will continue to follow.   Jann Milkovich S 03/12/2018 6:43 AM

## 2018-03-13 LAB — PROTIME-INR
INR: 1.04
PROTHROMBIN TIME: 13.5 s (ref 11.4–15.2)

## 2018-03-13 LAB — CBC
HCT: 35.8 % — ABNORMAL LOW (ref 39.0–52.0)
Hemoglobin: 11.3 g/dL — ABNORMAL LOW (ref 13.0–17.0)
MCH: 28.4 pg (ref 26.0–34.0)
MCHC: 31.6 g/dL (ref 30.0–36.0)
MCV: 89.9 fL (ref 80.0–100.0)
NRBC: 0.4 % — AB (ref 0.0–0.2)
Platelets: 222 10*3/uL (ref 150–400)
RBC: 3.98 MIL/uL — ABNORMAL LOW (ref 4.22–5.81)
RDW: 13.2 % (ref 11.5–15.5)
WBC: 11.4 10*3/uL — AB (ref 4.0–10.5)

## 2018-03-13 LAB — HEPARIN LEVEL (UNFRACTIONATED)
Heparin Unfractionated: 0.24 IU/mL — ABNORMAL LOW (ref 0.30–0.70)
Heparin Unfractionated: 0.62 IU/mL (ref 0.30–0.70)

## 2018-03-13 MED ORDER — WARFARIN SODIUM 5 MG PO TABS
5.0000 mg | ORAL_TABLET | Freq: Every day | ORAL | Status: DC
  Filled 2018-03-13: qty 1

## 2018-03-13 MED ORDER — WARFARIN SODIUM 7.5 MG PO TABS: 7.5000 mg | ORAL_TABLET | Freq: Every day | ORAL | 0 refills | Status: DC

## 2018-03-13 MED ORDER — HEPARIN SOD (PORK) LOCK FLUSH 100 UNIT/ML IV SOLN
500.0000 [IU] | Freq: Once | INTRAVENOUS | Status: AC
  Administered 2018-03-13: 15:00:00 500 [IU] via INTRAVENOUS
  Filled 2018-03-13: qty 5

## 2018-03-13 MED ORDER — OXYCODONE-ACETAMINOPHEN 5-325 MG PO TABS: 1.0000 | ORAL_TABLET | ORAL | 0 refills | Status: DC | PRN

## 2018-03-13 MED ORDER — ENOXAPARIN SODIUM 30 MG/0.3ML ~~LOC~~ SOLN: 1.0000 mg/kg | Freq: Two times a day (BID) | SUBCUTANEOUS | 0 refills | Status: DC

## 2018-03-13 MED ORDER — HEPARIN BOLUS VIA INFUSION
900.0000 [IU] | Freq: Once | INTRAVENOUS | Status: AC
  Administered 2018-03-13: 07:00:00 900 [IU] via INTRAVENOUS
  Filled 2018-03-13: qty 900

## 2018-03-13 NOTE — Progress Notes (Signed)
Pt being discharged home, discharge instructions reviewed with pt and sig other, states understanding, pt with no complaints at discharge

## 2018-03-13 NOTE — Progress Notes (Signed)
Pt and family member educated on lovenox injections, pt able to demonstrate proper technique

## 2018-03-13 NOTE — Care Management Note (Signed)
Case Management Note  Patient Details  Name: Alexander [name] MRN: [id number] Date of Birth: 1966/03/06  Subjective/Objective:       Set patient up via [facility] program for scripts.              Action/Plan:   Expected Discharge Date:  03/13/18               Expected Discharge Plan:  Home/Self Care  In-House Referral:     Discharge planning Services  [facility] Program  Post Acute Care Choice:    Choice offered to:     DME Arranged:    DME Agency:     HH Arranged:    HH Agency:     Status of Service:  Completed, signed off  If discussed at [employer] of Stay Meetings, dates discussed:    Additional Comments:  Latanya Maudlin, RN 03/13/2018, 1:23 PM

## 2018-03-13 NOTE — Discharge Instructions (Signed)
If you have an increase in swelling or shortness of breath go to hospital ED immediately

## 2018-03-13 NOTE — Discharge Summary (Signed)
Alexander [name] at [facility], [place] y.o., DOB Jan 28, 1966, MRN [id number]. 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  [name] was evaluated in the ED and was noted to have a mass in the superior vena cava thrombosis.  He was admitted for further evaluation and therapy.  He was seen in consultation by vascular surgery, oncology and radiation oncology.  [name] underwent a Port-A-Cath placement placement he also had a biopsy of the lung mass.  [name] was very anxious to go home today.  I discussed the case with Dr. Janese Banks who agreed that [name] can be discharged today with Lovenox and Coumadin.  Which is being arranged.  I was also told by Dr. Janese Banks that they will be contacting [name] on Monday to start radiation therapy.  [name] also explained that if he starts getting short of breath starts developing further swelling he denies to come to the emergency room.  Currently he is asymptomatic and states that he is feeling well.           Consults  hematology/oncology  Significant Tests:  See full reports for [name] details     Ct Chest W Contrast  Result Date: 03/09/2018 CLINICAL DATA:  Facial and neck swelling for several months. Abdominal pain. EXAM: CT CHEST, ABDOMEN, AND PELVIS WITH CONTRAST TECHNIQUE: Multidetector CT imaging of the chest, abdomen and pelvis was performed following the standard protocol during bolus  administration of intravenous contrast. CONTRAST:  161mL ISOVUE-300 IOPAMIDOL (ISOVUE-300) INJECTION 61% COMPARISON:  None. FINDINGS: CT CHEST FINDINGS Cardiovascular: There is no evidence of thoracic aortic dissection or aneurysm. Normal cardiac size. No pericardial effusion is noted. 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 Small amount of contrast is seen flowing into the right atrium. This results in multiple collateral veins. Probable thrombotic occlusion of left brachiocephalic vein is noted as well. Mediastinum/Nodes: Previously described SVC thrombosis and severe narrowing appears to be due to ill-defined soft tissue mass in right paratracheal and precarinal area measuring 3.2 x 2.3 cm, as well as right hilar mass or node measuring 2.2 x 1.7 cm. Thyroid gland is unremarkable. Esophagus is unremarkable. Lungs/Pleura: No pneumothorax or pleural effusion is noted. Emphysematous disease is noted in both upper lobes. 2.1 x 2.0 cm irregular mass is noted medially in the right upper lobe concerning for malignancy. Also noted is spiculated density measuring 12 x 9 mm with pleural tail in right upper lobe concerning for possible malignancy. Musculoskeletal: No chest wall mass or suspicious bone lesions identified. CT ABDOMEN PELVIS FINDINGS Hepatobiliary: No focal liver abnormality is seen. No gallstones, gallbladder wall thickening, or biliary dilatation. Pancreas: Unremarkable. No pancreatic ductal dilatation or surrounding inflammatory changes. Spleen: Normal in size without focal abnormality. Adrenals/Urinary Tract: Adrenal glands are unremarkable. Kidneys are normal, without renal calculi, focal lesion, or hydronephrosis. Bladder is unremarkable. Stomach/Bowel: The stomach appears normal. The appendix is not visualized. There is no evidence of bowel obstruction or inflammation. Vascular/Lymphatic: Atherosclerosis of abdominal aorta is noted without  aneurysm or dissection. IVC and  iliac veins appear to be patent. However, collateral veins are noted in the anterior subcutaneous tissues of the abdomen. No significant adenopathy is noted in the abdomen or pelvis. Reproductive: Prostate is unremarkable. Other: No abdominal wall hernia or abnormality. No abdominopelvic ascites. Musculoskeletal: No acute or significant osseous findings. IMPRESSION: 2.1 x 2.0 cm irregular mass is noted medially in right upper lobe concerning for malignancy. Also noted is probable right paratracheal, right hilar and precarinal adenopathy or malignancy, which results in severe narrowing and thrombosis of the superior vena cava. This results in multiple collateral veins in the chest and abdomen, as well as probable thrombosis of the left brachycephalic vein. This is consistent with SVC syndrome. PET scan is recommended for further evaluation. Aortic Atherosclerosis (ICD10-I70.0) and Emphysema (ICD10-J43.9). Electronically Signed   By: Marijo Conception, M.D.   On: 03/09/2018 15:19   Mr Jeri Cos UY Contrast  Result Date: 03/10/2018 CLINICAL DATA:  52 y/o  M; lung cancer for staging. EXAM: MRI HEAD WITHOUT AND WITH CONTRAST TECHNIQUE: Multiplanar, multiecho pulse sequences of the brain and surrounding structures were obtained without and with intravenous contrast. CONTRAST:  6 cc Gadavist COMPARISON:  03/09/2018 CT of chest, abdomen, and pelvis. FINDINGS: Brain: No acute infarction, hemorrhage, hydrocephalus, extra-axial collection or mass lesion. Few nonspecific T2 FLAIR hyperintensities in subcortical and periventricular white matter are compatible with mild chronic microvascular ischemic changes for age. No abnormal enhancement. Vascular: Normal flow voids. Skull and upper cervical spine: Normal marrow signal. Sinuses/Orbits: Moderate mucosal thickening of paranasal sinuses. No abnormal signal of mastoid air cells. Orbits are unremarkable. Other: None. IMPRESSION: 1. No intracranial metastatic disease identified. 2.  Mild chronic microvascular ischemic changes of the brain. 3. Moderate paranasal sinus mucosal thickening. Electronically Signed   By: Kristine Garbe M.D.   On: 03/10/2018 03:58   Ct Abdomen Pelvis W Contrast  Result Date: 03/09/2018 CLINICAL DATA:  Facial and neck swelling for several months. Abdominal pain. EXAM: CT CHEST, ABDOMEN, AND PELVIS WITH CONTRAST TECHNIQUE: Multidetector CT imaging of the chest, abdomen and pelvis was performed following the standard protocol during bolus administration of intravenous contrast. CONTRAST:  148mL ISOVUE-300 IOPAMIDOL (ISOVUE-300) INJECTION 61% COMPARISON:  None. FINDINGS: CT CHEST FINDINGS Cardiovascular: There is no evidence of thoracic aortic dissection or aneurysm. Normal cardiac size. No pericardial effusion is noted. 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 Small amount of contrast is seen flowing into the right atrium. This results in multiple collateral veins. Probable thrombotic occlusion of left brachiocephalic vein is noted as well. Mediastinum/Nodes: Previously described SVC thrombosis and severe narrowing appears to be due to ill-defined soft tissue mass in right paratracheal and precarinal area measuring 3.2 x 2.3 cm, as well as right hilar mass or node measuring 2.2 x 1.7 cm. Thyroid gland is unremarkable. Esophagus is unremarkable. Lungs/Pleura: No pneumothorax or pleural effusion is noted. Emphysematous disease is noted in both upper lobes. 2.1 x 2.0 cm irregular mass is noted medially in the right upper lobe concerning for malignancy. Also noted is spiculated density measuring 12 x 9 mm with pleural tail in right upper lobe concerning for possible malignancy. Musculoskeletal: No chest wall mass or suspicious bone lesions identified. CT ABDOMEN PELVIS FINDINGS Hepatobiliary: No focal liver abnormality is seen. No gallstones, gallbladder wall thickening, or biliary dilatation. Pancreas: Unremarkable. No  pancreatic ductal dilatation or surrounding inflammatory changes. Spleen: Normal in size without focal abnormality. Adrenals/Urinary Tract: Adrenal glands are unremarkable.  Kidneys are normal, without renal calculi, focal lesion, or hydronephrosis. Bladder is unremarkable. Stomach/Bowel: The stomach appears normal. The appendix is not visualized. There is no evidence of bowel obstruction or inflammation. Vascular/Lymphatic: Atherosclerosis of abdominal aorta is noted without aneurysm or dissection. IVC and iliac veins appear to be patent. However, collateral veins are noted in the anterior subcutaneous tissues of the abdomen. No significant adenopathy is noted in the abdomen or pelvis. Reproductive: Prostate is unremarkable. Other: No abdominal wall hernia or abnormality. No abdominopelvic ascites. Musculoskeletal: No acute or significant osseous findings. IMPRESSION: 2.1 x 2.0 cm irregular mass is noted medially in right upper lobe concerning for malignancy. Also noted is probable right paratracheal, right hilar and precarinal adenopathy or malignancy, which results in severe narrowing and thrombosis of the superior vena cava. This results in multiple collateral veins in the chest and abdomen, as well as probable thrombosis of the left brachycephalic vein. This is consistent with SVC syndrome. PET scan is recommended for further evaluation. Aortic Atherosclerosis (ICD10-I70.0) and Emphysema (ICD10-J43.9). Electronically Signed   By: Marijo Conception, M.D.   On: 03/09/2018 15:19   Dg Chest Port 1 View  Result Date: 03/12/2018 CLINICAL DATA:  Lung biopsy on 03/13/2018 EXAM: PORTABLE CHEST 1 VIEW COMPARISON:  03/09/2018 CT FINDINGS: No pneumothorax is identified. Emphysematous hyperinflation of the lungs is noted. The medial right upper lobe 2 cm nodular opacity as seen on prior chest CT is difficult to identify radiographically, likely from superimposition of pulmonary vasculature and mediastinum. No aggressive  osseous lesions. Subchondral cyst of the glenoid on the right. IMPRESSION: No pneumothorax status post lung biopsy.  COPD. Electronically Signed   By: Ashley Royalty M.D.   On: 03/12/2018 18:29       Today   Subjective:   Alexander [name] feeling better shortness of breath improved Objective:   Blood pressure 127/84, pulse 76, temperature 97.7 F (36.5 C), temperature source Oral, resp. rate 18, height 5\' 9"  (1.753 m), weight 61.8 kg, SpO2 95 %.  .  Intake/Output Summary (Last 24 hours) at 03/13/2018 1507 Last data filed at 03/13/2018 1043 Gross per 24 hour  Intake 426.07 ml  Output -  Net 426.07 ml    Exam VITAL SIGNS: Blood pressure 127/84, pulse 76, temperature 97.7 F (36.5 C), temperature source Oral, resp. rate 18, height 5\' 9"  (1.753 m), weight 61.8 kg, SpO2 95 %.  GENERAL:  Alexander y.o.-year-old [name] lying in the bed with no acute distress.  EYES: Pupils equal, round, reactive to light and accommodation. No scleral icterus. Extraocular muscles intact.  HEENT: Head atraumatic, normocephalic. Oropharynx and nasopharynx clear.  NECK:  Supple, no jugular venous distention. No thyroid enlargement, no tenderness.  LUNGS: Normal breath sounds bilaterally, no wheezing, rales,rhonchi or crepitation. No use of accessory muscles of respiration.  CARDIOVASCULAR: S1, S2 normal. No murmurs, rubs, or gallops.  ABDOMEN: Soft, nontender, nondistended. Bowel sounds present. No organomegaly or mass.  EXTREMITIES: No pedal edema, cyanosis, or clubbing.  NEUROLOGIC: Cranial nerves II through XII are intact. Muscle strength 5/5 in [name] extremities. Sensation intact. Gait not checked.  PSYCHIATRIC: The [name] is alert and oriented x 3.  SKIN: Chest swelling decreased  data Review     CBC w Diff:  Lab Results  Component Value Date   WBC 11.4 (H) 03/13/2018   HGB 11.3 (L) 03/13/2018   HCT 35.8 (L) 03/13/2018   PLT 222 03/13/2018   CMP:  Lab Results  Component Value Date   NA 139 03/10/2018  K 3.9 03/10/2018   CL 102 03/10/2018   CO2 28 03/10/2018   BUN 10 03/10/2018   CREATININE 0.95 03/10/2018   PROT 7.6 03/09/2018   ALBUMIN 4.0 03/09/2018   BILITOT 0.5 03/09/2018   ALKPHOS 103 03/09/2018   AST 14 (L) 03/09/2018   ALT 9 03/09/2018  .  Micro Results No results found for this or any previous visit (from the past 240 hour(s)).      Code Status Orders  (From admission, onward)         Start     Ordered   03/09/18 1812  Full code  Continuous     03/09/18 1811        Code Status History    This [name] has a current code status but no historical code status.          Follow-up Information    Sindy Guadeloupe, MD Follow up on 03/15/2018.   Specialty:  Oncology Contact information: [place] [id number] [phone]           Discharge Medications   Allergies as of 03/13/2018      Reactions   Penicillins       Medication List    TAKE these medications   enoxaparin 30 MG/0.3ML injection Commonly known as:  LOVENOX Inject 0.62 mLs (62 mg total) into the skin 2 (two) times daily for 10 days.   oxyCODONE-acetaminophen 5-325 MG tablet Commonly known as:  PERCOCET/ROXICET Take 1-2 tablets by mouth every 4 (four) hours as needed for moderate pain.   warfarin 7.5 MG tablet Commonly known as:  COUMADIN Take 1 tablet (7.5 mg total) by mouth daily for 20 days.          Total Time in preparing paper work, data evaluation and todays exam - 3 minutes  Dustin Flock M.D on 03/13/2018 at 3:07 PM [place]  [phone]

## 2018-03-13 NOTE — Progress Notes (Signed)
Hematology/Oncology Consult note [facility]  [phone] [phone]  Patient Care Team: Patient, No Pcp Per as PCP - General ([place]) Telford Nab, RN as Registered Nurse   Name of the patient: Alexander [name]  [id number]  [birth year]   Date of visit: 03/13/2018   Interval history- neck veins are still distended. He does not report any pain or sob. Facial plethora is better  ECOG PS- 0 Pain scale- 0   Review of systems- Review of Systems  Constitutional: Negative for chills, fever, malaise/fatigue and weight loss.  HENT: Negative for congestion, ear discharge and nosebleeds.   Eyes: Negative for blurred vision.  Respiratory: Negative for cough, hemoptysis, sputum production, shortness of breath and wheezing.   Cardiovascular: Negative for chest pain, palpitations, orthopnea and claudication.  Gastrointestinal: Negative for abdominal pain, blood in stool, constipation, diarrhea, heartburn, melena, nausea and vomiting.  Genitourinary: Negative for dysuria, flank pain, frequency, hematuria and urgency.  Musculoskeletal: Negative for back pain, joint pain and myalgias.  Skin: Negative for rash.  Neurological: Negative for dizziness, tingling, focal weakness, seizures, weakness and headaches.  Endo/Heme/Allergies: Does not bruise/bleed easily.  Psychiatric/Behavioral: Negative for depression and suicidal ideas. The patient does not have insomnia.       Allergies  Allergen Reactions  . Penicillins      Past Medical History:  Diagnosis Date  . COPD (chronic obstructive pulmonary disease) ([place])   . Lung mass      Past Surgical History:  Procedure Laterality Date  . ENDOBRONCHIAL ULTRASOUND N/A 03/11/2018   Procedure: ENDOBRONCHIAL ULTRASOUND;  Surgeon: Tyler Pita, MD;  Location: [facility] ORS;  Service: Cardiopulmonary;  Laterality: N/A;  . none    . PORTA CATH INSERTION N/A 03/12/2018   Procedure: PORTA CATH  INSERTION, thigh;  Surgeon: Katha Cabal, MD;  Location: [place] [facility];  Service: Cardiovascular;  Laterality: N/A;    Social History   Socioeconomic History  . Marital status: Divorced    Spouse name: Not on file  . Number of children: Not on file  . Years of education: Not on file  . Highest education level: Not on file  Occupational History  . Occupation: Cabin crew  Social Needs  . Financial resource strain: Not on file  . Food insecurity:    Worry: Not on file    Inability: Not on file  . Transportation needs:    Medical: Not on file    Non-medical: Not on file  Tobacco Use  . Smoking status: Current Every Day Smoker  . Smokeless tobacco: Never Used  Substance and Sexual Activity  . Alcohol use: Not Currently  . Drug use: Not Currently  . Sexual activity: Yes  Lifestyle  . Physical activity:    Days per week: Not on file    Minutes per session: Not on file  . Stress: Not on file  Relationships  . Social connections:    Talks on phone: Not on file    Gets together: Not on file    Attends religious service: Not on file    Active member of club or organization: Not on file    Attends meetings of clubs or organizations: Not on file    Relationship status: Not on file  . Intimate partner violence:    Fear of current or ex partner: Not on file    Emotionally abused: Not on file    Physically abused: Not on file    Forced sexual activity:  Not on file  Other Topics Concern  . Not on file  Social History Narrative  . Not on file    History reviewed. No pertinent family history.   Current Facility-Administered Medications:  .  0.9 %  sodium chloride infusion, 250 mL, Intravenous, PRN, Schnier, Dolores Lory, MD .  0.9 %  sodium chloride infusion, , Intravenous, Continuous, Jacqulynn Cadet, MD, Stopped at 03/12/18 1715 .  acetaminophen (TYLENOL) tablet 650 mg, 650 mg, Oral, Q6H PRN, 650 mg at 03/12/18 2143 **OR** acetaminophen (TYLENOL) suppository  650 mg, 650 mg, Rectal, Q6H PRN, Schnier, Dolores Lory, MD .  heparin ADULT infusion 100 units/mL (25000 units/276mL sodium chloride 0.45%), 1,500 Units/hr, Intravenous, Continuous, Schnier, Dolores Lory, MD, Last Rate: 15 mL/hr at 03/13/18 1043, 1,500 Units/hr at 03/13/18 1043 .  ipratropium-albuterol (DUONEB) 0.5-2.5 (3) MG/3ML nebulizer solution 3 mL, 3 mL, Nebulization, Q4H PRN, Schnier, Dolores Lory, MD .  oxyCODONE-acetaminophen (PERCOCET/ROXICET) 5-325 MG per tablet 1-2 tablet, 1-2 tablet, Oral, Q4H PRN, Schnier, Dolores Lory, MD, 1 tablet at 03/13/18 0734 .  senna-docusate (Senokot-S) tablet 1 tablet, 1 tablet, Oral, QHS PRN, Schnier, Dolores Lory, MD .  sodium chloride flush (NS) 0.9 % injection 10-40 mL, 10-40 mL, Intracatheter, Q12H, Wieting, Richard, MD, 10 mL at 03/13/18 1026 .  sodium chloride flush (NS) 0.9 % injection 10-40 mL, 10-40 mL, Intracatheter, PRN, Wieting, Richard, MD .  sodium chloride flush (NS) 0.9 % injection 3 mL, 3 mL, Intravenous, Q12H, Schnier, Dolores Lory, MD, 3 mL at 03/13/18 1026 .  sodium chloride flush (NS) 0.9 % injection 3 mL, 3 mL, Intravenous, PRN, Schnier, Dolores Lory, MD .  warfarin (COUMADIN) tablet 5 mg, 5 mg, Oral, q1800, Loletha Grayer, MD .  Warfarin - Pharmacist Dosing Inpatient, , Does not apply, q1800, Loletha Grayer, MD  Physical exam:  Vitals:   03/12/18 1721 03/12/18 1751 03/12/18 2017 03/13/18 0511  BP: 130/81 (!) 148/75 118/76 127/84  Pulse: 85 100 79 76  Resp: 18 16 19 18   Temp: 98.3 F (36.8 C) (!) 97.5 F (36.4 C) 98 F (36.7 C) 97.7 F (36.5 C)  TempSrc: Oral Oral Oral Oral  SpO2: 100% 99% 96% 95%  Weight:      Height:       Physical Exam  Constitutional: He is oriented to person, place, and time. He appears well-developed and well-nourished.  HENT:  Head: Normocephalic and atraumatic.  Distended neck veins bilaterally  Eyes: Pupils are equal, round, and reactive to light. EOM are normal.  Neck: Normal range of motion.  Cardiovascular:  Normal rate, regular rhythm and normal heart sounds.  Pulmonary/Chest: Effort normal and breath sounds normal.  Abdominal: Soft. Bowel sounds are normal.  Neurological: He is alert and oriented to person, place, and time.  Skin: Skin is warm and dry.     CMP Latest Ref Rng & Units 03/10/2018  Glucose 70 - 99 mg/dL 95  BUN 6 - 20 mg/dL 10  Creatinine 0.61 - 1.24 mg/dL 0.95  Sodium 135 - 145 mmol/L 139  Potassium 3.5 - 5.1 mmol/L 3.9  Chloride 98 - 111 mmol/L 102  CO2 22 - 32 mmol/L 28  Calcium 8.9 - 10.3 mg/dL 9.0  Total Protein 6.5 - 8.1 g/dL -  Total Bilirubin 0.3 - 1.2 mg/dL -  Alkaline Phos 38 - 126 U/L -  AST 15 - 41 U/L -  ALT 0 - 44 U/L -   CBC Latest Ref Rng & Units 03/13/2018  WBC 4.0 - 10.5 K/uL  11.4(H)  Hemoglobin 13.0 - 17.0 g/dL 11.3(L)  Hematocrit 39.0 - 52.0 % 35.8(L)  Platelets 150 - 400 K/uL 222    @IMAGES @  Ct Chest W Contrast  Result Date: 03/09/2018 CLINICAL DATA:  Facial and neck swelling for several months. Abdominal pain. EXAM: CT CHEST, ABDOMEN, AND PELVIS WITH CONTRAST TECHNIQUE: Multidetector CT imaging of the chest, abdomen and pelvis was performed following the standard protocol during bolus administration of intravenous contrast. CONTRAST:  159mL ISOVUE-300 IOPAMIDOL (ISOVUE-300) INJECTION 61% COMPARISON:  None. FINDINGS: CT CHEST FINDINGS Cardiovascular: There is no evidence of thoracic aortic dissection or aneurysm. Normal cardiac size. No pericardial effusion is noted. 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 Small amount of contrast is seen flowing into the right atrium. This results in multiple collateral veins. Probable thrombotic occlusion of left brachiocephalic vein is noted as well. Mediastinum/Nodes: Previously described SVC thrombosis and severe narrowing appears to be due to ill-defined soft tissue mass in right paratracheal and precarinal area measuring 3.2 x 2.3 cm, as well as right hilar mass or node  measuring 2.2 x 1.7 cm. Thyroid gland is unremarkable. Esophagus is unremarkable. Lungs/Pleura: No pneumothorax or pleural effusion is noted. Emphysematous disease is noted in both upper lobes. 2.1 x 2.0 cm irregular mass is noted medially in the right upper lobe concerning for malignancy. Also noted is spiculated density measuring 12 x 9 mm with pleural tail in right upper lobe concerning for possible malignancy. Musculoskeletal: No chest wall mass or suspicious bone lesions identified. CT ABDOMEN PELVIS FINDINGS Hepatobiliary: No focal liver abnormality is seen. No gallstones, gallbladder wall thickening, or biliary dilatation. Pancreas: Unremarkable. No pancreatic ductal dilatation or surrounding inflammatory changes. Spleen: Normal in size without focal abnormality. Adrenals/Urinary Tract: Adrenal glands are unremarkable. Kidneys are normal, without renal calculi, focal lesion, or hydronephrosis. Bladder is unremarkable. Stomach/Bowel: The stomach appears normal. The appendix is not visualized. There is no evidence of bowel obstruction or inflammation. Vascular/Lymphatic: Atherosclerosis of abdominal aorta is noted without aneurysm or dissection. IVC and iliac veins appear to be patent. However, collateral veins are noted in the anterior subcutaneous tissues of the abdomen. No significant adenopathy is noted in the abdomen or pelvis. Reproductive: Prostate is unremarkable. Other: No abdominal wall hernia or abnormality. No abdominopelvic ascites. Musculoskeletal: No acute or significant osseous findings. IMPRESSION: 2.1 x 2.0 cm irregular mass is noted medially in right upper lobe concerning for malignancy. Also noted is probable right paratracheal, right hilar and precarinal adenopathy or malignancy, which results in severe narrowing and thrombosis of the superior vena cava. This results in multiple collateral veins in the chest and abdomen, as well as probable thrombosis of the left brachycephalic vein. This  is consistent with SVC syndrome. PET scan is recommended for further evaluation. Aortic Atherosclerosis (ICD10-I70.0) and Emphysema (ICD10-J43.9). Electronically Signed   By: Marijo Conception, M.D.   On: 03/09/2018 15:19   Mr Jeri Cos ZO Contrast  Result Date: 03/10/2018 CLINICAL DATA:  52 y/o  M; lung cancer for staging. EXAM: MRI HEAD WITHOUT AND WITH CONTRAST TECHNIQUE: Multiplanar, multiecho pulse sequences of the brain and surrounding structures were obtained without and with intravenous contrast. CONTRAST:  6 cc Gadavist COMPARISON:  03/09/2018 CT of chest, abdomen, and pelvis. FINDINGS: Brain: No acute infarction, hemorrhage, hydrocephalus, extra-axial collection or mass lesion. Few nonspecific T2 FLAIR hyperintensities in subcortical and periventricular white matter are compatible with mild chronic microvascular ischemic changes for age. No abnormal enhancement. Vascular: Normal flow voids.  Skull and upper cervical spine: Normal marrow signal. Sinuses/Orbits: Moderate mucosal thickening of paranasal sinuses. No abnormal signal of mastoid air cells. Orbits are unremarkable. Other: None. IMPRESSION: 1. No intracranial metastatic disease identified. 2. Mild chronic microvascular ischemic changes of the brain. 3. Moderate paranasal sinus mucosal thickening. Electronically Signed   By: Kristine Garbe M.D.   On: 03/10/2018 03:58   Ct Abdomen Pelvis W Contrast  Result Date: 03/09/2018 CLINICAL DATA:  Facial and neck swelling for several months. Abdominal pain. EXAM: CT CHEST, ABDOMEN, AND PELVIS WITH CONTRAST TECHNIQUE: Multidetector CT imaging of the chest, abdomen and pelvis was performed following the standard protocol during bolus administration of intravenous contrast. CONTRAST:  131mL ISOVUE-300 IOPAMIDOL (ISOVUE-300) INJECTION 61% COMPARISON:  None. FINDINGS: CT CHEST FINDINGS Cardiovascular: There is no evidence of thoracic aortic dissection or aneurysm. Normal cardiac size. No pericardial  effusion is noted. 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 Small amount of contrast is seen flowing into the right atrium. This results in multiple collateral veins. Probable thrombotic occlusion of left brachiocephalic vein is noted as well. Mediastinum/Nodes: Previously described SVC thrombosis and severe narrowing appears to be due to ill-defined soft tissue mass in right paratracheal and precarinal area measuring 3.2 x 2.3 cm, as well as right hilar mass or node measuring 2.2 x 1.7 cm. Thyroid gland is unremarkable. Esophagus is unremarkable. Lungs/Pleura: No pneumothorax or pleural effusion is noted. Emphysematous disease is noted in both upper lobes. 2.1 x 2.0 cm irregular mass is noted medially in the right upper lobe concerning for malignancy. Also noted is spiculated density measuring 12 x 9 mm with pleural tail in right upper lobe concerning for possible malignancy. Musculoskeletal: No chest wall mass or suspicious bone lesions identified. CT ABDOMEN PELVIS FINDINGS Hepatobiliary: No focal liver abnormality is seen. No gallstones, gallbladder wall thickening, or biliary dilatation. Pancreas: Unremarkable. No pancreatic ductal dilatation or surrounding inflammatory changes. Spleen: Normal in size without focal abnormality. Adrenals/Urinary Tract: Adrenal glands are unremarkable. Kidneys are normal, without renal calculi, focal lesion, or hydronephrosis. Bladder is unremarkable. Stomach/Bowel: The stomach appears normal. The appendix is not visualized. There is no evidence of bowel obstruction or inflammation. Vascular/Lymphatic: Atherosclerosis of abdominal aorta is noted without aneurysm or dissection. IVC and iliac veins appear to be patent. However, collateral veins are noted in the anterior subcutaneous tissues of the abdomen. No significant adenopathy is noted in the abdomen or pelvis. Reproductive: Prostate is unremarkable. Other: No abdominal wall hernia or  abnormality. No abdominopelvic ascites. Musculoskeletal: No acute or significant osseous findings. IMPRESSION: 2.1 x 2.0 cm irregular mass is noted medially in right upper lobe concerning for malignancy. Also noted is probable right paratracheal, right hilar and precarinal adenopathy or malignancy, which results in severe narrowing and thrombosis of the superior vena cava. This results in multiple collateral veins in the chest and abdomen, as well as probable thrombosis of the left brachycephalic vein. This is consistent with SVC syndrome. PET scan is recommended for further evaluation. Aortic Atherosclerosis (ICD10-I70.0) and Emphysema (ICD10-J43.9). Electronically Signed   By: Marijo Conception, M.D.   On: 03/09/2018 15:19   Dg Chest Port 1 View  Result Date: 03/12/2018 CLINICAL DATA:  Lung biopsy on 03/13/2018 EXAM: PORTABLE CHEST 1 VIEW COMPARISON:  03/09/2018 CT FINDINGS: No pneumothorax is identified. Emphysematous hyperinflation of the lungs is noted. The medial right upper lobe 2 cm nodular opacity as seen on prior chest CT is difficult to identify radiographically, likely from superimposition  of pulmonary vasculature and mediastinum. No aggressive osseous lesions. Subchondral cyst of the glenoid on the right. IMPRESSION: No pneumothorax status post lung biopsy.  COPD. Electronically Signed   By: Ashley Royalty M.D.   On: 03/12/2018 18:29     Assessment and plan- Patient is a 52 y.o. male  with newly diagnosed right upper lobe lung mass as well as mediastinal adenopathy causing SVC thrombosis and SVC syndrome.   1. SVC syndrome: he will be discharged today on lovenox and coumadin. I will f/u with him as an outpatient and check his INR to decide when he can come off lovenox. He should be discharged with lovenox atleast for 1 week. Teaching has been completed  2. 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 1st biopsy  was suspicious for malignancy but could not tell us if this is sclc versus nsclc or lymphoma. He had second IR guided biopsy yesterday. Awaiting final path. I will touch base with pathology on Monday. Also speak to Rad Onc to start his RT next week. Chemo will have to wait until biopsy results are finalized.  He understands that he needs to come to to the hospital if he has worsening sob, facial swelling, syncopal epiosdes. He needs to remain compliant with lovenox and coumadin. I will set up his appointment with me next week as well as PET scan   Visit Diagnosis 1. Malignant neoplasm of right lung, unspecified part of lung ([place])   2. 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 SOB (shortness of breath)   6. History of lung biopsy      Dr. Randa Evens, MD, MPH [facility] [phone] 03/13/2018 3:05 PM

## 2018-03-13 NOTE — Progress Notes (Signed)
[place] for heparin/warfarin Indication: SVC thrombosis  Allergies  Allergen Reactions  . Penicillins     Patient Measurements: Height: 5\' 9"  (175.3 cm) Weight: 136 lb 3.2 oz (61.8 kg) IBW/kg (Calculated) : 70.7 Heparin Dosing Weight: 62.6 kg  Vital Signs: Temp: 97.7 F (36.5 C) (10/19 0511) Temp Source: Oral (10/19 0511) BP: 127/84 (10/19 0511) Pulse Rate: 76 (10/19 0511)  Labs: Recent Labs    03/11/18 0342  03/12/18 0420 03/12/18 1014 03/13/18 0444 03/13/18 1256  HGB 11.9*  --  11.8*  --  11.3*  --   HCT 36.6*  --  36.1*  --  35.8*  --   PLT 226  --  234  --  222  --   LABPROT  --   --   --   --  13.5  --   INR  --   --   --   --  1.04  --   HEPARINUNFRC 0.53   < > 0.62 0.78* 0.24* 0.62   < > = values in this interval not displayed.    Estimated Creatinine Clearance: 78.6 mL/min (by C-G formula based on SCr of 0.95 mg/dL).   Medical History: Past Medical History:  Diagnosis Date  . COPD (chronic obstructive pulmonary disease) ([place])   . Lung mass     Assessment: 52 yo male to start on heparin drip for SVC thrombosis. No PTA meds on med rec  10/19 INR = 1.02    Goal of Therapy:  INR 2 - 3 Heparin level 0.3-0.7 units/ml Monitor platelets by anticoagulation protocol: Yes   Plan:  10/19 ~ 13:00 HL = 0.62. Continue current drip rate.  Recheck HL in 6 hours at 19:00.  Give warfarin 5mg  PO tonight. Check INR with am labs.   Pharmacy will continue to follow and adjust as per protocol.   Olivia Canter, [facility] 03/13/2018 1:43 PM

## 2018-03-13 NOTE — Progress Notes (Signed)
[place] for heparin/warfarin Indication: SVC thrombosis  Allergies  Allergen Reactions  . Penicillins     Patient Measurements: Height: 5\' 9"  (175.3 cm) Weight: 136 lb 3.2 oz (61.8 kg) IBW/kg (Calculated) : 70.7 Heparin Dosing Weight: 62.6 kg  Vital Signs: Temp: 97.7 F (36.5 C) (10/19 0511) Temp Source: Oral (10/19 0511) BP: 127/84 (10/19 0511) Pulse Rate: 76 (10/19 0511)  Labs: Recent Labs    03/10/18 0708  03/11/18 0342  03/12/18 0420 03/12/18 1014 03/13/18 0444  HGB 11.6*  --  11.9*  --  11.8*  --  11.3*  HCT 36.5*  --  36.6*  --  36.1*  --  35.8*  PLT 233  --  226  --  234  --  222  LABPROT  --   --   --   --   --   --  13.5  INR  --   --   --   --   --   --  1.04  HEPARINUNFRC 0.13*   < > 0.53   < > 0.62 0.78* 0.24*  CREATININE 0.95  --   --   --   --   --   --    < > = values in this interval not displayed.    Estimated Creatinine Clearance: 78.6 mL/min (by C-G formula based on SCr of 0.95 mg/dL).   Medical History: Past Medical History:  Diagnosis Date  . COPD (chronic obstructive pulmonary disease) ([place])   . Lung mass     Assessment: 52 yo male to start on heparin drip for SVC thrombosis. No PTA meds on med rec  Goal of Therapy:  INR 2 - 3 Heparin level 0.3-0.7 units/ml Monitor platelets by anticoagulation protocol: Yes   Plan:  10/18 10:14 Heparin level 0.78. Patient went for port placement and Heparin drip was stopped for procedure. Patient then went for biopsy. Will restart Heparin 1400 units/hr at 23:00. Will begin Warfarin 5mg  tonight. Daily INR checks.  10/19 AM heparin level 0.24. 900 unit bolus and increase rate to 1500 units/hr. Recheck in 6 hours.  Pharmacy will continue to follow.   Paulina Fusi, PharmD, BCPS 03/13/2018 6:52 AM

## 2018-03-15 ENCOUNTER — Other Ambulatory Visit: Payer: Self-pay | Admitting: *Deleted

## 2018-03-15 ENCOUNTER — Ambulatory Visit
Admission: RE | Admit: 2018-03-15 | Discharge: 2018-03-15 | Disposition: A | Discharge status: Home / Self Care | Payer: Self-pay | Source: Ambulatory Visit | Attending: Radiation Oncology | Admitting: Radiation Oncology

## 2018-03-15 ENCOUNTER — Inpatient Hospital Stay: Payer: BLUE CROSS/BLUE SHIELD | Attending: Oncology

## 2018-03-15 ENCOUNTER — Telehealth: Payer: Self-pay | Admitting: *Deleted

## 2018-03-15 ENCOUNTER — Other Ambulatory Visit: Payer: Self-pay | Admitting: Rheumatology

## 2018-03-15 DIAGNOSIS — C3411 Malignant neoplasm of upper lobe, right bronchus or lung: Secondary | ICD-10-CM | POA: Diagnosis not present

## 2018-03-15 DIAGNOSIS — Z5111 Encounter for antineoplastic chemotherapy: Secondary | ICD-10-CM | POA: Insufficient documentation

## 2018-03-15 DIAGNOSIS — R918 Other nonspecific abnormal finding of lung field: Secondary | ICD-10-CM

## 2018-03-15 DIAGNOSIS — Z7901 Long term (current) use of anticoagulants: Secondary | ICD-10-CM

## 2018-03-15 DIAGNOSIS — F17210 Nicotine dependence, cigarettes, uncomplicated: Secondary | ICD-10-CM | POA: Insufficient documentation

## 2018-03-15 DIAGNOSIS — Z5181 Encounter for therapeutic drug level monitoring: Secondary | ICD-10-CM

## 2018-03-15 DIAGNOSIS — I82210 Acute embolism and thrombosis of superior vena cava: Secondary | ICD-10-CM

## 2018-03-15 LAB — SURGICAL PATHOLOGY

## 2018-03-15 LAB — PROTIME-INR
INR: 1.09
PROTHROMBIN TIME: 14 s (ref 11.4–15.2)

## 2018-03-15 MED ORDER — APIXABAN 5 MG PO TABS: 5.0000 mg | ORAL_TABLET | Freq: Two times a day (BID) | ORAL | 0 refills | Status: DC

## 2018-03-15 MED ORDER — APIXABAN 5 MG PO TABS: 10.0000 mg | ORAL_TABLET | Freq: Two times a day (BID) | ORAL | 0 refills | Status: DC

## 2018-03-15 MED ORDER — ELIQUIS 5 MG VTE STARTER PACK: ORAL_TABLET | ORAL | 0 refills | Status: DC

## 2018-03-15 NOTE — Telephone Encounter (Signed)
Pt called and informed of PT/INR results. Per Dr. Janese Banks, pt can stop coumadin and lovenox at this time and start taking eliquis. Pt stated that eliquis had to be ordered and will be ready tomorrow at 3pm for pickup. If pt not able to get eliquis today then only needs to take lovenox tonight and tomorrow until gets eliquis tomorrow. Pt needs to start eliquis tomorrow evening.   Pt asked if eliquis could be sent to different pharmacy if had in stock. Called [facility] on [address] who had medication in stock. Pt informed the prescription was sent and that can start taking tonight if able to pick up. Pt informed that if unable to pick up tonight for any reason then only needs to take lovenox tonight and tomorrow. Pt verbalized understanding. Will follow up with patient tomorrow morning at next visit.

## 2018-03-15 NOTE — Patient Instructions (Signed)
Cisplatin injection What is this medicine? CISPLATIN (SIS pla tin) is a chemotherapy drug. It targets fast dividing cells, like cancer cells, and causes these cells to die. This medicine is used to treat many types of cancer like bladder, ovarian, and testicular cancers. This medicine may be used for other purposes; ask your health care provider or pharmacist if you have questions. COMMON BRAND NAME(S): Platinol, Platinol -AQ What should I tell my health care provider before I take this medicine? They need to know if you have any of these conditions: -blood disorders -hearing problems -kidney disease -recent or ongoing radiation therapy -an unusual or allergic reaction to cisplatin, carboplatin, other chemotherapy, other medicines, foods, dyes, or preservatives -pregnant or trying to get pregnant -breast-feeding How should I use this medicine? This drug is given as an infusion into a vein. It is administered in a hospital or clinic by a specially trained health care professional. Talk to your pediatrician regarding the use of this medicine in children. Special care may be needed. Overdosage: If you think you have taken too much of this medicine contact a poison control center or emergency room at once. NOTE: This medicine is only for you. Do not share this medicine with others. What if I miss a dose? It is important not to miss a dose. Call your doctor or health care professional if you are unable to keep an appointment. What may interact with this medicine? -dofetilide -foscarnet -medicines for seizures -medicines to increase blood counts like filgrastim, pegfilgrastim, sargramostim -probenecid -pyridoxine used with altretamine -rituximab -some antibiotics like amikacin, gentamicin, neomycin, polymyxin B, streptomycin, tobramycin -sulfinpyrazone -vaccines -zalcitabine Talk to your doctor or health care professional before taking any of these  medicines: -acetaminophen -aspirin -ibuprofen -ketoprofen -naproxen This list may not describe all possible interactions. Give your health care provider a list of all the medicines, herbs, non-prescription drugs, or dietary supplements you use. Also tell them if you smoke, drink alcohol, or use illegal drugs. Some items may interact with your medicine. What should I watch for while using this medicine? Your condition will be monitored carefully while you are receiving this medicine. You will need important blood work done while you are taking this medicine. This drug may make you feel generally unwell. This is not uncommon, as chemotherapy can affect healthy cells as well as cancer cells. Report any side effects. Continue your course of treatment even though you feel ill unless your doctor tells you to stop. In some cases, you may be given additional medicines to help with side effects. Follow all directions for their use. Call your doctor or health care professional for advice if you get a fever, chills or sore throat, or other symptoms of a cold or flu. Do not treat yourself. This drug decreases your body's ability to fight infections. Try to avoid being around people who are sick. This medicine may increase your risk to bruise or bleed. Call your doctor or health care professional if you notice any unusual bleeding. Be careful brushing and flossing your teeth or using a toothpick because you may get an infection or bleed more easily. If you have any dental work done, tell your dentist you are receiving this medicine. Avoid taking products that contain aspirin, acetaminophen, ibuprofen, naproxen, or ketoprofen unless instructed by your doctor. These medicines may hide a fever. Do not become pregnant while taking this medicine. Women should inform their doctor if they wish to become pregnant or think they might be pregnant. There is a   potential for serious side effects to an unborn child. Talk to  your health care professional or pharmacist for more information. Do not breast-feed an infant while taking this medicine. Drink fluids as directed while you are taking this medicine. This will help protect your kidneys. Call your doctor or health care professional if you get diarrhea. Do not treat yourself. What side effects may I notice from receiving this medicine? Side effects that you should report to your doctor or health care professional as soon as possible: -allergic reactions like skin rash, itching or hives, swelling of the face, lips, or tongue -signs of infection - fever or chills, cough, sore throat, pain or difficulty passing urine -signs of decreased platelets or bleeding - bruising, pinpoint red spots on the skin, black, tarry stools, nosebleeds -signs of decreased red blood cells - unusually weak or tired, fainting spells, lightheadedness -breathing problems -changes in hearing -gout pain -low blood counts - This drug may decrease the number of white blood cells, red blood cells and platelets. You may be at increased risk for infections and bleeding. -nausea and vomiting -pain, swelling, redness or irritation at the injection site -pain, tingling, numbness in the hands or feet -problems with balance, movement -trouble passing urine or change in the amount of urine Side effects that usually do not require medical attention (report to your doctor or health care professional if they continue or are bothersome): -changes in vision -loss of appetite -metallic taste in the mouth or changes in taste This list may not describe all possible side effects. Call your doctor for medical advice about side effects. You may report side effects to FDA at 1-800-FDA-1088. Where should I keep my medicine? This drug is given in a hospital or clinic and will not be stored at home. NOTE: This sheet is a summary. It may not cover all possible information. If you have questions about this medicine,  talk to your doctor, pharmacist, or health care provider.  2018 [employer]vier/Gold Standard (2007-08-17 14:40:54) Pemetrexed injection What is this medicine? PEMETREXED (PEM e TREX ed) is a chemotherapy drug used to treat lung cancers like non-small cell lung cancer and mesothelioma. It may also be used to treat other cancers. This medicine may be used for other purposes; ask your health care provider or pharmacist if you have questions. COMMON BRAND NAME(S): Alimta What should I tell my health care provider before I take this medicine? They need to know if you have any of these conditions: -infection (especially a virus infection such as chickenpox, cold sores, or herpes) -kidney disease -low blood counts, like low white cell, platelet, or red cell counts -lung or breathing disease, like asthma -radiation therapy -an unusual or allergic reaction to pemetrexed, other medicines, foods, dyes, or preservative -pregnant or trying to get pregnant -breast-feeding How should I use this medicine? This drug is given as an infusion into a vein. It is administered in a hospital or clinic by a specially trained health care professional. Talk to your pediatrician regarding the use of this medicine in children. Special care may be needed. Overdosage: If you think you have taken too much of this medicine contact a poison control center or emergency room at once. NOTE: This medicine is only for you. Do not share this medicine with others. What if I miss a dose? It is important not to miss your dose. Call your doctor or health care professional if you are unable to keep an appointment. What may interact with this medicine? This  medicine may interact with the following medications: -Ibuprofen This list may not describe all possible interactions. Give your health care provider a list of all the medicines, herbs, non-prescription drugs, or dietary supplements you use. Also tell them if you smoke, drink alcohol,  or use illegal drugs. Some items may interact with your medicine. What should I watch for while using this medicine? Visit your doctor for checks on your progress. This drug may make you feel generally unwell. This is not uncommon, as chemotherapy can affect healthy cells as well as cancer cells. Report any side effects. Continue your course of treatment even though you feel ill unless your doctor tells you to stop. In some cases, you may be given additional medicines to help with side effects. Follow all directions for their use. Call your doctor or health care professional for advice if you get a fever, chills or sore throat, or other symptoms of a cold or flu. Do not treat yourself. This drug decreases your body's ability to fight infections. Try to avoid being around people who are sick. This medicine may increase your risk to bruise or bleed. Call your doctor or health care professional if you notice any unusual bleeding. Be careful brushing and flossing your teeth or using a toothpick because you may get an infection or bleed more easily. If you have any dental work done, tell your dentist you are receiving this medicine. Avoid taking products that contain aspirin, acetaminophen, ibuprofen, naproxen, or ketoprofen unless instructed by your doctor. These medicines may hide a fever. Call your doctor or health care professional if you get diarrhea or mouth sores. Do not treat yourself. To protect your kidneys, drink water or other fluids as directed while you are taking this medicine. Do not become pregnant while taking this medicine or for 6 months after stopping it. Women should inform their doctor if they wish to become pregnant or think they might be pregnant. Men should not father a child while taking this medicine and for 3 months after stopping it. This may interfere with the ability to father a child. You should talk to your doctor or health care professional if you are concerned about your  fertility. There is a potential for serious side effects to an unborn child. Talk to your health care professional or pharmacist for more information. Do not breast-feed an infant while taking this medicine or for 1 week after stopping it. What side effects may I notice from receiving this medicine? Side effects that you should report to your doctor or health care professional as soon as possible: -allergic reactions like skin rash, itching or hives, swelling of the face, lips, or tongue -breathing problems -redness, blistering, peeling or loosening of the skin, including inside the mouth -signs and symptoms of bleeding such as bloody or black, tarry stools; red or dark-brown urine; spitting up blood or brown material that looks like coffee grounds; red spots on the skin; unusual bruising or bleeding from the eye, gums, or nose -signs and symptoms of infection like fever or chills; cough; sore throat; pain or trouble passing urine -signs and symptoms of kidney injury like trouble passing urine or change in the amount of urine -signs and symptoms of liver injury like dark yellow or brown urine; general ill feeling or flu-like symptoms; light-colored stools; loss of appetite; nausea; right upper belly pain; unusually weak or tired; yellowing of the eyes or skin Side effects that usually do not require medical attention (report to your doctor or  health care professional if they continue or are bothersome): -constipation -dizziness -mouth sores -nausea, vomiting -pain, tingling, numbness in the hands or feet -unusually weak or tired This list may not describe all possible side effects. Call your doctor for medical advice about side effects. You may report side effects to FDA at 1-800-FDA-1088. Where should I keep my medicine? This drug is given in a hospital or clinic and will not be stored at home. NOTE: This sheet is a summary. It may not cover all possible information. If you have questions about  this medicine, talk to your doctor, pharmacist, or health care provider.  2018 Elsevier/Gold Standard (2016-03-11 18:51:46)

## 2018-03-16 ENCOUNTER — Ambulatory Visit: Payer: Self-pay

## 2018-03-16 ENCOUNTER — Inpatient Hospital Stay: Payer: BLUE CROSS/BLUE SHIELD

## 2018-03-16 ENCOUNTER — Inpatient Hospital Stay (HOSPITAL_BASED_OUTPATIENT_CLINIC_OR_DEPARTMENT_OTHER): Payer: BLUE CROSS/BLUE SHIELD | Admitting: Oncology

## 2018-03-16 ENCOUNTER — Encounter: Payer: Self-pay | Admitting: Oncology

## 2018-03-16 ENCOUNTER — Ambulatory Visit
Admission: RE | Admit: 2018-03-16 | Discharge: 2018-03-16 | Disposition: A | Discharge status: Home / Self Care | Payer: Self-pay | Source: Ambulatory Visit | Attending: Radiation Oncology | Admitting: Radiation Oncology

## 2018-03-16 VITALS — BP 119/82 | HR 89 | Temp 98.9°F | Resp 18 | Ht 69.0 in | Wt 137.9 lb

## 2018-03-16 DIAGNOSIS — Z5111 Encounter for antineoplastic chemotherapy: Secondary | ICD-10-CM | POA: Diagnosis not present

## 2018-03-16 DIAGNOSIS — I871 Compression of vein: Secondary | ICD-10-CM

## 2018-03-16 DIAGNOSIS — C3411 Malignant neoplasm of upper lobe, right bronchus or lung: Secondary | ICD-10-CM

## 2018-03-16 DIAGNOSIS — C3490 Malignant neoplasm of unspecified part of unspecified bronchus or lung: Secondary | ICD-10-CM | POA: Insufficient documentation

## 2018-03-16 DIAGNOSIS — F17210 Nicotine dependence, cigarettes, uncomplicated: Secondary | ICD-10-CM

## 2018-03-16 DIAGNOSIS — Z7901 Long term (current) use of anticoagulants: Secondary | ICD-10-CM

## 2018-03-16 DIAGNOSIS — Z7189 Other specified counseling: Secondary | ICD-10-CM

## 2018-03-16 LAB — CBC WITH DIFFERENTIAL/PLATELET
ABS IMMATURE GRANULOCYTES: 0.02 10*3/uL (ref 0.00–0.07)
BASOS ABS: 0.1 10*3/uL (ref 0.0–0.1)
BASOS PCT: 1 %
EOS ABS: 0.1 10*3/uL (ref 0.0–0.5)
Eosinophils Relative: 1 %
HCT: 40.3 % (ref 39.0–52.0)
Hemoglobin: 12.8 g/dL — ABNORMAL LOW (ref 13.0–17.0)
IMMATURE GRANULOCYTES: 0 %
Lymphocytes Relative: 25 %
Lymphs Abs: 2 10*3/uL (ref 0.7–4.0)
MCH: 28.6 pg (ref 26.0–34.0)
MCHC: 31.8 g/dL (ref 30.0–36.0)
MCV: 90 fL (ref 80.0–100.0)
MONOS PCT: 8 %
Monocytes Absolute: 0.6 10*3/uL (ref 0.1–1.0)
NEUTROS PCT: 65 %
NRBC: 0 % (ref 0.0–0.2)
Neutro Abs: 5.2 10*3/uL (ref 1.7–7.7)
PLATELETS: 220 10*3/uL (ref 150–400)
RBC: 4.48 MIL/uL (ref 4.22–5.81)
RDW: 13.2 % (ref 11.5–15.5)
WBC: 7.9 10*3/uL (ref 4.0–10.5)

## 2018-03-16 LAB — COMPREHENSIVE METABOLIC PANEL
ALK PHOS: 91 U/L (ref 38–126)
ALT: 16 U/L (ref 0–44)
ANION GAP: 7 (ref 5–15)
AST: 18 U/L (ref 15–41)
Albumin: 4.3 g/dL (ref 3.5–5.0)
BILIRUBIN TOTAL: 0.6 mg/dL (ref 0.3–1.2)
BUN: 11 mg/dL (ref 6–20)
CALCIUM: 9.7 mg/dL (ref 8.9–10.3)
CO2: 30 mmol/L (ref 22–32)
Chloride: 104 mmol/L (ref 98–111)
Creatinine, Ser: 0.99 mg/dL (ref 0.61–1.24)
GFR calc non Af Amer: >60 mL/min (ref >60)
GLUCOSE: 106 mg/dL — AB (ref 70–99)
Potassium: 4.9 mmol/L (ref 3.5–5.1)
Sodium: 141 mmol/L (ref 135–145)
TOTAL PROTEIN: 7.7 g/dL (ref 6.5–8.1)

## 2018-03-16 MED ORDER — DEXAMETHASONE 4 MG PO TABS: ORAL_TABLET | ORAL | 1 refills | Status: DC

## 2018-03-16 MED ORDER — ONDANSETRON HCL 8 MG PO TABS: 8.0000 mg | ORAL_TABLET | Freq: Two times a day (BID) | ORAL | 1 refills | Status: DC | PRN

## 2018-03-16 MED ORDER — FOLIC ACID 1 MG PO TABS: 1.0000 mg | ORAL_TABLET | Freq: Every day | ORAL | 3 refills | Status: DC

## 2018-03-16 MED ORDER — LIDOCAINE-PRILOCAINE 2.5-2.5 % EX CREA: TOPICAL_CREAM | CUTANEOUS | 3 refills | Status: DC

## 2018-03-16 NOTE — Progress Notes (Signed)
START OFF PATHWAY REGIMEN - Non-Small Cell Lung   [id number]:Cisplatin 75 mg/m2 + Pemetrexed 500 mg/m2 q21 Days:   A cycle is every 21 days:     Pemetrexed      Cisplatin   **Always confirm dose/schedule in your pharmacy ordering system**  Patient Characteristics: Stage III - Unresectable, PS = 0, 1 AJCC T Category: T3 Current Disease Status: No Distant Mets or Local Recurrence AJCC N Category: N2 AJCC M Category: M0 AJCC 8 Stage Grouping: IIIB Performance Status: PS = 0, 1 Intent of Therapy: Curative Intent, Discussed with Patient

## 2018-03-17 ENCOUNTER — Ambulatory Visit
Admission: RE | Admit: 2018-03-17 | Discharge: 2018-03-17 | Disposition: A | Discharge status: Home / Self Care | Payer: Self-pay | Source: Ambulatory Visit | Attending: Radiation Oncology | Admitting: Radiation Oncology

## 2018-03-17 ENCOUNTER — Inpatient Hospital Stay: Payer: BLUE CROSS/BLUE SHIELD

## 2018-03-17 ENCOUNTER — Ambulatory Visit: Payer: Self-pay

## 2018-03-17 ENCOUNTER — Encounter: Payer: Self-pay | Admitting: *Deleted

## 2018-03-17 VITALS — BP 122/83 | HR 100 | Temp 96.5°F | Resp 18

## 2018-03-17 DIAGNOSIS — C3411 Malignant neoplasm of upper lobe, right bronchus or lung: Secondary | ICD-10-CM

## 2018-03-17 DIAGNOSIS — Z5111 Encounter for antineoplastic chemotherapy: Secondary | ICD-10-CM | POA: Diagnosis not present

## 2018-03-17 MED ORDER — SODIUM CHLORIDE 0.9 % IV SOLN
510.0000 mg/m2 | Freq: Once | INTRAVENOUS | Status: AC
  Administered 2018-03-17: 900 mg via INTRAVENOUS
  Filled 2018-03-17: qty 36

## 2018-03-17 MED ORDER — PALONOSETRON HCL INJECTION 0.25 MG/5ML
0.2500 mg | Freq: Once | INTRAVENOUS | Status: AC
  Administered 2018-03-17: 0.25 mg via INTRAVENOUS
  Filled 2018-03-17: qty 5

## 2018-03-17 MED ORDER — POTASSIUM CHLORIDE 2 MEQ/ML IV SOLN
Freq: Once | INTRAVENOUS | Status: AC
  Administered 2018-03-17: 10:00:00 via INTRAVENOUS
  Filled 2018-03-17: qty 10

## 2018-03-17 MED ORDER — SODIUM CHLORIDE 0.9 % IV SOLN
Freq: Once | INTRAVENOUS | Status: AC
  Administered 2018-03-17: 12:00:00 via INTRAVENOUS
  Filled 2018-03-17: qty 5

## 2018-03-17 MED ORDER — SODIUM CHLORIDE 0.9 % IV SOLN
Freq: Once | INTRAVENOUS | Status: AC
  Administered 2018-03-17: 10:00:00 via INTRAVENOUS
  Filled 2018-03-17: qty 250

## 2018-03-17 MED ORDER — CYANOCOBALAMIN 1000 MCG/ML IJ SOLN
1000.0000 ug | Freq: Once | INTRAMUSCULAR | Status: AC
  Administered 2018-03-17: 1000 ug via INTRAMUSCULAR
  Filled 2018-03-17: qty 1

## 2018-03-17 MED ORDER — SODIUM CHLORIDE 0.9 % IV SOLN
75.0000 mg/m2 | Freq: Once | INTRAVENOUS | Status: AC
  Administered 2018-03-17: 131 mg via INTRAVENOUS
  Filled 2018-03-17: qty 131

## 2018-03-17 MED ORDER — HEPARIN SOD (PORK) LOCK FLUSH 100 UNIT/ML IV SOLN
500.0000 [IU] | Freq: Once | INTRAVENOUS | Status: AC | PRN
  Administered 2018-03-17: 500 [IU]
  Filled 2018-03-17: qty 5

## 2018-03-17 NOTE — Progress Notes (Signed)
Pt tolerated infusion well. Pt stable at discharge. 

## 2018-03-17 NOTE — Progress Notes (Signed)
  Oncology Nurse Navigator Documentation  Navigator Location: [facility] (03/17/18 1100)   )Navigator Encounter Type: Treatment (03/17/18 1100)     Confirmed Diagnosis Date: 03/15/18 (03/17/18 1100)             Treatment Initiated Date: 03/15/18 (03/17/18 1100) Patient Visit Type: MedOnc;RadOnc (03/17/18 1100) Treatment Phase: First Chemo Tx (03/17/18 1100) Barriers/Navigation Needs: Education;Coordination of Care;Financial (03/17/18 1100) Education: Understanding Cancer/ Treatment Options;Newly Diagnosed Cancer Education (03/17/18 1100) Interventions: Coordination of Care;Education;Referrals;Medication Assistance (03/17/18 1100) Referrals: Social Work (03/17/18 1100) Coordination of Care: Appts;Radiology;Chemo (03/17/18 1100) Education Method: Verbal;Written (03/17/18 1100)      Acuity: Level 2 (03/17/18 1100)   Acuity Level 2: Initial guidance, education and coordination as needed;Educational needs;Assistance expediting appointments (03/17/18 1100)  met with patient prior to first chemo treatment today. All questions answered at the time of visit. Reviewed upcoming appts with patient. Pt referred to social work to discuss financial assistance during treatment as well as help pt apply for disability/medicaid. Pt given resources regarding diagnosis and supportive services available. Contact info given and instructed to call with any further questions or needs. Pt verbalized understanding.   Time Spent with Patient: > 120 (03/17/18 1100)

## 2018-03-18 ENCOUNTER — Inpatient Hospital Stay: Payer: BLUE CROSS/BLUE SHIELD | Admitting: Oncology

## 2018-03-18 ENCOUNTER — Ambulatory Visit: Payer: Self-pay

## 2018-03-18 ENCOUNTER — Ambulatory Visit
Admission: RE | Admit: 2018-03-18 | Discharge: 2018-03-18 | Disposition: A | Discharge status: Home / Self Care | Payer: Self-pay | Source: Ambulatory Visit | Attending: Radiation Oncology | Admitting: Radiation Oncology

## 2018-03-18 ENCOUNTER — Telehealth: Payer: Self-pay | Admitting: *Deleted

## 2018-03-18 ENCOUNTER — Telehealth: Payer: Self-pay | Admitting: Pharmacy Technician

## 2018-03-18 ENCOUNTER — Encounter: Payer: Self-pay | Admitting: Pharmacy Technician

## 2018-03-18 ENCOUNTER — Encounter
Admission: RE | Admit: 2018-03-18 | Discharge: 2018-03-18 | Disposition: A | Discharge status: Home / Self Care | Payer: BLUE CROSS/BLUE SHIELD | Source: Ambulatory Visit | Attending: Oncology | Admitting: Oncology

## 2018-03-18 DIAGNOSIS — R918 Other nonspecific abnormal finding of lung field: Secondary | ICD-10-CM | POA: Insufficient documentation

## 2018-03-18 LAB — GLUCOSE, CAPILLARY: Glucose-Capillary: 117 mg/dL — ABNORMAL HIGH (ref 70–99)

## 2018-03-18 MED ORDER — CHLORPROMAZINE HCL 25 MG PO TABS: 25.0000 mg | ORAL_TABLET | Freq: Four times a day (QID) | ORAL | 1 refills | Status: DC | PRN

## 2018-03-18 MED ORDER — FLUDEOXYGLUCOSE F - 18 (FDG) INJECTION
7.1000 | Freq: Once | INTRAVENOUS | Status: AC | PRN
  Administered 2018-03-18: 7.3 via INTRAVENOUS

## 2018-03-18 NOTE — Telephone Encounter (Signed)
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 Pt requests if a prescription could be sent into the pharmacy to help. Per Dr. Janese Banks, can send in prescription for thorazine 25mg  q6hours. Pt has been made aware that medication may make him drowsy. Pt verbalized understanding.

## 2018-03-18 NOTE — Progress Notes (Unsigned)
Patient has been approved for drug assistance by [employer] for Alimta. The enrollment period is from 03/17/18-03/17/19 based on selfpay. First DOS covered is 03/17/18.

## 2018-03-18 NOTE — Progress Notes (Signed)
Hematology/Oncology Consult note [facility]  [phone] [phone]  Patient Care Team: Patient, No Pcp Per as PCP - General ([place]) Telford Nab, RN as Registered Nurse   Name of the patient: Alexander [name]  [id number]  04-21-1966   Date of visit: 03/18/18  Diagnosis- New diagnosis of lung adenocarcinoma Stage IIIB cT3 cN2 cM0  Chief complaint/ Reason for visit- discuss results of biopsy and further management  Heme/Onc history: patient is a 52 year old male with a long-standing history of smoking.  He has smoked 1 to 1/2 pack of cigarettes per day for over 30 years.  He presented to outside urgent care with some symptoms of shortness of breath and was treated for URI.  He subsequently presented with facial and neck swelling which was thought by outside ER to be secondary to a drug reaction.  Following that patient came to ER here at [facility].  He underwent CT chest abdomen and pelvis which showed 2.1 x 2 cm irregular mass in the right upper lobe concerning for malignancy..  Multiple collateral veins in chest and abdomen probable thrombosis of the left brachiocephalic vein.  This is consistent with IVC syndrome  Probable right paratracheal right hilar and precarinal adenopathy resulting in severe narrowing and thrombosis of SVC.  This is consistent with SVC syndrome  Patient was seen by vascular surgery and there was no acute need for any endovascular therapies.  He is on anticoagulation with Coumadin which has been subsequently changed to Eliquis as an outpatient.  Patient initially underwent a bronchoscopy guided biopsy of the mediastinal lymph nodes which was inconclusive and underwent a second IR guided biopsy of the right upper lobe lung mass which was consistent with adenocarcinoma  Patient started radiation treatment on 03/15/2018   Interval history-patient is currently taking his Eliquis and has not missed any doses.   Reports that his neck and facial swelling is going down.  He is overall doing well and denies any shortness of breath pain or other complaints.  ECOG PS- 0 Pain scale- 0 Opioid associated constipation- no  Review of systems- Review of Systems  Constitutional: Negative for chills, fever, malaise/fatigue and weight loss.  HENT: Negative for congestion, ear discharge and nosebleeds.   Eyes: Negative for blurred vision.  Respiratory: Negative for cough, hemoptysis, sputum production, shortness of breath and wheezing.   Cardiovascular: Negative for chest pain, palpitations, orthopnea and claudication.  Gastrointestinal: Negative for abdominal pain, blood in stool, constipation, diarrhea, heartburn, melena, nausea and vomiting.  Genitourinary: Negative for dysuria, flank pain, frequency, hematuria and urgency.  Musculoskeletal: Negative for back pain, joint pain and myalgias.  Skin: Negative for rash.  Neurological: Negative for dizziness, tingling, focal weakness, seizures, weakness and headaches.  Endo/Heme/Allergies: Does not bruise/bleed easily.  Psychiatric/Behavioral: Negative for depression and suicidal ideas. The patient does not have insomnia.        Allergies  Allergen Reactions  . Doxycycline Swelling  . Penicillins      Past Medical History:  Diagnosis Date  . COPD (chronic obstructive pulmonary disease) ([place])   . Lung mass      Past Surgical History:  Procedure Laterality Date  . ENDOBRONCHIAL ULTRASOUND N/A 03/11/2018   Procedure: ENDOBRONCHIAL ULTRASOUND;  Surgeon: Tyler Pita, MD;  Location: [facility] ORS;  Service: Cardiopulmonary;  Laterality: N/A;  . none    . PORTA CATH INSERTION N/A 03/12/2018   Procedure: PORTA CATH INSERTION, thigh;  Surgeon: Katha Cabal, MD;  Location: [facility];  Service: Cardiovascular;  Laterality: N/A;    Social History   Socioeconomic History  . Marital status: Divorced    Spouse name: Not on file  . Number  of children: Not on file  . Years of education: Not on file  . Highest education level: Not on file  Occupational History  . Occupation: Cabin crew  Social Needs  . Financial resource strain: Not on file  . Food insecurity:    Worry: Not on file    Inability: Not on file  . Transportation needs:    Medical: Not on file    Non-medical: Not on file  Tobacco Use  . Smoking status: Current Every Day Smoker  . Smokeless tobacco: Never Used  Substance and Sexual Activity  . Alcohol use: Not Currently  . Drug use: Not Currently  . Sexual activity: Yes  Lifestyle  . Physical activity:    Days per week: Not on file    Minutes per session: Not on file  . Stress: Not on file  Relationships  . Social connections:    Talks on phone: Not on file    Gets together: Not on file    Attends religious service: Not on file    Active member of club or organization: Not on file    Attends meetings of clubs or organizations: Not on file    Relationship status: Not on file  . Intimate partner violence:    Fear of current or ex partner: Not on file    Emotionally abused: Not on file    Physically abused: Not on file    Forced sexual activity: Not on file  Other Topics Concern  . Not on file  Social History Narrative  . Not on file    History reviewed. No pertinent family history.   Current Outpatient Medications:  .  ELIQUIS STARTER PACK (ELIQUIS STARTER PACK) 5 MG TABS, Take as directed on package: start with two-5mg  tablets twice daily for 7 days. On day 8, switch to one-5mg  tablet twice daily., Disp: 1 each, Rfl: 0 .  oxyCODONE-acetaminophen (PERCOCET) 10-325 MG tablet, Take by mouth., Disp: , Rfl:  .  dexamethasone (DECADRON) 4 MG tablet, Take 1 tab two times a day the day before Alimta chemo. Take 2 tabs the day after chemo, then take 2 tabs two times a day for 2 days., Disp: 30 tablet, Rfl: 1 .  EPINEPHrine 0.3 mg/0.3 mL IJ SOAJ injection, Inject into the muscle., Disp: , Rfl:  .   folic acid (FOLVITE) 1 MG tablet, Take 1 tablet (1 mg total) by mouth daily. Start 5-7 days before Alimta chemotherapy. Continue until 21 days after Alimta completed., Disp: 100 tablet, Rfl: 3 .  lidocaine-prilocaine (EMLA) cream, Apply to affected area once, Disp: 30 g, Rfl: 3 .  ondansetron (ZOFRAN) 8 MG tablet, Take 1 tablet (8 mg total) by mouth 2 (two) times daily as needed (Nausea or vomiting). Start if needed on the third day after chemotherapy., Disp: 30 tablet, Rfl: 1 .  oxyCODONE-acetaminophen (PERCOCET/ROXICET) 5-325 MG tablet, Take 1-2 tablets by mouth every 4 (four) hours as needed for moderate pain., Disp: 30 tablet, Rfl: 0  Physical exam:  Vitals:   03/16/18 1126  BP: 119/82  Pulse: 89  Resp: 18  Temp: 98.9 F (37.2 C)  TempSrc: Tympanic  SpO2: 98%  Weight: 137 lb 14.4 oz (62.6 kg)  Height: 5\' 9"  (1.753 m)   Physical Exam  Constitutional: He is oriented to person,  place, and time. He appears well-developed and well-nourished.  HENT:  Head: Normocephalic and atraumatic.  B/l neck veins are distended  Eyes: Pupils are equal, round, and reactive to light. EOM are normal.  Neck: Normal range of motion.  Cardiovascular: Normal rate, regular rhythm and normal heart sounds.  Pulmonary/Chest: Effort normal and breath sounds normal.  Abdominal: Soft. Bowel sounds are normal.  Neurological: He is alert and oriented to person, place, and time.  Skin: Skin is warm and dry.     CMP Latest Ref Rng & Units 03/16/2018  Glucose 70 - 99 mg/dL 106(H)  BUN 6 - 20 mg/dL 11  Creatinine 0.61 - 1.24 mg/dL 0.99  Sodium 135 - 145 mmol/L 141  Potassium 3.5 - 5.1 mmol/L 4.9  Chloride 98 - 111 mmol/L 104  CO2 22 - 32 mmol/L 30  Calcium 8.9 - 10.3 mg/dL 9.7  Total Protein 6.5 - 8.1 g/dL 7.7  Total Bilirubin 0.3 - 1.2 mg/dL 0.6  Alkaline Phos 38 - 126 U/L 91  AST 15 - 41 U/L 18  ALT 0 - 44 U/L 16   CBC Latest Ref Rng & Units 03/16/2018  WBC 4.0 - 10.5 K/uL 7.9  Hemoglobin 13.0 - 17.0  g/dL 12.8(L)  Hematocrit 39.0 - 52.0 % 40.3  Platelets 150 - 400 K/uL 220      Ct Chest W Contrast  Result Date: 03/09/2018 CLINICAL DATA:  Facial and neck swelling for several months. Abdominal pain. EXAM: CT CHEST, ABDOMEN, AND PELVIS WITH CONTRAST TECHNIQUE: Multidetector CT imaging of the chest, abdomen and pelvis was performed following the standard protocol during bolus administration of intravenous contrast. CONTRAST:  122mL ISOVUE-300 IOPAMIDOL (ISOVUE-300) INJECTION 61% COMPARISON:  None. FINDINGS: CT CHEST FINDINGS Cardiovascular: There is no evidence of thoracic aortic dissection or aneurysm. Normal cardiac size. No pericardial effusion is noted. 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 Small amount of contrast is seen flowing into the right atrium. This results in multiple collateral veins. Probable thrombotic occlusion of left brachiocephalic vein is noted as well. Mediastinum/Nodes: Previously described SVC thrombosis and severe narrowing appears to be due to ill-defined soft tissue mass in right paratracheal and precarinal area measuring 3.2 x 2.3 cm, as well as right hilar mass or node measuring 2.2 x 1.7 cm. Thyroid gland is unremarkable. Esophagus is unremarkable. Lungs/Pleura: No pneumothorax or pleural effusion is noted. Emphysematous disease is noted in both upper lobes. 2.1 x 2.0 cm irregular mass is noted medially in the right upper lobe concerning for malignancy. Also noted is spiculated density measuring 12 x 9 mm with pleural tail in right upper lobe concerning for possible malignancy. Musculoskeletal: No chest wall mass or suspicious bone lesions identified. CT ABDOMEN PELVIS FINDINGS Hepatobiliary: No focal liver abnormality is seen. No gallstones, gallbladder wall thickening, or biliary dilatation. Pancreas: Unremarkable. No pancreatic ductal dilatation or surrounding inflammatory changes. Spleen: Normal in size without focal abnormality.  Adrenals/Urinary Tract: Adrenal glands are unremarkable. Kidneys are normal, without renal calculi, focal lesion, or hydronephrosis. Bladder is unremarkable. Stomach/Bowel: The stomach appears normal. The appendix is not visualized. There is no evidence of bowel obstruction or inflammation. Vascular/Lymphatic: Atherosclerosis of abdominal aorta is noted without aneurysm or dissection. IVC and iliac veins appear to be patent. However, collateral veins are noted in the anterior subcutaneous tissues of the abdomen. No significant adenopathy is noted in the abdomen or pelvis. Reproductive: Prostate is unremarkable. Other: No abdominal wall hernia or abnormality. No abdominopelvic ascites. Musculoskeletal:  No acute or significant osseous findings. IMPRESSION: 2.1 x 2.0 cm irregular mass is noted medially in right upper lobe concerning for malignancy. Also noted is probable right paratracheal, right hilar and precarinal adenopathy or malignancy, which results in severe narrowing and thrombosis of the superior vena cava. This results in multiple collateral veins in the chest and abdomen, as well as probable thrombosis of the left brachycephalic vein. This is consistent with SVC syndrome. PET scan is recommended for further evaluation. Aortic Atherosclerosis (ICD10-I70.0) and Emphysema (ICD10-J43.9). Electronically Signed   By: Marijo Conception, M.D.   On: 03/09/2018 15:19   Mr Jeri Cos IW Contrast  Result Date: 03/10/2018 CLINICAL DATA:  52 y/o  M; lung cancer for staging. EXAM: MRI HEAD WITHOUT AND WITH CONTRAST TECHNIQUE: Multiplanar, multiecho pulse sequences of the brain and surrounding structures were obtained without and with intravenous contrast. CONTRAST:  6 cc Gadavist COMPARISON:  03/09/2018 CT of chest, abdomen, and pelvis. FINDINGS: Brain: No acute infarction, hemorrhage, hydrocephalus, extra-axial collection or mass lesion. Few nonspecific T2 FLAIR hyperintensities in subcortical and periventricular white  matter are compatible with mild chronic microvascular ischemic changes for age. No abnormal enhancement. Vascular: Normal flow voids. Skull and upper cervical spine: Normal marrow signal. Sinuses/Orbits: Moderate mucosal thickening of paranasal sinuses. No abnormal signal of mastoid air cells. Orbits are unremarkable. Other: None. IMPRESSION: 1. No intracranial metastatic disease identified. 2. Mild chronic microvascular ischemic changes of the brain. 3. Moderate paranasal sinus mucosal thickening. Electronically Signed   By: Kristine Garbe M.D.   On: 03/10/2018 03:58   Ct Abdomen Pelvis W Contrast  Result Date: 03/09/2018 CLINICAL DATA:  Facial and neck swelling for several months. Abdominal pain. EXAM: CT CHEST, ABDOMEN, AND PELVIS WITH CONTRAST TECHNIQUE: Multidetector CT imaging of the chest, abdomen and pelvis was performed following the standard protocol during bolus administration of intravenous contrast. CONTRAST:  118mL ISOVUE-300 IOPAMIDOL (ISOVUE-300) INJECTION 61% COMPARISON:  None. FINDINGS: CT CHEST FINDINGS Cardiovascular: There is no evidence of thoracic aortic dissection or aneurysm. Normal cardiac size. No pericardial effusion is noted. 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 Small amount of contrast is seen flowing into the right atrium. This results in multiple collateral veins. Probable thrombotic occlusion of left brachiocephalic vein is noted as well. Mediastinum/Nodes: Previously described SVC thrombosis and severe narrowing appears to be due to ill-defined soft tissue mass in right paratracheal and precarinal area measuring 3.2 x 2.3 cm, as well as right hilar mass or node measuring 2.2 x 1.7 cm. Thyroid gland is unremarkable. Esophagus is unremarkable. Lungs/Pleura: No pneumothorax or pleural effusion is noted. Emphysematous disease is noted in both upper lobes. 2.1 x 2.0 cm irregular mass is noted medially in the right upper lobe concerning  for malignancy. Also noted is spiculated density measuring 12 x 9 mm with pleural tail in right upper lobe concerning for possible malignancy. Musculoskeletal: No chest wall mass or suspicious bone lesions identified. CT ABDOMEN PELVIS FINDINGS Hepatobiliary: No focal liver abnormality is seen. No gallstones, gallbladder wall thickening, or biliary dilatation. Pancreas: Unremarkable. No pancreatic ductal dilatation or surrounding inflammatory changes. Spleen: Normal in size without focal abnormality. Adrenals/Urinary Tract: Adrenal glands are unremarkable. Kidneys are normal, without renal calculi, focal lesion, or hydronephrosis. Bladder is unremarkable. Stomach/Bowel: The stomach appears normal. The appendix is not visualized. There is no evidence of bowel obstruction or inflammation. Vascular/Lymphatic: Atherosclerosis of abdominal aorta is noted without aneurysm or dissection. IVC and iliac veins appear to be  patent. However, collateral veins are noted in the anterior subcutaneous tissues of the abdomen. No significant adenopathy is noted in the abdomen or pelvis. Reproductive: Prostate is unremarkable. Other: No abdominal wall hernia or abnormality. No abdominopelvic ascites. Musculoskeletal: No acute or significant osseous findings. IMPRESSION: 2.1 x 2.0 cm irregular mass is noted medially in right upper lobe concerning for malignancy. Also noted is probable right paratracheal, right hilar and precarinal adenopathy or malignancy, which results in severe narrowing and thrombosis of the superior vena cava. This results in multiple collateral veins in the chest and abdomen, as well as probable thrombosis of the left brachycephalic vein. This is consistent with SVC syndrome. PET scan is recommended for further evaluation. Aortic Atherosclerosis (ICD10-I70.0) and Emphysema (ICD10-J43.9). Electronically Signed   By: Marijo Conception, M.D.   On: 03/09/2018 15:19   Ct Biopsy  Result Date: 03/15/2018 INDICATION:  52 year old male smoker with multifocal pulmonary nodules and probable metastatic mediastinal adenopathy resulting in SVC syndrome. He recently underwent a bronchoscopic biopsy which was nondiagnostic. He presents today for CT-guided biopsy to facilitate tissue diagnosis and allow for rapid treatment. EXAM: CT-guided biopsy right upper lobe pulmonary nodule Interventional Radiologist:  Criselda Peaches, MD MEDICATIONS: None. ANESTHESIA/SEDATION: Fentanyl 50 mcg IV; Versed 1.5 mg IV Moderate Sedation Time:  18 minutes The patient was continuously monitored during the procedure by the interventional radiology nurse under my direct supervision. FLUOROSCOPY TIME:  Fluoroscopy Time: 0 minutes 0 seconds (0 mGy). COMPLICATIONS: None immediate. Estimated blood loss:  0 PROCEDURE: Informed written consent was obtained from the patient after a thorough discussion of the procedural risks, benefits and alternatives. All questions were addressed. Maximal Sterile Barrier Technique was utilized including caps, mask, sterile gowns, sterile gloves, sterile drape, hand hygiene and skin antiseptic. A timeout was performed prior to the initiation of the procedure. A planning axial CT scan was performed. The nodule in the right upper lobe was successfully identified. A suitable skin entry site was selected and marked. The region was then sterilely prepped and draped in standard fashion using Betadine skin prep. Local anesthesia was attained by infiltration with 1% lidocaine. A small dermatotomy was made. Under intermittent CT fluoroscopic guidance, a 17 gauge trocar needle was advanced into the lung and positioned at the margin of the nodule. Multiple 18 gauge core biopsies were then coaxially obtained using the BioPince automated biopsy device. Biopsy specimens were placed in saline and delivered to pathology for further analysis. The biopsy device and introducer needle were removed. Post biopsy axial CT imaging demonstrates no  evidence of immediate complication. There is no pneumothorax. The patient tolerated the procedure well. IMPRESSION: Technically successful CT-guided biopsy medial right upper lobe pulmonary nodule. Electronically Signed   By: Jacqulynn Cadet M.D.   On: 03/15/2018 08:34   Dg Chest Port 1 View  Result Date: 03/12/2018 CLINICAL DATA:  Lung biopsy on 03/13/2018 EXAM: PORTABLE CHEST 1 VIEW COMPARISON:  03/09/2018 CT FINDINGS: No pneumothorax is identified. Emphysematous hyperinflation of the lungs is noted. The medial right upper lobe 2 cm nodular opacity as seen on prior chest CT is difficult to identify radiographically, likely from superimposition of pulmonary vasculature and mediastinum. No aggressive osseous lesions. Subchondral cyst of the glenoid on the right. IMPRESSION: No pneumothorax status post lung biopsy.  COPD. Electronically Signed   By: Ashley Royalty M.D.   On: 03/12/2018 18:29     Assessment and plan- Patient is a 52 y.o. male with newly diagnosed adenocarcinoma of the right lung stage  IIIb CT 3 N2 M0 presenting as SVC syndrome  1.  I discussed the results of the pathology and CT scan with the patient in detail.  Patient has a 2.1 cm right upper lobe mass and then there is another spiculated density of 12 mm separately in the right upper lobe making this a T3 disease.  He also has evidence of bulky mediastinal adenopathy which is causing the SVC syndrome.  He therefore has stage III disease.  Patient is already started radiation yesterday given his neck vein distention.  He will be getting head CT scan in 2 days time.  However I would like to get started with chemotherapy at this time given the urgency of the situation with the SVC syndrome.  2.  Patient is young as her performance status is 0 and does not have any significant comorbidities therefore recommend 4 cycles of cisplatin at 75 mg/m along with pemetrexed 500 mg/m given IV every 3 weeks for 4 cycles.  I will plan to get interim  scans after 2 cycles of treatment to assess response.  Upon completion of 4 cycles of chemotherapy I will obtain another set of scans and if he has evidence of good response to treatment I will proceed with maintenance durvalumab.  Discussed risks and benefits of chemotherapy including all but not limited to nausea, vomiting, fatigue, risk of infections and hospitalization.  Risk of kidney dysfunction, peripheral neuropathy hair loss and hearing loss associated with cisplatin.  Patient understands and agrees to proceed as planned.  Emphasized the importance of maintaining oral hydration during chemotherapy.  Chemotherapy will be given with a curative intent.  3.  Patient will proceed for his first cycle of chemotherapy tomorrow and I will see him back in 10 days time with a CBC and CMP to see how he tolerated his chemotherapy.  Patient does not have any financial resources to pay for his medications and I will therefore go ahead and give him Decadron and Zofran only and Ativan and Compazine later if need be.   Total face to face encounter time for this patient visit was 40 min. >50% of the time was  spent in counseling and coordination of care.     Visit Diagnosis 1. Malignant neoplasm of upper lobe of right lung [facility])      Dr. Randa Evens, MD, MPH [facility] at [facility] [phone] 03/18/2018 8:46 AM

## 2018-03-19 ENCOUNTER — Ambulatory Visit
Admission: RE | Admit: 2018-03-19 | Discharge: 2018-03-19 | Disposition: A | Discharge status: Home / Self Care | Payer: Self-pay | Source: Ambulatory Visit | Attending: Radiation Oncology | Admitting: Radiation Oncology

## 2018-03-19 ENCOUNTER — Ambulatory Visit: Payer: Self-pay

## 2018-03-22 ENCOUNTER — Ambulatory Visit: Payer: Self-pay

## 2018-03-22 ENCOUNTER — Ambulatory Visit
Admission: RE | Admit: 2018-03-22 | Discharge: 2018-03-22 | Disposition: A | Discharge status: Home / Self Care | Payer: Self-pay | Source: Ambulatory Visit | Attending: Radiation Oncology | Admitting: Radiation Oncology

## 2018-03-23 ENCOUNTER — Ambulatory Visit
Admission: RE | Admit: 2018-03-23 | Discharge: 2018-03-23 | Disposition: A | Discharge status: Home / Self Care | Payer: Self-pay | Source: Ambulatory Visit | Attending: Radiation Oncology | Admitting: Radiation Oncology

## 2018-03-23 ENCOUNTER — Ambulatory Visit: Payer: Self-pay

## 2018-03-23 ENCOUNTER — Other Ambulatory Visit: Payer: Self-pay

## 2018-03-23 DIAGNOSIS — C3411 Malignant neoplasm of upper lobe, right bronchus or lung: Secondary | ICD-10-CM

## 2018-03-24 ENCOUNTER — Ambulatory Visit: Payer: Self-pay

## 2018-03-24 ENCOUNTER — Ambulatory Visit
Admission: RE | Admit: 2018-03-24 | Discharge: 2018-03-24 | Disposition: A | Discharge status: Home / Self Care | Payer: Self-pay | Source: Ambulatory Visit | Attending: Radiation Oncology | Admitting: Radiation Oncology

## 2018-03-25 ENCOUNTER — Encounter: Payer: Self-pay | Admitting: Oncology

## 2018-03-25 ENCOUNTER — Ambulatory Visit: Payer: Self-pay

## 2018-03-25 ENCOUNTER — Inpatient Hospital Stay (HOSPITAL_BASED_OUTPATIENT_CLINIC_OR_DEPARTMENT_OTHER): Payer: BLUE CROSS/BLUE SHIELD | Admitting: Oncology

## 2018-03-25 ENCOUNTER — Inpatient Hospital Stay: Payer: BLUE CROSS/BLUE SHIELD

## 2018-03-25 ENCOUNTER — Ambulatory Visit
Admission: RE | Admit: 2018-03-25 | Discharge: 2018-03-25 | Disposition: A | Discharge status: Home / Self Care | Payer: Self-pay | Source: Ambulatory Visit | Attending: Radiation Oncology | Admitting: Radiation Oncology

## 2018-03-25 VITALS — BP 121/85 | HR 94 | Temp 97.9°F | Resp 18 | Ht 69.0 in | Wt 134.7 lb

## 2018-03-25 DIAGNOSIS — E871 Hypo-osmolality and hyponatremia: Secondary | ICD-10-CM

## 2018-03-25 DIAGNOSIS — C3411 Malignant neoplasm of upper lobe, right bronchus or lung: Secondary | ICD-10-CM

## 2018-03-25 DIAGNOSIS — Z5111 Encounter for antineoplastic chemotherapy: Secondary | ICD-10-CM | POA: Diagnosis not present

## 2018-03-25 LAB — CBC WITH DIFFERENTIAL/PLATELET
Abs Immature Granulocytes: 0.06 10*3/uL (ref 0.00–0.07)
BASOS ABS: 0 10*3/uL (ref 0.0–0.1)
BASOS PCT: 0 %
EOS ABS: 0 10*3/uL (ref 0.0–0.5)
Eosinophils Relative: 0 %
HCT: 37.6 % — ABNORMAL LOW (ref 39.0–52.0)
HEMOGLOBIN: 12.4 g/dL — AB (ref 13.0–17.0)
Immature Granulocytes: 1 %
LYMPHS PCT: 4 %
Lymphs Abs: 0.3 10*3/uL — ABNORMAL LOW (ref 0.7–4.0)
MCH: 28.4 pg (ref 26.0–34.0)
MCHC: 33 g/dL (ref 30.0–36.0)
MCV: 86 fL (ref 80.0–100.0)
Monocytes Absolute: 0.3 10*3/uL (ref 0.1–1.0)
Monocytes Relative: 4 %
NRBC: 0 % (ref 0.0–0.2)
Neutro Abs: 6.8 10*3/uL (ref 1.7–7.7)
Neutrophils Relative %: 91 %
PLATELETS: 179 10*3/uL (ref 150–400)
RBC: 4.37 MIL/uL (ref 4.22–5.81)
RDW: 13 % (ref 11.5–15.5)
WBC: 7.4 10*3/uL (ref 4.0–10.5)

## 2018-03-25 LAB — COMPREHENSIVE METABOLIC PANEL
ALT: 23 U/L (ref 0–44)
AST: 22 U/L (ref 15–41)
Albumin: 3.6 g/dL (ref 3.5–5.0)
Alkaline Phosphatase: 61 U/L (ref 38–126)
Anion gap: 7 (ref 5–15)
BUN: 34 mg/dL — ABNORMAL HIGH (ref 6–20)
CHLORIDE: 96 mmol/L — AB (ref 98–111)
CO2: 30 mmol/L (ref 22–32)
Calcium: 8.6 mg/dL — ABNORMAL LOW (ref 8.9–10.3)
Creatinine, Ser: 1.23 mg/dL (ref 0.61–1.24)
Glucose, Bld: 129 mg/dL — ABNORMAL HIGH (ref 70–99)
Potassium: 4.7 mmol/L (ref 3.5–5.1)
Sodium: 133 mmol/L — ABNORMAL LOW (ref 135–145)
TOTAL PROTEIN: 6.4 g/dL — AB (ref 6.5–8.1)
Total Bilirubin: 0.6 mg/dL (ref 0.3–1.2)

## 2018-03-25 NOTE — Progress Notes (Signed)
No new changes noted today 

## 2018-03-26 ENCOUNTER — Ambulatory Visit
Admission: RE | Admit: 2018-03-26 | Discharge: 2018-03-26 | Disposition: A | Discharge status: Home / Self Care | Payer: BLUE CROSS/BLUE SHIELD | Source: Ambulatory Visit | Attending: Radiation Oncology | Admitting: Radiation Oncology

## 2018-03-26 ENCOUNTER — Ambulatory Visit: Payer: BLUE CROSS/BLUE SHIELD

## 2018-03-26 DIAGNOSIS — C3491 Malignant neoplasm of unspecified part of right bronchus or lung: Secondary | ICD-10-CM | POA: Insufficient documentation

## 2018-03-26 DIAGNOSIS — Z510 Encounter for antineoplastic radiation therapy: Secondary | ICD-10-CM | POA: Insufficient documentation

## 2018-03-26 NOTE — Progress Notes (Signed)
Hematology/Oncology Consult note [facility]  Telephone:([phone] [phone]  Patient Care Team: Patient, No Pcp Per as PCP - General ([place]) Telford Nab, RN as Registered Nurse   Name of the patient: Alexander [name]  [id number]  1965/08/17   Date of visit: 03/26/18  Diagnosis- lung adenocarcinoma Stage IIIB cT3 cN2 cM0  Chief complaint/ Reason for visit-toxicity check after 1 cycle of cisplatin and Alimta  Heme/Onc history: patient is a 52 year old male with a long-standing history of smoking. He has smoked 1 to 1/2 pack of cigarettes per day for over 30 years. He presented to outside urgent care with some symptoms of shortness of breath and was treated for URI. He subsequently presented with facial and neck swelling which was thought by outside ER to be secondary to a drug reaction. Following that patient came to ER here at [facility]  He underwent CT chest abdomen and pelvis which showed 2.1 x 2 cm irregular mass in the right upper lobe concerning for malignancy.. Multiple collateral veins in chest and abdomen probable thrombosis of the left brachiocephalic vein. This is consistent with IVC syndrome Probable right paratracheal right hilar and precarinal adenopathy resulting in severe narrowing and thrombosis of SVC. This is consistent with SVC syndrome  Patient was seen by vascular surgery and there was no acute need for any endovascular therapies.  He is on anticoagulation with Coumadin which has been subsequently changed to Eliquis as an outpatient.  Patient initially underwent a bronchoscopy guided biopsy of the mediastinal lymph nodes which was inconclusive and underwent a second IR guided biopsy of the right upper lobe lung mass which was consistent with adenocarcinoma  Patient started radiation treatment on 03/15/2018.  Plan is to give 4 cycles of cisplatin and Alimta followed by maintenance durvalumab.  Cycle 1 of cisplatin  Alimta given on 03/16/2018  Interval history-he has tolerated chemotherapy well so far.  Reports no shortness of breath or difficulty swallowing.  He does have intermittent hiccups for which she has been taking PRN Thorazine but has been helping him.  Reports mild fatigue.  Denies any other complaints  ECOG PS- 1 Pain scale- 0 Opioid associated constipation- no  Review of systems- Review of Systems  Constitutional: Positive for malaise/fatigue. Negative for chills, fever and weight loss.  HENT: Negative for congestion, ear discharge and nosebleeds.   Eyes: Negative for blurred vision.  Respiratory: Negative for cough, hemoptysis, sputum production, shortness of breath and wheezing.   Cardiovascular: Negative for chest pain, palpitations, orthopnea and claudication.  Gastrointestinal: Negative for abdominal pain, blood in stool, constipation, diarrhea, heartburn, melena, nausea and vomiting.  Genitourinary: Negative for dysuria, flank pain, frequency, hematuria and urgency.  Musculoskeletal: Negative for back pain, joint pain and myalgias.  Skin: Negative for rash.  Neurological: Negative for dizziness, tingling, focal weakness, seizures, weakness and headaches.  Endo/Heme/Allergies: Does not bruise/bleed easily.  Psychiatric/Behavioral: Negative for depression and suicidal ideas. The patient does not have insomnia.       Allergies  Allergen Reactions  . Doxycycline Swelling  . Penicillins      Past Medical History:  Diagnosis Date  . COPD (chronic obstructive pulmonary disease) ([place])   . Lung mass      Past Surgical History:  Procedure Laterality Date  . ENDOBRONCHIAL ULTRASOUND N/A 03/11/2018   Procedure: ENDOBRONCHIAL ULTRASOUND;  Surgeon: Tyler Pita, MD;  Location: [facility] ORS;  Service: Cardiopulmonary;  Laterality: N/A;  . none    . PORTA CATH INSERTION  N/A 03/12/2018   Procedure: PORTA CATH INSERTION, thigh;  Surgeon: Katha Cabal, MD;  Location: [place] [facility];  Service: Cardiovascular;  Laterality: N/A;    Social History   Socioeconomic History  . Marital status: Divorced    Spouse name: Not on file  . Number of children: Not on file  . Years of education: Not on file  . Highest education level: Not on file  Occupational History  . Occupation: Cabin crew  Social Needs  . Financial resource strain: Not on file  . Food insecurity:    Worry: Not on file    Inability: Not on file  . Transportation needs:    Medical: Not on file    Non-medical: Not on file  Tobacco Use  . Smoking status: Current Every Day Smoker  . Smokeless tobacco: Never Used  Substance and Sexual Activity  . Alcohol use: Not Currently  . Drug use: Not Currently  . Sexual activity: Yes  Lifestyle  . Physical activity:    Days per week: Not on file    Minutes per session: Not on file  . Stress: Not on file  Relationships  . Social connections:    Talks on phone: Not on file    Gets together: Not on file    Attends religious service: Not on file    Active member of club or organization: Not on file    Attends meetings of clubs or organizations: Not on file    Relationship status: Not on file  . Intimate partner violence:    Fear of current or ex partner: Not on file    Emotionally abused: Not on file    Physically abused: Not on file    Forced sexual activity: Not on file  Other Topics Concern  . Not on file  Social History Narrative  . Not on file    History reviewed. No pertinent family history.   Current Outpatient Medications:  .  ELIQUIS STARTER PACK (ELIQUIS STARTER PACK) 5 MG TABS, Take as directed on package: start with two-5mg  tablets twice daily for 7 days. On day 8, switch to one-5mg  tablet twice daily., Disp: 1 each, Rfl: 0 .  folic acid (FOLVITE) 1 MG tablet, Take 1 tablet (1 mg total) by mouth daily. Start 5-7 days before Alimta chemotherapy. Continue until 21 days after Alimta completed., Disp: 100 tablet, Rfl: 3 .   chlorproMAZINE (THORAZINE) 25 MG tablet, Take 1 tablet (25 mg total) by mouth every 6 (six) hours as needed (hiccups). (Patient not taking: Reported on 03/25/2018), Disp: 30 tablet, Rfl: 1 .  dexamethasone (DECADRON) 4 MG tablet, Take 1 tab two times a day the day before Alimta chemo. Take 2 tabs the day after chemo, then take 2 tabs two times a day for 2 days. (Patient not taking: Reported on 03/25/2018), Disp: 30 tablet, Rfl: 1 .  EPINEPHrine 0.3 mg/0.3 mL IJ SOAJ injection, Inject into the muscle., Disp: , Rfl:  .  lidocaine-prilocaine (EMLA) cream, Apply to affected area once (Patient not taking: Reported on 03/25/2018), Disp: 30 g, Rfl: 3 .  ondansetron (ZOFRAN) 8 MG tablet, Take 1 tablet (8 mg total) by mouth 2 (two) times daily as needed (Nausea or vomiting). Start if needed on the third day after chemotherapy. (Patient not taking: Reported on 03/25/2018), Disp: 30 tablet, Rfl: 1 .  oxyCODONE-acetaminophen (PERCOCET) 10-325 MG tablet, Take by mouth., Disp: , Rfl:  .  oxyCODONE-acetaminophen (PERCOCET/ROXICET) 5-325 MG tablet, Take 1-2 tablets by  mouth every 4 (four) hours as needed for moderate pain. (Patient not taking: Reported on 03/25/2018), Disp: 30 tablet, Rfl: 0  Physical exam:  Vitals:   03/25/18 1002  BP: 121/85  Pulse: 94  Resp: 18  Temp: 97.9 F (36.6 C)  TempSrc: Tympanic  SpO2: 98%  Weight: 134 lb 11.2 oz (61.1 kg)  Height: 5\' 9"  (1.753 m)   Physical Exam  Constitutional: He is oriented to person, place, and time. He appears well-developed and well-nourished.  HENT:  Head: Normocephalic and atraumatic.  Bilateral neck swelling appears mildly improved  Eyes: Pupils are equal, round, and reactive to light. EOM are normal.  Neck: Normal range of motion.  Cardiovascular: Normal rate, regular rhythm and normal heart sounds.  Pulmonary/Chest: Effort normal and breath sounds normal.  Abdominal: Soft. Bowel sounds are normal.  Neurological: He is alert and oriented to  person, place, and time.  Skin: Skin is warm and dry.     CMP Latest Ref Rng & Units 03/25/2018  Glucose 70 - 99 mg/dL 129(H)  BUN 6 - 20 mg/dL 34(H)  Creatinine 0.61 - 1.24 mg/dL 1.23  Sodium 135 - 145 mmol/L 133(L)  Potassium 3.5 - 5.1 mmol/L 4.7  Chloride 98 - 111 mmol/L 96(L)  CO2 22 - 32 mmol/L 30  Calcium 8.9 - 10.3 mg/dL 8.6(L)  Total Protein 6.5 - 8.1 g/dL 6.4(L)  Total Bilirubin 0.3 - 1.2 mg/dL 0.6  Alkaline Phos 38 - 126 U/L 61  AST 15 - 41 U/L 22  ALT 0 - 44 U/L 23   CBC Latest Ref Rng & Units 03/25/2018  WBC 4.0 - 10.5 K/uL 7.4  Hemoglobin 13.0 - 17.0 g/dL 12.4(L)  Hematocrit 39.0 - 52.0 % 37.6(L)  Platelets 150 - 400 K/uL 179    No images are attached to the encounter.  Ct Chest W Contrast  Result Date: 03/09/2018 CLINICAL DATA:  Facial and neck swelling for several months. Abdominal pain. EXAM: CT CHEST, ABDOMEN, AND PELVIS WITH CONTRAST TECHNIQUE: Multidetector CT imaging of the chest, abdomen and pelvis was performed following the standard protocol during bolus administration of intravenous contrast. CONTRAST:  160mL ISOVUE-300 IOPAMIDOL (ISOVUE-300) INJECTION 61% COMPARISON:  None. FINDINGS: CT CHEST FINDINGS Cardiovascular: There is no evidence of thoracic aortic dissection or aneurysm. Normal cardiac size. No pericardial effusion is noted. 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 Small amount of contrast is seen flowing into the right atrium. This results in multiple collateral veins. Probable thrombotic occlusion of left brachiocephalic vein is noted as well. Mediastinum/Nodes: Previously described SVC thrombosis and severe narrowing appears to be due to ill-defined soft tissue mass in right paratracheal and precarinal area measuring 3.2 x 2.3 cm, as well as right hilar mass or node measuring 2.2 x 1.7 cm. Thyroid gland is unremarkable. Esophagus is unremarkable. Lungs/Pleura: No pneumothorax or pleural effusion is noted.  Emphysematous disease is noted in both upper lobes. 2.1 x 2.0 cm irregular mass is noted medially in the right upper lobe concerning for malignancy. Also noted is spiculated density measuring 12 x 9 mm with pleural tail in right upper lobe concerning for possible malignancy. Musculoskeletal: No chest wall mass or suspicious bone lesions identified. CT ABDOMEN PELVIS FINDINGS Hepatobiliary: No focal liver abnormality is seen. No gallstones, gallbladder wall thickening, or biliary dilatation. Pancreas: Unremarkable. No pancreatic ductal dilatation or surrounding inflammatory changes. Spleen: Normal in size without focal abnormality. Adrenals/Urinary Tract: Adrenal glands are unremarkable. Kidneys are normal, without renal calculi,  focal lesion, or hydronephrosis. Bladder is unremarkable. Stomach/Bowel: The stomach appears normal. The appendix is not visualized. There is no evidence of bowel obstruction or inflammation. Vascular/Lymphatic: Atherosclerosis of abdominal aorta is noted without aneurysm or dissection. IVC and iliac veins appear to be patent. However, collateral veins are noted in the anterior subcutaneous tissues of the abdomen. No significant adenopathy is noted in the abdomen or pelvis. Reproductive: Prostate is unremarkable. Other: No abdominal wall hernia or abnormality. No abdominopelvic ascites. Musculoskeletal: No acute or significant osseous findings. IMPRESSION: 2.1 x 2.0 cm irregular mass is noted medially in right upper lobe concerning for malignancy. Also noted is probable right paratracheal, right hilar and precarinal adenopathy or malignancy, which results in severe narrowing and thrombosis of the superior vena cava. This results in multiple collateral veins in the chest and abdomen, as well as probable thrombosis of the left brachycephalic vein. This is consistent with SVC syndrome. PET scan is recommended for further evaluation. Aortic Atherosclerosis (ICD10-I70.0) and Emphysema  (ICD10-J43.9). Electronically Signed   By: Marijo Conception, M.D.   On: 03/09/2018 15:19   Mr Jeri Cos KV Contrast  Result Date: 03/10/2018 CLINICAL DATA:  52 y/o  M; lung cancer for staging. EXAM: MRI HEAD WITHOUT AND WITH CONTRAST TECHNIQUE: Multiplanar, multiecho pulse sequences of the brain and surrounding structures were obtained without and with intravenous contrast. CONTRAST:  6 cc Gadavist COMPARISON:  03/09/2018 CT of chest, abdomen, and pelvis. FINDINGS: Brain: No acute infarction, hemorrhage, hydrocephalus, extra-axial collection or mass lesion. Few nonspecific T2 FLAIR hyperintensities in subcortical and periventricular white matter are compatible with mild chronic microvascular ischemic changes for age. No abnormal enhancement. Vascular: Normal flow voids. Skull and upper cervical spine: Normal marrow signal. Sinuses/Orbits: Moderate mucosal thickening of paranasal sinuses. No abnormal signal of mastoid air cells. Orbits are unremarkable. Other: None. IMPRESSION: 1. No intracranial metastatic disease identified. 2. Mild chronic microvascular ischemic changes of the brain. 3. Moderate paranasal sinus mucosal thickening. Electronically Signed   By: Kristine Garbe M.D.   On: 03/10/2018 03:58   Ct Abdomen Pelvis W Contrast  Result Date: 03/09/2018 CLINICAL DATA:  Facial and neck swelling for several months. Abdominal pain. EXAM: CT CHEST, ABDOMEN, AND PELVIS WITH CONTRAST TECHNIQUE: Multidetector CT imaging of the chest, abdomen and pelvis was performed following the standard protocol during bolus administration of intravenous contrast. CONTRAST:  16mL ISOVUE-300 IOPAMIDOL (ISOVUE-300) INJECTION 61% COMPARISON:  None. FINDINGS: CT CHEST FINDINGS Cardiovascular: There is no evidence of thoracic aortic dissection or aneurysm. Normal cardiac size. No pericardial effusion is noted. 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 Small amount of contrast is seen  flowing into the right atrium. This results in multiple collateral veins. Probable thrombotic occlusion of left brachiocephalic vein is noted as well. Mediastinum/Nodes: Previously described SVC thrombosis and severe narrowing appears to be due to ill-defined soft tissue mass in right paratracheal and precarinal area measuring 3.2 x 2.3 cm, as well as right hilar mass or node measuring 2.2 x 1.7 cm. Thyroid gland is unremarkable. Esophagus is unremarkable. Lungs/Pleura: No pneumothorax or pleural effusion is noted. Emphysematous disease is noted in both upper lobes. 2.1 x 2.0 cm irregular mass is noted medially in the right upper lobe concerning for malignancy. Also noted is spiculated density measuring 12 x 9 mm with pleural tail in right upper lobe concerning for possible malignancy. Musculoskeletal: No chest wall mass or suspicious bone lesions identified. CT ABDOMEN PELVIS FINDINGS Hepatobiliary: No focal liver abnormality  is seen. No gallstones, gallbladder wall thickening, or biliary dilatation. Pancreas: Unremarkable. No pancreatic ductal dilatation or surrounding inflammatory changes. Spleen: Normal in size without focal abnormality. Adrenals/Urinary Tract: Adrenal glands are unremarkable. Kidneys are normal, without renal calculi, focal lesion, or hydronephrosis. Bladder is unremarkable. Stomach/Bowel: The stomach appears normal. The appendix is not visualized. There is no evidence of bowel obstruction or inflammation. Vascular/Lymphatic: Atherosclerosis of abdominal aorta is noted without aneurysm or dissection. IVC and iliac veins appear to be patent. However, collateral veins are noted in the anterior subcutaneous tissues of the abdomen. No significant adenopathy is noted in the abdomen or pelvis. Reproductive: Prostate is unremarkable. Other: No abdominal wall hernia or abnormality. No abdominopelvic ascites. Musculoskeletal: No acute or significant osseous findings. IMPRESSION: 2.1 x 2.0 cm irregular  mass is noted medially in right upper lobe concerning for malignancy. Also noted is probable right paratracheal, right hilar and precarinal adenopathy or malignancy, which results in severe narrowing and thrombosis of the superior vena cava. This results in multiple collateral veins in the chest and abdomen, as well as probable thrombosis of the left brachycephalic vein. This is consistent with SVC syndrome. PET scan is recommended for further evaluation. Aortic Atherosclerosis (ICD10-I70.0) and Emphysema (ICD10-J43.9). Electronically Signed   By: Marijo Conception, M.D.   On: 03/09/2018 15:19   Nm Pet Image Initial (pi) Skull Base To Thigh  Result Date: 03/18/2018 CLINICAL DATA:  Initial treatment strategy for right upper lobe adenocarcinoma. EXAM: NUCLEAR MEDICINE PET SKULL BASE TO THIGH TECHNIQUE: 7.3 mCi F-18 FDG was injected intravenously. Full-ring PET imaging was performed from the skull base to thigh after the radiotracer. CT data was obtained and used for attenuation correction and anatomic localization. Fasting blood glucose: 117 mg/dl COMPARISON:  Multiple exams, including 03/09/2018 FINDINGS: Mediastinal blood pool activity: SUV max 1.9 NECK: No significant abnormal hypermetabolic activity in this region. Incidental CT findings: none CHEST: Medial nodule in the right lung apex measuring 1.6 by 2.8 cm on image 75/3 abuts the pleural margin and has a maximum SUV of 6.1. No destruction of the adjacent ribs or vertebral body identified. Hypermetabolic confluent right paratracheal and right upper lobe hilar mass/adenopathy has a maximum SUV of 7.6, compatible with malignancy. A subcarinal node measuring approximately 1.3 cm in short axis on image 103/3 has a maximum SUV of 6.3, compatible with malignancy. Incidental CT findings: Mild mediastinal stranding and scattered small mediastinal lymph nodes are present. Some of this may relate to SVC narrowing/obstruction. Superior segment right upper lobe airway  obstruction by tumor. ABDOMEN/PELVIS: No significant abnormal hypermetabolic activity in this region. Incidental CT findings: Right upper thigh Port-A-Cath noted with tip extending into the suprarenal IVC. Aortoiliac atherosclerotic vascular disease. Trace gas noted along the subcutaneous tissues of the right anterior abdominal wall possibly from injection. SKELETON: No significant abnormal hypermetabolic activity in this region. Incidental CT findings: none IMPRESSION: 1. Medial pleural-based right apical lung nodule has a maximum SUV of 6.1, compatible with malignancy. 2. Hypermetabolic confluent right hilar/paratracheal adenopathy has a maximum SUV of 7.6, with tumor in this vicinity obstructing the superior segmental bronchus of the right upper lobe. 3. Hypermetabolic subcarinal lymph node SUV 6.3. 4. No findings of metastatic disease to the neck, abdomen/pelvis, or skeleton. 5. Stranding in the mediastinum, which may partially be due to narrowing/occlusion of the SVC. 6. Other imaging findings of potential clinical significance: Aortic Atherosclerosis (ICD10-I70.0). Right upper thigh Port-A-Cath with tip extending to the suprarenal IVC. Electronically Signed   By: Thayer Jew  Janeece Fitting M.D.   On: 03/18/2018 13:31   Ct Biopsy  Result Date: 03/15/2018 INDICATION: 52 year old male smoker with multifocal pulmonary nodules and probable metastatic mediastinal adenopathy resulting in SVC syndrome. He recently underwent a bronchoscopic biopsy which was nondiagnostic. He presents today for CT-guided biopsy to facilitate tissue diagnosis and allow for rapid treatment. EXAM: CT-guided biopsy right upper lobe pulmonary nodule Interventional Radiologist:  Criselda Peaches, MD MEDICATIONS: None. ANESTHESIA/SEDATION: Fentanyl 50 mcg IV; Versed 1.5 mg IV Moderate Sedation Time:  18 minutes The patient was continuously monitored during the procedure by the interventional radiology nurse under my direct supervision.  FLUOROSCOPY TIME:  Fluoroscopy Time: 0 minutes 0 seconds (0 mGy). COMPLICATIONS: None immediate. Estimated blood loss:  0 PROCEDURE: Informed written consent was obtained from the patient after a thorough discussion of the procedural risks, benefits and alternatives. All questions were addressed. Maximal Sterile Barrier Technique was utilized including caps, mask, sterile gowns, sterile gloves, sterile drape, hand hygiene and skin antiseptic. A timeout was performed prior to the initiation of the procedure. A planning axial CT scan was performed. The nodule in the right upper lobe was successfully identified. A suitable skin entry site was selected and marked. The region was then sterilely prepped and draped in standard fashion using Betadine skin prep. Local anesthesia was attained by infiltration with 1% lidocaine. A small dermatotomy was made. Under intermittent CT fluoroscopic guidance, a 17 gauge trocar needle was advanced into the lung and positioned at the margin of the nodule. Multiple 18 gauge core biopsies were then coaxially obtained using the BioPince automated biopsy device. Biopsy specimens were placed in saline and delivered to pathology for further analysis. The biopsy device and introducer needle were removed. Post biopsy axial CT imaging demonstrates no evidence of immediate complication. There is no pneumothorax. The patient tolerated the procedure well. IMPRESSION: Technically successful CT-guided biopsy medial right upper lobe pulmonary nodule. Electronically Signed   By: Jacqulynn Cadet M.D.   On: 03/15/2018 08:34   Dg Chest Port 1 View  Result Date: 03/12/2018 CLINICAL DATA:  Lung biopsy on 03/13/2018 EXAM: PORTABLE CHEST 1 VIEW COMPARISON:  03/09/2018 CT FINDINGS: No pneumothorax is identified. Emphysematous hyperinflation of the lungs is noted. The medial right upper lobe 2 cm nodular opacity as seen on prior chest CT is difficult to identify radiographically, likely from  superimposition of pulmonary vasculature and mediastinum. No aggressive osseous lesions. Subchondral cyst of the glenoid on the right. IMPRESSION: No pneumothorax status post lung biopsy.  COPD. Electronically Signed   By: Ashley Royalty M.D.   On: 03/12/2018 18:29     Assessment and plan- Patient is a 52 y.o. male adenocarcinoma of the right lung stage IIIb CT 3 N2 M0 presenting as SVC syndrome status post 1 cycle of cisplatin and Alimta and ongoing concurrent radiation  Clinically patient seems to be responding to treatment and his bilateral neck swelling is better.  His last chemo was about a week ago and he has tolerated that relatively well without any significant nausea vomiting.  He has PRN Thorazine for his hiccups.  Patient has mild hyponatremia today and his creatinine is mildly elevated at 1.2 as compared to his baseline of 0.9.  I gave him the option of getting IV fluids today in the clinic versus trying to increase his oral intake to at least 40 to 50 ounces daily.  Patient wishes to try increasing his oral intake first.  I will see him back in 2 weeks time with CBC  and CMP for cycle 2 of cisplatin and Alimta  He will continue Eliquis for SVC thrombosis   Visit Diagnosis 1. Malignant neoplasm of upper lobe of right lung ([place])   2. Hyponatremia      Dr. Randa Evens, MD, MPH [facility] [phone] 03/26/2018 12:41 PM

## 2018-03-29 ENCOUNTER — Ambulatory Visit
Admission: RE | Admit: 2018-03-29 | Discharge: 2018-03-29 | Disposition: A | Discharge status: Home / Self Care | Payer: BLUE CROSS/BLUE SHIELD | Source: Ambulatory Visit | Attending: Radiation Oncology | Admitting: Radiation Oncology

## 2018-03-29 ENCOUNTER — Ambulatory Visit: Payer: BLUE CROSS/BLUE SHIELD

## 2018-03-29 DIAGNOSIS — Z510 Encounter for antineoplastic radiation therapy: Secondary | ICD-10-CM | POA: Diagnosis not present

## 2018-03-30 ENCOUNTER — Ambulatory Visit: Payer: BLUE CROSS/BLUE SHIELD

## 2018-03-30 ENCOUNTER — Ambulatory Visit
Admission: RE | Admit: 2018-03-30 | Discharge: 2018-03-30 | Disposition: A | Discharge status: Home / Self Care | Payer: BLUE CROSS/BLUE SHIELD | Source: Ambulatory Visit | Attending: Radiation Oncology | Admitting: Radiation Oncology

## 2018-03-30 DIAGNOSIS — Z510 Encounter for antineoplastic radiation therapy: Secondary | ICD-10-CM | POA: Diagnosis not present

## 2018-03-31 ENCOUNTER — Ambulatory Visit: Payer: BLUE CROSS/BLUE SHIELD

## 2018-03-31 ENCOUNTER — Ambulatory Visit
Admission: RE | Admit: 2018-03-31 | Discharge: 2018-03-31 | Disposition: A | Discharge status: Home / Self Care | Payer: BLUE CROSS/BLUE SHIELD | Source: Ambulatory Visit | Attending: Radiation Oncology | Admitting: Radiation Oncology

## 2018-03-31 DIAGNOSIS — Z510 Encounter for antineoplastic radiation therapy: Secondary | ICD-10-CM | POA: Diagnosis not present

## 2018-04-01 ENCOUNTER — Ambulatory Visit: Payer: BLUE CROSS/BLUE SHIELD

## 2018-04-01 ENCOUNTER — Ambulatory Visit
Admission: RE | Admit: 2018-04-01 | Discharge: 2018-04-01 | Disposition: A | Discharge status: Home / Self Care | Payer: BLUE CROSS/BLUE SHIELD | Source: Ambulatory Visit | Attending: Radiation Oncology | Admitting: Radiation Oncology

## 2018-04-01 DIAGNOSIS — Z510 Encounter for antineoplastic radiation therapy: Secondary | ICD-10-CM | POA: Diagnosis not present

## 2018-04-02 ENCOUNTER — Ambulatory Visit: Payer: BLUE CROSS/BLUE SHIELD

## 2018-04-02 ENCOUNTER — Ambulatory Visit
Admission: RE | Admit: 2018-04-02 | Discharge: 2018-04-02 | Disposition: A | Discharge status: Home / Self Care | Payer: BLUE CROSS/BLUE SHIELD | Source: Ambulatory Visit | Attending: Radiation Oncology | Admitting: Radiation Oncology

## 2018-04-02 DIAGNOSIS — Z510 Encounter for antineoplastic radiation therapy: Secondary | ICD-10-CM | POA: Diagnosis not present

## 2018-04-05 ENCOUNTER — Ambulatory Visit
Admission: RE | Admit: 2018-04-05 | Discharge: 2018-04-05 | Disposition: A | Discharge status: Home / Self Care | Payer: BLUE CROSS/BLUE SHIELD | Source: Ambulatory Visit | Attending: Radiation Oncology | Admitting: Radiation Oncology

## 2018-04-05 DIAGNOSIS — Z510 Encounter for antineoplastic radiation therapy: Secondary | ICD-10-CM | POA: Diagnosis not present

## 2018-04-06 ENCOUNTER — Ambulatory Visit
Admission: RE | Admit: 2018-04-06 | Discharge: 2018-04-06 | Disposition: A | Discharge status: Home / Self Care | Payer: BLUE CROSS/BLUE SHIELD | Source: Ambulatory Visit | Attending: Radiation Oncology | Admitting: Radiation Oncology

## 2018-04-06 DIAGNOSIS — Z510 Encounter for antineoplastic radiation therapy: Secondary | ICD-10-CM | POA: Diagnosis not present

## 2018-04-07 ENCOUNTER — Ambulatory Visit
Admission: RE | Admit: 2018-04-07 | Discharge: 2018-04-07 | Disposition: A | Discharge status: Home / Self Care | Payer: BLUE CROSS/BLUE SHIELD | Source: Ambulatory Visit | Attending: Radiation Oncology | Admitting: Radiation Oncology

## 2018-04-07 ENCOUNTER — Other Ambulatory Visit: Payer: Self-pay | Admitting: Oncology

## 2018-04-07 DIAGNOSIS — Z510 Encounter for antineoplastic radiation therapy: Secondary | ICD-10-CM | POA: Diagnosis not present

## 2018-04-07 DIAGNOSIS — I82210 Acute embolism and thrombosis of superior vena cava: Secondary | ICD-10-CM

## 2018-04-07 MED ORDER — APIXABAN 5 MG PO TABS: 5.0000 mg | ORAL_TABLET | Freq: Two times a day (BID) | ORAL | 3 refills | Status: DC

## 2018-04-08 ENCOUNTER — Inpatient Hospital Stay: Payer: BLUE CROSS/BLUE SHIELD | Attending: Oncology

## 2018-04-08 ENCOUNTER — Encounter: Payer: Self-pay | Admitting: Oncology

## 2018-04-08 ENCOUNTER — Ambulatory Visit
Admission: RE | Admit: 2018-04-08 | Discharge: 2018-04-08 | Disposition: A | Discharge status: Home / Self Care | Payer: BLUE CROSS/BLUE SHIELD | Source: Ambulatory Visit | Attending: Radiation Oncology | Admitting: Radiation Oncology

## 2018-04-08 ENCOUNTER — Inpatient Hospital Stay (HOSPITAL_BASED_OUTPATIENT_CLINIC_OR_DEPARTMENT_OTHER): Payer: BLUE CROSS/BLUE SHIELD | Admitting: Oncology

## 2018-04-08 ENCOUNTER — Inpatient Hospital Stay: Payer: BLUE CROSS/BLUE SHIELD

## 2018-04-08 VITALS — BP 103/71 | HR 108 | Temp 97.2°F | Resp 18 | Ht 69.0 in | Wt 136.0 lb

## 2018-04-08 DIAGNOSIS — Z5111 Encounter for antineoplastic chemotherapy: Secondary | ICD-10-CM | POA: Insufficient documentation

## 2018-04-08 DIAGNOSIS — C3411 Malignant neoplasm of upper lobe, right bronchus or lung: Secondary | ICD-10-CM

## 2018-04-08 DIAGNOSIS — I82210 Acute embolism and thrombosis of superior vena cava: Secondary | ICD-10-CM | POA: Insufficient documentation

## 2018-04-08 DIAGNOSIS — Z7901 Long term (current) use of anticoagulants: Secondary | ICD-10-CM

## 2018-04-08 DIAGNOSIS — Z510 Encounter for antineoplastic radiation therapy: Secondary | ICD-10-CM | POA: Diagnosis not present

## 2018-04-08 LAB — COMPREHENSIVE METABOLIC PANEL
ALK PHOS: 152 U/L — AB (ref 38–126)
ALT: 53 U/L — AB (ref 0–44)
ANION GAP: 9 (ref 5–15)
AST: 27 U/L (ref 15–41)
Albumin: 3.4 g/dL — ABNORMAL LOW (ref 3.5–5.0)
BUN: 24 mg/dL — AB (ref 6–20)
CALCIUM: 8.8 mg/dL — AB (ref 8.9–10.3)
CO2: 25 mmol/L (ref 22–32)
Chloride: 101 mmol/L (ref 98–111)
Creatinine, Ser: 1.08 mg/dL (ref 0.61–1.24)
GFR calc Af Amer: >60 mL/min (ref >60)
GFR calc non Af Amer: >60 mL/min (ref >60)
Glucose, Bld: 140 mg/dL — ABNORMAL HIGH (ref 70–99)
Potassium: 4.2 mmol/L (ref 3.5–5.1)
SODIUM: 135 mmol/L (ref 135–145)
TOTAL PROTEIN: 6.6 g/dL (ref 6.5–8.1)
Total Bilirubin: 0.2 mg/dL — ABNORMAL LOW (ref 0.3–1.2)

## 2018-04-08 LAB — CBC WITH DIFFERENTIAL/PLATELET
Abs Immature Granulocytes: 0.09 10*3/uL — ABNORMAL HIGH (ref 0.00–0.07)
BASOS PCT: 0 %
Basophils Absolute: 0 10*3/uL (ref 0.0–0.1)
EOS ABS: 0 10*3/uL (ref 0.0–0.5)
Eosinophils Relative: 0 %
HCT: 34.6 % — ABNORMAL LOW (ref 39.0–52.0)
Hemoglobin: 11.1 g/dL — ABNORMAL LOW (ref 13.0–17.0)
Immature Granulocytes: 1 %
Lymphocytes Relative: 9 %
Lymphs Abs: 0.8 10*3/uL (ref 0.7–4.0)
MCH: 28.4 pg (ref 26.0–34.0)
MCHC: 32.1 g/dL (ref 30.0–36.0)
MCV: 88.5 fL (ref 80.0–100.0)
MONO ABS: 0.4 10*3/uL (ref 0.1–1.0)
Monocytes Relative: 4 %
NEUTROS ABS: 7.2 10*3/uL (ref 1.7–7.7)
Neutrophils Relative %: 86 %
PLATELETS: 176 10*3/uL (ref 150–400)
RBC: 3.91 MIL/uL — AB (ref 4.22–5.81)
RDW: 13.6 % (ref 11.5–15.5)
WBC: 8.4 10*3/uL (ref 4.0–10.5)
nRBC: 0 % (ref 0.0–0.2)

## 2018-04-08 MED ORDER — APIXABAN 5 MG PO TABS: 5.0000 mg | ORAL_TABLET | Freq: Two times a day (BID) | ORAL | 3 refills | Status: DC

## 2018-04-08 MED ORDER — POTASSIUM CHLORIDE 2 MEQ/ML IV SOLN
Freq: Once | INTRAVENOUS | Status: AC
  Administered 2018-04-08: 10:00:00 via INTRAVENOUS
  Filled 2018-04-08: qty 1000

## 2018-04-08 MED ORDER — SODIUM CHLORIDE 0.9% FLUSH
10.0000 mL | Freq: Once | INTRAVENOUS | Status: AC
  Administered 2018-04-08: 10 mL via INTRAVENOUS
  Filled 2018-04-08: qty 10

## 2018-04-08 MED ORDER — PALONOSETRON HCL INJECTION 0.25 MG/5ML
0.2500 mg | Freq: Once | INTRAVENOUS | Status: AC
  Administered 2018-04-08: 0.25 mg via INTRAVENOUS
  Filled 2018-04-08: qty 5

## 2018-04-08 MED ORDER — SODIUM CHLORIDE 0.9 % IV SOLN
Freq: Once | INTRAVENOUS | Status: AC
  Administered 2018-04-08: 12:00:00 via INTRAVENOUS
  Filled 2018-04-08: qty 5

## 2018-04-08 MED ORDER — HEPARIN SOD (PORK) LOCK FLUSH 100 UNIT/ML IV SOLN: 500.0000 [IU] | Freq: Once | INTRAVENOUS | Status: DC

## 2018-04-08 MED ORDER — SODIUM CHLORIDE 0.9 % IV SOLN
75.0000 mg/m2 | Freq: Once | INTRAVENOUS | Status: AC
  Administered 2018-04-08: 131 mg via INTRAVENOUS
  Filled 2018-04-08: qty 131

## 2018-04-08 MED ORDER — SODIUM CHLORIDE 0.9 % IV SOLN
900.0000 mg | Freq: Once | INTRAVENOUS | Status: AC
  Administered 2018-04-08: 900 mg via INTRAVENOUS
  Filled 2018-04-08: qty 20

## 2018-04-08 MED ORDER — HEPARIN SOD (PORK) LOCK FLUSH 100 UNIT/ML IV SOLN
500.0000 [IU] | Freq: Once | INTRAVENOUS | Status: AC | PRN
  Administered 2018-04-08: 500 [IU]

## 2018-04-08 MED ORDER — SODIUM CHLORIDE 0.9 % IV SOLN
Freq: Once | INTRAVENOUS | Status: AC
  Administered 2018-04-08: 10:00:00 via INTRAVENOUS
  Filled 2018-04-08: qty 250

## 2018-04-08 MED ORDER — DEXAMETHASONE 4 MG PO TABS: ORAL_TABLET | ORAL | 1 refills | Status: DC

## 2018-04-08 NOTE — Progress Notes (Signed)
No new changes noted today 

## 2018-04-08 NOTE — Progress Notes (Signed)
Hematology/Oncology Consult note [facility]  Telephone:([phone] [phone]  Patient Care Team: Patient, No Pcp Per as PCP - General ([place]) Telford Nab, RN as Registered Nurse   Name of the patient: Alexander [name]  [id number]  23-Jun-1965   Date of visit: 04/08/18  Diagnosis-  lung adenocarcinoma Stage IIIB cT3 cN2 cM0  Chief complaint/ Reason for visit-on treatment assessment prior to cycle 2 of cisplatin and Alimta  Heme/Onc history: patient is a 52 year old male with a long-standing history of smoking. He has smoked 1 to 1/2 pack of cigarettes per day for over 30 years. He presented to outside urgent care with some symptoms of shortness of breath and was treated for URI. He subsequently presented with facial and neck swelling which was thought by outside ER to be secondary to a drug reaction. Following that patient came to ER here at [facility].  He underwent CT chest abdomen and pelvis which showed 2.1 x 2 cm irregular mass in the right upper lobe concerning for malignancy.. Multiple collateral veins in chest and abdomen probable thrombosis of the left brachiocephalic vein. This is consistent with IVC syndrome Probable right paratracheal right hilar and precarinal adenopathy resulting in severe narrowing and thrombosis of SVC. This is consistent with SVC syndrome  Patient was seen by vascular surgery and there was no acute need for any endovascular therapies. He is on anticoagulation with Coumadin which has been subsequently changed to Eliquis as an outpatient. Patient initially underwent a bronchoscopy guided biopsy of the mediastinal lymph nodes which was inconclusive and underwent a second IR guided biopsy of the right upper lobe lung mass which was consistent with adenocarcinoma  Patient started radiation treatment on 03/15/2018.  Plan is to give 4 cycles of cisplatin and Alimta followed by maintenance durvalumab.  Cycle 1  of cisplatin Alimta given on 03/16/2018  Interval history-feels a little fatigued.  Has intermittent hiccups which are self-limited.  He is trying to keep up with his oral intake.  Reports no difficulty swallowing.  Denies any pain nausea or vomiting  ECOG PS- 0 Pain scale- 0 Opioid associated constipation- no  Review of systems- Review of Systems  Constitutional: Negative for chills, fever, malaise/fatigue and weight loss.  HENT: Negative for congestion, ear discharge and nosebleeds.   Eyes: Negative for blurred vision.  Respiratory: Negative for cough, hemoptysis, sputum production, shortness of breath and wheezing.   Cardiovascular: Negative for chest pain, palpitations, orthopnea and claudication.  Gastrointestinal: Negative for abdominal pain, blood in stool, constipation, diarrhea, heartburn, melena, nausea and vomiting.  Genitourinary: Negative for dysuria, flank pain, frequency, hematuria and urgency.  Musculoskeletal: Negative for back pain, joint pain and myalgias.  Skin: Negative for rash.  Neurological: Negative for dizziness, tingling, focal weakness, seizures, weakness and headaches.  Endo/Heme/Allergies: Does not bruise/bleed easily.  Psychiatric/Behavioral: Negative for depression and suicidal ideas. The patient does not have insomnia.        Allergies  Allergen Reactions  . Doxycycline Swelling  . Penicillins      Past Medical History:  Diagnosis Date  . COPD (chronic obstructive pulmonary disease) ([place])   . Lung mass      Past Surgical History:  Procedure Laterality Date  . ENDOBRONCHIAL ULTRASOUND N/A 03/11/2018   Procedure: ENDOBRONCHIAL ULTRASOUND;  Surgeon: Tyler Pita, MD;  Location: [facility] ORS;  Service: Cardiopulmonary;  Laterality: N/A;  . none    . PORTA CATH INSERTION N/A 03/12/2018   Procedure: PORTA CATH INSERTION, thigh;  Surgeon: Katha Cabal, MD;  Location: [facility];  Service: Cardiovascular;  Laterality: N/A;     Social History   Socioeconomic History  . Marital status: Divorced    Spouse name: Not on file  . Number of children: Not on file  . Years of education: Not on file  . Highest education level: Not on file  Occupational History  . Occupation: Cabin crew  Social Needs  . Financial resource strain: Not on file  . Food insecurity:    Worry: Not on file    Inability: Not on file  . Transportation needs:    Medical: Not on file    Non-medical: Not on file  Tobacco Use  . Smoking status: Current Every Day Smoker  . Smokeless tobacco: Never Used  Substance and Sexual Activity  . Alcohol use: Not Currently  . Drug use: Not Currently  . Sexual activity: Yes  Lifestyle  . Physical activity:    Days per week: Not on file    Minutes per session: Not on file  . Stress: Not on file  Relationships  . Social connections:    Talks on phone: Not on file    Gets together: Not on file    Attends religious service: Not on file    Active member of club or organization: Not on file    Attends meetings of clubs or organizations: Not on file    Relationship status: Not on file  . Intimate partner violence:    Fear of current or ex partner: Not on file    Emotionally abused: Not on file    Physically abused: Not on file    Forced sexual activity: Not on file  Other Topics Concern  . Not on file  Social History Narrative  . Not on file    History reviewed. No pertinent family history.   Current Outpatient Medications:  .  apixaban (ELIQUIS) 5 MG TABS tablet, Take 1 tablet (5 mg total) by mouth 2 (two) times daily., Disp: 60 tablet, Rfl: 3 .  EPINEPHrine 0.3 mg/0.3 mL IJ SOAJ injection, Inject into the muscle., Disp: , Rfl:  .  folic acid (FOLVITE) 1 MG tablet, Take 1 tablet (1 mg total) by mouth daily. Start 5-7 days before Alimta chemotherapy. Continue until 21 days after Alimta completed., Disp: 100 tablet, Rfl: 3 .  oxyCODONE-acetaminophen (PERCOCET) 10-325 MG tablet, Take by  mouth., Disp: , Rfl:  .  chlorproMAZINE (THORAZINE) 25 MG tablet, Take 1 tablet (25 mg total) by mouth every 6 (six) hours as needed (hiccups). (Patient not taking: Reported on 03/25/2018), Disp: 30 tablet, Rfl: 1 .  dexamethasone (DECADRON) 4 MG tablet, Take 1 tab two times a day the day before Alimta chemo. Take 2 tabs the day after chemo, then take 2 tabs two times a day for 2 days., Disp: 30 tablet, Rfl: 1 .  lidocaine-prilocaine (EMLA) cream, Apply to affected area once (Patient not taking: Reported on 03/25/2018), Disp: 30 g, Rfl: 3 .  ondansetron (ZOFRAN) 8 MG tablet, Take 1 tablet (8 mg total) by mouth 2 (two) times daily as needed (Nausea or vomiting). Start if needed on the third day after chemotherapy. (Patient not taking: Reported on 03/25/2018), Disp: 30 tablet, Rfl: 1 .  oxyCODONE-acetaminophen (PERCOCET/ROXICET) 5-325 MG tablet, Take 1-2 tablets by mouth every 4 (four) hours as needed for moderate pain. (Patient not taking: Reported on 03/25/2018), Disp: 30 tablet, Rfl: 0 No current facility-administered medications for this visit.  Facility-Administered Medications Ordered in Other Visits:  .  CISplatin (PLATINOL) 131 mg in sodium chloride 0.9 % 500 mL chemo infusion, 75 mg/m2 (Treatment Plan Recorded), Intravenous, Once, Sindy Guadeloupe, MD .  fosaprepitant (EMEND) 150 mg, dexamethasone (DECADRON) 12 mg in sodium chloride 0.9 % 145 mL IVPB, , Intravenous, Once, Sindy Guadeloupe, MD .  heparin lock flush 100 unit/mL, 500 Units, Intravenous, Once, Sindy Guadeloupe, MD .  heparin lock flush 100 unit/mL, 500 Units, Intracatheter, Once PRN, Sindy Guadeloupe, MD .  palonosetron (ALOXI) injection 0.25 mg, 0.25 mg, Intravenous, Once, Sindy Guadeloupe, MD .  PEMEtrexed (ALIMTA) 900 mg in sodium chloride 0.9 % 100 mL chemo infusion, 900 mg, Intravenous, Once, Sindy Guadeloupe, MD  Physical exam:  Vitals:   04/08/18 0843  BP: 103/71  Pulse: (!) 108  Resp: 18  Temp: (!) 97.2 F (36.2 C)  TempSrc:  Tympanic  SpO2: 97%  Weight: 136 lb (61.7 kg)  Height: 5\' 9"  (1.753 m)   Physical Exam  Constitutional: He is oriented to person, place, and time. He appears well-developed and well-nourished.  HENT:  Head: Normocephalic and atraumatic.  Distended neck veins  Eyes: Pupils are equal, round, and reactive to light. EOM are normal.  Neck: Normal range of motion.  Cardiovascular: Normal rate, regular rhythm and normal heart sounds.  Pulmonary/Chest: Effort normal and breath sounds normal.  Abdominal: Soft. Bowel sounds are normal.  Neurological: He is alert and oriented to person, place, and time.  Skin: Skin is warm and dry.     CMP Latest Ref Rng & Units 04/08/2018  Glucose 70 - 99 mg/dL 140(H)  BUN 6 - 20 mg/dL 24(H)  Creatinine 0.61 - 1.24 mg/dL 1.08  Sodium 135 - 145 mmol/L 135  Potassium 3.5 - 5.1 mmol/L 4.2  Chloride 98 - 111 mmol/L 101  CO2 22 - 32 mmol/L 25  Calcium 8.9 - 10.3 mg/dL 8.8(L)  Total Protein 6.5 - 8.1 g/dL 6.6  Total Bilirubin 0.3 - 1.2 mg/dL 0.2(L)  Alkaline Phos 38 - 126 U/L 152(H)  AST 15 - 41 U/L 27  ALT 0 - 44 U/L 53(H)   CBC Latest Ref Rng & Units 04/08/2018  WBC 4.0 - 10.5 K/uL 8.4  Hemoglobin 13.0 - 17.0 g/dL 11.1(L)  Hematocrit 39.0 - 52.0 % 34.6(L)  Platelets 150 - 400 K/uL 176    No images are attached to the encounter.  Ct Chest W Contrast  Result Date: 03/09/2018 CLINICAL DATA:  Facial and neck swelling for several months. Abdominal pain. EXAM: CT CHEST, ABDOMEN, AND PELVIS WITH CONTRAST TECHNIQUE: Multidetector CT imaging of the chest, abdomen and pelvis was performed following the standard protocol during bolus administration of intravenous contrast. CONTRAST:  156mL ISOVUE-300 IOPAMIDOL (ISOVUE-300) INJECTION 61% COMPARISON:  None. FINDINGS: CT CHEST FINDINGS Cardiovascular: There is no evidence of thoracic aortic dissection or aneurysm. Normal cardiac size. No pericardial effusion is noted. Thrombosis of the superior vena cava is noted  which is nearly occlusive. Small amount of contrast is seen flowing into the right atrium. This results in multiple collateral veins. Probable thrombotic occlusion of left brachiocephalic vein is noted as well. Mediastinum/Nodes: Previously described SVC thrombosis and severe narrowing appears to be due to ill-defined soft tissue mass in right paratracheal and precarinal area measuring 3.2 x 2.3 cm, as well as right hilar mass or node measuring 2.2 x 1.7 cm. Thyroid gland is unremarkable. Esophagus is unremarkable. Lungs/Pleura: No pneumothorax or pleural effusion is noted. Emphysematous  disease is noted in both upper lobes. 2.1 x 2.0 cm irregular mass is noted medially in the right upper lobe concerning for malignancy. Also noted is spiculated density measuring 12 x 9 mm with pleural tail in right upper lobe concerning for possible malignancy. Musculoskeletal: No chest wall mass or suspicious bone lesions identified. CT ABDOMEN PELVIS FINDINGS Hepatobiliary: No focal liver abnormality is seen. No gallstones, gallbladder wall thickening, or biliary dilatation. Pancreas: Unremarkable. No pancreatic ductal dilatation or surrounding inflammatory changes. Spleen: Normal in size without focal abnormality. Adrenals/Urinary Tract: Adrenal glands are unremarkable. Kidneys are normal, without renal calculi, focal lesion, or hydronephrosis. Bladder is unremarkable. Stomach/Bowel: The stomach appears normal. The appendix is not visualized. There is no evidence of bowel obstruction or inflammation. Vascular/Lymphatic: Atherosclerosis of abdominal aorta is noted without aneurysm or dissection. IVC and iliac veins appear to be patent. However, collateral veins are noted in the anterior subcutaneous tissues of the abdomen. No significant adenopathy is noted in the abdomen or pelvis. Reproductive: Prostate is unremarkable. Other: No abdominal wall hernia or abnormality. No abdominopelvic ascites. Musculoskeletal: No acute or  significant osseous findings. IMPRESSION: 2.1 x 2.0 cm irregular mass is noted medially in right upper lobe concerning for malignancy. Also noted is probable right paratracheal, right hilar and precarinal adenopathy or malignancy, which results in severe narrowing and thrombosis of the superior vena cava. This results in multiple collateral veins in the chest and abdomen, as well as probable thrombosis of the left brachycephalic vein. This is consistent with SVC syndrome. PET scan is recommended for further evaluation. Aortic Atherosclerosis (ICD10-I70.0) and Emphysema (ICD10-J43.9). Electronically Signed   By: Marijo Conception, M.D.   On: 03/09/2018 15:19   Mr Jeri Cos HM Contrast  Result Date: 03/10/2018 CLINICAL DATA:  52 y/o  M; lung cancer for staging. EXAM: MRI HEAD WITHOUT AND WITH CONTRAST TECHNIQUE: Multiplanar, multiecho pulse sequences of the brain and surrounding structures were obtained without and with intravenous contrast. CONTRAST:  6 cc Gadavist COMPARISON:  03/09/2018 CT of chest, abdomen, and pelvis. FINDINGS: Brain: No acute infarction, hemorrhage, hydrocephalus, extra-axial collection or mass lesion. Few nonspecific T2 FLAIR hyperintensities in subcortical and periventricular white matter are compatible with mild chronic microvascular ischemic changes for age. No abnormal enhancement. Vascular: Normal flow voids. Skull and upper cervical spine: Normal marrow signal. Sinuses/Orbits: Moderate mucosal thickening of paranasal sinuses. No abnormal signal of mastoid air cells. Orbits are unremarkable. Other: None. IMPRESSION: 1. No intracranial metastatic disease identified. 2. Mild chronic microvascular ischemic changes of the brain. 3. Moderate paranasal sinus mucosal thickening. Electronically Signed   By: Kristine Garbe M.D.   On: 03/10/2018 03:58   Ct Abdomen Pelvis W Contrast  Result Date: 03/09/2018 CLINICAL DATA:  Facial and neck swelling for several months. Abdominal pain.  EXAM: CT CHEST, ABDOMEN, AND PELVIS WITH CONTRAST TECHNIQUE: Multidetector CT imaging of the chest, abdomen and pelvis was performed following the standard protocol during bolus administration of intravenous contrast. CONTRAST:  117mL ISOVUE-300 IOPAMIDOL (ISOVUE-300) INJECTION 61% COMPARISON:  None. FINDINGS: CT CHEST FINDINGS Cardiovascular: There is no evidence of thoracic aortic dissection or aneurysm. Normal cardiac size. No pericardial effusion is noted. 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 Small amount of contrast is seen flowing into the right atrium. This results in multiple collateral veins. Probable thrombotic occlusion of left brachiocephalic vein is noted as well. Mediastinum/Nodes: Previously described SVC thrombosis and severe narrowing appears to be due to ill-defined soft tissue mass in  right paratracheal and precarinal area measuring 3.2 x 2.3 cm, as well as right hilar mass or node measuring 2.2 x 1.7 cm. Thyroid gland is unremarkable. Esophagus is unremarkable. Lungs/Pleura: No pneumothorax or pleural effusion is noted. Emphysematous disease is noted in both upper lobes. 2.1 x 2.0 cm irregular mass is noted medially in the right upper lobe concerning for malignancy. Also noted is spiculated density measuring 12 x 9 mm with pleural tail in right upper lobe concerning for possible malignancy. Musculoskeletal: No chest wall mass or suspicious bone lesions identified. CT ABDOMEN PELVIS FINDINGS Hepatobiliary: No focal liver abnormality is seen. No gallstones, gallbladder wall thickening, or biliary dilatation. Pancreas: Unremarkable. No pancreatic ductal dilatation or surrounding inflammatory changes. Spleen: Normal in size without focal abnormality. Adrenals/Urinary Tract: Adrenal glands are unremarkable. Kidneys are normal, without renal calculi, focal lesion, or hydronephrosis. Bladder is unremarkable. Stomach/Bowel: The stomach appears normal. The appendix is  not visualized. There is no evidence of bowel obstruction or inflammation. Vascular/Lymphatic: Atherosclerosis of abdominal aorta is noted without aneurysm or dissection. IVC and iliac veins appear to be patent. However, collateral veins are noted in the anterior subcutaneous tissues of the abdomen. No significant adenopathy is noted in the abdomen or pelvis. Reproductive: Prostate is unremarkable. Other: No abdominal wall hernia or abnormality. No abdominopelvic ascites. Musculoskeletal: No acute or significant osseous findings. IMPRESSION: 2.1 x 2.0 cm irregular mass is noted medially in right upper lobe concerning for malignancy. Also noted is probable right paratracheal, right hilar and precarinal adenopathy or malignancy, which results in severe narrowing and thrombosis of the superior vena cava. This results in multiple collateral veins in the chest and abdomen, as well as probable thrombosis of the left brachycephalic vein. This is consistent with SVC syndrome. PET scan is recommended for further evaluation. Aortic Atherosclerosis (ICD10-I70.0) and Emphysema (ICD10-J43.9). Electronically Signed   By: Marijo Conception, M.D.   On: 03/09/2018 15:19   Nm Pet Image Initial (pi) Skull Base To Thigh  Result Date: 03/18/2018 CLINICAL DATA:  Initial treatment strategy for right upper lobe adenocarcinoma. EXAM: NUCLEAR MEDICINE PET SKULL BASE TO THIGH TECHNIQUE: 7.3 mCi F-18 FDG was injected intravenously. Full-ring PET imaging was performed from the skull base to thigh after the radiotracer. CT data was obtained and used for attenuation correction and anatomic localization. Fasting blood glucose: 117 mg/dl COMPARISON:  Multiple exams, including 03/09/2018 FINDINGS: Mediastinal blood pool activity: SUV max 1.9 NECK: No significant abnormal hypermetabolic activity in this region. Incidental CT findings: none CHEST: Medial nodule in the right lung apex measuring 1.6 by 2.8 cm on image 75/3 abuts the pleural margin  and has a maximum SUV of 6.1. No destruction of the adjacent ribs or vertebral body identified. Hypermetabolic confluent right paratracheal and right upper lobe hilar mass/adenopathy has a maximum SUV of 7.6, compatible with malignancy. A subcarinal node measuring approximately 1.3 cm in short axis on image 103/3 has a maximum SUV of 6.3, compatible with malignancy. Incidental CT findings: Mild mediastinal stranding and scattered small mediastinal lymph nodes are present. Some of this may relate to SVC narrowing/obstruction. Superior segment right upper lobe airway obstruction by tumor. ABDOMEN/PELVIS: No significant abnormal hypermetabolic activity in this region. Incidental CT findings: Right upper thigh Port-A-Cath noted with tip extending into the suprarenal IVC. Aortoiliac atherosclerotic vascular disease. Trace gas noted along the subcutaneous tissues of the right anterior abdominal wall possibly from injection. SKELETON: No significant abnormal hypermetabolic activity in this region. Incidental CT findings: none IMPRESSION: 1. Medial pleural-based  right apical lung nodule has a maximum SUV of 6.1, compatible with malignancy. 2. Hypermetabolic confluent right hilar/paratracheal adenopathy has a maximum SUV of 7.6, with tumor in this vicinity obstructing the superior segmental bronchus of the right upper lobe. 3. Hypermetabolic subcarinal lymph node SUV 6.3. 4. No findings of metastatic disease to the neck, abdomen/pelvis, or skeleton. 5. Stranding in the mediastinum, which may partially be due to narrowing/occlusion of the SVC. 6. Other imaging findings of potential clinical significance: Aortic Atherosclerosis (ICD10-I70.0). Right upper thigh Port-A-Cath with tip extending to the suprarenal IVC. Electronically Signed   By: Van Clines M.D.   On: 03/18/2018 13:31   Ct Biopsy  Result Date: 03/15/2018 INDICATION: 52 year old male smoker with multifocal pulmonary nodules and probable metastatic  mediastinal adenopathy resulting in SVC syndrome. He recently underwent a bronchoscopic biopsy which was nondiagnostic. He presents today for CT-guided biopsy to facilitate tissue diagnosis and allow for rapid treatment. EXAM: CT-guided biopsy right upper lobe pulmonary nodule Interventional Radiologist:  Criselda Peaches, MD MEDICATIONS: None. ANESTHESIA/SEDATION: Fentanyl 50 mcg IV; Versed 1.5 mg IV Moderate Sedation Time:  18 minutes The patient was continuously monitored during the procedure by the interventional radiology nurse under my direct supervision. FLUOROSCOPY TIME:  Fluoroscopy Time: 0 minutes 0 seconds (0 mGy). COMPLICATIONS: None immediate. Estimated blood loss:  0 PROCEDURE: Informed written consent was obtained from the patient after a thorough discussion of the procedural risks, benefits and alternatives. All questions were addressed. Maximal Sterile Barrier Technique was utilized including caps, mask, sterile gowns, sterile gloves, sterile drape, hand hygiene and skin antiseptic. A timeout was performed prior to the initiation of the procedure. A planning axial CT scan was performed. The nodule in the right upper lobe was successfully identified. A suitable skin entry site was selected and marked. The region was then sterilely prepped and draped in standard fashion using Betadine skin prep. Local anesthesia was attained by infiltration with 1% lidocaine. A small dermatotomy was made. Under intermittent CT fluoroscopic guidance, a 17 gauge trocar needle was advanced into the lung and positioned at the margin of the nodule. Multiple 18 gauge core biopsies were then coaxially obtained using the BioPince automated biopsy device. Biopsy specimens were placed in saline and delivered to pathology for further analysis. The biopsy device and introducer needle were removed. Post biopsy axial CT imaging demonstrates no evidence of immediate complication. There is no pneumothorax. The patient tolerated  the procedure well. IMPRESSION: Technically successful CT-guided biopsy medial right upper lobe pulmonary nodule. Electronically Signed   By: Jacqulynn Cadet M.D.   On: 03/15/2018 08:34   Dg Chest Port 1 View  Result Date: 03/12/2018 CLINICAL DATA:  Lung biopsy on 03/13/2018 EXAM: PORTABLE CHEST 1 VIEW COMPARISON:  03/09/2018 CT FINDINGS: No pneumothorax is identified. Emphysematous hyperinflation of the lungs is noted. The medial right upper lobe 2 cm nodular opacity as seen on prior chest CT is difficult to identify radiographically, likely from superimposition of pulmonary vasculature and mediastinum. No aggressive osseous lesions. Subchondral cyst of the glenoid on the right. IMPRESSION: No pneumothorax status post lung biopsy.  COPD. Electronically Signed   By: Ashley Royalty M.D.   On: 03/12/2018 18:29     Assessment and plan- Patient is a 52 y.o. male adenocarcinoma of the right lung stage IIIb CT 3 N2 M0 presenting as SVC syndrome.  He is here for on treatment assessment prior to cycle 2 of cisplatin and Alimta concurrently with radiation  Counts are okay to proceed with  cycle 2 of cisplatin and Alimta today.  His H&H is stable and his renal functions are stable 2.  His AST is mildly elevated at 53 which we will continue to monitor.  I will see him back in 3 weeks time for cycle #3 of cisplatin and Alimta.  I will plan to get repeat scans after his radiation is completed.  Plan is to complete 4 cycles of cisplatin and Alimta along with radiation followed by maintenance durvalumab.  He will continue Eliquis for his SVC thrombosis   Visit Diagnosis 1. 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 Malignant neoplasm of upper lobe of right lung [facility])      Dr. Randa Evens, MD, MPH [facility] at [facility] [phone] 04/08/2018 10:58 AM

## 2018-04-09 ENCOUNTER — Ambulatory Visit
Admission: RE | Admit: 2018-04-09 | Discharge: 2018-04-09 | Disposition: A | Discharge status: Home / Self Care | Payer: BLUE CROSS/BLUE SHIELD | Source: Ambulatory Visit | Attending: Radiation Oncology | Admitting: Radiation Oncology

## 2018-04-09 DIAGNOSIS — Z510 Encounter for antineoplastic radiation therapy: Secondary | ICD-10-CM | POA: Diagnosis not present

## 2018-04-12 NOTE — Telephone Encounter (Signed)
Oral Oncology Patient Advocate Encounter  03/18/18- Met patient in radiation waiting area to give him forms for Eliquis patient assistance.  Informed him to sign form and include his income information and mail to [employer] PA[employer].    I gave him my number if he had any questions.  [place] Patient [place] Phone [phone] Fax [phone]

## 2018-04-16 ENCOUNTER — Other Ambulatory Visit: Payer: Self-pay

## 2018-04-16 ENCOUNTER — Telehealth: Payer: Self-pay | Admitting: Pharmacy Technician

## 2018-04-16 ENCOUNTER — Telehealth: Payer: Self-pay | Admitting: *Deleted

## 2018-04-16 ENCOUNTER — Ambulatory Visit
Admission: RE | Admit: 2018-04-16 | Discharge: 2018-04-16 | Disposition: A | Discharge status: Home / Self Care | Payer: Self-pay | Source: Ambulatory Visit | Attending: Radiation Oncology | Admitting: Radiation Oncology

## 2018-04-16 ENCOUNTER — Encounter: Payer: Self-pay | Admitting: Radiation Oncology

## 2018-04-16 VITALS — BP 120/76 | HR 144 | Temp 98.4°F | Resp 18 | Wt 120.6 lb

## 2018-04-16 DIAGNOSIS — Z923 Personal history of irradiation: Secondary | ICD-10-CM | POA: Insufficient documentation

## 2018-04-16 DIAGNOSIS — C3411 Malignant neoplasm of upper lobe, right bronchus or lung: Secondary | ICD-10-CM | POA: Insufficient documentation

## 2018-04-16 MED ORDER — PANTOPRAZOLE SODIUM 20 MG PO TBEC: 20.0000 mg | DELAYED_RELEASE_TABLET | Freq: Every day | ORAL | 2 refills | Status: DC

## 2018-04-16 NOTE — Progress Notes (Signed)
Radiation Oncology Follow up Note  Name: Alexander [name]   Date:   04/16/2018 MRN:  [id number] DOB: 11-14-1965    This 52 y.o. male presents to the clinic today for reevaluation status post initial course of radiation therapy for patient with known.adenocarcinoma of the lung stage IIIB  REFERRING PROVIDER: No ref. 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 He presented with an SVC. He is seen today after completion of prostate 4000 cGy over 4 weeks with concurrent chemotherapy. He is doing well still has some moderate venous jugular distention in his neck. He specifically denies hemoptysis chest tightness does have a slight productive cough. Also has still some mild dysphagia..  COMPLICATIONS OF TREATMENT: none  FOLLOW UP COMPLIANCE: keeps appointments   PHYSICAL EXAM:  BP 120/76 (BP Location: Left Arm, Patient Position: Sitting)   Pulse (!) 144   Temp 98.4 F (36.9 C) (Tympanic)   Resp 18   Wt 120 lb 9.5 oz (54.7 kg)   BMI 17.81 kg/m  Thin slightly cachectic male with obvious venous jugular distention.Well-developed well-nourished patient in NAD. HEENT reveals PERLA, EOMI, discs not visualized.  Oral cavity is clear. No oral mucosal lesions are identified. Neck is clear without evidence of cervical or supraclavicular adenopathy. Lungs are clear to A&P. Cardiac examination is essentially unremarkable with regular rate and rhythm without murmur rub or thrill. Abdomen is benign with no organomegaly or masses noted. Motor sensory and DTR levels are equal and symmetric in the upper and lower extremities. Cranial nerves II through XII are grossly intact. Proprioception is intact. No peripheral adenopathy or edema is identified. No motor or sensory levels are noted. Crude visual fields are within normal range.  RADIOLOGY RESULTS: no current films for review  PLAN: present time I'm going to recently the patient for  possible small field boost. Should I see response would go ahead with probable another 3000 cGy over 3 weeks to his chest. I will discuss with Dr. Janese Banks possible continuation of concurrent chemotherapy with radiation. I personally 7 ordered repeat CT simulation. Patient comprehends my treatment plan well.  I would like to take this opportunity to thank you for allowing me to participate in the care of your patient.Noreene Filbert, MD

## 2018-04-16 NOTE — Telephone Encounter (Signed)
Pt having increased heartburn and acid reflux. Requests prescription to be called in to help if possible. Per Dr. Janese Banks, may send in prescription for protonix 20mg  daily. Rx has been escribed to [facility].

## 2018-04-20 ENCOUNTER — Ambulatory Visit
Admission: RE | Admit: 2018-04-20 | Discharge: 2018-04-20 | Disposition: A | Discharge status: Home / Self Care | Payer: BLUE CROSS/BLUE SHIELD | Source: Ambulatory Visit | Attending: Radiation Oncology | Admitting: Radiation Oncology

## 2018-04-20 ENCOUNTER — Telehealth: Payer: Self-pay | Admitting: *Deleted

## 2018-04-20 DIAGNOSIS — Z510 Encounter for antineoplastic radiation therapy: Secondary | ICD-10-CM | POA: Diagnosis not present

## 2018-04-20 MED ORDER — PREDNISONE 10 MG PO TABS: 10.0000 mg | ORAL_TABLET | Freq: Every day | ORAL | 0 refills | Status: DC

## 2018-04-20 NOTE — Telephone Encounter (Signed)
Pt's friend, Hilda Blades, called in to report that pt has not been eating well the past few days and was wondering if anything could help with that. Per Dr. Janese Banks, pt can try low dose steroid for 1 week to see if helps with appetite. Prednisone 10mg  daily x 7 days has been sent into [facility]. Pt is in agreement with plan and has been made aware prescription was sent in.

## 2018-04-26 DIAGNOSIS — Z510 Encounter for antineoplastic radiation therapy: Secondary | ICD-10-CM | POA: Insufficient documentation

## 2018-04-26 DIAGNOSIS — C3491 Malignant neoplasm of unspecified part of right bronchus or lung: Secondary | ICD-10-CM | POA: Diagnosis present

## 2018-04-29 ENCOUNTER — Encounter: Payer: Self-pay | Admitting: Pharmacy Technician

## 2018-04-29 ENCOUNTER — Inpatient Hospital Stay: Payer: BLUE CROSS/BLUE SHIELD

## 2018-04-29 ENCOUNTER — Other Ambulatory Visit: Payer: Self-pay

## 2018-04-29 ENCOUNTER — Inpatient Hospital Stay (HOSPITAL_BASED_OUTPATIENT_CLINIC_OR_DEPARTMENT_OTHER): Payer: BLUE CROSS/BLUE SHIELD | Admitting: Oncology

## 2018-04-29 ENCOUNTER — Inpatient Hospital Stay: Payer: BLUE CROSS/BLUE SHIELD | Attending: Oncology

## 2018-04-29 ENCOUNTER — Ambulatory Visit
Admission: RE | Admit: 2018-04-29 | Discharge: 2018-04-29 | Disposition: A | Discharge status: Home / Self Care | Payer: BLUE CROSS/BLUE SHIELD | Source: Ambulatory Visit | Attending: Radiation Oncology | Admitting: Radiation Oncology

## 2018-04-29 ENCOUNTER — Encounter: Payer: Self-pay | Admitting: Oncology

## 2018-04-29 VITALS — BP 114/77 | HR 101 | Temp 97.3°F | Resp 18 | Wt 128.9 lb

## 2018-04-29 DIAGNOSIS — Z5111 Encounter for antineoplastic chemotherapy: Secondary | ICD-10-CM | POA: Diagnosis present

## 2018-04-29 DIAGNOSIS — Z510 Encounter for antineoplastic radiation therapy: Secondary | ICD-10-CM | POA: Diagnosis not present

## 2018-04-29 DIAGNOSIS — I82210 Acute embolism and thrombosis of superior vena cava: Secondary | ICD-10-CM | POA: Insufficient documentation

## 2018-04-29 DIAGNOSIS — Z7901 Long term (current) use of anticoagulants: Secondary | ICD-10-CM

## 2018-04-29 DIAGNOSIS — C3411 Malignant neoplasm of upper lobe, right bronchus or lung: Secondary | ICD-10-CM

## 2018-04-29 DIAGNOSIS — D6481 Anemia due to antineoplastic chemotherapy: Secondary | ICD-10-CM

## 2018-04-29 DIAGNOSIS — Z95828 Presence of other vascular implants and grafts: Secondary | ICD-10-CM

## 2018-04-29 DIAGNOSIS — T451X5A Adverse effect of antineoplastic and immunosuppressive drugs, initial encounter: Secondary | ICD-10-CM

## 2018-04-29 LAB — COMPREHENSIVE METABOLIC PANEL
ALBUMIN: 3.3 g/dL — AB (ref 3.5–5.0)
ALK PHOS: 100 U/L (ref 38–126)
ALT: 18 U/L (ref 0–44)
AST: 17 U/L (ref 15–41)
Anion gap: 9 (ref 5–15)
BUN: 26 mg/dL — ABNORMAL HIGH (ref 6–20)
CO2: 27 mmol/L (ref 22–32)
Calcium: 8.7 mg/dL — ABNORMAL LOW (ref 8.9–10.3)
Chloride: 102 mmol/L (ref 98–111)
Creatinine, Ser: 1.19 mg/dL (ref 0.61–1.24)
GFR calc non Af Amer: >60 mL/min (ref >60)
GLUCOSE: 213 mg/dL — AB (ref 70–99)
Potassium: 3.8 mmol/L (ref 3.5–5.1)
SODIUM: 138 mmol/L (ref 135–145)
TOTAL PROTEIN: 6.2 g/dL — AB (ref 6.5–8.1)
Total Bilirubin: 0.4 mg/dL (ref 0.3–1.2)

## 2018-04-29 LAB — CBC WITH DIFFERENTIAL/PLATELET
ABS IMMATURE GRANULOCYTES: 0.24 10*3/uL — AB (ref 0.00–0.07)
BASOS ABS: 0 10*3/uL (ref 0.0–0.1)
BASOS PCT: 0 %
Eosinophils Absolute: 0 10*3/uL (ref 0.0–0.5)
Eosinophils Relative: 0 %
HCT: 33 % — ABNORMAL LOW (ref 39.0–52.0)
HEMOGLOBIN: 10.7 g/dL — AB (ref 13.0–17.0)
IMMATURE GRANULOCYTES: 3 %
LYMPHS PCT: 4 %
Lymphs Abs: 0.4 10*3/uL — ABNORMAL LOW (ref 0.7–4.0)
MCH: 29.3 pg (ref 26.0–34.0)
MCHC: 32.4 g/dL (ref 30.0–36.0)
MCV: 90.4 fL (ref 80.0–100.0)
Monocytes Absolute: 0.3 10*3/uL (ref 0.1–1.0)
Monocytes Relative: 3 %
NEUTROS ABS: 8.4 10*3/uL — AB (ref 1.7–7.7)
NEUTROS PCT: 90 %
NRBC: 0 % (ref 0.0–0.2)
PLATELETS: 319 10*3/uL (ref 150–400)
RBC: 3.65 MIL/uL — AB (ref 4.22–5.81)
RDW: 15.1 % (ref 11.5–15.5)
WBC: 9.3 10*3/uL (ref 4.0–10.5)

## 2018-04-29 MED ORDER — SODIUM CHLORIDE 0.9% FLUSH
10.0000 mL | Freq: Once | INTRAVENOUS | Status: DC
  Filled 2018-04-29: qty 10

## 2018-04-29 MED ORDER — POTASSIUM CHLORIDE 2 MEQ/ML IV SOLN
Freq: Once | INTRAVENOUS | Status: AC
  Administered 2018-04-29: 10:00:00 via INTRAVENOUS
  Filled 2018-04-29: qty 1000

## 2018-04-29 MED ORDER — CYANOCOBALAMIN 1000 MCG/ML IJ SOLN
1000.0000 ug | Freq: Once | INTRAMUSCULAR | Status: AC
  Administered 2018-04-29: 1000 ug via INTRAMUSCULAR
  Filled 2018-04-29: qty 1

## 2018-04-29 MED ORDER — SODIUM CHLORIDE 0.9 % IV SOLN
900.0000 mg | Freq: Once | INTRAVENOUS | Status: AC
  Administered 2018-04-29: 900 mg via INTRAVENOUS
  Filled 2018-04-29: qty 20

## 2018-04-29 MED ORDER — HEPARIN SOD (PORK) LOCK FLUSH 100 UNIT/ML IV SOLN
500.0000 [IU] | Freq: Once | INTRAVENOUS | Status: AC
  Administered 2018-04-29: 500 [IU] via INTRAVENOUS
  Filled 2018-04-29: qty 5

## 2018-04-29 MED ORDER — SODIUM CHLORIDE 0.9 % IV SOLN
Freq: Once | INTRAVENOUS | Status: AC
  Administered 2018-04-29: 10:00:00 via INTRAVENOUS
  Filled 2018-04-29: qty 250

## 2018-04-29 MED ORDER — SODIUM CHLORIDE 0.9 % IV SOLN
75.0000 mg/m2 | Freq: Once | INTRAVENOUS | Status: AC
  Administered 2018-04-29: 131 mg via INTRAVENOUS
  Filled 2018-04-29: qty 100

## 2018-04-29 MED ORDER — SODIUM CHLORIDE 0.9 % IV SOLN
Freq: Once | INTRAVENOUS | Status: AC
  Administered 2018-04-29: 13:00:00 via INTRAVENOUS
  Filled 2018-04-29: qty 5

## 2018-04-29 MED ORDER — PALONOSETRON HCL INJECTION 0.25 MG/5ML
0.2500 mg | Freq: Once | INTRAVENOUS | Status: AC
  Administered 2018-04-29: 0.25 mg via INTRAVENOUS
  Filled 2018-04-29: qty 5

## 2018-04-29 NOTE — Progress Notes (Signed)
Here for follow up. Per pt appetite improved -( while on prednisone -completed now ) ,no pain c/o. Intermittent nausea and per pt- antiemetic helps.

## 2018-04-29 NOTE — Addendum Note (Signed)
Addended by: Waymon Budge on: 04/29/2018 02:26 PM   Modules accepted: Orders

## 2018-04-29 NOTE — Progress Notes (Signed)
Hematology/Oncology Consult note [facility]  [phone] [phone]  Patient Care Team: Patient, No Pcp Per as PCP - General ([place]) Telford Nab, RN as Registered Nurse   Name of the patient: Alexander [name]  [id number]  05/29/1965   Date of visit: 04/29/18  Diagnosis- lung adenocarcinoma Stage IIIB cT3 cN2 cM0  Chief complaint/ Reason for visit-on treatment assessment prior to cycle 3 of cisplatin and Alimta  Heme/Onc history:  patient is a 52 year old male with a long-standing history of smoking. He has smoked 1 to 1/2 pack of cigarettes per day for over 30 years. He presented to outside urgent care with some symptoms of shortness of breath and was treated for URI. He subsequently presented with facial and neck swelling which was thought by outside ER to be secondary to a drug reaction. Following that patient came to ER here at [facility]  He underwent CT chest abdomen and pelvis which showed 2.1 x 2 cm irregular mass in the right upper lobe concerning for malignancy.. Multiple collateral veins in chest and abdomen probable thrombosis of the left brachiocephalic vein. This is consistent with IVC syndrome Probable right paratracheal right hilar and precarinal adenopathy resulting in severe narrowing and thrombosis of SVC. This is consistent with SVC syndrome  Patient was seen by vascular surgery and there was no acute need for any endovascular therapies. He is on anticoagulation with Coumadin which has been subsequently changed to Eliquis as an outpatient. Patient initially underwent a bronchoscopy guided biopsy of the mediastinal lymph nodes which was inconclusive and underwent a second IR guided biopsy of the right upper lobe lung mass which was consistent with adenocarcinoma  Patient started radiation treatment on 03/15/2018. Plan is to give 4 cycles of cisplatin and Alimta followed by maintenance durvalumab. Cycle 1  of cisplatin Alimta given on 03/16/2018  Interval history-he felt better after receiving 1 week of steroids for his appetite and fatigue.  He states that his weight was down to 121 pounds when checked at the radiation office and is up to 128 pounds today.  He is trying to eat but typically even before the diagnosis of lung cancer he is used to eating only one meal a day.  He admits to not being regular with his ensure intake.  Denies any significant nausea or vomiting.  Denies any difficulty swallowing.  ECOG PS- 1 Pain scale- 0 Opioid associated constipation- no  Review of systems- Review of Systems  Constitutional: Positive for malaise/fatigue and weight loss. Negative for chills and fever.  HENT: Negative for congestion, ear discharge and nosebleeds.   Eyes: Negative for blurred vision.  Respiratory: Negative for cough, hemoptysis, sputum production, shortness of breath and wheezing.   Cardiovascular: Negative for chest pain, palpitations, orthopnea and claudication.  Gastrointestinal: Negative for abdominal pain, blood in stool, constipation, diarrhea, heartburn, melena, nausea and vomiting.  Genitourinary: Negative for dysuria, flank pain, frequency, hematuria and urgency.  Musculoskeletal: Negative for back pain, joint pain and myalgias.  Skin: Negative for rash.  Neurological: Negative for dizziness, tingling, focal weakness, seizures, weakness and headaches.  Endo/Heme/Allergies: Does not bruise/bleed easily.  Psychiatric/Behavioral: Negative for depression and suicidal ideas. The patient does not have insomnia.      Allergies  Allergen Reactions  . Doxycycline Swelling  . Penicillins      Past Medical History:  Diagnosis Date  . COPD (chronic obstructive pulmonary disease) ([place])   . Lung mass      Past Surgical History:  Procedure Laterality Date  . ENDOBRONCHIAL ULTRASOUND N/A 03/11/2018   Procedure: ENDOBRONCHIAL ULTRASOUND;  Surgeon: Tyler Pita, MD;   Location: [facility] ORS;  Service: Cardiopulmonary;  Laterality: N/A;  . none    . PORTA CATH INSERTION N/A 03/12/2018   Procedure: PORTA CATH INSERTION, thigh;  Surgeon: Katha Cabal, MD;  Location: [place] [facility];  Service: Cardiovascular;  Laterality: N/A;    Social History   Socioeconomic History  . Marital status: Divorced    Spouse name: Not on file  . Number of children: Not on file  . Years of education: Not on file  . Highest education level: Not on file  Occupational History  . Occupation: Cabin crew  Social Needs  . Financial resource strain: Not on file  . Food insecurity:    Worry: Not on file    Inability: Not on file  . Transportation needs:    Medical: Not on file    Non-medical: Not on file  Tobacco Use  . Smoking status: Current Every Day Smoker  . Smokeless tobacco: Never Used  Substance and Sexual Activity  . Alcohol use: Not Currently  . Drug use: Not Currently  . Sexual activity: Yes  Lifestyle  . Physical activity:    Days per week: Not on file    Minutes per session: Not on file  . Stress: Not on file  Relationships  . Social connections:    Talks on phone: Not on file    Gets together: Not on file    Attends religious service: Not on file    Active member of club or organization: Not on file    Attends meetings of clubs or organizations: Not on file    Relationship status: Not on file  . Intimate partner violence:    Fear of current or ex partner: Not on file    Emotionally abused: Not on file    Physically abused: Not on file    Forced sexual activity: Not on file  Other Topics Concern  . Not on file  Social History Narrative  . Not on file    No family history on file.   Current Outpatient Medications:  .  apixaban (ELIQUIS) 5 MG TABS tablet, Take 1 tablet (5 mg total) by mouth 2 (two) times daily., Disp: 60 tablet, Rfl: 3 .  dexamethasone (DECADRON) 4 MG tablet, Take 1 tab two times a day the day before Alimta chemo.  Take 2 tabs the day after chemo, then take 2 tabs two times a day for 2 days., Disp: 30 tablet, Rfl: 1 .  folic acid (FOLVITE) 1 MG tablet, Take 1 tablet (1 mg total) by mouth daily. Start 5-7 days before Alimta chemotherapy. Continue until 21 days after Alimta completed., Disp: 100 tablet, Rfl: 3 .  lidocaine-prilocaine (EMLA) cream, Apply to affected area once, Disp: 30 g, Rfl: 3 .  ondansetron (ZOFRAN) 8 MG tablet, Take 1 tablet (8 mg total) by mouth 2 (two) times daily as needed (Nausea or vomiting). Start if needed on the third day after chemotherapy., Disp: 30 tablet, Rfl: 1 .  pantoprazole (PROTONIX) 20 MG tablet, Take 1 tablet (20 mg total) by mouth daily., Disp: 30 tablet, Rfl: 2 .  chlorproMAZINE (THORAZINE) 25 MG tablet, Take 1 tablet (25 mg total) by mouth every 6 (six) hours as needed (hiccups). (Patient not taking: Reported on 04/29/2018), Disp: 30 tablet, Rfl: 1 .  oxyCODONE-acetaminophen (PERCOCET/ROXICET) 5-325 MG tablet, Take 1-2 tablets by mouth every 4 (  four) hours as needed for moderate pain. (Patient not taking: Reported on 04/29/2018), Disp: 30 tablet, Rfl: 0 .  predniSONE (DELTASONE) 10 MG tablet, Take 1 tablet (10 mg total) by mouth daily with breakfast. (Patient not taking: Reported on 04/29/2018), Disp: 7 tablet, Rfl: 0 No current facility-administered medications for this visit.   Facility-Administered Medications Ordered in Other Visits:  .  heparin lock flush 100 unit/mL, 500 Units, Intravenous, Once, Sindy Guadeloupe, MD .  sodium chloride flush (NS) 0.9 % injection 10 mL, 10 mL, Intravenous, Once, Sindy Guadeloupe, MD  Physical exam:  Vitals:   04/29/18 0920  BP: 114/77  Pulse: (!) 101  Resp: 18  Temp: (!) 97.3 F (36.3 C)  TempSrc: Tympanic  Weight: 128 lb 14.4 oz (58.5 kg)   Physical Exam  Constitutional: He is oriented to person, place, and time.  He appears thinner than before.  No acute distress.  Neck veins still appear a little distended  HENT:  Head:  Normocephalic and atraumatic.  Eyes: Pupils are equal, round, and reactive to light. EOM are normal.  Neck: Normal range of motion.  Cardiovascular: Normal rate, regular rhythm and normal heart sounds.  Pulmonary/Chest: Effort normal and breath sounds normal.  Abdominal: Soft. Bowel sounds are normal.  Neurological: He is alert and oriented to person, place, and time.  Skin: Skin is warm and dry.     CMP Latest Ref Rng & Units 04/29/2018  Glucose 70 - 99 mg/dL 213(H)  BUN 6 - 20 mg/dL 26(H)  Creatinine 0.61 - 1.24 mg/dL 1.19  Sodium 135 - 145 mmol/L 138  Potassium 3.5 - 5.1 mmol/L 3.8  Chloride 98 - 111 mmol/L 102  CO2 22 - 32 mmol/L 27  Calcium 8.9 - 10.3 mg/dL 8.7(L)  Total Protein 6.5 - 8.1 g/dL 6.2(L)  Total Bilirubin 0.3 - 1.2 mg/dL 0.4  Alkaline Phos 38 - 126 U/L 100  AST 15 - 41 U/L 17  ALT 0 - 44 U/L 18   CBC Latest Ref Rng & Units 04/29/2018  WBC 4.0 - 10.5 K/uL 9.3  Hemoglobin 13.0 - 17.0 g/dL 10.7(L)  Hematocrit 39.0 - 52.0 % 33.0(L)  Platelets 150 - 400 K/uL 319      Assessment and plan- Patient is a 52 y.o. male adenocarcinoma of the right lung stage IIIb CT 3 N2 M0 presenting as SVC syndrome.  He is here for on treatment assessment prior to cycle 3 of cisplatin and Alimta concurrent with radiation.   Counts okay to proceed with cycle 3 of cisplatin and Alimta today.  His renal functions are stable.  He does have chemo induced anemia which we will continue to monitor.  I will hold off on prescribing Megace at this time and see how he does with his calorie intake and weight since he just received a week of steroids and is symptomatically feeling better.  I will see him back in 3 weeks time with CBC CMP and magnesium prior to cycle 4 of cisplatin and Alimta which would be his last cycle of chemo.  He finishes radiation on 05/24/2018.  I will plan to get repeat CT chest abdomen and pelvis upon completion of radiation treatment.  He will continue Eliquis for his SVC  thrombosis   Visit Diagnosis 1. Encounter for antineoplastic chemotherapy   2. Malignant neoplasm of upper lobe of right lung ([place])   3. Antineoplastic chemotherapy induced anemia      Dr. Randa Evens, MD, MPH [place] at [facility]  [facility] [phone] 04/29/2018 9:46 AM

## 2018-05-03 ENCOUNTER — Ambulatory Visit
Admission: RE | Admit: 2018-05-03 | Discharge: 2018-05-03 | Disposition: A | Discharge status: Home / Self Care | Payer: BLUE CROSS/BLUE SHIELD | Source: Ambulatory Visit | Attending: Radiation Oncology | Admitting: Radiation Oncology

## 2018-05-03 DIAGNOSIS — Z510 Encounter for antineoplastic radiation therapy: Secondary | ICD-10-CM | POA: Diagnosis not present

## 2018-05-04 ENCOUNTER — Ambulatory Visit
Admission: RE | Admit: 2018-05-04 | Discharge: 2018-05-04 | Disposition: A | Discharge status: Home / Self Care | Payer: BLUE CROSS/BLUE SHIELD | Source: Ambulatory Visit | Attending: Radiation Oncology | Admitting: Radiation Oncology

## 2018-05-04 DIAGNOSIS — Z510 Encounter for antineoplastic radiation therapy: Secondary | ICD-10-CM | POA: Diagnosis not present

## 2018-05-05 ENCOUNTER — Ambulatory Visit
Admission: RE | Admit: 2018-05-05 | Discharge: 2018-05-05 | Disposition: A | Discharge status: Home / Self Care | Payer: BLUE CROSS/BLUE SHIELD | Source: Ambulatory Visit | Attending: Radiation Oncology | Admitting: Radiation Oncology

## 2018-05-05 ENCOUNTER — Ambulatory Visit: Payer: BLUE CROSS/BLUE SHIELD

## 2018-05-05 DIAGNOSIS — Z510 Encounter for antineoplastic radiation therapy: Secondary | ICD-10-CM | POA: Diagnosis not present

## 2018-05-06 ENCOUNTER — Ambulatory Visit: Payer: BLUE CROSS/BLUE SHIELD

## 2018-05-06 ENCOUNTER — Ambulatory Visit
Admission: RE | Admit: 2018-05-06 | Discharge: 2018-05-06 | Disposition: A | Discharge status: Home / Self Care | Payer: BLUE CROSS/BLUE SHIELD | Source: Ambulatory Visit | Attending: Radiation Oncology | Admitting: Radiation Oncology

## 2018-05-06 DIAGNOSIS — Z510 Encounter for antineoplastic radiation therapy: Secondary | ICD-10-CM | POA: Diagnosis not present

## 2018-05-07 ENCOUNTER — Ambulatory Visit
Admission: RE | Admit: 2018-05-07 | Discharge: 2018-05-07 | Disposition: A | Discharge status: Home / Self Care | Payer: BLUE CROSS/BLUE SHIELD | Source: Ambulatory Visit | Attending: Radiation Oncology | Admitting: Radiation Oncology

## 2018-05-07 DIAGNOSIS — Z510 Encounter for antineoplastic radiation therapy: Secondary | ICD-10-CM | POA: Diagnosis not present

## 2018-05-07 NOTE — Telephone Encounter (Signed)
Oral Oncology Patient Advocate Encounter   Met patient in lobby to obtain tax information and sign application for assistance to receive Eliquis through the manufacturer.  Faxed application for assistance to [employer] on 04/16/18.   Fax# [phone].     Phone# [phone]  Patient was approved for assistance from 04/19/18-05/25/18.  Patient received first shipment on 04/27/18.  [place] Patient [place] Phone [phone] Fax [phone]

## 2018-05-10 ENCOUNTER — Ambulatory Visit
Admission: RE | Admit: 2018-05-10 | Discharge: 2018-05-10 | Disposition: A | Discharge status: Home / Self Care | Payer: BLUE CROSS/BLUE SHIELD | Source: Ambulatory Visit | Attending: Radiation Oncology | Admitting: Radiation Oncology

## 2018-05-10 DIAGNOSIS — Z510 Encounter for antineoplastic radiation therapy: Secondary | ICD-10-CM | POA: Diagnosis not present

## 2018-05-11 ENCOUNTER — Ambulatory Visit
Admission: RE | Admit: 2018-05-11 | Discharge: 2018-05-11 | Disposition: A | Discharge status: Home / Self Care | Payer: BLUE CROSS/BLUE SHIELD | Source: Ambulatory Visit | Attending: Radiation Oncology | Admitting: Radiation Oncology

## 2018-05-11 DIAGNOSIS — Z510 Encounter for antineoplastic radiation therapy: Secondary | ICD-10-CM | POA: Diagnosis not present

## 2018-05-12 ENCOUNTER — Ambulatory Visit
Admission: RE | Admit: 2018-05-12 | Discharge: 2018-05-12 | Disposition: A | Discharge status: Home / Self Care | Payer: BLUE CROSS/BLUE SHIELD | Source: Ambulatory Visit | Attending: Radiation Oncology | Admitting: Radiation Oncology

## 2018-05-12 DIAGNOSIS — Z510 Encounter for antineoplastic radiation therapy: Secondary | ICD-10-CM | POA: Diagnosis not present

## 2018-05-13 ENCOUNTER — Ambulatory Visit
Admission: RE | Admit: 2018-05-13 | Discharge: 2018-05-13 | Disposition: A | Discharge status: Home / Self Care | Payer: BLUE CROSS/BLUE SHIELD | Source: Ambulatory Visit | Attending: Radiation Oncology | Admitting: Radiation Oncology

## 2018-05-13 DIAGNOSIS — Z510 Encounter for antineoplastic radiation therapy: Secondary | ICD-10-CM | POA: Diagnosis not present

## 2018-05-14 ENCOUNTER — Ambulatory Visit
Admission: RE | Admit: 2018-05-14 | Discharge: 2018-05-14 | Disposition: A | Discharge status: Home / Self Care | Payer: BLUE CROSS/BLUE SHIELD | Source: Ambulatory Visit | Attending: Radiation Oncology | Admitting: Radiation Oncology

## 2018-05-14 ENCOUNTER — Ambulatory Visit: Payer: Self-pay | Admitting: Radiation Oncology

## 2018-05-14 DIAGNOSIS — Z510 Encounter for antineoplastic radiation therapy: Secondary | ICD-10-CM | POA: Diagnosis not present

## 2018-05-17 ENCOUNTER — Ambulatory Visit
Admission: RE | Admit: 2018-05-17 | Discharge: 2018-05-17 | Disposition: A | Discharge status: Home / Self Care | Payer: BLUE CROSS/BLUE SHIELD | Source: Ambulatory Visit | Attending: Radiation Oncology | Admitting: Radiation Oncology

## 2018-05-17 DIAGNOSIS — Z510 Encounter for antineoplastic radiation therapy: Secondary | ICD-10-CM | POA: Diagnosis not present

## 2018-05-18 ENCOUNTER — Ambulatory Visit
Admission: RE | Admit: 2018-05-18 | Discharge: 2018-05-18 | Disposition: A | Discharge status: Home / Self Care | Payer: BLUE CROSS/BLUE SHIELD | Source: Ambulatory Visit | Attending: Radiation Oncology | Admitting: Radiation Oncology

## 2018-05-18 DIAGNOSIS — Z510 Encounter for antineoplastic radiation therapy: Secondary | ICD-10-CM | POA: Diagnosis not present

## 2018-05-20 ENCOUNTER — Other Ambulatory Visit: Payer: Self-pay | Admitting: *Deleted

## 2018-05-20 ENCOUNTER — Inpatient Hospital Stay: Payer: BLUE CROSS/BLUE SHIELD

## 2018-05-20 ENCOUNTER — Ambulatory Visit
Admission: RE | Admit: 2018-05-20 | Discharge: 2018-05-20 | Disposition: A | Discharge status: Home / Self Care | Payer: BLUE CROSS/BLUE SHIELD | Source: Ambulatory Visit | Attending: Radiation Oncology | Admitting: Radiation Oncology

## 2018-05-20 ENCOUNTER — Encounter: Payer: Self-pay | Admitting: Pharmacy Technician

## 2018-05-20 ENCOUNTER — Encounter: Payer: Self-pay | Admitting: Oncology

## 2018-05-20 ENCOUNTER — Inpatient Hospital Stay (HOSPITAL_BASED_OUTPATIENT_CLINIC_OR_DEPARTMENT_OTHER): Payer: BLUE CROSS/BLUE SHIELD | Admitting: Oncology

## 2018-05-20 VITALS — BP 119/84 | HR 112 | Temp 97.0°F | Resp 18 | Wt 127.6 lb

## 2018-05-20 DIAGNOSIS — Z7901 Long term (current) use of anticoagulants: Secondary | ICD-10-CM

## 2018-05-20 DIAGNOSIS — Z510 Encounter for antineoplastic radiation therapy: Secondary | ICD-10-CM | POA: Diagnosis not present

## 2018-05-20 DIAGNOSIS — T451X5A Adverse effect of antineoplastic and immunosuppressive drugs, initial encounter: Secondary | ICD-10-CM

## 2018-05-20 DIAGNOSIS — D649 Anemia, unspecified: Secondary | ICD-10-CM

## 2018-05-20 DIAGNOSIS — Z5111 Encounter for antineoplastic chemotherapy: Secondary | ICD-10-CM | POA: Diagnosis not present

## 2018-05-20 DIAGNOSIS — I82210 Acute embolism and thrombosis of superior vena cava: Secondary | ICD-10-CM

## 2018-05-20 DIAGNOSIS — C3411 Malignant neoplasm of upper lobe, right bronchus or lung: Secondary | ICD-10-CM

## 2018-05-20 DIAGNOSIS — D6481 Anemia due to antineoplastic chemotherapy: Secondary | ICD-10-CM

## 2018-05-20 LAB — CBC WITH DIFFERENTIAL/PLATELET
Abs Immature Granulocytes: 0.07 10*3/uL (ref 0.00–0.07)
Basophils Absolute: 0 10*3/uL (ref 0.0–0.1)
Basophils Relative: 0 %
EOS ABS: 0 10*3/uL (ref 0.0–0.5)
EOS PCT: 0 %
HCT: 34.9 % — ABNORMAL LOW (ref 39.0–52.0)
Hemoglobin: 11.1 g/dL — ABNORMAL LOW (ref 13.0–17.0)
Immature Granulocytes: 1 %
Lymphocytes Relative: 7 %
Lymphs Abs: 0.4 10*3/uL — ABNORMAL LOW (ref 0.7–4.0)
MCH: 28.9 pg (ref 26.0–34.0)
MCHC: 31.8 g/dL (ref 30.0–36.0)
MCV: 90.9 fL (ref 80.0–100.0)
MONO ABS: 0.5 10*3/uL (ref 0.1–1.0)
Monocytes Relative: 9 %
Neutro Abs: 4.8 10*3/uL (ref 1.7–7.7)
Neutrophils Relative %: 83 %
Platelets: 319 10*3/uL (ref 150–400)
RBC: 3.84 MIL/uL — ABNORMAL LOW (ref 4.22–5.81)
RDW: 15.9 % — ABNORMAL HIGH (ref 11.5–15.5)
WBC: 5.8 10*3/uL (ref 4.0–10.5)
nRBC: 0 % (ref 0.0–0.2)

## 2018-05-20 LAB — MAGNESIUM: Magnesium: 1.7 mg/dL (ref 1.7–2.4)

## 2018-05-20 LAB — COMPREHENSIVE METABOLIC PANEL
ALT: 11 U/L (ref 0–44)
AST: 28 U/L (ref 15–41)
Albumin: 3.5 g/dL (ref 3.5–5.0)
Alkaline Phosphatase: 82 U/L (ref 38–126)
Anion gap: 13 (ref 5–15)
BILIRUBIN TOTAL: 0.5 mg/dL (ref 0.3–1.2)
BUN: 16 mg/dL (ref 6–20)
CO2: 23 mmol/L (ref 22–32)
CREATININE: 1.14 mg/dL (ref 0.61–1.24)
Calcium: 9.2 mg/dL (ref 8.9–10.3)
Chloride: 101 mmol/L (ref 98–111)
GFR calc Af Amer: >60 mL/min (ref >60)
GFR calc non Af Amer: >60 mL/min (ref >60)
Glucose, Bld: 211 mg/dL — ABNORMAL HIGH (ref 70–99)
Potassium: 3.9 mmol/L (ref 3.5–5.1)
Sodium: 137 mmol/L (ref 135–145)
Total Protein: 6.8 g/dL (ref 6.5–8.1)

## 2018-05-20 LAB — IRON AND TIBC
Iron: 67 ug/dL (ref 45–182)
Saturation Ratios: 22 % (ref 17.9–39.5)
TIBC: 301 ug/dL (ref 250–450)
UIBC: 234 ug/dL

## 2018-05-20 LAB — FERRITIN: Ferritin: 422 ng/mL — ABNORMAL HIGH (ref 24–336)

## 2018-05-20 LAB — VITAMIN B12: VITAMIN B 12: 447 pg/mL (ref 180–914)

## 2018-05-20 LAB — FOLATE: Folate: 48 ng/mL (ref >5.9)

## 2018-05-20 MED ORDER — SODIUM CHLORIDE 0.9 % IV SOLN
Freq: Once | INTRAVENOUS | Status: AC
  Administered 2018-05-20: 12:00:00 via INTRAVENOUS
  Filled 2018-05-20: qty 5

## 2018-05-20 MED ORDER — SODIUM CHLORIDE 0.9 % IV SOLN
900.0000 mg | Freq: Once | INTRAVENOUS | Status: AC
  Administered 2018-05-20: 900 mg via INTRAVENOUS
  Filled 2018-05-20: qty 20

## 2018-05-20 MED ORDER — HEPARIN SOD (PORK) LOCK FLUSH 100 UNIT/ML IV SOLN
500.0000 [IU] | Freq: Once | INTRAVENOUS | Status: AC | PRN
  Administered 2018-05-20: 500 [IU]
  Filled 2018-05-20: qty 5

## 2018-05-20 MED ORDER — PALONOSETRON HCL INJECTION 0.25 MG/5ML
0.2500 mg | Freq: Once | INTRAVENOUS | Status: AC
  Administered 2018-05-20: 0.25 mg via INTRAVENOUS
  Filled 2018-05-20: qty 5

## 2018-05-20 MED ORDER — PANTOPRAZOLE SODIUM 20 MG PO TBEC: 20.0000 mg | DELAYED_RELEASE_TABLET | Freq: Every day | ORAL | 2 refills | Status: DC

## 2018-05-20 MED ORDER — SODIUM CHLORIDE 0.9 % IV SOLN
Freq: Once | INTRAVENOUS | Status: AC
  Administered 2018-05-20: 10:00:00 via INTRAVENOUS
  Filled 2018-05-20: qty 250

## 2018-05-20 MED ORDER — POTASSIUM CHLORIDE 2 MEQ/ML IV SOLN
Freq: Once | INTRAVENOUS | Status: AC
  Administered 2018-05-20: 10:00:00 via INTRAVENOUS
  Filled 2018-05-20: qty 1000

## 2018-05-20 MED ORDER — SODIUM CHLORIDE 0.9 % IV SOLN
75.0000 mg/m2 | Freq: Once | INTRAVENOUS | Status: AC
  Administered 2018-05-20: 131 mg via INTRAVENOUS
  Filled 2018-05-20: qty 131

## 2018-05-20 MED ORDER — OXYCODONE-ACETAMINOPHEN 5-325 MG PO TABS: 1.0000 | ORAL_TABLET | ORAL | 0 refills | Status: DC | PRN

## 2018-05-20 NOTE — Progress Notes (Signed)
Hematology/Oncology Consult note [facility]  Telephone:([phone] [phone]  Patient Care Team: Patient, No Pcp Per as PCP - General ([place]) Telford Nab, RN as Registered Nurse   Name of the patient: Alexander [name]  [id number]  [birth year]   Date of visit: 05/20/18  Diagnosis-  lung adenocarcinoma Stage IIIB cT3 cN2 cM0  Chief complaint/ Reason for visit-on treatment assessment prior to cycle 4 of cisplatin and Alimta  Heme/Onc history: patient is a 52 year old male with a long-standing history of smoking. He has smoked 1 to 1/2 pack of cigarettes per day for over 30 years. He presented to outside urgent care with some symptoms of shortness of breath and was treated for URI. He subsequently presented with facial and neck swelling which was thought by outside ER to be secondary to a drug reaction. Following that patient came to ER here at [facility].  He underwent CT chest abdomen and pelvis which showed 2.1 x 2 cm irregular mass in the right upper lobe concerning for malignancy.. Multiple collateral veins in chest and abdomen probable thrombosis of the left brachiocephalic vein. This is consistent with IVC syndrome Probable right paratracheal right hilar and precarinal adenopathy resulting in severe narrowing and thrombosis of SVC. This is consistent with SVC syndrome  Patient was seen by vascular surgery and there was no acute need for any endovascular therapies. He is on anticoagulation with Coumadin which has been subsequently changed to Eliquis as an outpatient. Patient initially underwent a bronchoscopy guided biopsy of the mediastinal lymph nodes which was inconclusive and underwent a second IR guided biopsy of the right upper lobe lung mass which was consistent with adenocarcinoma  Patient started radiation treatment on 03/15/2018. Plan is to give 4 cycles of cisplatin and Alimta followed by maintenance durvalumab. Cycle 1  of cisplatin Alimta given on 03/16/2018  Interval history- he feels fatigued. He has lost about 9 pounds in the last 1 month. Denies any nausea vomiting but appetite is down  ECOG PS- 1 Pain scale- 0 Opioid associated constipation- no  Review of systems- Review of Systems  Constitutional: Positive for malaise/fatigue. Negative for chills, fever and weight loss.  HENT: Negative for congestion, ear discharge and nosebleeds.   Eyes: Negative for blurred vision.  Respiratory: Negative for cough, hemoptysis, sputum production, shortness of breath and wheezing.   Cardiovascular: Negative for chest pain, palpitations, orthopnea and claudication.  Gastrointestinal: Negative for abdominal pain, blood in stool, constipation, diarrhea, heartburn, melena, nausea and vomiting.  Genitourinary: Negative for dysuria, flank pain, frequency, hematuria and urgency.  Musculoskeletal: Negative for back pain, joint pain and myalgias.  Skin: Negative for rash.  Neurological: Negative for dizziness, tingling, focal weakness, seizures, weakness and headaches.  Endo/Heme/Allergies: Does not bruise/bleed easily.  Psychiatric/Behavioral: Negative for depression and suicidal ideas. The patient does not have insomnia.       Allergies  Allergen Reactions  . Doxycycline Swelling  . Penicillins      Past Medical History:  Diagnosis Date  . COPD (chronic obstructive pulmonary disease) ([place])   . Lung mass      Past Surgical History:  Procedure Laterality Date  . ENDOBRONCHIAL ULTRASOUND N/A 03/11/2018   Procedure: ENDOBRONCHIAL ULTRASOUND;  Surgeon: Tyler Pita, MD;  Location: [facility] ORS;  Service: Cardiopulmonary;  Laterality: N/A;  . none    . PORTA CATH INSERTION N/A 03/12/2018   Procedure: PORTA CATH INSERTION, thigh;  Surgeon: Katha Cabal, MD;  Location: [place] [facility];  Service: Cardiovascular;  Laterality: N/A;    Social History   Socioeconomic History  . Marital status:  Divorced    Spouse name: Not on file  . Number of children: Not on file  . Years of education: Not on file  . Highest education level: Not on file  Occupational History  . Occupation: Cabin crew  Social Needs  . Financial resource strain: Not on file  . Food insecurity:    Worry: Not on file    Inability: Not on file  . Transportation needs:    Medical: Not on file    Non-medical: Not on file  Tobacco Use  . Smoking status: Current Every Day Smoker  . Smokeless tobacco: Never Used  Substance and Sexual Activity  . Alcohol use: Not Currently  . Drug use: Not Currently  . Sexual activity: Yes  Lifestyle  . Physical activity:    Days per week: Not on file    Minutes per session: Not on file  . Stress: Not on file  Relationships  . Social connections:    Talks on phone: Not on file    Gets together: Not on file    Attends religious service: Not on file    Active member of club or organization: Not on file    Attends meetings of clubs or organizations: Not on file    Relationship status: Not on file  . Intimate partner violence:    Fear of current or ex partner: Not on file    Emotionally abused: Not on file    Physically abused: Not on file    Forced sexual activity: Not on file  Other Topics Concern  . Not on file  Social History Narrative  . Not on file    No family history on file.   Current Outpatient Medications:  .  apixaban (ELIQUIS) 5 MG TABS tablet, Take 1 tablet (5 mg total) by mouth 2 (two) times daily., Disp: 60 tablet, Rfl: 3 .  chlorproMAZINE (THORAZINE) 25 MG tablet, Take 1 tablet (25 mg total) by mouth every 6 (six) hours as needed (hiccups). (Patient not taking: Reported on 04/29/2018), Disp: 30 tablet, Rfl: 1 .  dexamethasone (DECADRON) 4 MG tablet, Take 1 tab two times a day the day before Alimta chemo. Take 2 tabs the day after chemo, then take 2 tabs two times a day for 2 days., Disp: 30 tablet, Rfl: 1 .  folic acid (FOLVITE) 1 MG tablet, Take 1  tablet (1 mg total) by mouth daily. Start 5-7 days before Alimta chemotherapy. Continue until 21 days after Alimta completed., Disp: 100 tablet, Rfl: 3 .  lidocaine-prilocaine (EMLA) cream, Apply to affected area once, Disp: 30 g, Rfl: 3 .  ondansetron (ZOFRAN) 8 MG tablet, Take 1 tablet (8 mg total) by mouth 2 (two) times daily as needed (Nausea or vomiting). Start if needed on the third day after chemotherapy., Disp: 30 tablet, Rfl: 1 .  oxyCODONE-acetaminophen (PERCOCET/ROXICET) 5-325 MG tablet, Take 1-2 tablets by mouth every 4 (four) hours as needed for moderate pain. (Patient not taking: Reported on 04/29/2018), Disp: 30 tablet, Rfl: 0 .  pantoprazole (PROTONIX) 20 MG tablet, Take 1 tablet (20 mg total) by mouth daily., Disp: 30 tablet, Rfl: 2 .  predniSONE (DELTASONE) 10 MG tablet, Take 1 tablet (10 mg total) by mouth daily with breakfast. (Patient not taking: Reported on 04/29/2018), Disp: 7 tablet, Rfl: 0  Physical exam:  Vitals:   05/20/18 0905  BP: 119/84  Pulse: Marland Kitchen)  112  Resp: 18  Temp: (!) 97 F (36.1 C)  TempSrc: Tympanic  SpO2: 97%  Weight: 127 lb 9.6 oz (57.9 kg)   Physical Exam Constitutional:      General: He is not in acute distress.    Comments: Thin man who appears mildly fatigued  HENT:     Head: Normocephalic and atraumatic.  Eyes:     Pupils: Pupils are equal, round, and reactive to light.  Neck:     Musculoskeletal: Normal range of motion.  Cardiovascular:     Rate and Rhythm: Normal rate and regular rhythm.     Heart sounds: Normal heart sounds.  Pulmonary:     Effort: Pulmonary effort is normal.     Breath sounds: Normal breath sounds.  Abdominal:     General: Bowel sounds are normal.     Palpations: Abdomen is soft.  Skin:    General: Skin is warm and dry.  Neurological:     Mental Status: He is alert and oriented to person, place, and time.      CMP Latest Ref Rng & Units 04/29/2018  Glucose 70 - 99 mg/dL 213(H)  BUN 6 - 20 mg/dL 26(H)    Creatinine 0.61 - 1.24 mg/dL 1.19  Sodium 135 - 145 mmol/L 138  Potassium 3.5 - 5.1 mmol/L 3.8  Chloride 98 - 111 mmol/L 102  CO2 22 - 32 mmol/L 27  Calcium 8.9 - 10.3 mg/dL 8.7(L)  Total Protein 6.5 - 8.1 g/dL 6.2(L)  Total Bilirubin 0.3 - 1.2 mg/dL 0.4  Alkaline Phos 38 - 126 U/L 100  AST 15 - 41 U/L 17  ALT 0 - 44 U/L 18   CBC Latest Ref Rng & Units 04/29/2018  WBC 4.0 - 10.5 K/uL 9.3  Hemoglobin 13.0 - 17.0 g/dL 10.7(L)  Hematocrit 39.0 - 52.0 % 33.0(L)  Platelets 150 - 400 K/uL 319      Assessment and plan- Patient is a 52 y.o. male male adenocarcinoma of the right lung stage IIIb CT 3 N2 M0 presenting as SVC syndrome.    He is here for on treatment assessment prior to cycle 4 of cisplatin and Alimta  Patient completes his radiation treatment on 05/24/2018.  Counts are okay to proceed with cycle 4 of cisplatin and Alimta today.  I will plan to get repeat CT chest abdomen and pelvis with contrast in about 1 week's time.  I will see him back in 2 weeks time with CBC, TSH and CMP to discuss the results of his CT scan and tentatively to start first cycle of maintenance durvalumab  Chemo-induced anemia: I will check ferritin and iron studies along with B12 and folate today.  This is likely chemotherapy-induced and should improve in the next couple of months given that this is his last dose of chemotherapy   Visit Diagnosis 1. Encounter for antineoplastic chemotherapy   2. Malignant neoplasm of upper lobe of right lung ([place])   3. Antineoplastic chemotherapy induced anemia      Dr. Randa Evens, MD, MPH [facility] [phone] 05/20/2018 12:46 PM

## 2018-05-20 NOTE — Progress Notes (Signed)
Patient no longer getting Emend from [employer]. based on depleted indigent supply. DOS covered are  10/23, 11/14, 12/5.

## 2018-05-20 NOTE — Progress Notes (Signed)
No new changes noted today 

## 2018-05-20 NOTE — Addendum Note (Signed)
Addended by: Luella Cook on: 05/20/2018 12:51 PM   Modules accepted: Orders

## 2018-05-21 ENCOUNTER — Ambulatory Visit
Admission: RE | Admit: 2018-05-21 | Discharge: 2018-05-21 | Disposition: A | Discharge status: Home / Self Care | Payer: BLUE CROSS/BLUE SHIELD | Source: Ambulatory Visit | Attending: Radiation Oncology | Admitting: Radiation Oncology

## 2018-05-21 DIAGNOSIS — Z510 Encounter for antineoplastic radiation therapy: Secondary | ICD-10-CM | POA: Diagnosis not present

## 2018-05-24 ENCOUNTER — Ambulatory Visit
Admission: RE | Admit: 2018-05-24 | Discharge: 2018-05-24 | Disposition: A | Discharge status: Home / Self Care | Payer: BLUE CROSS/BLUE SHIELD | Source: Ambulatory Visit | Attending: Radiation Oncology | Admitting: Radiation Oncology

## 2018-05-24 DIAGNOSIS — Z510 Encounter for antineoplastic radiation therapy: Secondary | ICD-10-CM | POA: Diagnosis not present

## 2018-05-31 ENCOUNTER — Other Ambulatory Visit: Payer: Self-pay | Admitting: Oncology

## 2018-05-31 ENCOUNTER — Ambulatory Visit
Admission: RE | Admit: 2018-05-31 | Discharge: 2018-05-31 | Disposition: A | Discharge status: Home / Self Care | Payer: Medicaid Other | Source: Ambulatory Visit | Attending: Oncology | Admitting: Oncology

## 2018-05-31 DIAGNOSIS — C3411 Malignant neoplasm of upper lobe, right bronchus or lung: Secondary | ICD-10-CM | POA: Diagnosis not present

## 2018-05-31 HISTORY — DX: Malignant (primary) neoplasm, unspecified: C80.1

## 2018-05-31 MED ORDER — IOPAMIDOL (ISOVUE-300) INJECTION 61%
80.0000 mL | Freq: Once | INTRAVENOUS | Status: AC | PRN
  Administered 2018-05-31: 80 mL via INTRAVENOUS

## 2018-06-01 ENCOUNTER — Other Ambulatory Visit: Payer: Self-pay | Admitting: Oncology

## 2018-06-01 NOTE — Progress Notes (Signed)
OFF PATHWAY REGIMEN - Non-Small Cell Lung  No Change  Continue With Treatment as Ordered.   [id number]:Cisplatin 75 mg/m2 + Pemetrexed 500 mg/m2 q21 Days:   A cycle is every 21 days:     Pemetrexed      Cisplatin   **Always confirm dose/schedule in your pharmacy ordering system**  Patient Characteristics: Stage III - Unresectable, PS = 0, 1 AJCC T Category: T3 Current Disease Status: No Distant Mets or Local Recurrence AJCC N Category: N2 AJCC M Category: M0 AJCC 8 Stage Grouping: IIIB Performance Status: PS = 0, 1 Intent of Therapy: Curative Intent, Discussed with Patient

## 2018-06-01 NOTE — Progress Notes (Signed)
DISCONTINUE OFF PATHWAY REGIMEN - Non-Small Cell Lung   [id number]:Cisplatin 75 mg/m2 + Pemetrexed 500 mg/m2 q21 Days:   A cycle is every 21 days:     Pemetrexed      Cisplatin   **Always confirm dose/schedule in your pharmacy ordering system**  REASON: Other Reason PRIOR TREATMENT: Off Pathway: Cisplatin 75 mg/m2 + Pemetrexed 500 mg/m2 q21 Days TREATMENT RESPONSE: Partial Response (PR)  START ON PATHWAY REGIMEN - Non-Small Cell Lung     A cycle is every 14 days:     Durvalumab   **Always confirm dose/schedule in your pharmacy ordering system**  Patient Characteristics: Stage III - Unresectable, PS = 0, 1 AJCC T Category: T3 Current Disease Status: No Distant Mets or Local Recurrence AJCC N Category: N2 AJCC M Category: M0 AJCC 8 Stage Grouping: IIIB Performance Status: PS = 0, 1 Intent of Therapy: Curative Intent, Discussed with Patient

## 2018-06-03 ENCOUNTER — Inpatient Hospital Stay (HOSPITAL_BASED_OUTPATIENT_CLINIC_OR_DEPARTMENT_OTHER): Payer: Medicaid Other | Admitting: Oncology

## 2018-06-03 ENCOUNTER — Other Ambulatory Visit: Payer: Self-pay

## 2018-06-03 ENCOUNTER — Inpatient Hospital Stay: Payer: Medicaid Other

## 2018-06-03 ENCOUNTER — Inpatient Hospital Stay: Payer: Medicaid Other | Attending: Oncology

## 2018-06-03 ENCOUNTER — Encounter: Payer: Self-pay | Admitting: Oncology

## 2018-06-03 VITALS — BP 104/70 | HR 121 | Temp 97.1°F | Resp 20 | Wt 121.2 lb

## 2018-06-03 DIAGNOSIS — R634 Abnormal weight loss: Secondary | ICD-10-CM | POA: Insufficient documentation

## 2018-06-03 DIAGNOSIS — E876 Hypokalemia: Secondary | ICD-10-CM | POA: Insufficient documentation

## 2018-06-03 DIAGNOSIS — R220 Localized swelling, mass and lump, head: Secondary | ICD-10-CM | POA: Insufficient documentation

## 2018-06-03 DIAGNOSIS — E860 Dehydration: Secondary | ICD-10-CM | POA: Insufficient documentation

## 2018-06-03 DIAGNOSIS — I871 Compression of vein: Secondary | ICD-10-CM | POA: Insufficient documentation

## 2018-06-03 DIAGNOSIS — Z7901 Long term (current) use of anticoagulants: Secondary | ICD-10-CM | POA: Insufficient documentation

## 2018-06-03 DIAGNOSIS — R7989 Other specified abnormal findings of blood chemistry: Secondary | ICD-10-CM | POA: Insufficient documentation

## 2018-06-03 DIAGNOSIS — C3411 Malignant neoplasm of upper lobe, right bronchus or lung: Secondary | ICD-10-CM | POA: Diagnosis present

## 2018-06-03 DIAGNOSIS — N179 Acute kidney failure, unspecified: Secondary | ICD-10-CM

## 2018-06-03 DIAGNOSIS — Z5112 Encounter for antineoplastic immunotherapy: Secondary | ICD-10-CM | POA: Insufficient documentation

## 2018-06-03 DIAGNOSIS — Z7189 Other specified counseling: Secondary | ICD-10-CM

## 2018-06-03 LAB — COMPREHENSIVE METABOLIC PANEL
ALBUMIN: 3.7 g/dL (ref 3.5–5.0)
ALT: 24 U/L (ref 0–44)
AST: 36 U/L (ref 15–41)
Alkaline Phosphatase: 133 U/L — ABNORMAL HIGH (ref 38–126)
Anion gap: 14 (ref 5–15)
BUN: 24 mg/dL — ABNORMAL HIGH (ref 6–20)
CALCIUM: 8.5 mg/dL — AB (ref 8.9–10.3)
CO2: 24 mmol/L (ref 22–32)
Chloride: 97 mmol/L — ABNORMAL LOW (ref 98–111)
Creatinine, Ser: 1.82 mg/dL — ABNORMAL HIGH (ref 0.61–1.24)
GFR calc Af Amer: 48 mL/min — ABNORMAL LOW (ref >60)
GFR calc non Af Amer: 41 mL/min — ABNORMAL LOW (ref >60)
Glucose, Bld: 205 mg/dL — ABNORMAL HIGH (ref 70–99)
Potassium: 2.9 mmol/L — ABNORMAL LOW (ref 3.5–5.1)
Sodium: 135 mmol/L (ref 135–145)
Total Bilirubin: 0.5 mg/dL (ref 0.3–1.2)
Total Protein: 6.8 g/dL (ref 6.5–8.1)

## 2018-06-03 LAB — CBC WITH DIFFERENTIAL/PLATELET
Abs Immature Granulocytes: 0.04 10*3/uL (ref 0.00–0.07)
BASOS PCT: 0 %
Basophils Absolute: 0 10*3/uL (ref 0.0–0.1)
EOS ABS: 0 10*3/uL (ref 0.0–0.5)
Eosinophils Relative: 0 %
HCT: 28.3 % — ABNORMAL LOW (ref 39.0–52.0)
Hemoglobin: 9.4 g/dL — ABNORMAL LOW (ref 13.0–17.0)
IMMATURE GRANULOCYTES: 1 %
Lymphocytes Relative: 9 %
Lymphs Abs: 0.5 10*3/uL — ABNORMAL LOW (ref 0.7–4.0)
MCH: 30.3 pg (ref 26.0–34.0)
MCHC: 33.2 g/dL (ref 30.0–36.0)
MCV: 91.3 fL (ref 80.0–100.0)
Monocytes Absolute: 0.3 10*3/uL (ref 0.1–1.0)
Monocytes Relative: 6 %
NEUTROS PCT: 84 %
Neutro Abs: 4.8 10*3/uL (ref 1.7–7.7)
Platelets: 144 10*3/uL — ABNORMAL LOW (ref 150–400)
RBC: 3.1 MIL/uL — ABNORMAL LOW (ref 4.22–5.81)
RDW: 15.4 % (ref 11.5–15.5)
WBC: 5.7 10*3/uL (ref 4.0–10.5)
nRBC: 0 % (ref 0.0–0.2)

## 2018-06-03 MED ORDER — HEPARIN SOD (PORK) LOCK FLUSH 100 UNIT/ML IV SOLN
500.0000 [IU] | Freq: Once | INTRAVENOUS | Status: AC
  Administered 2018-06-03: 500 [IU] via INTRAVENOUS
  Filled 2018-06-03: qty 5

## 2018-06-03 MED ORDER — SODIUM CHLORIDE 0.9 % IV SOLN
Freq: Once | INTRAVENOUS | Status: AC
  Administered 2018-06-03: 11:00:00 via INTRAVENOUS
  Filled 2018-06-03: qty 1000

## 2018-06-03 MED ORDER — SODIUM CHLORIDE 0.9 % IV SOLN
Freq: Once | INTRAVENOUS | Status: AC
  Administered 2018-06-03: 10:00:00 via INTRAVENOUS
  Filled 2018-06-03: qty 1000

## 2018-06-03 NOTE — Progress Notes (Signed)
Tumor Board Documentation  Zaven Klemens was presented by Dr Janese Banks at our Tumor Board on 06/03/2018, which included representatives from medical oncology, radiation oncology, surgical oncology, internal medicine, navigation, pathology, radiology, surgical, research, palliative care, pulmonology.  Joab currently presents as a current patient with history of the following treatments: adjuvant chemotherapy, adjuvant radiation.  Additionally, we reviewed previous medical and familial history, history of present illness, and recent lab results along with all available histopathologic and imaging studies. The tumor board considered available treatment options and made the following recommendations: Immunotherapy Try antibiotics  The following procedures/referrals were also placed: No orders of the defined types were placed in this encounter.   Clinical Trial Status: not discussed   Staging used: AJCC Stage Group  National site-specific guidelines NCCN were discussed with respect to the case.  Tumor board is a meeting of clinicians from various specialty areas who evaluate and discuss patients for whom a multidisciplinary approach is being considered. Final determinations in the plan of care are those of the provider(s). The responsibility for follow up of recommendations given during tumor board is that of the provider.   Today's extended care, comprehensive team conference, Gleen was not present for the discussion and was not examined.   Multidisciplinary Tumor Board is a multidisciplinary case peer review process.  Decisions discussed in the Multidisciplinary Tumor Board reflect the opinions of the specialists present at the conference without having examined the patient.  Ultimately, treatment and diagnostic decisions rest with the primary provider(s) and the patient.

## 2018-06-03 NOTE — Progress Notes (Signed)
Concerned about ongoing Hoarseness, congestion and cough. Phlegm is yellowish in color. Coughing episodes have been known to make him dizzy and weak. O2 sat 95% today. Patient reports losing 6 lbs since last visit.

## 2018-06-03 NOTE — Progress Notes (Signed)
Hematology/Oncology Consult note [facility]  [phone] [phone]  Patient Care Team: Patient, No Pcp Per as PCP - General ([place]) Telford Nab, RN as Registered Nurse   Name of the patient: Alexander [name]  [id number]  Sep 03, 1965   Date of visit: 06/03/18  Diagnosis- lung adenocarcinoma Stage IIIB cT3 cN2 cM0  Chief complaint/ Reason for visit- discuss CT scan results  Heme/Onc history: patient is a 53 year old male with a long-standing history of smoking. He has smoked 1 to 1/2 pack of cigarettes per day for over 30 years. He presented to outside urgent care with some symptoms of shortness of breath and was treated for URI. He subsequently presented with facial and neck swelling which was thought by outside ER to be secondary to a drug reaction. Following that patient came to ER here at [facility]  He underwent CT chest abdomen and pelvis which showed 2.1 x 2 cm irregular mass in the right upper lobe concerning for malignancy.. Multiple collateral veins in chest and abdomen probable thrombosis of the left brachiocephalic vein. This is consistent with IVC syndrome Probable right paratracheal right hilar and precarinal adenopathy resulting in severe narrowing and thrombosis of SVC. This is consistent with SVC syndrome  Patient was seen by vascular surgery and there was no acute need for any endovascular therapies. He is on anticoagulation with Coumadin which has been subsequently changed to Eliquis as an outpatient. Patient initially underwent a bronchoscopy guided biopsy of the mediastinal lymph nodes which was inconclusive and underwent a second IR guided biopsy of the right upper lobe lung mass which was consistent with adenocarcinoma  Patient started radiation treatment on 03/15/2018. Plan is to give 4 cycles of cisplatin and Alimta followed by maintenance durvalumab. Cycle 1 of cisplatin Alimta given on  03/16/2018  Interval history- feels weak. He has lost 16 pounds since October 2019. He has some productive cough but no fever. Denies any nausea  ECOG PS- 1 Pain scale- 0 Opioid associated constipation- no  Review of systems- Review of Systems  Constitutional: Positive for malaise/fatigue. Negative for chills, fever and weight loss.  HENT: Negative for congestion, ear discharge and nosebleeds.   Eyes: Negative for blurred vision.  Respiratory: Positive for cough. Negative for hemoptysis, sputum production, shortness of breath and wheezing.   Cardiovascular: Negative for chest pain, palpitations, orthopnea and claudication.  Gastrointestinal: Negative for abdominal pain, blood in stool, constipation, diarrhea, heartburn, melena, nausea and vomiting.  Genitourinary: Negative for dysuria, flank pain, frequency, hematuria and urgency.  Musculoskeletal: Negative for back pain, joint pain and myalgias.  Skin: Negative for rash.  Neurological: Negative for dizziness, tingling, focal weakness, seizures, weakness and headaches.  Endo/Heme/Allergies: Does not bruise/bleed easily.  Psychiatric/Behavioral: Negative for depression and suicidal ideas. The patient does not have insomnia.        Allergies  Allergen Reactions  . Doxycycline Swelling  . Penicillins      Past Medical History:  Diagnosis Date  . Cancer ([place])   . COPD (chronic obstructive pulmonary disease) ([place])   . Lung mass      Past Surgical History:  Procedure Laterality Date  . ENDOBRONCHIAL ULTRASOUND N/A 03/11/2018   Procedure: ENDOBRONCHIAL ULTRASOUND;  Surgeon: Tyler Pita, MD;  Location: [facility] ORS;  Service: Cardiopulmonary;  Laterality: N/A;  . none    . PORTA CATH INSERTION N/A 03/12/2018   Procedure: PORTA CATH INSERTION, thigh;  Surgeon: Katha Cabal, MD;  Location: [facility];  Service: Cardiovascular;  Laterality: N/A;    Social History   Socioeconomic History  . Marital status:  Divorced    Spouse name: Not on file  . Number of children: Not on file  . Years of education: Not on file  . Highest education level: Not on file  Occupational History  . Occupation: Cabin crew  Social Needs  . Financial resource strain: Not on file  . Food insecurity:    Worry: Not on file    Inability: Not on file  . Transportation needs:    Medical: Not on file    Non-medical: Not on file  Tobacco Use  . Smoking status: Current Every Day Smoker    Packs/day: 0.25  . Smokeless tobacco: Never Used  Substance and Sexual Activity  . Alcohol use: Not Currently  . Drug use: Yes    Types: Marijuana  . Sexual activity: Not Currently  Lifestyle  . Physical activity:    Days per week: Not on file    Minutes per session: Not on file  . Stress: Not on file  Relationships  . Social connections:    Talks on phone: Not on file    Gets together: Not on file    Attends religious service: Not on file    Active member of club or organization: Not on file    Attends meetings of clubs or organizations: Not on file    Relationship status: Not on file  . Intimate partner violence:    Fear of current or ex partner: Not on file    Emotionally abused: Not on file    Physically abused: Not on file    Forced sexual activity: Not on file  Other Topics Concern  . Not on file  Social History Narrative  . Not on file    No family history on file.   Current Outpatient Medications:  .  apixaban (ELIQUIS) 5 MG TABS tablet, Take 1 tablet (5 mg total) by mouth 2 (two) times daily., Disp: 60 tablet, Rfl: 3 .  oxyCODONE-acetaminophen (PERCOCET/ROXICET) 5-325 MG tablet, Take 1-2 tablets by mouth every 4 (four) hours as needed for moderate pain., Disp: 30 tablet, Rfl: 0 .  pantoprazole (PROTONIX) 20 MG tablet, Take 1 tablet (20 mg total) by mouth daily., Disp: 30 tablet, Rfl: 2 .  chlorproMAZINE (THORAZINE) 25 MG tablet, Take 1 tablet (25 mg total) by mouth every 6 (six) hours as needed  (hiccups). (Patient not taking: Reported on 04/29/2018), Disp: 30 tablet, Rfl: 1  Physical exam:  Vitals:   06/03/18 0857  BP: 104/70  Pulse: (!) 121  Resp: 20  Temp: (!) 97.1 F (36.2 C)  TempSrc: Tympanic  Weight: 121 lb 3.2 oz (55 kg)   Physical Exam Constitutional:      Comments: Appears fatigued  HENT:     Head: Normocephalic and atraumatic.  Eyes:     Pupils: Pupils are equal, round, and reactive to light.  Neck:     Musculoskeletal: Normal range of motion.  Cardiovascular:     Rate and Rhythm: Normal rate and regular rhythm.     Heart sounds: Normal heart sounds.  Pulmonary:     Effort: Pulmonary effort is normal.     Comments: Bibasilar rhonchi Abdominal:     General: Bowel sounds are normal.     Palpations: Abdomen is soft.  Skin:    General: Skin is warm and dry.  Neurological:     Mental Status: He is alert and oriented to  person, place, and time.      CMP Latest Ref Rng & Units 06/03/2018  Glucose 70 - 99 mg/dL 205(H)  BUN 6 - 20 mg/dL 24(H)  Creatinine 0.61 - 1.24 mg/dL 1.82(H)  Sodium 135 - 145 mmol/L 135  Potassium 3.5 - 5.1 mmol/L 2.9(L)  Chloride 98 - 111 mmol/L 97(L)  CO2 22 - 32 mmol/L 24  Calcium 8.9 - 10.3 mg/dL 8.5(L)  Total Protein 6.5 - 8.1 g/dL 6.8  Total Bilirubin 0.3 - 1.2 mg/dL 0.5  Alkaline Phos 38 - 126 U/L 133(H)  AST 15 - 41 U/L 36  ALT 0 - 44 U/L 24   CBC Latest Ref Rng & Units 06/03/2018  WBC 4.0 - 10.5 K/uL 5.7  Hemoglobin 13.0 - 17.0 g/dL 9.4(L)  Hematocrit 39.0 - 52.0 % 28.3(L)  Platelets 150 - 400 K/uL 144(L)    No images are attached to the encounter.  Ct Chest W Contrast  Result Date: 05/31/2018 CLINICAL DATA:  Lung cancer diagnosed in October. Recently quit smoking. EXAM: CT CHEST, ABDOMEN, AND PELVIS WITH CONTRAST TECHNIQUE: Multidetector CT imaging of the chest, abdomen and pelvis was performed following the standard protocol during bolus administration of intravenous contrast. CONTRAST:  53mL ISOVUE-300 IOPAMIDOL  (ISOVUE-300) INJECTION 61% COMPARISON:  03/09/2018 chest abdomen and pelvic CTs. 03/18/2018 PET. FINDINGS: CT CHEST FINDINGS Cardiovascular: Aortic atherosclerosis. Normal heart size, without pericardial effusion. No central pulmonary embolism, on this non-dedicated study. SVC occlusion and collateral veins about the chest wall again identified. Mediastinum/Nodes: No supraclavicular adenopathy. Confluent right paratracheal adenopathy measures 2.1 x 1.7 cm on image 25/2 versus 3.2 x 2.3 cm on the prior. The right hilar adenopathy has resolved, with only mild soft tissue thickening remaining on image 29/2. Lungs/Pleura: No pleural fluid. Moderate to marked centrilobular emphysema with right greater than left bronchial wall thickening. Spiculated pleural-based right upper lobe pulmonary nodule measures 1.6 x 1.4 cm on image 32/3 versus 2.1 x 2.0 cm on the prior. Bilateral upper lobe predominant peribronchovascular nodularity is significantly progressive compared to 03/09/2018. Some of the areas of nodularity have a "tree-in-bud" morphology. Suspect mucoid impaction in the right upper lobe on image 74/3. Clustered anterior left upper lobe nodules including at maximally 1.4 cm on image 109/3, new. Musculoskeletal: No acute osseous abnormality. CT ABDOMEN PELVIS FINDINGS Hepatobiliary: Normal liver. Normal gallbladder, without biliary ductal dilatation. Pancreas: Normal, without mass or ductal dilatation. Spleen: Normal in size, without focal abnormality. Adrenals/Urinary Tract: Normal right adrenal gland. Similar mild left adrenal thickening. Subtle heterogeneous enhancement of both kidneys on delayed images. Example laterally on the right on image 15/9 relatively diffusely on the left on image 24/9. No hydronephrosis. Normal urinary bladder. Stomach/Bowel: Normal stomach, without wall thickening. Colonic stool burden suggests constipation. Normal small bowel. Vascular/Lymphatic: Separate origins of the common hepatic  and splenic arteries. Advanced aortic and branch vessel atherosclerosis. Right femoral venous line terminating within the IVC just inferior to the hepatic veins. No abdominopelvic adenopathy. Reproductive: Normal prostate. Other: No significant free fluid. Paucity of intra-abdominal fat. No gross peritoneal/omental metastasis. Musculoskeletal: Sclerotic lesions within the pelvis are unchanged and likely bone islands. IMPRESSION: 1. Response to therapy of right upper lobe primary and thoracic nodal metastasis. 2. Progressive upper lobe predominant peribronchovascular nodularity, likely related to infection, including atypical etiologies. 3. No acute process or evidence of metastatic disease in the abdomen or pelvis. 4. Subtle heterogeneous enhancement of the kidneys on delayed images. Correlate with symptoms to suggest pyelonephritis. 5. Possible constipation. 6. Aortic atherosclerosis (ICD10-I70.0)  and emphysema (ICD10-J43.9). Electronically Signed   By: Abigail Miyamoto M.D.   On: 05/31/2018 11:31   Ct Abdomen Pelvis W Contrast  Result Date: 05/31/2018 CLINICAL DATA:  Lung cancer diagnosed in October. Recently quit smoking. EXAM: CT CHEST, ABDOMEN, AND PELVIS WITH CONTRAST TECHNIQUE: Multidetector CT imaging of the chest, abdomen and pelvis was performed following the standard protocol during bolus administration of intravenous contrast. CONTRAST:  55mL ISOVUE-300 IOPAMIDOL (ISOVUE-300) INJECTION 61% COMPARISON:  03/09/2018 chest abdomen and pelvic CTs. 03/18/2018 PET. FINDINGS: CT CHEST FINDINGS Cardiovascular: Aortic atherosclerosis. Normal heart size, without pericardial effusion. No central pulmonary embolism, on this non-dedicated study. SVC occlusion and collateral veins about the chest wall again identified. Mediastinum/Nodes: No supraclavicular adenopathy. Confluent right paratracheal adenopathy measures 2.1 x 1.7 cm on image 25/2 versus 3.2 x 2.3 cm on the prior. The right hilar adenopathy has resolved,  with only mild soft tissue thickening remaining on image 29/2. Lungs/Pleura: No pleural fluid. Moderate to marked centrilobular emphysema with right greater than left bronchial wall thickening. Spiculated pleural-based right upper lobe pulmonary nodule measures 1.6 x 1.4 cm on image 32/3 versus 2.1 x 2.0 cm on the prior. Bilateral upper lobe predominant peribronchovascular nodularity is significantly progressive compared to 03/09/2018. Some of the areas of nodularity have a "tree-in-bud" morphology. Suspect mucoid impaction in the right upper lobe on image 74/3. Clustered anterior left upper lobe nodules including at maximally 1.4 cm on image 109/3, new. Musculoskeletal: No acute osseous abnormality. CT ABDOMEN PELVIS FINDINGS Hepatobiliary: Normal liver. Normal gallbladder, without biliary ductal dilatation. Pancreas: Normal, without mass or ductal dilatation. Spleen: Normal in size, without focal abnormality. Adrenals/Urinary Tract: Normal right adrenal gland. Similar mild left adrenal thickening. Subtle heterogeneous enhancement of both kidneys on delayed images. Example laterally on the right on image 15/9 relatively diffusely on the left on image 24/9. No hydronephrosis. Normal urinary bladder. Stomach/Bowel: Normal stomach, without wall thickening. Colonic stool burden suggests constipation. Normal small bowel. Vascular/Lymphatic: Separate origins of the common hepatic and splenic arteries. Advanced aortic and branch vessel atherosclerosis. Right femoral venous line terminating within the IVC just inferior to the hepatic veins. No abdominopelvic adenopathy. Reproductive: Normal prostate. Other: No significant free fluid. Paucity of intra-abdominal fat. No gross peritoneal/omental metastasis. Musculoskeletal: Sclerotic lesions within the pelvis are unchanged and likely bone islands. IMPRESSION: 1. Response to therapy of right upper lobe primary and thoracic nodal metastasis. 2. Progressive upper lobe  predominant peribronchovascular nodularity, likely related to infection, including atypical etiologies. 3. No acute process or evidence of metastatic disease in the abdomen or pelvis. 4. Subtle heterogeneous enhancement of the kidneys on delayed images. Correlate with symptoms to suggest pyelonephritis. 5. Possible constipation. 6. Aortic atherosclerosis (ICD10-I70.0) and emphysema (ICD10-J43.9). Electronically Signed   By: Abigail Miyamoto M.D.   On: 05/31/2018 11:31     Assessment and plan- Patient is a 53 y.o. male adenocarcinoma of the right lung stage IIIb CT 3 N2 M0 presenting as SVC syndrome status post concurrent chemoradiation and 4 cycles of cisplatin and Alimta  I have reviewed his CT chest abdomen pelvis images independently and discussed findings with the patient.  Overall there has been a decrease in the size of his right upper lobe mass as well as mediastinal and hilar adenopathy indicating partial response.  It would therefore be okay to proceed with maintenance durvalumab.  However patient has AKI with a creatinine of 1.8 and significant hypokalemia with potassium of 2.9 today.  I will therefore give him 2 L of IV  fluids along with 40 mEq of IV potassium.  We will give him liquid oral potassium to take at home.  He will come back tomorrow for more IV fluids with a repeat BMP for possible more potassium and/or magnesium.  He will also come back next week for IV fluids and potassium or magnesium.  I will give him about 2 weeks to recover from the effects of chemoradiation given his significant weight loss and fatigue.  I have encouraged him to improve his oral as well as fluid intake and take part in some basic exercise program.  I have encouraged him to use Ensure 2 times a day which she has not been doing it consistently.  I will see him back in 2 weeks time with CBC CMP to start his first cycle of maintenance durvalumab.  Treatment will be given with a curative intent CT scan also showed  possible tree-in-bud opacities in bilateral upper lobes concerning for atypical infection.  I will review these findings and tumor board today and consider starting him on empiric antibiotics after discussion at tumor board.   Visit Diagnosis 1. Malignant neoplasm of upper lobe of right lung ([place])   2. AKI (acute kidney injury) ([place])   3. Hypokalemia   4. Goals of care, counseling/discussion   5. Dehydration      Dr. Randa Evens, MD, MPH [facility] at [facility] [phone] 06/03/2018 12:07 PM

## 2018-06-04 ENCOUNTER — Telehealth: Payer: Self-pay | Admitting: *Deleted

## 2018-06-04 ENCOUNTER — Inpatient Hospital Stay: Payer: Medicaid Other

## 2018-06-04 ENCOUNTER — Other Ambulatory Visit: Payer: Self-pay | Admitting: *Deleted

## 2018-06-04 ENCOUNTER — Other Ambulatory Visit: Payer: Self-pay | Admitting: Oncology

## 2018-06-04 DIAGNOSIS — R7989 Other specified abnormal findings of blood chemistry: Secondary | ICD-10-CM

## 2018-06-04 DIAGNOSIS — N179 Acute kidney failure, unspecified: Secondary | ICD-10-CM

## 2018-06-04 DIAGNOSIS — C3411 Malignant neoplasm of upper lobe, right bronchus or lung: Secondary | ICD-10-CM

## 2018-06-04 DIAGNOSIS — Z5112 Encounter for antineoplastic immunotherapy: Secondary | ICD-10-CM | POA: Diagnosis not present

## 2018-06-04 DIAGNOSIS — E876 Hypokalemia: Secondary | ICD-10-CM

## 2018-06-04 LAB — BASIC METABOLIC PANEL
Anion gap: 10 (ref 5–15)
BUN: 23 mg/dL — ABNORMAL HIGH (ref 6–20)
CO2: 25 mmol/L (ref 22–32)
Calcium: 8.8 mg/dL — ABNORMAL LOW (ref 8.9–10.3)
Chloride: 105 mmol/L (ref 98–111)
Creatinine, Ser: 1.53 mg/dL — ABNORMAL HIGH (ref 0.61–1.24)
GFR calc Af Amer: 59 mL/min — ABNORMAL LOW (ref >60)
GFR calc non Af Amer: 51 mL/min — ABNORMAL LOW (ref >60)
Glucose, Bld: 81 mg/dL (ref 70–99)
Potassium: 3.9 mmol/L (ref 3.5–5.1)
Sodium: 140 mmol/L (ref 135–145)

## 2018-06-04 LAB — TSH: TSH: 1.892 u[IU]/mL (ref 0.350–4.500)

## 2018-06-04 LAB — MAGNESIUM: Magnesium: 1.6 mg/dL — ABNORMAL LOW (ref 1.7–2.4)

## 2018-06-04 MED ORDER — HEPARIN SOD (PORK) LOCK FLUSH 100 UNIT/ML IV SOLN
500.0000 [IU] | Freq: Once | INTRAVENOUS | Status: AC
  Administered 2018-06-04: 500 [IU] via INTRAVENOUS
  Filled 2018-06-04: qty 5

## 2018-06-04 MED ORDER — SODIUM CHLORIDE 0.9% FLUSH
10.0000 mL | INTRAVENOUS | Status: DC | PRN
  Administered 2018-06-04: 10 mL via INTRAVENOUS
  Filled 2018-06-04: qty 10

## 2018-06-04 MED ORDER — SODIUM CHLORIDE 0.9 % IV SOLN
Freq: Once | INTRAVENOUS | Status: AC
  Administered 2018-06-04: 11:00:00 via INTRAVENOUS
  Filled 2018-06-04: qty 1000

## 2018-06-04 MED ORDER — POTASSIUM CHLORIDE CRYS ER 20 MEQ PO TBCR: 20.0000 meq | EXTENDED_RELEASE_TABLET | Freq: Every day | ORAL | 0 refills | Status: DC

## 2018-06-04 MED ORDER — LEVOFLOXACIN 750 MG PO TABS: 750.0000 mg | ORAL_TABLET | Freq: Every day | ORAL | 0 refills | Status: AC

## 2018-06-04 MED ORDER — SODIUM CHLORIDE 0.9 % IV SOLN
Freq: Once | INTRAVENOUS | Status: AC
  Administered 2018-06-04: 11:00:00 via INTRAVENOUS
  Filled 2018-06-04: qty 250

## 2018-06-04 NOTE — Telephone Encounter (Signed)
Called to [facility] and made them aware that the antibiotic of Levaquin that was sent in electronically by Dr. Janese Banks will be paid from the [place] as well as I am putting in a prescription for potassium and it will also be paid for on the [place] phone.  Patient has been told about the Levaquin antibiotic as well as the potassium pills and [facility] would [facility]  In addition to patient was told that while he is taken the Levaquin he cannot take Thorazine which is the medicine for his hiccups because it has a potential interaction with the drug.

## 2018-06-04 NOTE — Progress Notes (Signed)
Judeen Hammans went back to infusion and verbally told patient about new Rx and to not take thorazine while on Levaquin. Sherry willalso get e scribed Rx changed to [facility]

## 2018-06-07 ENCOUNTER — Telehealth: Payer: Self-pay | Admitting: Pharmacy Technician

## 2018-06-07 ENCOUNTER — Telehealth: Payer: Self-pay | Admitting: *Deleted

## 2018-06-07 NOTE — Telephone Encounter (Signed)
Pt's friend, Hilda Blades, called to report pt developed increased facial swelling over the weekend after exerted himself. She would like to know if pt can be seen when comes in for IVF on 1/14. Per Dr. Janese Banks, pt to be seen in [facility]. Pt scheduled to see Ander Purpura, NP at 1:30pm prior to IVF. Hilda Blades has been made aware of appt.

## 2018-06-07 NOTE — Telephone Encounter (Signed)
Oral Oncology Patient Advocate Encounter  Received notification from [place] Patient Assistance program that patient has been successfully enrolled into their program to receive Eliquis from the manufacturer at $0 out of pocket until 06/06/2019.   Application Case #: [id number]  He knows we will have to re-apply.   Patient knows to call the office with questions or concerns.   [place] [facility] [place] Phone [phone] Fax [phone] 06/07/2018 11:32 AM

## 2018-06-08 ENCOUNTER — Encounter: Payer: Self-pay | Admitting: Nurse Practitioner

## 2018-06-08 ENCOUNTER — Other Ambulatory Visit: Payer: Self-pay

## 2018-06-08 ENCOUNTER — Inpatient Hospital Stay (HOSPITAL_BASED_OUTPATIENT_CLINIC_OR_DEPARTMENT_OTHER): Payer: Medicaid Other | Admitting: Nurse Practitioner

## 2018-06-08 ENCOUNTER — Inpatient Hospital Stay: Payer: Medicaid Other

## 2018-06-08 VITALS — BP 101/72 | HR 121 | Temp 97.3°F | Resp 20 | Wt 125.0 lb

## 2018-06-08 DIAGNOSIS — C3411 Malignant neoplasm of upper lobe, right bronchus or lung: Secondary | ICD-10-CM

## 2018-06-08 DIAGNOSIS — Z5112 Encounter for antineoplastic immunotherapy: Secondary | ICD-10-CM | POA: Diagnosis not present

## 2018-06-08 DIAGNOSIS — Z7901 Long term (current) use of anticoagulants: Secondary | ICD-10-CM

## 2018-06-08 DIAGNOSIS — R220 Localized swelling, mass and lump, head: Secondary | ICD-10-CM | POA: Diagnosis not present

## 2018-06-08 DIAGNOSIS — R7989 Other specified abnormal findings of blood chemistry: Secondary | ICD-10-CM

## 2018-06-08 DIAGNOSIS — I871 Compression of vein: Secondary | ICD-10-CM | POA: Diagnosis not present

## 2018-06-08 LAB — BASIC METABOLIC PANEL
Anion gap: 9 (ref 5–15)
BUN: 15 mg/dL (ref 6–20)
CALCIUM: 8.9 mg/dL (ref 8.9–10.3)
CO2: 26 mmol/L (ref 22–32)
Chloride: 104 mmol/L (ref 98–111)
Creatinine, Ser: 2.05 mg/dL — ABNORMAL HIGH (ref 0.61–1.24)
GFR calc Af Amer: 42 mL/min — ABNORMAL LOW (ref >60)
GFR calc non Af Amer: 36 mL/min — ABNORMAL LOW (ref >60)
Glucose, Bld: 119 mg/dL — ABNORMAL HIGH (ref 70–99)
Potassium: 4.4 mmol/L (ref 3.5–5.1)
Sodium: 139 mmol/L (ref 135–145)

## 2018-06-08 LAB — MAGNESIUM: Magnesium: 1.7 mg/dL (ref 1.7–2.4)

## 2018-06-08 MED ORDER — SODIUM CHLORIDE 0.9 % IV SOLN
2.0000 g | Freq: Once | INTRAVENOUS | Status: AC
  Administered 2018-06-08: 2 g via INTRAVENOUS
  Filled 2018-06-08: qty 4

## 2018-06-08 MED ORDER — SODIUM CHLORIDE 0.9 % IV SOLN
Freq: Once | INTRAVENOUS | Status: AC
  Administered 2018-06-08: 15:00:00 via INTRAVENOUS
  Filled 2018-06-08: qty 250

## 2018-06-08 MED ORDER — HEPARIN SOD (PORK) LOCK FLUSH 100 UNIT/ML IV SOLN
500.0000 [IU] | Freq: Once | INTRAVENOUS | Status: AC
  Administered 2018-06-08: 500 [IU] via INTRAVENOUS
  Filled 2018-06-08: qty 5

## 2018-06-08 MED ORDER — SODIUM CHLORIDE 0.9% FLUSH
10.0000 mL | Freq: Once | INTRAVENOUS | Status: AC
  Administered 2018-06-08: 10 mL via INTRAVENOUS
  Filled 2018-06-08: qty 10

## 2018-06-08 NOTE — Progress Notes (Signed)
[facility]  [phone] [phone]  Patient Care Team: Patient, No Pcp Per as PCP - General ([place]) Telford Nab, RN as Registered Nurse   Name of the patient: Alexander [name]  [id number]  14-May-1966   Date of visit: 06/08/18  Diagnosis- Lung Cancer - stage IIIB  Chief complaint/ Reason for visit- Facial Swelling  Heme/Onc history:  1. Alexander [name] presented as a 53 year old male with longstanding history of smoking.  He has smoked 1 to 1/2 pack of cigarettes per day for over 30 years.  He presented to outside urgent care with some symptoms of shortness of breath and was treated for URI.  He subsequently presented with facial and neck swelling which was thought by outside ER to be secondary to drug reaction.  Following that, patient came to ER at [facility] where he underwent CT Chest/Abdomen/Pelvis which showed 2.1 x 2 cm irregular mass in the right upper lobe concerning for malignancy.  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  Probable right paratracheal right hilar and precarinal adenopathy resulting in severe narrowing and thrombosis of SVC.  This is consistent with SVC syndrome.  Patient was seen by vascular surgery and there was no acute need for any endovascular therapies.  He is on anticoagulation with Coumadin which is been subsequently changed to Eliquis as an outpatient.  Patient initially underwent a bronchoscopy guided biopsy of the mediastinal lymph nodes which was inconclusive and underwent a second IR guided biopsy of the right upper lobe lung mass which was consistent with adenocarcinoma.  2. Patient started radiation treatment on 03/15/2018.  Plan is to give 4 cycles of cisplatin and Alimta followed by maintenance durvalumab.  Cycle 1 of cisplatin-Alimta given on 03/16/2018.  Completed concurrent chemo-radiation and 4  cycles of cisplatin-Alimta 05/20/2018 & 05/24/18.   3. CT chest/abdomen/pelvis on 05/31/2018 revealed response to therapy of right upper lobe primary and thoracic nodal metastasis measuring 1.6 x 1.4 cm (previously 2.1 x 2.0 cm).  Progressive upper lobe predominant peribronchial cold vascular nodularity, likely related to infection, including atypical etiologies. SVC occlusion and collateral veins about the chest wall again identified.  Confluent right paratracheal adenopathy measured 2.1 x 1.7 cm (previously 3.2 x 2.3 cm).  Right hilar adenopathy had resolved.  4.  Case discussed at multidisciplinary case conference with recommendation for immunotherapy and antibiotics.     Lung cancer ([place])   03/16/2018 Initial Diagnosis    Lung cancer ([place])    03/16/2018 Cancer Staging    Staging form: Lung, AJCC 8th Edition - Clinical stage from 03/16/2018: Stage IIIB (cT3, cN2, cM0) - Signed by Sindy Guadeloupe, MD on 03/16/2018    03/17/2018 - 06/09/2018 Chemotherapy    The patient had palonosetron (ALOXI) injection 0.25 mg, 0.25 mg, Intravenous,  Once, 4 of 4 cycles Administration: 0.25 mg (03/17/2018), 0.25 mg (04/29/2018), 0.25 mg (05/20/2018), 0.25 mg (04/08/2018) PEMEtrexed (ALIMTA) 900 mg in sodium chloride 0.9 % 100 mL chemo infusion, 510 mg/m2 = 875 mg, Intravenous,  Once, 4 of 4 cycles Administration: 900 mg (03/17/2018), 900 mg (04/29/2018), 900 mg (05/20/2018), 900 mg (04/08/2018) CISplatin (PLATINOL) 131 mg in sodium chloride 0.9 % 500 mL chemo infusion, 75 mg/m2 = 131 mg, Intravenous,  Once, 4 of 4 cycles Administration: 131 mg (03/17/2018), 131 mg (04/29/2018), 131 mg (05/20/2018), 131 mg (04/08/2018) fosaprepitant (EMEND) 150 mg, dexamethasone (DECADRON) 12 mg in sodium chloride 0.9 % 145 mL  IVPB, , Intravenous,  Once, 4 of 4 cycles Administration:  (03/17/2018),  (04/29/2018),  (05/20/2018),  (04/08/2018)  for chemotherapy treatment.     06/03/2018 -  Chemotherapy    The patient had durvalumab  (IMFINZI) 580 mg in sodium chloride 0.9 % 100 mL chemo infusion, 10 mg/kg, Intravenous,  Once, 0 of 6 cycles  for chemotherapy treatment.      Interval history- Alexander [name], 53 year old male with above history of lung adenocarcinoma and SVC who [facility] for swelling of the face, chest, arm. Symptoms are consistent with SVC syndrome which he had at time of diagnosis and he feels symptoms have improved but are persistent. He has been trying to increase his physical activity but notices increased swelling following. He continues levaquin for lung infection on prior imaging and mucinex for cough. He feels symptoms are improving. He continues to smoke approximately 1/2 pack/day and is trying to cut back. Weight trending up. Poor appetite but drinking supplement shakes.   ECOG FS:1 - Symptomatic but completely ambulatory  Review of systems- Review of Systems  Constitutional: Positive for malaise/fatigue.       Weight up  HENT: Negative.        Voice changes improving  Eyes:       Watery eyes  Respiratory: Positive for cough and shortness of breath (with exertion- improved). Negative for wheezing.   Cardiovascular: Negative.   Gastrointestinal: Negative.   Genitourinary: Negative.   Musculoskeletal: Negative.   Skin: Negative.   Psychiatric/Behavioral: Negative.      Allergies  Allergen Reactions  . Doxycycline Swelling  . Penicillins     Past Medical History:  Diagnosis Date  . Cancer ([place])   . COPD (chronic obstructive pulmonary disease) ([place])   . Lung mass     Past Surgical History:  Procedure Laterality Date  . ENDOBRONCHIAL ULTRASOUND N/A 03/11/2018   Procedure: ENDOBRONCHIAL ULTRASOUND;  Surgeon: Tyler Pita, MD;  Location: [facility] ORS;  Service: Cardiopulmonary;  Laterality: N/A;  . none    . PORTA CATH INSERTION N/A 03/12/2018   Procedure: PORTA CATH INSERTION, thigh;  Surgeon: Katha Cabal, MD;  Location: [facility];   Service: Cardiovascular;  Laterality: N/A;    Social History   Socioeconomic History  . Marital status: Divorced    Spouse name: Not on file  . Number of children: Not on file  . Years of education: Not on file  . Highest education level: Not on file  Occupational History  . Occupation: Cabin crew  Social Needs  . Financial resource strain: Not on file  . Food insecurity:    Worry: Not on file    Inability: Not on file  . Transportation needs:    Medical: Not on file    Non-medical: Not on file  Tobacco Use  . Smoking status: Current Every Day Smoker    Packs/day: 0.25  . Smokeless tobacco: Never Used  Substance and Sexual Activity  . Alcohol use: Not Currently  . Drug use: Yes    Types: Marijuana  . Sexual activity: Not Currently  Lifestyle  . Physical activity:    Days per week: Not on file    Minutes per session: Not on file  . Stress: Not on file  Relationships  . Social connections:    Talks on phone: Not on file    Gets together: Not on file    Attends religious service: Not on file    Active  member of club or organization: Not on file    Attends meetings of clubs or organizations: Not on file    Relationship status: Not on file  . Intimate partner violence:    Fear of current or ex partner: Not on file    Emotionally abused: Not on file    Physically abused: Not on file    Forced sexual activity: Not on file  Other Topics Concern  . Not on file  Social History Narrative  . Not on file    No family history on file.   Current Outpatient Medications:  .  apixaban (ELIQUIS) 5 MG TABS tablet, Take 1 tablet (5 mg total) by mouth 2 (two) times daily., Disp: 60 tablet, Rfl: 3 .  levofloxacin (LEVAQUIN) 750 MG tablet, Take 1 tablet (750 mg total) by mouth daily for 7 days., Disp: 77 tablet, Rfl: 0 .  oxyCODONE-acetaminophen (PERCOCET/ROXICET) 5-325 MG tablet, Take 1-2 tablets by mouth every 4 (four) hours as needed for moderate pain., Disp: 30 tablet, Rfl:  0 .  pantoprazole (PROTONIX) 20 MG tablet, Take 1 tablet (20 mg total) by mouth daily., Disp: 30 tablet, Rfl: 2 .  potassium chloride SA (K-DUR,KLOR-CON) 20 MEQ tablet, Take 1 tablet (20 mEq total) by mouth daily., Disp: 7 tablet, Rfl: 0  Physical exam:  Vitals:   06/08/18 1336  BP: 101/72  Pulse: (!) 121  Resp: 20  Temp: (!) 97.3 F (36.3 C)  TempSrc: Tympanic  SpO2: 95%  Weight: 125 lb (56.7 kg)   Physical Exam Constitutional:      General: He is not in acute distress.    Comments: chronically ill appearing, thin built  HENT:     Head:     Comments: Fullness of lower face (improved from previous per pt)    Mouth/Throat:     Mouth: Mucous membranes are dry.  Eyes:     General: No scleral icterus.    Conjunctiva/sclera: Conjunctivae normal.     Comments: No orbital edema  Neck:     Musculoskeletal: Normal range of motion.  Cardiovascular:     Rate and Rhythm: Regular rhythm. Tachycardia present.     Pulses: Normal pulses.     Comments: Superficial venous distention across chest Distended neck veins Pulmonary:     Breath sounds: Rhonchi present.  Skin:    General: Skin is warm and dry.  Neurological:     Mental Status: He is alert and oriented to person, place, and time.  Psychiatric:        Mood and Affect: Mood normal.        Behavior: Behavior normal.      CMP Latest Ref Rng & Units 06/08/2018  Glucose 70 - 99 mg/dL 119(H)  BUN 6 - 20 mg/dL 15  Creatinine 0.61 - 1.24 mg/dL 2.05(H)  Sodium 135 - 145 mmol/L 139  Potassium 3.5 - 5.1 mmol/L 4.4  Chloride 98 - 111 mmol/L 104  CO2 22 - 32 mmol/L 26  Calcium 8.9 - 10.3 mg/dL 8.9  Total Protein 6.5 - 8.1 g/dL -  Total Bilirubin 0.3 - 1.2 mg/dL -  Alkaline Phos 38 - 126 U/L -  AST 15 - 41 U/L -  ALT 0 - 44 U/L -   CBC Latest Ref Rng & Units 06/03/2018  WBC 4.0 - 10.5 K/uL 5.7  Hemoglobin 13.0 - 17.0 g/dL 9.4(L)  Hematocrit 39.0 - 52.0 % 28.3(L)  Platelets 150 - 400 K/uL 144(L)    No images are attached  to  the encounter.  Ct Chest W Contrast  Result Date: 05/31/2018 CLINICAL DATA:  Lung cancer diagnosed in October. Recently quit smoking. EXAM: CT CHEST, ABDOMEN, AND PELVIS WITH CONTRAST TECHNIQUE: Multidetector CT imaging of the chest, abdomen and pelvis was performed following the standard protocol during bolus administration of intravenous contrast. CONTRAST:  75mL ISOVUE-300 IOPAMIDOL (ISOVUE-300) INJECTION 61% COMPARISON:  03/09/2018 chest abdomen and pelvic CTs. 03/18/2018 PET. FINDINGS: CT CHEST FINDINGS Cardiovascular: Aortic atherosclerosis. Normal heart size, without pericardial effusion. No central pulmonary embolism, on this non-dedicated study. SVC occlusion and collateral veins about the chest wall again identified. Mediastinum/Nodes: No supraclavicular adenopathy. Confluent right paratracheal adenopathy measures 2.1 x 1.7 cm on image 25/2 versus 3.2 x 2.3 cm on the prior. The right hilar adenopathy has resolved, with only mild soft tissue thickening remaining on image 29/2. Lungs/Pleura: No pleural fluid. Moderate to marked centrilobular emphysema with right greater than left bronchial wall thickening. Spiculated pleural-based right upper lobe pulmonary nodule measures 1.6 x 1.4 cm on image 32/3 versus 2.1 x 2.0 cm on the prior. Bilateral upper lobe predominant peribronchovascular nodularity is significantly progressive compared to 03/09/2018. Some of the areas of nodularity have a "tree-in-bud" morphology. Suspect mucoid impaction in the right upper lobe on image 74/3. Clustered anterior left upper lobe nodules including at maximally 1.4 cm on image 109/3, new. Musculoskeletal: No acute osseous abnormality. CT ABDOMEN PELVIS FINDINGS Hepatobiliary: Normal liver. Normal gallbladder, without biliary ductal dilatation. Pancreas: Normal, without mass or ductal dilatation. Spleen: Normal in size, without focal abnormality. Adrenals/Urinary Tract: Normal right adrenal gland. Similar mild left adrenal  thickening. Subtle heterogeneous enhancement of both kidneys on delayed images. Example laterally on the right on image 15/9 relatively diffusely on the left on image 24/9. No hydronephrosis. Normal urinary bladder. Stomach/Bowel: Normal stomach, without wall thickening. Colonic stool burden suggests constipation. Normal small bowel. Vascular/Lymphatic: Separate origins of the common hepatic and splenic arteries. Advanced aortic and branch vessel atherosclerosis. Right femoral venous line terminating within the IVC just inferior to the hepatic veins. No abdominopelvic adenopathy. Reproductive: Normal prostate. Other: No significant free fluid. Paucity of intra-abdominal fat. No gross peritoneal/omental metastasis. Musculoskeletal: Sclerotic lesions within the pelvis are unchanged and likely bone islands. IMPRESSION: 1. Response to therapy of right upper lobe primary and thoracic nodal metastasis. 2. Progressive upper lobe predominant peribronchovascular nodularity, likely related to infection, including atypical etiologies. 3. No acute process or evidence of metastatic disease in the abdomen or pelvis. 4. Subtle heterogeneous enhancement of the kidneys on delayed images. Correlate with symptoms to suggest pyelonephritis. 5. Possible constipation. 6. Aortic atherosclerosis (ICD10-I70.0) and emphysema (ICD10-J43.9). Electronically Signed   By: Abigail Miyamoto M.D.   On: 05/31/2018 11:31   Ct Abdomen Pelvis W Contrast  Result Date: 05/31/2018 CLINICAL DATA:  Lung cancer diagnosed in October. Recently quit smoking. EXAM: CT CHEST, ABDOMEN, AND PELVIS WITH CONTRAST TECHNIQUE: Multidetector CT imaging of the chest, abdomen and pelvis was performed following the standard protocol during bolus administration of intravenous contrast. CONTRAST:  15mL ISOVUE-300 IOPAMIDOL (ISOVUE-300) INJECTION 61% COMPARISON:  03/09/2018 chest abdomen and pelvic CTs. 03/18/2018 PET. FINDINGS: CT CHEST FINDINGS Cardiovascular: Aortic  atherosclerosis. Normal heart size, without pericardial effusion. No central pulmonary embolism, on this non-dedicated study. SVC occlusion and collateral veins about the chest wall again identified. Mediastinum/Nodes: No supraclavicular adenopathy. Confluent right paratracheal adenopathy measures 2.1 x 1.7 cm on image 25/2 versus 3.2 x 2.3 cm on the prior. The right hilar adenopathy has resolved, with only mild soft  tissue thickening remaining on image 29/2. Lungs/Pleura: No pleural fluid. Moderate to marked centrilobular emphysema with right greater than left bronchial wall thickening. Spiculated pleural-based right upper lobe pulmonary nodule measures 1.6 x 1.4 cm on image 32/3 versus 2.1 x 2.0 cm on the prior. Bilateral upper lobe predominant peribronchovascular nodularity is significantly progressive compared to 03/09/2018. Some of the areas of nodularity have a "tree-in-bud" morphology. Suspect mucoid impaction in the right upper lobe on image 74/3. Clustered anterior left upper lobe nodules including at maximally 1.4 cm on image 109/3, new. Musculoskeletal: No acute osseous abnormality. CT ABDOMEN PELVIS FINDINGS Hepatobiliary: Normal liver. Normal gallbladder, without biliary ductal dilatation. Pancreas: Normal, without mass or ductal dilatation. Spleen: Normal in size, without focal abnormality. Adrenals/Urinary Tract: Normal right adrenal gland. Similar mild left adrenal thickening. Subtle heterogeneous enhancement of both kidneys on delayed images. Example laterally on the right on image 15/9 relatively diffusely on the left on image 24/9. No hydronephrosis. Normal urinary bladder. Stomach/Bowel: Normal stomach, without wall thickening. Colonic stool burden suggests constipation. Normal small bowel. Vascular/Lymphatic: Separate origins of the common hepatic and splenic arteries. Advanced aortic and branch vessel atherosclerosis. Right femoral venous line terminating within the IVC just inferior to the  hepatic veins. No abdominopelvic adenopathy. Reproductive: Normal prostate. Other: No significant free fluid. Paucity of intra-abdominal fat. No gross peritoneal/omental metastasis. Musculoskeletal: Sclerotic lesions within the pelvis are unchanged and likely bone islands. IMPRESSION: 1. Response to therapy of right upper lobe primary and thoracic nodal metastasis. 2. Progressive upper lobe predominant peribronchovascular nodularity, likely related to infection, including atypical etiologies. 3. No acute process or evidence of metastatic disease in the abdomen or pelvis. 4. Subtle heterogeneous enhancement of the kidneys on delayed images. Correlate with symptoms to suggest pyelonephritis. 5. Possible constipation. 6. Aortic atherosclerosis (ICD10-I70.0) and emphysema (ICD10-J43.9). Electronically Signed   By: Abigail Miyamoto M.D.   On: 05/31/2018 11:31    Assessment and plan- Patient is a 54 y.o. male diagnosed with adenocarcinoma of the right lung stage IIIb who presents to symptom management clinic for symptoms of SVC syndrome r/t underlying lung cancer, which have improved since his diagnosis and concurrent chemo (4 cycles of cisplatin and Alimta) and radiation.    Recent imaging showed overall decrease in size of right upper lobe mass as well as mediastinal and hilar adenopathy consistent with partial response.  Currently on break from treatment due to AKI and weight loss.  Imaging also showed atypical infection.  Currently on Levaquin with improvement in symptoms.  Swelling today consistent with grade 1-2.  Discussed today that swelling secondary to residual mass and continued treatment will likely yield improvement.  He continues anticoagulation. Discussed diuretics or steroids could be considered but given his electrolytes, poor oral intake, and actively being treated for infection I would not recommend. If symptoms worsen, would consider doppler and evaluation by vascular.   Kidney function worse today.  Cr 2.05. Magnesium borderline low. Potassium improved. Encouraged increased oral fluid intake and will give IV fluids today.  Will give 2g in clinic with IV fluids.   Follow up with Dr. Janese Banks as scheduled.    Visit Diagnosis 1. Malignant neoplasm of upper lobe of right lung ([place])   2. SVC syndrome   3. Elevated serum creatinine     Patient expressed understanding and was in agreement with this plan. He also understands that He can call clinic at any time with any questions, concerns, or complaints.   Thank you for allowing me to participate  in the care of this very pleasant patient.   Beckey Rutter, DNP, AGNP-C [place] at [facility] [phone] (work cell) [phone] (office)  CC: Dr. Janese Banks

## 2018-06-08 NOTE — Progress Notes (Signed)
Patient here today to be evaluated in the symptom management clinic for facial and arm swelling that started Saturday afternoon. He noticed that it gets worse with increased activity. He has a productive cough and is currently on Levaquin. He denies having any pain. He states that when he has a coughing spell, he becomes dizzy and sometimes short of breath.

## 2018-06-10 ENCOUNTER — Encounter: Payer: Self-pay | Admitting: Pharmacy Technician

## 2018-06-10 ENCOUNTER — Encounter: Payer: Self-pay | Admitting: Nurse Practitioner

## 2018-06-10 DIAGNOSIS — R7989 Other specified abnormal findings of blood chemistry: Secondary | ICD-10-CM | POA: Insufficient documentation

## 2018-06-10 NOTE — Progress Notes (Signed)
Patient has been approved for drug assistance by [facility] for Imfinzi. The enrollment period is from 06/03/18-06/04/19 based on selfpay.  First DOS covered is 06/03/18.

## 2018-06-17 ENCOUNTER — Inpatient Hospital Stay: Payer: Medicaid Other

## 2018-06-17 ENCOUNTER — Inpatient Hospital Stay (HOSPITAL_BASED_OUTPATIENT_CLINIC_OR_DEPARTMENT_OTHER): Payer: Medicaid Other | Admitting: Oncology

## 2018-06-17 ENCOUNTER — Encounter: Payer: Self-pay | Admitting: Oncology

## 2018-06-17 VITALS — BP 117/78 | HR 91 | Resp 20

## 2018-06-17 VITALS — BP 112/71 | HR 121 | Temp 97.7°F | Resp 20 | Wt 131.2 lb

## 2018-06-17 DIAGNOSIS — C3411 Malignant neoplasm of upper lobe, right bronchus or lung: Secondary | ICD-10-CM

## 2018-06-17 DIAGNOSIS — I871 Compression of vein: Secondary | ICD-10-CM | POA: Diagnosis not present

## 2018-06-17 DIAGNOSIS — Z5112 Encounter for antineoplastic immunotherapy: Secondary | ICD-10-CM | POA: Diagnosis not present

## 2018-06-17 DIAGNOSIS — E876 Hypokalemia: Secondary | ICD-10-CM

## 2018-06-17 DIAGNOSIS — R7989 Other specified abnormal findings of blood chemistry: Secondary | ICD-10-CM

## 2018-06-17 DIAGNOSIS — N179 Acute kidney failure, unspecified: Secondary | ICD-10-CM | POA: Diagnosis not present

## 2018-06-17 DIAGNOSIS — Z7901 Long term (current) use of anticoagulants: Secondary | ICD-10-CM

## 2018-06-17 LAB — CBC WITH DIFFERENTIAL/PLATELET
Abs Immature Granulocytes: 0.07 10*3/uL (ref 0.00–0.07)
Basophils Absolute: 0.1 10*3/uL (ref 0.0–0.1)
Basophils Relative: 1 %
EOS ABS: 0.1 10*3/uL (ref 0.0–0.5)
Eosinophils Relative: 1 %
HEMATOCRIT: 30.4 % — AB (ref 39.0–52.0)
Hemoglobin: 9.8 g/dL — ABNORMAL LOW (ref 13.0–17.0)
Immature Granulocytes: 1 %
Lymphocytes Relative: 8 %
Lymphs Abs: 0.8 10*3/uL (ref 0.7–4.0)
MCH: 30.5 pg (ref 26.0–34.0)
MCHC: 32.2 g/dL (ref 30.0–36.0)
MCV: 94.7 fL (ref 80.0–100.0)
Monocytes Absolute: 0.9 10*3/uL (ref 0.1–1.0)
Monocytes Relative: 9 %
Neutro Abs: 7.8 10*3/uL — ABNORMAL HIGH (ref 1.7–7.7)
Neutrophils Relative %: 80 %
Platelets: 207 10*3/uL (ref 150–400)
RBC: 3.21 MIL/uL — ABNORMAL LOW (ref 4.22–5.81)
RDW: 17.8 % — ABNORMAL HIGH (ref 11.5–15.5)
WBC: 9.9 10*3/uL (ref 4.0–10.5)
nRBC: 0 % (ref 0.0–0.2)

## 2018-06-17 LAB — COMPREHENSIVE METABOLIC PANEL
ALBUMIN: 3.4 g/dL — AB (ref 3.5–5.0)
ALT: 11 U/L (ref 0–44)
AST: 23 U/L (ref 15–41)
Alkaline Phosphatase: 87 U/L (ref 38–126)
Anion gap: 9 (ref 5–15)
BUN: 15 mg/dL (ref 6–20)
CO2: 26 mmol/L (ref 22–32)
Calcium: 8.6 mg/dL — ABNORMAL LOW (ref 8.9–10.3)
Chloride: 103 mmol/L (ref 98–111)
Creatinine, Ser: 1.25 mg/dL — ABNORMAL HIGH (ref 0.61–1.24)
GFR calc Af Amer: >60 mL/min (ref >60)
GFR calc non Af Amer: >60 mL/min (ref >60)
Glucose, Bld: 120 mg/dL — ABNORMAL HIGH (ref 70–99)
Potassium: 3.7 mmol/L (ref 3.5–5.1)
Sodium: 138 mmol/L (ref 135–145)
Total Bilirubin: 0.4 mg/dL (ref 0.3–1.2)
Total Protein: 6.6 g/dL (ref 6.5–8.1)

## 2018-06-17 MED ORDER — SODIUM CHLORIDE 0.9% FLUSH
10.0000 mL | INTRAVENOUS | Status: DC | PRN
  Administered 2018-06-17: 10 mL via INTRAVENOUS
  Filled 2018-06-17: qty 10

## 2018-06-17 MED ORDER — SODIUM CHLORIDE 0.9 % IV SOLN
600.0000 mg | Freq: Once | INTRAVENOUS | Status: AC
  Administered 2018-06-17: 600 mg via INTRAVENOUS
  Filled 2018-06-17: qty 12

## 2018-06-17 MED ORDER — SODIUM CHLORIDE 0.9 % IV SOLN
Freq: Once | INTRAVENOUS | Status: AC
  Administered 2018-06-17: 12:00:00 via INTRAVENOUS
  Filled 2018-06-17: qty 250

## 2018-06-17 MED ORDER — SODIUM CHLORIDE 0.9 % IV SOLN
Freq: Once | INTRAVENOUS | Status: AC
  Administered 2018-06-17: 1000 mL via INTRAVENOUS
  Filled 2018-06-17: qty 250

## 2018-06-17 MED ORDER — HEPARIN SOD (PORK) LOCK FLUSH 100 UNIT/ML IV SOLN
500.0000 [IU] | Freq: Once | INTRAVENOUS | Status: AC
  Administered 2018-06-17: 500 [IU] via INTRAVENOUS
  Filled 2018-06-17: qty 5

## 2018-06-17 MED ORDER — SODIUM CHLORIDE 0.9 % IV SOLN
620.0000 mg | Freq: Once | INTRAVENOUS | Status: DC
  Filled 2018-06-17: qty 12.4

## 2018-06-17 NOTE — Progress Notes (Signed)
Hematology/Oncology Consult note [facility]  Telephone:([phone] [phone]  Patient Care Team: Patient, No Pcp Per as PCP - General ([place]) Telford Nab, RN as Registered Nurse   Name of the patient: Alexander [name]  [id number]  [birth year]   Date of visit: 06/17/18  Diagnosis-  lung adenocarcinoma Stage IIIB cT3 cN2 cM0  Chief complaint/ Reason for visit-on treatment assessment prior to cycle 1 of maintenance Imfinzi  Heme/Onc history: patient is a 53 year old male with a long-standing history of smoking. He has smoked 1 to 1/2 pack of cigarettes per day for over 30 years. He presented to outside urgent care with some symptoms of shortness of breath and was treated for URI. He subsequently presented with facial and neck swelling which was thought by outside ER to be secondary to a drug reaction. Following that patient came to ER here at [facility].  He underwent CT chest abdomen and pelvis which showed 2.1 x 2 cm irregular mass in the right upper lobe concerning for malignancy.. Multiple collateral veins in chest and abdomen probable thrombosis of the left brachiocephalic vein. This is consistent with IVC syndrome Probable right paratracheal right hilar and precarinal adenopathy resulting in severe narrowing and thrombosis of SVC. This is consistent with SVC syndrome  Patient was seen by vascular surgery and there was no acute need for any endovascular therapies. He is on anticoagulation with Coumadin which has been subsequently changed to Eliquis as an outpatient. Patient initially underwent a bronchoscopy guided biopsy of the mediastinal lymph nodes which was inconclusive and underwent a second IR guided biopsy of the right upper lobe lung mass which was consistent with adenocarcinoma  Patient started radiation treatment on 03/15/2018. Plan is to give 4 cycles of cisplatin and Alimta followed by maintenance durvalumab. Cycle 1  of cisplatin Alimta given on 03/16/2018  Interval history-feels better since he completed his antibiotic course.  He is trying to eat and drink more.  Neck swelling is improved.  Denies any pain nausea or vomiting.1  ECOG PS- 1 Pain scale- 0 Opioid associated constipation- no  Review of systems- Review of Systems  Constitutional: Positive for malaise/fatigue. Negative for chills, fever and weight loss.  HENT: Negative for congestion, ear discharge and nosebleeds.   Eyes: Negative for blurred vision.  Respiratory: Negative for cough, hemoptysis, sputum production, shortness of breath and wheezing.   Cardiovascular: Negative for chest pain, palpitations, orthopnea and claudication.  Gastrointestinal: Negative for abdominal pain, blood in stool, constipation, diarrhea, heartburn, melena, nausea and vomiting.  Genitourinary: Negative for dysuria, flank pain, frequency, hematuria and urgency.  Musculoskeletal: Negative for back pain, joint pain and myalgias.  Skin: Negative for rash.  Neurological: Negative for dizziness, tingling, focal weakness, seizures, weakness and headaches.  Endo/Heme/Allergies: Does not bruise/bleed easily.  Psychiatric/Behavioral: Negative for depression and suicidal ideas. The patient does not have insomnia.        Allergies  Allergen Reactions  . Doxycycline Swelling  . Penicillins      Past Medical History:  Diagnosis Date  . Cancer ([place])   . COPD (chronic obstructive pulmonary disease) ([place])   . Lung mass      Past Surgical History:  Procedure Laterality Date  . ENDOBRONCHIAL ULTRASOUND N/A 03/11/2018   Procedure: ENDOBRONCHIAL ULTRASOUND;  Surgeon: Tyler Pita, MD;  Location: [facility] ORS;  Service: Cardiopulmonary;  Laterality: N/A;  . none    . PORTA CATH INSERTION N/A 03/12/2018   Procedure: PORTA CATH INSERTION, thigh;  Surgeon:  Schnier, Dolores Lory, MD;  Location: [facility];  Service: Cardiovascular;  Laterality: N/A;     Social History   Socioeconomic History  . Marital status: Divorced    Spouse name: Not on file  . Number of children: Not on file  . Years of education: Not on file  . Highest education level: Not on file  Occupational History  . Occupation: Cabin crew  Social Needs  . Financial resource strain: Not on file  . Food insecurity:    Worry: Not on file    Inability: Not on file  . Transportation needs:    Medical: Not on file    Non-medical: Not on file  Tobacco Use  . Smoking status: Current Every Day Smoker    Packs/day: 0.25  . Smokeless tobacco: Never Used  Substance and Sexual Activity  . Alcohol use: Not Currently  . Drug use: Yes    Types: Marijuana  . Sexual activity: Not Currently  Lifestyle  . Physical activity:    Days per week: Not on file    Minutes per session: Not on file  . Stress: Not on file  Relationships  . Social connections:    Talks on phone: Not on file    Gets together: Not on file    Attends religious service: Not on file    Active member of club or organization: Not on file    Attends meetings of clubs or organizations: Not on file    Relationship status: Not on file  . Intimate partner violence:    Fear of current or ex partner: Not on file    Emotionally abused: Not on file    Physically abused: Not on file    Forced sexual activity: Not on file  Other Topics Concern  . Not on file  Social History Narrative  . Not on file    History reviewed. No pertinent family history.   Current Outpatient Medications:  .  apixaban (ELIQUIS) 5 MG TABS tablet, Take 1 tablet (5 mg total) by mouth 2 (two) times daily., Disp: 60 tablet, Rfl: 3 .  guaiFENesin (MUCINEX) 600 MG 12 hr tablet, Take 600 mg by mouth 2 (two) times daily., Disp: , Rfl:  .  pantoprazole (PROTONIX) 20 MG tablet, Take 1 tablet (20 mg total) by mouth daily., Disp: 30 tablet, Rfl: 2 .  potassium chloride SA (K-DUR,KLOR-CON) 20 MEQ tablet, Take 1 tablet (20 mEq total) by  mouth daily., Disp: 7 tablet, Rfl: 0 .  oxyCODONE-acetaminophen (PERCOCET/ROXICET) 5-325 MG tablet, Take 1-2 tablets by mouth every 4 (four) hours as needed for moderate pain. (Patient not taking: Reported on 06/17/2018), Disp: 30 tablet, Rfl: 0 No current facility-administered medications for this visit.   Facility-Administered Medications Ordered in Other Visits:  .  sodium chloride flush (NS) 0.9 % injection 10 mL, 10 mL, Intravenous, PRN, Sindy Guadeloupe, MD, 10 mL at 06/17/18 0917  Physical exam:  Vitals:   06/17/18 0949  BP: 112/71  Pulse: (!) 121  Resp: 20  Temp: 97.7 F (36.5 C)  TempSrc: Oral  Weight: 131 lb 3.2 oz (59.5 kg)   Physical Exam HENT:     Head: Normocephalic and atraumatic.  Eyes:     Pupils: Pupils are equal, round, and reactive to light.  Neck:     Musculoskeletal: Normal range of motion.  Cardiovascular:     Rate and Rhythm: Normal rate and regular rhythm.     Heart sounds: Normal heart sounds.  Pulmonary:  Effort: Pulmonary effort is normal.     Comments: Scattered bilateral wheezing Abdominal:     General: Bowel sounds are normal.     Palpations: Abdomen is soft.  Skin:    General: Skin is warm and dry.  Neurological:     Mental Status: He is alert and oriented to person, place, and time.      CMP Latest Ref Rng & Units 06/17/2018  Glucose 70 - 99 mg/dL 120(H)  BUN 6 - 20 mg/dL 15  Creatinine 0.61 - 1.24 mg/dL 1.25(H)  Sodium 135 - 145 mmol/L 138  Potassium 3.5 - 5.1 mmol/L 3.7  Chloride 98 - 111 mmol/L 103  CO2 22 - 32 mmol/L 26  Calcium 8.9 - 10.3 mg/dL 8.6(L)  Total Protein 6.5 - 8.1 g/dL 6.6  Total Bilirubin 0.3 - 1.2 mg/dL 0.4  Alkaline Phos 38 - 126 U/L 87  AST 15 - 41 U/L 23  ALT 0 - 44 U/L 11   CBC Latest Ref Rng & Units 06/17/2018  WBC 4.0 - 10.5 K/uL 9.9  Hemoglobin 13.0 - 17.0 g/dL 9.8(L)  Hematocrit 39.0 - 52.0 % 30.4(L)  Platelets 150 - 400 K/uL 207    No images are attached to the encounter.  Ct Chest W  Contrast  Result Date: 05/31/2018 CLINICAL DATA:  Lung cancer diagnosed in October. Recently quit smoking. EXAM: CT CHEST, ABDOMEN, AND PELVIS WITH CONTRAST TECHNIQUE: Multidetector CT imaging of the chest, abdomen and pelvis was performed following the standard protocol during bolus administration of intravenous contrast. CONTRAST:  40mL ISOVUE-300 IOPAMIDOL (ISOVUE-300) INJECTION 61% COMPARISON:  03/09/2018 chest abdomen and pelvic CTs. 03/18/2018 PET. FINDINGS: CT CHEST FINDINGS Cardiovascular: Aortic atherosclerosis. Normal heart size, without pericardial effusion. No central pulmonary embolism, on this non-dedicated study. SVC occlusion and collateral veins about the chest wall again identified. Mediastinum/Nodes: No supraclavicular adenopathy. Confluent right paratracheal adenopathy measures 2.1 x 1.7 cm on image 25/2 versus 3.2 x 2.3 cm on the prior. The right hilar adenopathy has resolved, with only mild soft tissue thickening remaining on image 29/2. Lungs/Pleura: No pleural fluid. Moderate to marked centrilobular emphysema with right greater than left bronchial wall thickening. Spiculated pleural-based right upper lobe pulmonary nodule measures 1.6 x 1.4 cm on image 32/3 versus 2.1 x 2.0 cm on the prior. Bilateral upper lobe predominant peribronchovascular nodularity is significantly progressive compared to 03/09/2018. Some of the areas of nodularity have a "tree-in-bud" morphology. Suspect mucoid impaction in the right upper lobe on image 74/3. Clustered anterior left upper lobe nodules including at maximally 1.4 cm on image 109/3, new. Musculoskeletal: No acute osseous abnormality. CT ABDOMEN PELVIS FINDINGS Hepatobiliary: Normal liver. Normal gallbladder, without biliary ductal dilatation. Pancreas: Normal, without mass or ductal dilatation. Spleen: Normal in size, without focal abnormality. Adrenals/Urinary Tract: Normal right adrenal gland. Similar mild left adrenal thickening. Subtle heterogeneous  enhancement of both kidneys on delayed images. Example laterally on the right on image 15/9 relatively diffusely on the left on image 24/9. No hydronephrosis. Normal urinary bladder. Stomach/Bowel: Normal stomach, without wall thickening. Colonic stool burden suggests constipation. Normal small bowel. Vascular/Lymphatic: Separate origins of the common hepatic and splenic arteries. Advanced aortic and branch vessel atherosclerosis. Right femoral venous line terminating within the IVC just inferior to the hepatic veins. No abdominopelvic adenopathy. Reproductive: Normal prostate. Other: No significant free fluid. Paucity of intra-abdominal fat. No gross peritoneal/omental metastasis. Musculoskeletal: Sclerotic lesions within the pelvis are unchanged and likely bone islands. IMPRESSION: 1. Response to therapy of right upper lobe  primary and thoracic nodal metastasis. 2. Progressive upper lobe predominant peribronchovascular nodularity, likely related to infection, including atypical etiologies. 3. No acute process or evidence of metastatic disease in the abdomen or pelvis. 4. Subtle heterogeneous enhancement of the kidneys on delayed images. Correlate with symptoms to suggest pyelonephritis. 5. Possible constipation. 6. Aortic atherosclerosis (ICD10-I70.0) and emphysema (ICD10-J43.9). Electronically Signed   By: Abigail Miyamoto M.D.   On: 05/31/2018 11:31   Ct Abdomen Pelvis W Contrast  Result Date: 05/31/2018 CLINICAL DATA:  Lung cancer diagnosed in October. Recently quit smoking. EXAM: CT CHEST, ABDOMEN, AND PELVIS WITH CONTRAST TECHNIQUE: Multidetector CT imaging of the chest, abdomen and pelvis was performed following the standard protocol during bolus administration of intravenous contrast. CONTRAST:  36mL ISOVUE-300 IOPAMIDOL (ISOVUE-300) INJECTION 61% COMPARISON:  03/09/2018 chest abdomen and pelvic CTs. 03/18/2018 PET. FINDINGS: CT CHEST FINDINGS Cardiovascular: Aortic atherosclerosis. Normal heart size,  without pericardial effusion. No central pulmonary embolism, on this non-dedicated study. SVC occlusion and collateral veins about the chest wall again identified. Mediastinum/Nodes: No supraclavicular adenopathy. Confluent right paratracheal adenopathy measures 2.1 x 1.7 cm on image 25/2 versus 3.2 x 2.3 cm on the prior. The right hilar adenopathy has resolved, with only mild soft tissue thickening remaining on image 29/2. Lungs/Pleura: No pleural fluid. Moderate to marked centrilobular emphysema with right greater than left bronchial wall thickening. Spiculated pleural-based right upper lobe pulmonary nodule measures 1.6 x 1.4 cm on image 32/3 versus 2.1 x 2.0 cm on the prior. Bilateral upper lobe predominant peribronchovascular nodularity is significantly progressive compared to 03/09/2018. Some of the areas of nodularity have a "tree-in-bud" morphology. Suspect mucoid impaction in the right upper lobe on image 74/3. Clustered anterior left upper lobe nodules including at maximally 1.4 cm on image 109/3, new. Musculoskeletal: No acute osseous abnormality. CT ABDOMEN PELVIS FINDINGS Hepatobiliary: Normal liver. Normal gallbladder, without biliary ductal dilatation. Pancreas: Normal, without mass or ductal dilatation. Spleen: Normal in size, without focal abnormality. Adrenals/Urinary Tract: Normal right adrenal gland. Similar mild left adrenal thickening. Subtle heterogeneous enhancement of both kidneys on delayed images. Example laterally on the right on image 15/9 relatively diffusely on the left on image 24/9. No hydronephrosis. Normal urinary bladder. Stomach/Bowel: Normal stomach, without wall thickening. Colonic stool burden suggests constipation. Normal small bowel. Vascular/Lymphatic: Separate origins of the common hepatic and splenic arteries. Advanced aortic and branch vessel atherosclerosis. Right femoral venous line terminating within the IVC just inferior to the hepatic veins. No abdominopelvic  adenopathy. Reproductive: Normal prostate. Other: No significant free fluid. Paucity of intra-abdominal fat. No gross peritoneal/omental metastasis. Musculoskeletal: Sclerotic lesions within the pelvis are unchanged and likely bone islands. IMPRESSION: 1. Response to therapy of right upper lobe primary and thoracic nodal metastasis. 2. Progressive upper lobe predominant peribronchovascular nodularity, likely related to infection, including atypical etiologies. 3. No acute process or evidence of metastatic disease in the abdomen or pelvis. 4. Subtle heterogeneous enhancement of the kidneys on delayed images. Correlate with symptoms to suggest pyelonephritis. 5. Possible constipation. 6. Aortic atherosclerosis (ICD10-I70.0) and emphysema (ICD10-J43.9). Electronically Signed   By: Abigail Miyamoto M.D.   On: 05/31/2018 11:31     Assessment and plan- Patient is a 53 y.o. male adenocarcinoma of the right lung stage IIIb CT 3 N2 M0 presenting as SVC syndrome status post concurrent chemoradiation and 4 cycles of cisplatin and Alimta.  He is here for on treatment assessment prior to cycle 1 of maintenance durvalumab  Patient's kidney functions have improved after IV fluid hydration.  Creatinine is mildly still elevated at 1.1 I will give him 1 L of extra IV fluids today.  Symptomatically he feels better after a course of antibiotics.  Neck swelling is also improved.  He has had partial response after concurrent chemoradiation and counts otherwise okay to proceed with cycle 1 of maintenance durvalumab today.  I will see him back in 2 weeks time with CBC and CMP prior to cycle 2.    Visit Diagnosis 1. Malignant neoplasm of upper lobe of right lung ([place])   2. Encounter for antineoplastic immunotherapy   3. AKI (acute kidney injury) ([place])      Dr. Randa Evens, MD, MPH [facility] [phone] 06/17/2018 3:21 PM

## 2018-06-17 NOTE — Progress Notes (Signed)
SVC syndrome. Facial swelling both sides has swelling in both arms Left arm has more fluid. Sleeping with HPB elevated 30 degrees. This helps with breathing and cough. URI symptoms lingering with cough and productive sputum which is clear or creamy. Appetite is improving. Eating something 3x per day. Taste is improving.Energy is better. Here to start immunotherapy today.

## 2018-06-25 ENCOUNTER — Ambulatory Visit
Admission: RE | Admit: 2018-06-25 | Discharge: 2018-06-25 | Disposition: A | Discharge status: Home / Self Care | Payer: Medicaid Other | Source: Ambulatory Visit | Attending: Radiation Oncology | Admitting: Radiation Oncology

## 2018-06-25 ENCOUNTER — Encounter: Payer: Self-pay | Admitting: Radiation Oncology

## 2018-06-25 ENCOUNTER — Other Ambulatory Visit: Payer: Self-pay

## 2018-06-25 DIAGNOSIS — Z923 Personal history of irradiation: Secondary | ICD-10-CM | POA: Diagnosis not present

## 2018-06-25 DIAGNOSIS — C3491 Malignant neoplasm of unspecified part of right bronchus or lung: Secondary | ICD-10-CM | POA: Diagnosis not present

## 2018-06-25 DIAGNOSIS — C3411 Malignant neoplasm of upper lobe, right bronchus or lung: Secondary | ICD-10-CM

## 2018-06-25 NOTE — Progress Notes (Signed)
Radiation Oncology Follow up Note  Name: Derrel Moore   Date:   06/25/2018 MRN:  [id number] DOB: 08/20/1965    This 53 y.o. male presents to the clinic today for one-month follow-up status post concurrent chemoradiation therapy for stage IIIB adenocarcinoma of the right lung.  REFERRING PROVIDER: No ref. provider found  HPI: patient is a 53 year old male now out 1 month having completed concurrent chemoradiation for stage IIIB (T3 N2 M0) adenocarcinoma the right lung presenting with a superior vena cava syndrome. He is seen today in routine follow-up and is doing fairly well. He does have a monitor cough and some swelling in his left and right upper extremity. Also has some venous jugular distention in his neck. He specifically denies cough hemoptysis or chest tightness. He is currently on.and just startedcycle 1 of maintenance Imfinzi.he had a recent CT scan early in January showing response to therapy in response to his right upper lobe primary and thoracic nodal metastasis. He did have some upper lobe peribronchial nodularity most likely related to infection.  COMPLICATIONS OF TREATMENT: none  FOLLOW UP COMPLIANCE: keeps appointments   PHYSICAL EXAM:  BP (P) 129/89 (BP Location: Right Arm, Patient Position: Sitting)   Pulse (!) (P) 119   Temp (!) (P) 96.7 F (35.9 C) (Tympanic)   Resp (P) 18   Wt (P) 131 lb 8.1 oz (59.6 kg)   BMI (P) 19.42 kg/m  Patient has significant venous jugular distention his neck. Some mild swelling of his right lateral upper extremities.Well-developed well-nourished patient in NAD. HEENT reveals PERLA, EOMI, discs not visualized.  Oral cavity is clear. No oral mucosal lesions are identified. Neck is clear without evidence of cervical or supraclavicular adenopathy. Lungs are clear to A&P. Cardiac examination is essentially unremarkable with regular rate and rhythm without murmur rub or thrill. Abdomen is benign with no organomegaly or masses noted. Motor sensory  and DTR levels are equal and symmetric in the upper and lower extremities. Cranial nerves II through XII are grossly intact. Proprioception is intact. No peripheral adenopathy or edema is identified. No motor or sensory levels are noted. Crude visual fields are within normal range.  RADIOLOGY RESULTS: CT scans are reviewed and compatible with the above-stated findings  PLAN: resent time patient is stable with good response to concurrent chemoradiation. He currently is on immunotherapy and started his first cycle. I am please was overall progress. I've asked to see him back in 3-4 months for follow-up. Be happy to reevaluate him at any time should further palliative treatment be indicated.  I would like to take this opportunity to thank you for allowing me to participate in the care of your patient.Noreene Filbert, MD

## 2018-06-29 ENCOUNTER — Emergency Department
Admission: EM | Admit: 2018-06-29 | Discharge: 2018-06-30 | Disposition: A | Discharge status: Other Acute Inpatient Hospital | Payer: Medicaid Other | Attending: Emergency Medicine | Admitting: Emergency Medicine

## 2018-06-29 ENCOUNTER — Telehealth: Payer: Self-pay | Admitting: Oncology

## 2018-06-29 ENCOUNTER — Emergency Department: Payer: Medicaid Other

## 2018-06-29 ENCOUNTER — Emergency Department
Admit: 2018-06-29 | Discharge: 2018-06-29 | Disposition: A | Discharge status: Home / Self Care | Payer: Medicaid Other | Attending: Emergency Medicine | Admitting: Emergency Medicine

## 2018-06-29 ENCOUNTER — Encounter: Payer: Self-pay | Admitting: Emergency Medicine

## 2018-06-29 ENCOUNTER — Other Ambulatory Visit: Payer: Self-pay

## 2018-06-29 DIAGNOSIS — R079 Chest pain, unspecified: Secondary | ICD-10-CM | POA: Diagnosis present

## 2018-06-29 DIAGNOSIS — Z7901 Long term (current) use of anticoagulants: Secondary | ICD-10-CM | POA: Insufficient documentation

## 2018-06-29 DIAGNOSIS — F17210 Nicotine dependence, cigarettes, uncomplicated: Secondary | ICD-10-CM | POA: Diagnosis not present

## 2018-06-29 DIAGNOSIS — I309 Acute pericarditis, unspecified: Secondary | ICD-10-CM

## 2018-06-29 DIAGNOSIS — Z85118 Personal history of other malignant neoplasm of bronchus and lung: Secondary | ICD-10-CM | POA: Insufficient documentation

## 2018-06-29 DIAGNOSIS — I313 Pericardial effusion (noninflammatory): Secondary | ICD-10-CM | POA: Insufficient documentation

## 2018-06-29 DIAGNOSIS — Z79899 Other long term (current) drug therapy: Secondary | ICD-10-CM | POA: Diagnosis not present

## 2018-06-29 LAB — URINALYSIS, COMPLETE (UACMP) WITH MICROSCOPIC
Bacteria, UA: NONE SEEN
Bilirubin Urine: NEGATIVE
Glucose, UA: NEGATIVE mg/dL
Hgb urine dipstick: NEGATIVE
Ketones, ur: NEGATIVE mg/dL
Leukocytes, UA: NEGATIVE
Nitrite: NEGATIVE
PH: 5 (ref 5.0–8.0)
Protein, ur: NEGATIVE mg/dL
Specific Gravity, Urine: >1.046 — ABNORMAL HIGH (ref 1.005–1.030)

## 2018-06-29 LAB — PROCALCITONIN: Procalcitonin: 0.21 ng/mL

## 2018-06-29 LAB — CBC WITH DIFFERENTIAL/PLATELET
Abs Immature Granulocytes: 0.15 10*3/uL — ABNORMAL HIGH (ref 0.00–0.07)
BASOS PCT: 1 %
Basophils Absolute: 0.1 10*3/uL (ref 0.0–0.1)
Eosinophils Absolute: 0 10*3/uL (ref 0.0–0.5)
Eosinophils Relative: 0 %
HCT: 32.3 % — ABNORMAL LOW (ref 39.0–52.0)
Hemoglobin: 10.2 g/dL — ABNORMAL LOW (ref 13.0–17.0)
Immature Granulocytes: 1 %
Lymphocytes Relative: 3 %
Lymphs Abs: 0.5 10*3/uL — ABNORMAL LOW (ref 0.7–4.0)
MCH: 31.2 pg (ref 26.0–34.0)
MCHC: 31.6 g/dL (ref 30.0–36.0)
MCV: 98.8 fL (ref 80.0–100.0)
Monocytes Absolute: 1.3 10*3/uL — ABNORMAL HIGH (ref 0.1–1.0)
Monocytes Relative: 7 %
Neutro Abs: 15.6 10*3/uL — ABNORMAL HIGH (ref 1.7–7.7)
Neutrophils Relative %: 88 %
PLATELETS: 204 10*3/uL (ref 150–400)
RBC: 3.27 MIL/uL — ABNORMAL LOW (ref 4.22–5.81)
RDW: 17 % — ABNORMAL HIGH (ref 11.5–15.5)
WBC: 17.5 10*3/uL — ABNORMAL HIGH (ref 4.0–10.5)
nRBC: 0 % (ref 0.0–0.2)

## 2018-06-29 LAB — COMPREHENSIVE METABOLIC PANEL
ALBUMIN: 3.5 g/dL (ref 3.5–5.0)
ALT: 9 U/L (ref 0–44)
AST: 23 U/L (ref 15–41)
Alkaline Phosphatase: 71 U/L (ref 38–126)
Anion gap: 8 (ref 5–15)
BUN: 18 mg/dL (ref 6–20)
CHLORIDE: 103 mmol/L (ref 98–111)
CO2: 26 mmol/L (ref 22–32)
Calcium: 8.8 mg/dL — ABNORMAL LOW (ref 8.9–10.3)
Creatinine, Ser: 1.28 mg/dL — ABNORMAL HIGH (ref 0.61–1.24)
GFR calc Af Amer: >60 mL/min (ref >60)
GFR calc non Af Amer: >60 mL/min (ref >60)
GLUCOSE: 156 mg/dL — AB (ref 70–99)
Potassium: 5.1 mmol/L (ref 3.5–5.1)
SODIUM: 137 mmol/L (ref 135–145)
Total Bilirubin: 0.7 mg/dL (ref 0.3–1.2)
Total Protein: 6.9 g/dL (ref 6.5–8.1)

## 2018-06-29 LAB — APTT: aPTT: 94 seconds — ABNORMAL HIGH (ref 24–36)

## 2018-06-29 LAB — TROPONIN I: Troponin I: <0.03 ng/mL (ref <0.03)

## 2018-06-29 LAB — PROTIME-INR
INR: 1.15
Prothrombin Time: 14.6 seconds (ref 11.4–15.2)

## 2018-06-29 LAB — MRSA PCR SCREENING: MRSA by PCR: NEGATIVE

## 2018-06-29 LAB — LIPASE, BLOOD: Lipase: 23 U/L (ref 11–51)

## 2018-06-29 LAB — LACTIC ACID, PLASMA: LACTIC ACID, VENOUS: 1.7 mmol/L (ref 0.5–1.9)

## 2018-06-29 MED ORDER — VANCOMYCIN HCL IN DEXTROSE 1-5 GM/200ML-% IV SOLN: 1000.0000 mg | Freq: Once | INTRAVENOUS | Status: DC

## 2018-06-29 MED ORDER — MORPHINE SULFATE (PF) 4 MG/ML IV SOLN
4.0000 mg | Freq: Once | INTRAVENOUS | Status: AC
  Administered 2018-06-29: 4 mg via INTRAVENOUS
  Filled 2018-06-29: qty 1

## 2018-06-29 MED ORDER — SODIUM CHLORIDE 0.9 % IV SOLN
Freq: Once | INTRAVENOUS | Status: AC
  Administered 2018-06-29: 18:00:00 via INTRAVENOUS

## 2018-06-29 MED ORDER — SODIUM CHLORIDE 0.9 % IV BOLUS
1000.0000 mL | Freq: Once | INTRAVENOUS | Status: AC
  Administered 2018-06-29: 1000 mL via INTRAVENOUS

## 2018-06-29 MED ORDER — IOHEXOL 350 MG/ML SOLN
75.0000 mL | Freq: Once | INTRAVENOUS | Status: AC | PRN
  Administered 2018-06-29: 75 mL via INTRAVENOUS

## 2018-06-29 MED ORDER — SODIUM CHLORIDE 0.9 % IV SOLN
2.0000 g | Freq: Once | INTRAVENOUS | Status: AC
  Administered 2018-06-29: 2 g via INTRAVENOUS
  Filled 2018-06-29: qty 2

## 2018-06-29 MED ORDER — VANCOMYCIN HCL 10 G IV SOLR
1500.0000 mg | Freq: Once | INTRAVENOUS | Status: AC
  Administered 2018-06-29: 1500 mg via INTRAVENOUS
  Filled 2018-06-29: qty 1500

## 2018-06-29 NOTE — ED Provider Notes (Signed)
[facility] Emergency Department Provider Note  ____________________________________________  Time seen: Approximately 3:42 PM  I have reviewed the triage vital signs and the nursing notes.   HISTORY  Chief Complaint Chest Pain    HPI Alexander [name] is a 53 y.o. 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  Chest pain is sharp, severe, pleuritic, nonradiating.  No aggravating or alleviating factors.  No cough.  On Eliquis.  No fevers or chills.      Past Medical History:  Diagnosis Date  . Cancer ([place])   . COPD (chronic obstructive pulmonary disease) ([place])   . Lung mass      Patient Active Problem List   Diagnosis Date Noted  . Elevated serum creatinine 06/10/2018  . Goals of care, counseling/discussion 06/03/2018  . Lung cancer ([place]) 03/16/2018  . SVC syndrome 03/09/2018     Past Surgical History:  Procedure Laterality Date  . ENDOBRONCHIAL ULTRASOUND N/A 03/11/2018   Procedure: ENDOBRONCHIAL ULTRASOUND;  Surgeon: Tyler Pita, MD;  Location: [facility] ORS;  Service: Cardiopulmonary;  Laterality: N/A;  . none    . PORTA CATH INSERTION N/A 03/12/2018   Procedure: PORTA CATH INSERTION, thigh;  Surgeon: Katha Cabal, MD;  Location: [facility] CV LAB;  Service: Cardiovascular;  Laterality: N/A;     Prior to Admission medications   Medication Sig Start Date End Date Taking? Authorizing Provider  apixaban (ELIQUIS) 5 MG TABS tablet Take 1 tablet (5 mg total) by mouth 2 (two) times daily. 04/08/18 06/29/18 Yes Sindy Guadeloupe, MD  oxyCODONE-acetaminophen (PERCOCET/ROXICET) 5-325 MG tablet Take 1-2 tablets by mouth every 4 (four) hours as needed for moderate pain. 05/20/18  Yes Sindy Guadeloupe, MD  pantoprazole (PROTONIX) 20 MG tablet Take 1 tablet (20 mg total) by mouth daily. 05/20/18  Yes Sindy Guadeloupe,  MD  potassium chloride SA (K-DUR,KLOR-CON) 20 MEQ tablet Take 1 tablet (20 mEq total) by mouth daily. Patient not taking: Reported on 06/25/2018 06/04/18   Sindy Guadeloupe, MD     Allergies Doxycycline and Penicillins   No family history on file.  Social History Social History   Tobacco Use  . Smoking status: Current Every Day Smoker    Packs/day: 0.25  . Smokeless tobacco: Never Used  Substance Use Topics  . Alcohol use: Not Currently  . Drug use: Yes    Types: Marijuana    Review of Systems  Constitutional:   No fever or chills.  ENT:   No sore throat. No rhinorrhea. Cardiovascular: Positive as above chest pain without syncope. Respiratory:   Positive shortness of breath without cough. Gastrointestinal:   Negative for abdominal pain, vomiting and diarrhea.  Musculoskeletal:   Negative for focal pain or swelling All other systems reviewed and are negative except as documented above in ROS and HPI.  ____________________________________________   PHYSICAL EXAM:  VITAL SIGNS: ED Triage Vitals  Enc Vitals Group     BP 06/29/18 1519 (!) 89/60     Pulse Rate 06/29/18 1519 (!) 138     Resp 06/29/18 1519 (!) 24     Temp --      Temp Source 06/29/18 1519 Oral     SpO2 06/29/18 1519 98 %     Weight --      Height --      Head Circumference --  Peak Flow --      Pain Score 06/29/18 1518 5     Pain Loc --      Pain Edu? --      Excl. in [place]? --     Vital signs reviewed, nursing assessments reviewed.   Constitutional:   Alert and oriented.  Ill-appearing Eyes:   Conjunctivae are normal. EOMI. PERRL. ENT      Head:   Normocephalic and atraumatic.      Nose:   No congestion/rhinnorhea.       Mouth/Throat:   MMM, no pharyngeal erythema. No peritonsillar mass.       Neck:   No meningismus. Full ROM. Hematological/Lymphatic/Immunilogical:   No cervical lymphadenopathy. Cardiovascular:   Tachycardia rate 140. Symmetric bilateral radial and thready DP pulses.  No  murmurs. Cap refill less than 2 seconds. Respiratory:   Positive tachypnea.  No wheezes or crackles.. Gastrointestinal:   Soft and nontender. Non distended. There is no CVA tenderness.  No rebound, rigidity, or guarding. Musculoskeletal:   Normal range of motion in all extremities. No joint effusions.  No lower extremity tenderness.  No edema. Neurologic:   Normal speech and language.  Motor grossly intact. No acute focal neurologic deficits are appreciated.  Skin:    Skin is warm, dry and intact. No rash noted.  No petechiae, purpura, or bullae.  ____________________________________________    LABS (pertinent positives/negatives) (all labs ordered are listed, but only abnormal results are displayed) Labs Reviewed  URINALYSIS, COMPLETE (UACMP) WITH MICROSCOPIC - Abnormal; Notable for the following components:      Result Value   Color, Urine YELLOW (*)    APPearance CLEAR (*)    Specific Gravity, Urine >1.046 (*)    All other components within normal limits  APTT - Abnormal; Notable for the following components:   aPTT 94 (*)    All other components within normal limits  CBC WITH DIFFERENTIAL/PLATELET - Abnormal; Notable for the following components:   WBC 17.5 (*)    RBC 3.27 (*)    Hemoglobin 10.2 (*)    HCT 32.3 (*)    RDW 17.0 (*)    Neutro Abs 15.6 (*)    Lymphs Abs 0.5 (*)    Monocytes Absolute 1.3 (*)    Abs Immature Granulocytes 0.15 (*)    All other components within normal limits  COMPREHENSIVE METABOLIC PANEL - Abnormal; Notable for the following components:   Glucose, Bld 156 (*)    Creatinine, Ser 1.28 (*)    Calcium 8.8 (*)    All other components within normal limits  MRSA PCR SCREENING  CULTURE, BLOOD (ROUTINE X 2)  CULTURE, BLOOD (ROUTINE X 2)  URINE CULTURE  LACTIC ACID, PLASMA  TROPONIN I  PROCALCITONIN  LIPASE, BLOOD  PROTIME-INR  TROPONIN I   ____________________________________________   EKG  Interpreted by me Sinus tachycardia rate 140.   Right axis.  Normal intervals.  Poor R wave progression.  Normal ST segments and T waves.  ____________________________________________    RADIOLOGY  Ct Angio Chest Pe W And/or Wo Contrast  Result Date: 06/29/2018 CLINICAL DATA:  53 y/o M; chest pain and shortness of breath. History of left cancer, last chemotherapy 1 week ago. EXAM: CT ANGIOGRAPHY CHEST WITH CONTRAST TECHNIQUE: Multidetector CT imaging of the chest was performed using the standard protocol during bolus administration of intravenous contrast. Multiplanar CT image reconstructions and MIPs were obtained to evaluate the vascular anatomy. CONTRAST:  37mL OMNIPAQUE IOHEXOL 350 MG/ML SOLN COMPARISON:  05/31/2018 CT chest. 03/18/2018 PET-CT. FINDINGS: Cardiovascular: Satisfactory opacification of the pulmonary arteries to the segmental level. No evidence of pulmonary embolism. Normal heart size. Moderate pericardial effusion. Mild aortic calcific atherosclerosis. SVC occlusion collaterals throughout the mediastinum and chest wall. Mediastinum/Nodes: Ill-defined confluent right lower paratracheal lymphadenopathy is stable. No new mediastinal or axillary adenopathy. Lungs/Pleura: Spiculated right upper lobe pleural based nodule measures 1.6 x 1.4 cm (series 4, image 14). Clustered nodules in bronchovascular "tree-in-bud" distribution are improved compared with the prior study mild residual. No new consolidation, effusion, or pneumothorax. Centrilobular emphysema. Upper Abdomen: No acute abnormality. Musculoskeletal: No chest wall abnormality. No acute or significant osseous findings. Review of the MIP images confirms the above findings. IMPRESSION: 1. No evidence of pulmonary embolus. 2. New moderate pericardial effusion. 3. Stable right upper lobe 1.6 cm spiculated nodule. 4. Interval improvement in tree-in-bud opacities compatible with bronchitis/bronchiolitis. No new consolidation. 5. Aortic Atherosclerosis (ICD10-I70.0) and Emphysema  (ICD10-J43.9). Electronically Signed   By: Kristine Garbe M.D.   On: 06/29/2018 17:34   Dg Chest Port 1 View  Result Date: 06/29/2018 CLINICAL DATA:  Chest pain, shortness of breath. EXAM: PORTABLE CHEST 1 VIEW COMPARISON:  Radiograph of March 12, 2018. FINDINGS: The heart size and mediastinal contours are within normal limits. Both lungs are clear. No pneumothorax or pleural effusion is noted. The visualized skeletal structures are unremarkable. IMPRESSION: No active disease. Electronically Signed   By: Marijo Conception, M.D.   On: 06/29/2018 16:36    ____________________________________________   PROCEDURES .Critical Care Performed by: Carrie Mew, MD Authorized by: Carrie Mew, MD   Critical care provider statement:    Critical care time (minutes):  35   Critical care time was exclusive of:  Separately billable procedures and treating other patients   Critical care was necessary to treat or prevent imminent or life-threatening deterioration of the following conditions:  Circulatory failure and shock   Critical care was time spent personally by me on the following activities:  Development of treatment plan with patient or surrogate, discussions with consultants, evaluation of patient's response to treatment, examination of patient, obtaining history from patient or surrogate, ordering and performing treatments and interventions, ordering and review of laboratory studies, ordering and review of radiographic studies, pulse oximetry, re-evaluation of patient's condition and review of old charts    ____________________________________________  DIFFERENTIAL DIAGNOSIS   Pneumonia/H CAP, pneumothorax, pulmonary edema, pulmonary embolism, aortic dissection  CLINICAL IMPRESSION / ASSESSMENT AND PLAN / ED COURSE  Pertinent labs & imaging results that were available during my care of the patient were reviewed by me and considered in my medical decision making (see chart  for details).      Clinical Course as of Jun 29 2054  Tue Jun 29, 2018  1538 Patient presents with tachypnea tachycardia, borderline blood pressure of 85/55.  Afebrile.  Concerning for H CAP versus PE.  Start a sepsis protocol, empiric cefepime and vancomycin while pursuing work-up with labs, portable chest x-ray, CT angiogram of the chest.  He does have increased work of breathing but is managing his airway just fine right now and does not require intubation.   [PS]  2536 CT reviewed. No PE. Moderate pericardial effusion, which I suspect is causing some tamponade physiology given the symptoms and lack of other findings. Results d/w patient and family, as well as Dr. Tasia Catchings by phone   [PS]  [phone] D/w cards dr Humphrey Rolls, recommends transfer for CT surg eval. Would avoid pericardiocentesis for 2 days until  eliquis has worn off   [PS]  1932 Blood pressure remained stable.  Awaiting callback from [facility] to arrange transfer to [facility] for cardiothoracic surgery evaluation.  Given the SVC syndrome and inability to lay patient into supine or Trendelenburg position and Eliquis, I think pericardiocentesis is too high risk for hemorrhage from the left ventricle or liver unless the patient is overtly hypotensive and decompensating.   [PS]  2001 Discussed with [facility] who advises that Dr. Roxan Hockey of [facility], CT surgery is unavailable in the OR for the next several hours.  They will reach out to cardiology as the next option for advancing care for for this patient and being able to facilitate transfer.   [PS]  2013 Discussed with cardiology fellow Dr. Kalman Shan at [facility] who agrees with transfer, recommends discussion with hospitalist to arrange stepdown bed.   [PS]  2025 Discussed with [facility] intensivist Dr. Lucile Shutters who recommends stat TTE in [facility] ED to confirm that effusion is hemodynamically significant prior to transfer   [PS]  2054 Discussed with house supervisor who advises no acute tech on call  tonight and not in-house.  Discussed with Dr. Yancey Flemings again who advises that due to new equipment he is unable to provide a stat echo for [place].  Reached out to Dr. Lucile Shutters again who accepts to the medical ICU at [facility].   [PS]    Clinical Course User Index [PS] Carrie Mew, MD     ____________________________________________   FINAL CLINICAL IMPRESSION(S) / ED DIAGNOSES    Final diagnoses:  Acute pericardial effusion     ED Discharge Orders    None      Portions of this note were generated with dragon dictation software. Dictation errors may occur despite best attempts at proofreading.   Carrie Mew, MD 06/29/18 2056

## 2018-06-29 NOTE — ED Notes (Signed)
Port accessed by Roy.

## 2018-06-29 NOTE — ED Notes (Signed)
Pt reminded that urine sample is needed. States he will notify RN when he can provide sample.

## 2018-06-29 NOTE — ED Notes (Signed)
Pt WOB dec from earlier tonight but remains somewhat labored. No accessory muscle use or nasal flaring. RA 96%. Calm. Family at bedside.

## 2018-06-29 NOTE — Telephone Encounter (Signed)
Called by ED physician and discuss about patient's case. 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 He was also found to be tachycardia 130s to 140s.  With hypotension 85/55.  Initial concern was H CAP versus PE.  CT angiogram of the chest was negative for PE, showed new moderate pericardial effusion, stable right upper lobe 1.6 cm spiculated nodule. Discussed with Dr. Joni Fears about urgent cardiology/thoracic surgery consultation for evaluation of possible cardiac tamponade, need of emergent pericardial effusion drainage for both diagnostic and therapeutic purpose.  Differential of pericardial effusion radiation-induced versus immunotherapy induced,  infectious or disease progression[which is less likely given the stable size of right upper lobe nodule].

## 2018-06-29 NOTE — ED Triage Notes (Signed)
PT to ED from home with c/o CP and SOB that started today, hx of lung cancer. Last chemo was x1 wk. PT appears pale and tachypnic. Per family hx of SVC. HR 140, BP 89/90.

## 2018-06-29 NOTE — ED Notes (Signed)
Okay per EDP Stafford to pull 2nd set of cultures from R thigh port.

## 2018-06-29 NOTE — ED Notes (Signed)
Pt family updated

## 2018-06-29 NOTE — ED Notes (Signed)
Pt has port in thigh, will not collect blood in triage

## 2018-06-29 NOTE — ED Notes (Signed)
Pt sitting on edge of bed bc his "butt hurts." Pt carefully assisted back into bed. Given more warm blankets.

## 2018-06-29 NOTE — ED Notes (Signed)
IV attempted x1. 2nd RN attempting to access port.

## 2018-06-29 NOTE — Progress Notes (Signed)
CODE SEPSIS - PHARMACY COMMUNICATION  **Broad Spectrum Antibiotics should be administered within 1 hour of Sepsis diagnosis**  Time Code Sepsis Called/Page Received: 1540  Antibiotics Ordered: cefepime and vancomycin  Time of 1st antibiotic administration: 1642  Additional action taken by pharmacy: Messaged RN  If necessary, Name of Provider/Nurse Contacted: [place], [place].    Oswald Hillock ,PharmD, BCPS Clinical Pharmacist  06/29/2018  4:45 PM

## 2018-06-30 ENCOUNTER — Encounter (HOSPITAL_COMMUNITY)
Admission: EM | Disposition: A | Discharge status: Home / Self Care | Payer: Self-pay | Source: Other Acute Inpatient Hospital | Attending: Pulmonary Disease

## 2018-06-30 ENCOUNTER — Inpatient Hospital Stay (HOSPITAL_COMMUNITY): Payer: Medicaid Other

## 2018-06-30 ENCOUNTER — Encounter (HOSPITAL_COMMUNITY): Payer: Self-pay | Admitting: Certified Registered Nurse Anesthetist

## 2018-06-30 ENCOUNTER — Inpatient Hospital Stay (HOSPITAL_COMMUNITY): Payer: Medicaid Other | Admitting: Anesthesiology

## 2018-06-30 ENCOUNTER — Inpatient Hospital Stay (HOSPITAL_COMMUNITY)
Admission: EM | Admit: 2018-06-30 | Discharge: 2018-07-03 | DRG: 270 | Disposition: A | Discharge status: Home / Self Care | Payer: Medicaid Other | Source: Other Acute Inpatient Hospital | Attending: Internal Medicine | Admitting: Internal Medicine

## 2018-06-30 DIAGNOSIS — D630 Anemia in neoplastic disease: Secondary | ICD-10-CM | POA: Diagnosis present

## 2018-06-30 DIAGNOSIS — Z923 Personal history of irradiation: Secondary | ICD-10-CM | POA: Diagnosis not present

## 2018-06-30 DIAGNOSIS — I700 Atherosclerosis of aorta: Secondary | ICD-10-CM | POA: Diagnosis present

## 2018-06-30 DIAGNOSIS — Z8679 Personal history of other diseases of the circulatory system: Secondary | ICD-10-CM

## 2018-06-30 DIAGNOSIS — J9690 Respiratory failure, unspecified, unspecified whether with hypoxia or hypercapnia: Secondary | ICD-10-CM

## 2018-06-30 DIAGNOSIS — D899 Disorder involving the immune mechanism, unspecified: Secondary | ICD-10-CM | POA: Diagnosis present

## 2018-06-30 DIAGNOSIS — Z7901 Long term (current) use of anticoagulants: Secondary | ICD-10-CM | POA: Diagnosis not present

## 2018-06-30 DIAGNOSIS — Z79899 Other long term (current) drug therapy: Secondary | ICD-10-CM | POA: Diagnosis not present

## 2018-06-30 DIAGNOSIS — C3491 Malignant neoplasm of unspecified part of right bronchus or lung: Secondary | ICD-10-CM

## 2018-06-30 DIAGNOSIS — Z881 Allergy status to other antibiotic agents status: Secondary | ICD-10-CM | POA: Diagnosis not present

## 2018-06-30 DIAGNOSIS — Z681 Body mass index (BMI) 19 or less, adult: Secondary | ICD-10-CM | POA: Diagnosis not present

## 2018-06-30 DIAGNOSIS — Z9221 Personal history of antineoplastic chemotherapy: Secondary | ICD-10-CM | POA: Diagnosis not present

## 2018-06-30 DIAGNOSIS — D6481 Anemia due to antineoplastic chemotherapy: Secondary | ICD-10-CM | POA: Diagnosis present

## 2018-06-30 DIAGNOSIS — J9601 Acute respiratory failure with hypoxia: Secondary | ICD-10-CM | POA: Diagnosis not present

## 2018-06-30 DIAGNOSIS — I871 Compression of vein: Secondary | ICD-10-CM | POA: Diagnosis present

## 2018-06-30 DIAGNOSIS — Z95828 Presence of other vascular implants and grafts: Secondary | ICD-10-CM | POA: Diagnosis not present

## 2018-06-30 DIAGNOSIS — R0602 Shortness of breath: Secondary | ICD-10-CM

## 2018-06-30 DIAGNOSIS — N179 Acute kidney failure, unspecified: Secondary | ICD-10-CM | POA: Diagnosis present

## 2018-06-30 DIAGNOSIS — J432 Centrilobular emphysema: Secondary | ICD-10-CM | POA: Diagnosis present

## 2018-06-30 DIAGNOSIS — I313 Pericardial effusion (noninflammatory): Secondary | ICD-10-CM | POA: Diagnosis present

## 2018-06-30 DIAGNOSIS — Z66 Do not resuscitate: Secondary | ICD-10-CM | POA: Diagnosis present

## 2018-06-30 DIAGNOSIS — I3139 Other pericardial effusion (noninflammatory): Secondary | ICD-10-CM | POA: Diagnosis present

## 2018-06-30 DIAGNOSIS — F17210 Nicotine dependence, cigarettes, uncomplicated: Secondary | ICD-10-CM | POA: Diagnosis present

## 2018-06-30 DIAGNOSIS — J9600 Acute respiratory failure, unspecified whether with hypoxia or hypercapnia: Secondary | ICD-10-CM

## 2018-06-30 DIAGNOSIS — Z880 Allergy status to penicillin: Secondary | ICD-10-CM | POA: Diagnosis not present

## 2018-06-30 DIAGNOSIS — E440 Moderate protein-calorie malnutrition: Secondary | ICD-10-CM | POA: Diagnosis present

## 2018-06-30 DIAGNOSIS — I314 Cardiac tamponade: Secondary | ICD-10-CM

## 2018-06-30 DIAGNOSIS — I309 Acute pericarditis, unspecified: Secondary | ICD-10-CM | POA: Diagnosis not present

## 2018-06-30 HISTORY — PX: TEE WITHOUT CARDIOVERSION: SHX5443

## 2018-06-30 HISTORY — PX: SUBXYPHOID PERICARDIAL WINDOW: SHX5075

## 2018-06-30 LAB — POCT I-STAT 7, (LYTES, BLD GAS, ICA,H+H)
Acid-base deficit: 1 mmol/L (ref 0.0–2.0)
BICARBONATE: 22.8 mmol/L (ref 20.0–28.0)
Calcium, Ion: 1.18 mmol/L (ref 1.15–1.40)
HCT: 26 % — ABNORMAL LOW (ref 39.0–52.0)
Hemoglobin: 8.8 g/dL — ABNORMAL LOW (ref 13.0–17.0)
O2 Saturation: 97 %
PO2 ART: 87 mmHg (ref 83.0–108.0)
Patient temperature: 98.6
Potassium: 5.8 mmol/L — ABNORMAL HIGH (ref 3.5–5.1)
Sodium: 135 mmol/L (ref 135–145)
TCO2: 24 mmol/L (ref 22–32)
pCO2 arterial: 33.8 mmHg (ref 32.0–48.0)
pH, Arterial: 7.437 (ref 7.350–7.450)

## 2018-06-30 LAB — COMPREHENSIVE METABOLIC PANEL
ALBUMIN: 3 g/dL — AB (ref 3.5–5.0)
ALT: 11 U/L (ref 0–44)
ALT: 11 U/L (ref 0–44)
ANION GAP: 9 (ref 5–15)
AST: 19 U/L (ref 15–41)
AST: 17 U/L (ref 15–41)
Albumin: 2.8 g/dL — ABNORMAL LOW (ref 3.5–5.0)
Alkaline Phosphatase: 66 U/L (ref 38–126)
Alkaline Phosphatase: 62 U/L (ref 38–126)
Anion gap: 11 (ref 5–15)
BUN: 20 mg/dL (ref 6–20)
BUN: 21 mg/dL — ABNORMAL HIGH (ref 6–20)
CO2: 25 mmol/L (ref 22–32)
CO2: 21 mmol/L — ABNORMAL LOW (ref 22–32)
Calcium: 8.7 mg/dL — ABNORMAL LOW (ref 8.9–10.3)
Calcium: 8.6 mg/dL — ABNORMAL LOW (ref 8.9–10.3)
Chloride: 103 mmol/L (ref 98–111)
Chloride: 104 mmol/L (ref 98–111)
Creatinine, Ser: 1.47 mg/dL — ABNORMAL HIGH (ref 0.61–1.24)
Creatinine, Ser: 1.31 mg/dL — ABNORMAL HIGH (ref 0.61–1.24)
GFR calc Af Amer: >60 mL/min (ref >60)
GFR calc Af Amer: >60 mL/min (ref >60)
GFR calc non Af Amer: 54 mL/min — ABNORMAL LOW (ref >60)
GFR calc non Af Amer: >60 mL/min (ref >60)
GLUCOSE: 132 mg/dL — AB (ref 70–99)
Glucose, Bld: 110 mg/dL — ABNORMAL HIGH (ref 70–99)
POTASSIUM: 5.7 mmol/L — AB (ref 3.5–5.1)
Potassium: 4.8 mmol/L (ref 3.5–5.1)
Sodium: 137 mmol/L (ref 135–145)
Sodium: 136 mmol/L (ref 135–145)
Total Bilirubin: 0.6 mg/dL (ref 0.3–1.2)
Total Bilirubin: 0.6 mg/dL (ref 0.3–1.2)
Total Protein: 6.1 g/dL — ABNORMAL LOW (ref 6.5–8.1)
Total Protein: 6.1 g/dL — ABNORMAL LOW (ref 6.5–8.1)

## 2018-06-30 LAB — CBC WITH DIFFERENTIAL/PLATELET
Abs Immature Granulocytes: 0.06 10*3/uL (ref 0.00–0.07)
Basophils Absolute: 0.1 10*3/uL (ref 0.0–0.1)
Basophils Relative: 0 %
Eosinophils Absolute: 0 10*3/uL (ref 0.0–0.5)
Eosinophils Relative: 0 %
HCT: 29.9 % — ABNORMAL LOW (ref 39.0–52.0)
HEMOGLOBIN: 9 g/dL — AB (ref 13.0–17.0)
Immature Granulocytes: 0 %
Lymphocytes Relative: 6 %
Lymphs Abs: 0.9 10*3/uL (ref 0.7–4.0)
MCH: 30.7 pg (ref 26.0–34.0)
MCHC: 30.1 g/dL (ref 30.0–36.0)
MCV: 102 fL — ABNORMAL HIGH (ref 80.0–100.0)
Monocytes Absolute: 1.7 10*3/uL — ABNORMAL HIGH (ref 0.1–1.0)
Monocytes Relative: 12 %
NRBC: 0 % (ref 0.0–0.2)
Neutro Abs: 11.4 10*3/uL — ABNORMAL HIGH (ref 1.7–7.7)
Neutrophils Relative %: 82 %
Platelets: 155 10*3/uL (ref 150–400)
RBC: 2.93 MIL/uL — AB (ref 4.22–5.81)
RDW: 16.8 % — ABNORMAL HIGH (ref 11.5–15.5)
WBC: 14.1 10*3/uL — ABNORMAL HIGH (ref 4.0–10.5)

## 2018-06-30 LAB — URINALYSIS, ROUTINE W REFLEX MICROSCOPIC
Bilirubin Urine: NEGATIVE
Glucose, UA: NEGATIVE mg/dL
Ketones, ur: NEGATIVE mg/dL
Leukocytes, UA: NEGATIVE
Nitrite: NEGATIVE
Protein, ur: NEGATIVE mg/dL
Specific Gravity, Urine: 1.026 (ref 1.005–1.030)
pH: 5 (ref 5.0–8.0)

## 2018-06-30 LAB — ECHOCARDIOGRAM LIMITED
Height: 69 in
Weight: 2095.25 oz

## 2018-06-30 LAB — BRAIN NATRIURETIC PEPTIDE: B Natriuretic Peptide: 68.1 pg/mL (ref 0.0–100.0)

## 2018-06-30 LAB — CBC
HCT: 27 % — ABNORMAL LOW (ref 39.0–52.0)
Hemoglobin: 8.5 g/dL — ABNORMAL LOW (ref 13.0–17.0)
MCH: 31.1 pg (ref 26.0–34.0)
MCHC: 31.5 g/dL (ref 30.0–36.0)
MCV: 98.9 fL (ref 80.0–100.0)
Platelets: 160 10*3/uL (ref 150–400)
RBC: 2.73 MIL/uL — ABNORMAL LOW (ref 4.22–5.81)
RDW: 16.7 % — ABNORMAL HIGH (ref 11.5–15.5)
WBC: 11.7 10*3/uL — ABNORMAL HIGH (ref 4.0–10.5)
nRBC: 0 % (ref 0.0–0.2)

## 2018-06-30 LAB — ABO/RH: ABO/RH(D): A NEG

## 2018-06-30 LAB — BODY FLUID CELL COUNT WITH DIFFERENTIAL
Eos, Fluid: 0 %
Lymphs, Fluid: 1 %
Monocyte-Macrophage-Serous Fluid: 3 % — ABNORMAL LOW (ref 50–90)
Neutrophil Count, Fluid: 96 % — ABNORMAL HIGH (ref 0–25)
Other Cells, Fluid: 0 %
Total Nucleated Cell Count, Fluid: 15000 cu mm — ABNORMAL HIGH (ref 0–1000)

## 2018-06-30 LAB — PROCALCITONIN: Procalcitonin: 0.86 ng/mL

## 2018-06-30 LAB — LACTIC ACID, PLASMA
LACTIC ACID, VENOUS: 1.1 mmol/L (ref 0.5–1.9)
Lactic Acid, Venous: 1.3 mmol/L (ref 0.5–1.9)

## 2018-06-30 LAB — APTT
aPTT: 38 seconds — ABNORMAL HIGH (ref 24–36)
aPTT: 37 seconds — ABNORMAL HIGH (ref 24–36)

## 2018-06-30 LAB — GLUCOSE, CAPILLARY: Glucose-Capillary: 118 mg/dL — ABNORMAL HIGH (ref 70–99)

## 2018-06-30 LAB — GRAM STAIN

## 2018-06-30 LAB — TYPE AND SCREEN
ABO/RH(D): A NEG
Antibody Screen: NEGATIVE

## 2018-06-30 LAB — TROPONIN I: Troponin I: <0.03 ng/mL (ref <0.03)

## 2018-06-30 LAB — MAGNESIUM: Magnesium: 1.6 mg/dL — ABNORMAL LOW (ref 1.7–2.4)

## 2018-06-30 LAB — PROTIME-INR
INR: 1.19
INR: 1.15
Prothrombin Time: 15 seconds (ref 11.4–15.2)
Prothrombin Time: 14.6 seconds (ref 11.4–15.2)

## 2018-06-30 LAB — ECHOCARDIOGRAM COMPLETE

## 2018-06-30 LAB — PHOSPHORUS: Phosphorus: 4.4 mg/dL (ref 2.5–4.6)

## 2018-06-30 LAB — MRSA PCR SCREENING: MRSA by PCR: NEGATIVE

## 2018-06-30 SURGERY — CREATION, PERICARDIAL WINDOW, SUBXIPHOID APPROACH
Anesthesia: General | Site: Chest

## 2018-06-30 MED ORDER — SUGAMMADEX SODIUM 200 MG/2ML IV SOLN
INTRAVENOUS | Status: DC | PRN
  Administered 2018-06-30: 500 mg via INTRAVENOUS

## 2018-06-30 MED ORDER — FENTANYL CITRATE (PF) 250 MCG/5ML IJ SOLN
INTRAMUSCULAR | Status: AC
  Filled 2018-06-30: qty 5

## 2018-06-30 MED ORDER — LIDOCAINE 20MG/ML (2%) 15 ML SYRINGE OPTIME
INTRAMUSCULAR | Status: DC | PRN
  Administered 2018-06-30: 100 mg via INTRAVENOUS

## 2018-06-30 MED ORDER — POTASSIUM CHLORIDE 10 MEQ/50ML IV SOLN
10.0000 meq | Freq: Every day | INTRAVENOUS | Status: DC | PRN
  Filled 2018-06-30: qty 50

## 2018-06-30 MED ORDER — ACETAMINOPHEN 500 MG PO TABS
1000.0000 mg | ORAL_TABLET | Freq: Four times a day (QID) | ORAL | Status: DC
  Administered 2018-06-30 – 2018-07-03 (×10): 1000 mg via ORAL
  Filled 2018-06-30 (×10): qty 2

## 2018-06-30 MED ORDER — DEXAMETHASONE SODIUM PHOSPHATE 10 MG/ML IJ SOLN
INTRAMUSCULAR | Status: AC
  Filled 2018-06-30: qty 1

## 2018-06-30 MED ORDER — 0.9 % SODIUM CHLORIDE (POUR BTL) OPTIME
TOPICAL | Status: DC | PRN
  Administered 2018-06-30: 2000 mL

## 2018-06-30 MED ORDER — CEFAZOLIN SODIUM-DEXTROSE 2-4 GM/100ML-% IV SOLN: 2.0000 g | INTRAVENOUS | Status: DC

## 2018-06-30 MED ORDER — LIDOCAINE 2% (20 MG/ML) 5 ML SYRINGE
INTRAMUSCULAR | Status: AC
  Filled 2018-06-30: qty 5

## 2018-06-30 MED ORDER — SODIUM CHLORIDE 0.9 % IV SOLN
1.0000 g | Freq: Two times a day (BID) | INTRAVENOUS | Status: DC
  Administered 2018-06-30: 1 g via INTRAVENOUS
  Filled 2018-06-30: qty 1

## 2018-06-30 MED ORDER — PANTOPRAZOLE SODIUM 20 MG PO TBEC
20.0000 mg | DELAYED_RELEASE_TABLET | Freq: Every day | ORAL | Status: DC
  Administered 2018-07-01 – 2018-07-03 (×3): 20 mg via ORAL
  Filled 2018-06-30 (×5): qty 1

## 2018-06-30 MED ORDER — MIDAZOLAM HCL 5 MG/5ML IJ SOLN
INTRAMUSCULAR | Status: DC | PRN
  Administered 2018-06-30 (×2): 1 mg via INTRAVENOUS

## 2018-06-30 MED ORDER — VANCOMYCIN HCL IN DEXTROSE 1-5 GM/200ML-% IV SOLN: 1000.0000 mg | Freq: Two times a day (BID) | INTRAVENOUS | Status: DC

## 2018-06-30 MED ORDER — ROCURONIUM BROMIDE 10 MG/ML (PF) SYRINGE
PREFILLED_SYRINGE | INTRAVENOUS | Status: DC | PRN
  Administered 2018-06-30 (×2): 50 mg via INTRAVENOUS

## 2018-06-30 MED ORDER — BUDESONIDE 0.5 MG/2ML IN SUSP
0.5000 mg | Freq: Two times a day (BID) | RESPIRATORY_TRACT | Status: DC
  Administered 2018-06-30 – 2018-07-03 (×7): 0.5 mg via RESPIRATORY_TRACT
  Filled 2018-06-30 (×7): qty 2

## 2018-06-30 MED ORDER — IPRATROPIUM-ALBUTEROL 0.5-2.5 (3) MG/3ML IN SOLN: 3.0000 mL | RESPIRATORY_TRACT | Status: DC | PRN

## 2018-06-30 MED ORDER — FENTANYL CITRATE (PF) 100 MCG/2ML IJ SOLN
INTRAMUSCULAR | Status: DC | PRN
  Administered 2018-06-30 (×3): 50 ug via INTRAVENOUS
  Administered 2018-06-30: 100 ug via INTRAVENOUS

## 2018-06-30 MED ORDER — DEXAMETHASONE SODIUM PHOSPHATE 10 MG/ML IJ SOLN
INTRAMUSCULAR | Status: DC | PRN
  Administered 2018-06-30: 10 mg via INTRAVENOUS

## 2018-06-30 MED ORDER — ONDANSETRON HCL 4 MG/2ML IJ SOLN
4.0000 mg | Freq: Four times a day (QID) | INTRAMUSCULAR | Status: DC | PRN
  Administered 2018-06-30: 4 mg via INTRAVENOUS

## 2018-06-30 MED ORDER — MIDAZOLAM HCL 2 MG/2ML IJ SOLN
INTRAMUSCULAR | Status: AC
  Filled 2018-06-30: qty 2

## 2018-06-30 MED ORDER — SODIUM CHLORIDE 0.9 % IV SOLN
250.0000 mL | INTRAVENOUS | Status: DC
  Administered 2018-06-30: 14:00:00 via INTRAVENOUS
  Administered 2018-06-30: 250 mL via INTRAVENOUS
  Administered 2018-06-30 (×2): via INTRAVENOUS

## 2018-06-30 MED ORDER — IPRATROPIUM BROMIDE 0.02 % IN SOLN
0.5000 mg | RESPIRATORY_TRACT | Status: DC
  Administered 2018-06-30: 0.5 mg via RESPIRATORY_TRACT
  Filled 2018-06-30: qty 2.5

## 2018-06-30 MED ORDER — ACETAMINOPHEN 325 MG PO TABS: 650.0000 mg | ORAL_TABLET | ORAL | Status: DC | PRN

## 2018-06-30 MED ORDER — SENNOSIDES-DOCUSATE SODIUM 8.6-50 MG PO TABS
1.0000 | ORAL_TABLET | Freq: Every day | ORAL | Status: DC
  Administered 2018-06-30: 1 via ORAL
  Filled 2018-06-30: qty 1

## 2018-06-30 MED ORDER — SODIUM ZIRCONIUM CYCLOSILICATE 10 G PO PACK
10.0000 g | PACK | Freq: Once | ORAL | Status: AC
  Administered 2018-06-30: 10 g via ORAL
  Filled 2018-06-30: qty 1

## 2018-06-30 MED ORDER — ARFORMOTEROL TARTRATE 15 MCG/2ML IN NEBU
15.0000 ug | INHALATION_SOLUTION | Freq: Two times a day (BID) | RESPIRATORY_TRACT | Status: DC
  Administered 2018-06-30 – 2018-07-03 (×6): 15 ug via RESPIRATORY_TRACT
  Filled 2018-06-30 (×7): qty 2

## 2018-06-30 MED ORDER — ROCURONIUM BROMIDE 50 MG/5ML IV SOSY
PREFILLED_SYRINGE | INTRAVENOUS | Status: AC
  Filled 2018-06-30: qty 10

## 2018-06-30 MED ORDER — SUGAMMADEX SODIUM 500 MG/5ML IV SOLN
INTRAVENOUS | Status: AC
  Filled 2018-06-30: qty 5

## 2018-06-30 MED ORDER — IPRATROPIUM-ALBUTEROL 0.5-2.5 (3) MG/3ML IN SOLN: 3.0000 mL | RESPIRATORY_TRACT | Status: DC

## 2018-06-30 MED ORDER — VANCOMYCIN HCL IN DEXTROSE 1-5 GM/200ML-% IV SOLN
1000.0000 mg | INTRAVENOUS | Status: DC
  Administered 2018-06-30 – 2018-07-01 (×2): 1000 mg via INTRAVENOUS
  Filled 2018-06-30 (×2): qty 200

## 2018-06-30 MED ORDER — PROPOFOL 10 MG/ML IV BOLUS
INTRAVENOUS | Status: AC
  Filled 2018-06-30: qty 20

## 2018-06-30 MED ORDER — LACTATED RINGERS IV SOLN
INTRAVENOUS | Status: DC
  Administered 2018-06-30: 04:00:00 via INTRAVENOUS

## 2018-06-30 MED ORDER — ONDANSETRON HCL 4 MG/2ML IJ SOLN: 4.0000 mg | Freq: Four times a day (QID) | INTRAMUSCULAR | Status: DC | PRN

## 2018-06-30 MED ORDER — SODIUM CHLORIDE 0.9 % IV SOLN
2.0000 g | Freq: Two times a day (BID) | INTRAVENOUS | Status: DC
  Administered 2018-06-30 – 2018-07-02 (×4): 2 g via INTRAVENOUS
  Filled 2018-06-30 (×5): qty 2

## 2018-06-30 MED ORDER — PHENYLEPHRINE 40 MCG/ML (10ML) SYRINGE FOR IV PUSH (FOR BLOOD PRESSURE SUPPORT)
PREFILLED_SYRINGE | INTRAVENOUS | Status: AC
  Filled 2018-06-30: qty 20

## 2018-06-30 MED ORDER — ACETAMINOPHEN 160 MG/5ML PO SOLN
1000.0000 mg | Freq: Four times a day (QID) | ORAL | Status: DC
  Filled 2018-06-30 (×4): qty 40.6

## 2018-06-30 MED ORDER — ONDANSETRON HCL 4 MG/2ML IJ SOLN
INTRAMUSCULAR | Status: AC
  Filled 2018-06-30: qty 2

## 2018-06-30 MED ORDER — OXYCODONE HCL 5 MG PO TABS
5.0000 mg | ORAL_TABLET | ORAL | Status: DC | PRN
  Administered 2018-07-02: 5 mg via ORAL
  Filled 2018-06-30: qty 1

## 2018-06-30 MED ORDER — SODIUM CHLORIDE 0.9 % IV SOLN
INTRAVENOUS | Status: DC | PRN
  Administered 2018-06-30: 50 ug/min via INTRAVENOUS

## 2018-06-30 MED ORDER — PROPOFOL 10 MG/ML IV BOLUS
INTRAVENOUS | Status: DC | PRN
  Administered 2018-06-30: 160 mg via INTRAVENOUS

## 2018-06-30 MED ORDER — PHENYLEPHRINE HCL 10 MG/ML IJ SOLN
INTRAMUSCULAR | Status: AC
  Filled 2018-06-30: qty 1

## 2018-06-30 MED ORDER — FENTANYL CITRATE (PF) 100 MCG/2ML IJ SOLN
25.0000 ug | INTRAMUSCULAR | Status: DC | PRN
  Filled 2018-06-30: qty 2

## 2018-06-30 MED ORDER — BISACODYL 5 MG PO TBEC
10.0000 mg | DELAYED_RELEASE_TABLET | Freq: Every day | ORAL | Status: DC
  Administered 2018-06-30 – 2018-07-01 (×2): 10 mg via ORAL
  Filled 2018-06-30 (×3): qty 2

## 2018-06-30 MED ORDER — SODIUM ZIRCONIUM CYCLOSILICATE 10 G PO PACK
10.0000 g | PACK | Freq: Three times a day (TID) | ORAL | Status: DC
  Filled 2018-06-30 (×3): qty 1

## 2018-06-30 MED ORDER — IPRATROPIUM BROMIDE 0.02 % IN SOLN
0.5000 mg | Freq: Four times a day (QID) | RESPIRATORY_TRACT | Status: DC
  Administered 2018-06-30 (×2): 0.5 mg via RESPIRATORY_TRACT
  Filled 2018-06-30 (×2): qty 2.5

## 2018-06-30 MED ORDER — LEVALBUTEROL HCL 0.63 MG/3ML IN NEBU
0.6300 mg | INHALATION_SOLUTION | Freq: Four times a day (QID) | RESPIRATORY_TRACT | Status: DC
  Administered 2018-06-30 (×2): 0.63 mg via RESPIRATORY_TRACT
  Filled 2018-06-30 (×2): qty 3

## 2018-06-30 MED ORDER — MAGNESIUM SULFATE 2 GM/50ML IV SOLN
2.0000 g | Freq: Once | INTRAVENOUS | Status: AC
  Administered 2018-06-30: 2 g via INTRAVENOUS
  Filled 2018-06-30: qty 50

## 2018-06-30 MED ORDER — PHENYLEPHRINE HCL-NACL 10-0.9 MG/250ML-% IV SOLN: 25.0000 ug/min | INTRAVENOUS | Status: DC

## 2018-06-30 SURGICAL SUPPLY — 47 items
CANISTER SUCT 3000ML PPV (MISCELLANEOUS) ×3 IMPLANT
CATH THORACIC 28FR (CATHETERS) IMPLANT
CATH THORACIC 28FR RT ANG (CATHETERS) IMPLANT
CLIP VESOCCLUDE MED 24/CT (CLIP) ×3 IMPLANT
CLIP VESOCCLUDE SM WIDE 24/CT (CLIP) ×3 IMPLANT
CONN ST 1/4X3/8  BEN (MISCELLANEOUS) ×1
CONN ST 1/4X3/8 BEN (MISCELLANEOUS) ×2 IMPLANT
CONT SPEC 4OZ CLIKSEAL STRL BL (MISCELLANEOUS) ×3 IMPLANT
COVER WAND RF STERILE (DRAPES) IMPLANT
DERMABOND ADHESIVE PROPEN (GAUZE/BANDAGES/DRESSINGS) ×1
DERMABOND ADVANCED (GAUZE/BANDAGES/DRESSINGS) ×2
DERMABOND ADVANCED .7 DNX12 (GAUZE/BANDAGES/DRESSINGS) ×4 IMPLANT
DERMABOND ADVANCED .7 DNX6 (GAUZE/BANDAGES/DRESSINGS) ×2 IMPLANT
DRAIN CHANNEL 28F RND 3/8 FF (WOUND CARE) ×3 IMPLANT
DRAPE LAPAROSCOPIC ABDOMINAL (DRAPES) ×3 IMPLANT
DRSG AQUACEL AG ADV 3.5X 4 (GAUZE/BANDAGES/DRESSINGS) ×3 IMPLANT
ELECT REM PT RETURN 9FT ADLT (ELECTROSURGICAL) ×3
ELECTRODE REM PT RTRN 9FT ADLT (ELECTROSURGICAL) ×2 IMPLANT
GAUZE SPONGE 4X4 12PLY STRL (GAUZE/BANDAGES/DRESSINGS) IMPLANT
GAUZE SPONGE 4X4 12PLY STRL LF (GAUZE/BANDAGES/DRESSINGS) ×3 IMPLANT
GLOVE BIO SURGEON STRL SZ 6.5 (GLOVE) ×6 IMPLANT
HEMOSTAT POWDER SURGIFOAM 1G (HEMOSTASIS) IMPLANT
KIT BASIN OR (CUSTOM PROCEDURE TRAY) ×3 IMPLANT
KIT TURNOVER KIT B (KITS) ×3 IMPLANT
NS IRRIG 1000ML POUR BTL (IV SOLUTION) ×6 IMPLANT
PACK CHEST (CUSTOM PROCEDURE TRAY) ×3 IMPLANT
PAD ARMBOARD 7.5X6 YLW CONV (MISCELLANEOUS) ×6 IMPLANT
PAD ELECT DEFIB RADIOL ZOLL (MISCELLANEOUS) ×3 IMPLANT
SUT SILK  1 MH (SUTURE) ×1
SUT SILK 1 MH (SUTURE) ×2 IMPLANT
SUT SILK 1 TIES 10X30 (SUTURE) ×3 IMPLANT
SUT VIC AB 1 CTX 18 (SUTURE) ×3 IMPLANT
SUT VIC AB 2-0 CT1 27 (SUTURE) ×1
SUT VIC AB 2-0 CT1 TAPERPNT 27 (SUTURE) ×2 IMPLANT
SUT VIC AB 2-0 CTX 27 (SUTURE) ×3 IMPLANT
SUT VIC AB 3-0 X1 27 (SUTURE) ×3 IMPLANT
SWAB COLLECTION DEVICE MRSA (MISCELLANEOUS) IMPLANT
SWAB CULTURE ESWAB REG 1ML (MISCELLANEOUS) IMPLANT
SYR 10ML LL (SYRINGE) IMPLANT
SYR 50ML SLIP (SYRINGE) IMPLANT
SYSTEM SAHARA CHEST DRAIN ATS (WOUND CARE) ×3 IMPLANT
TAPE CLOTH SURG 4X10 WHT LF (GAUZE/BANDAGES/DRESSINGS) ×3 IMPLANT
TOWEL GREEN STERILE (TOWEL DISPOSABLE) ×3 IMPLANT
TOWEL GREEN STERILE FF (TOWEL DISPOSABLE) ×3 IMPLANT
TRAP SPECIMEN MUCOUS 40CC (MISCELLANEOUS) ×9 IMPLANT
TRAY FOLEY SLVR 16FR TEMP STAT (SET/KITS/TRAYS/PACK) ×3 IMPLANT
WATER STERILE IRR 1000ML POUR (IV SOLUTION) ×6 IMPLANT

## 2018-06-30 NOTE — H&P (Signed)
NAME:  Alexander [name], MRN:  [id number], DOB:  Jan 24, 1966, LOS: 0 ADMISSION DATE:  06/30/2018, CONSULTATION DATE:  06/30/2018 REFERRING MD:  Dr. Joni Fears, CHIEF COMPLAINT:  SOB/ chest pain  Brief History   8 yoM with adenocarcinoma lung CA s/p radiation and chemo presenting with acute onset of SOB and pleuritic chest pain, found to be tachycardic and hypotensive with CTA PE neg for PE but showed moderate pericardial effusion, with overall hemodynamic picture concerning for tamponade physiology.  Transferred from [facility] to [facility] for TCTS eval and further care. PCCM to admit.    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   Patient is followed by Dr. Janese Banks with oncology at [facility].  He is status post concurrent chemoradiation, last radiation 05/24/2018, and status post 4 cycles of cisplatin and alimta, with first treatment of maintenance durvalumab on 06/17/2018.    Patient presenting to [facility] ED with complaints of new onset pleuritic chest pain and shortness of breath after waking up 2/4.  Reports chest pain was intermittent, substernal exacerbated with cough/ deep breathing; no radiation.  States he was feeling good prior to this.  No fever, chills, or change in his cough or sputum production- which is clear, nausea, or vomiting.  He reports drinking liquids good and drinking ensure, denies change in appetite.   He reports that he lives alone and next of kin is his sister-in-law, Hilda Blades, and close friend Herbie Baltimore.  He has been unable to work since his diagnosis in October, but prior to was a Dealer.   In the ER, found to be afebrile, tachycardic and hypotensive with SBP 80-90's, with normal oxygen saturations.  Initial concern for sepsis vs PE.  He was started on empiric vancomycin and cefepime and given 2L NS.  Labs significant for WBC 17.5, Hgb 10.2, sCr 1.28  (baseline around 1), BUN 18, neg trop x 2, lactic acid 1.7, PCT 0.21.  CTA PE was negative for PE but showed a moderate pericardial effusion and stable right upper lobe 1.6 spiculated nodule.  He was transferred to [facility] for further care and evaluation by TCTS.  PCCM to admit to ICU for close monitoring.   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 No evidence of pulmonary embolus. 2. New moderate pericardial effusion. 3. Stable right upper lobe 1.6 cm spiculated nodule. 4. Interval improvement in tree-in-bud opacities compatible with bronchitis/ bronchiolitis. No new consolidation. 5. Aortic Atherosclerosis and emphysema   CXR 2/5 >> negative  Micro Data:  2/4 BCx 2 >> 2/4 MRSA PCR >> neg 2/4 UC >>  Antimicrobials:  2/4 Vancomycin >> 2/4 cefepime >>  Interim history/subjective:   Objective   Blood pressure 103/69, pulse (!) 111, temperature 98.2 F (36.8 C), temperature source Oral, resp. rate (!) 23, height 5\' 9"  (1.753 m), weight 59.4 kg, SpO2 100 %.       No intake or output data in the 24 hours ending 06/30/18 0306 Filed Weights   06/30/18 0200  Weight: 59.4 kg    Examination: General:  Adult male sitting upright in bed initially in NAD HEENT:  MM pink/moist, ++ JVD Neuro: Alert, oriented, non focal  CV: ST 106, rrr, distant, difficult to hear given diffuse wheezing PULM: even/non-labored, mild tachypnea, diffuse wheezes throughout, slight bibasilar rales, congested cough JK:DTOI, non-tender, bs active  Extremities: warm/dry, edema noted to BUE, no LE  Edema, port in right thigh accessed Skin: no rashes  Resolved Hospital Problem list    Assessment & Plan:   Pericardial  Effusion  - with initial tachycardia and hypotension, moderate pericardial effusion noted on CTA PE, concerning for tamponade, with improvement after IVFs - EKG with narrow QRS complexes compared to prior EKGs - ddx concerning for radiation induced vs immunotherapy induced vs infectious vs disease progression (although stable size of RUL nodule) P:  S/p TTE at [facility], pending read Cardiology consulted, currently at bedside- no emergent intervention needed at this time Consult TCTS in am for probable window Hold eliquis- last dose 2/4 am  Currently normotensive with improvement in tachycardia, normal lactic x 2,  if hypotension returns, consider additional fluid bolus  NPO for now  Leukocytosis - afebrile, neg PCT - immunocompromised  P:  Continue vancomycin/ cefepime for now, even though PCT is reassuring given patient is immunocompromised Follow culture data  Send expectorated sputum if patient able to produce  Trend WBC/ fever curve   AKI s/p IV contrast - baseline sCr ~1, uSG high - 2L IVF given  P:  LR at 50 ml/hr Trend BMP /mag/ phos/  urinary output Avoid nephrotoxic agents, renal dose meds  SVC syndrome P:  Holding Eliquis for now  Stage IIIb adenocarcinoma of the right lung - stable size in spiculated RUL nodule on CTA PE  - scheduled for next durvalumab dose on 2/6 P:  Will need oncology consulted in am for formal consult  Anemia - Hgb trend stable P:  Trend on CBC T&S sent  COPD- no prior formal workup/ PFTs, emphysemous changes on CT Current smoker  -  Prn albuterol at home  P: Xopenex q6hr / atrovent q 4 Brovanna/ pulmicort  Wean O2 for goal sat 88-94%- currently on 2L at >98%  Best practice:  Diet: NPO Pain/Anxiety/Delirium protocol (if indicated): n/a VAP protocol (if indicated): n/a DVT prophylaxis: SCDs for now GI prophylaxis: n/a, resume home protonix  Glucose control: trend Mobility: BR Code Status: Discussed with patient, states he  has not had GOC discussion prior to.  He states "if I go, let me go, its my time".  He is ok with current treatment but in the event of cardiac or respiratory arrest, he does not want CPR or intubation.  His NOK he reports is Hilda Blades, a sister-in-law.  I encouraged him to share his wishes with her.    Family Communication: No family/ friends present.  Patient updated on plan of care. Disposition: ICU  Labs   CBC: Recent Labs  Lab 06/29/18 1619  WBC 17.5*  NEUTROABS 15.6*  HGB 10.2*  HCT 32.3*  MCV 98.8  PLT 712    Basic Metabolic Panel: Recent Labs  Lab 06/29/18 1619  NA 137  K 5.1  CL 103  CO2 26  GLUCOSE 156*  BUN 18  CREATININE 1.28*  CALCIUM 8.8*   GFR: Estimated Creatinine Clearance: 56.1 mL/min (A) (by C-G formula based on SCr of 1.28 mg/dL (H)). Recent Labs  Lab 06/29/18 1619  PROCALCITON 0.21  WBC 17.5*  LATICACIDVEN 1.7    Liver Function Tests: Recent Labs  Lab 06/29/18 1619  AST 23  ALT 9  ALKPHOS 71  BILITOT 0.7  PROT 6.9  ALBUMIN 3.5   Recent Labs  Lab 06/29/18 1619  LIPASE 23   No results for input(s): AMMONIA in the last 168 hours.  ABG No results found for: PHART, PCO2ART, PO2ART, HCO3, TCO2, ACIDBASEDEF, O2SAT   Coagulation Profile: Recent Labs  Lab 06/29/18 1619  INR 1.15    Cardiac Enzymes: Recent Labs  Lab 06/29/18 1619 06/29/18 1906  TROPONINI <0.03 <0.03    HbA1C: No results found for: HGBA1C  CBG: Recent Labs  Lab 06/30/18 0201  GLUCAP 118*    Review of Systems:   POSITIVES IN BOLD Gen: Denies fever, chills, weight change HEENT: Denies vision, sinus congestion, rhinorrhea, sore throat,  PULM: Denies shortness of breath, cough, sputum production, hemoptysis, wheezing, pleuritic type chest pain CV: Denies chest pain, edema, orthopnea, paroxysmal nocturnal dyspnea, palpitations GI: Denies abdominal pain, nausea, vomiting, diarrhea, change in bowel habits GU: Denies dysuria, hematuria, polyuria,  oliguria Endocrine: Denies hot or cold intolerance, polyuria, polyphagia or appetite change Derm: Denies rash, dry skin Heme: Denies easy bruising, bleeding Neuro: Denies headache, numbness, weakness, slurred speech, loss of memory or consciousness  Past Medical History  He,  has a past medical history of Cancer ([place]), COPD (chronic obstructive pulmonary disease) ([place]), and Lung mass.   Surgical History    Past Surgical History:  Procedure Laterality Date  . ENDOBRONCHIAL ULTRASOUND N/A 03/11/2018   Procedure: ENDOBRONCHIAL ULTRASOUND;  Surgeon: Tyler Pita, MD;  Location: [facility] ORS;  Service: Cardiopulmonary;  Laterality: N/A;  . none    . PORTA CATH INSERTION N/A 03/12/2018   Procedure: PORTA CATH INSERTION, thigh;  Surgeon: Katha Cabal, MD;  Location: [place] [facility];  Service: Cardiovascular;  Laterality: N/A;     Social History   reports that he has been smoking. He has been smoking about 0.25 packs per day. He has never used smokeless tobacco. He reports previous alcohol use. He reports current drug use. Drug: Marijuana.   Family History   His family history is not on file.   Allergies Allergies  Allergen Reactions  . Doxycycline Swelling  . Penicillins      Home Medications  Prior to Admission medications   Medication Sig Start Date End Date Taking? Authorizing Provider  apixaban (ELIQUIS) 5 MG TABS tablet Take 1 tablet (5 mg total) by mouth 2 (two) times daily. 04/08/18 06/29/18  Sindy Guadeloupe, MD  oxyCODONE-acetaminophen (PERCOCET/ROXICET) 5-325 MG tablet Take 1-2 tablets by mouth every 4 (four) hours as needed for moderate pain. 05/20/18   Sindy Guadeloupe, MD  pantoprazole (PROTONIX) 20 MG tablet Take 1 tablet (20 mg total) by mouth daily. 05/20/18   Sindy Guadeloupe, MD  potassium chloride SA (K-DUR,KLOR-CON) 20 MEQ tablet Take 1 tablet (20 mEq total) by mouth daily. Patient not taking: Reported on 06/25/2018 06/04/18   Sindy Guadeloupe, MD     Critical  care time: 28 mins     Kennieth Rad, MSN, AGACNP-BC [facility] Pgr: [phone] or if no answer [phone] 06/30/2018, 4:26 AM

## 2018-06-30 NOTE — Consult Note (Signed)
Cardiology Consult    Patient ID: Alexander [name] MRN: [id number], DOB/AGE: 1965-10-04   Admit date: 06/30/2018 Date of Consult: 06/30/2018  Primary Physician: Patient, No Pcp Per Primary Cardiologist: None Requesting Provider: Frederik Pear, MD   History of Present Illness    This is a 53 year old man with COPD and stage IIIB adenocarcinoma of the right lung complicated by SVC syndrome (on Eliquis) s/p chemoradiation completed 05/24/2018, and now 1 dose darvalumab on 06/17/2018. He presented to [facility] today for sharp, severe, pleuritic chest pain and dyspnea when he woke up this morning. He has has some pleuritic chest pain with his lung cancer, but significantly worsened now with worsening of his dyspnea. No hemoptysis, no syncope, no new headache. On arrival he was found to be tachycardic up to the 140s, tachypneic, and borderline hypotensive. He was fluid responsive, with BP improving to 100/80 after 1L IVF and HR coming down from 140 to 120. No hypoxemia. He is orthopneic at baseline but was able to tolerate a CTA that was negative for PE but did show pericardial effusion, new from CT on 05/31/2018. ECG shows sinus tachycardia and new diffuse PR depression with reciprocal PR elevation in aVR. Labs significant for 2 negative troponins, normal lactate, WBC 17.5, Cr 1.28 (stable), low procalcitonin (0.21). Oncology does not believe this effusion represents disease progression given the stability of his parenchymal disease, though may be related to immunotherapy or radiation.  Inpatient Medications    Scheduled Meds: Continuous Infusions: . sodium chloride Stopped (06/30/18 0335)  . ceFEPime (MAXIPIME) IV    . lactated ringers 75 mL/hr at 06/30/18 0345  . vancomycin     PRN Meds:.acetaminophen, ipratropium-albuterol, ondansetron (ZOFRAN) IV  Past Medical History   Past Medical History:  Diagnosis Date  . Cancer ([place])   . COPD (chronic obstructive pulmonary disease) ([place])   . Lung mass       Past Surgical History:  Procedure Laterality Date  . ENDOBRONCHIAL ULTRASOUND N/A 03/11/2018   Procedure: ENDOBRONCHIAL ULTRASOUND;  Surgeon: Tyler Pita, MD;  Location: [facility] ORS;  Service: Cardiopulmonary;  Laterality: N/A;  . none    . PORTA CATH INSERTION N/A 03/12/2018   Procedure: PORTA CATH INSERTION, thigh;  Surgeon: Katha Cabal, MD;  Location: [place] [facility];  Service: Cardiovascular;  Laterality: N/A;    Allergies Allergies  Allergen Reactions  . Doxycycline Swelling  . Penicillins    Family History    No family history on file. has no family status information on file.    Social History    Social History   Socioeconomic History  . Marital status: Divorced    Spouse name: Not on file  . Number of children: Not on file  . Years of education: Not on file  . Highest education level: Not on file  Occupational History  . Occupation: Cabin crew  Social Needs  . Financial resource strain: Not on file  . Food insecurity:    Worry: Not on file    Inability: Not on file  . Transportation needs:    Medical: Not on file    Non-medical: Not on file  Tobacco Use  . Smoking status: Current Every Day Smoker    Packs/day: 0.25  . Smokeless tobacco: Never Used  Substance and Sexual Activity  . Alcohol use: Not Currently  . Drug use: Yes    Types: Marijuana  . Sexual activity: Not Currently  Lifestyle  . Physical activity:    Days  per week: Not on file    Minutes per session: Not on file  . Stress: Not on file  Relationships  . Social connections:    Talks on phone: Not on file    Gets together: Not on file    Attends religious service: Not on file    Active member of club or organization: Not on file    Attends meetings of clubs or organizations: Not on file    Relationship status: Not on file  . Intimate partner violence:    Fear of current or ex partner: Not on file    Emotionally abused: Not on file    Physically abused: Not on  file    Forced sexual activity: Not on file  Other Topics Concern  . Not on file  Social History Narrative  . Not on file     Review of Systems    General:  No chills, fever, night sweats.  Cardiovascular:  See HPI Dermatological: No rash, lesions/masses Respiratory: No cough. Urologic: No hematuria, dysuria Abdominal:   No nausea, vomiting, diarrhea, bright red blood per rectum, melena, or hematemesis Neurologic:  No visual changes, changes in mental status. All other systems reviewed and are otherwise negative except as noted above.  Physical Exam    Blood pressure 103/69, pulse (!) 111, temperature 98.2 F (36.8 C), temperature source Oral, resp. rate (!) 23, height 5\' 9"  (1.753 m), weight 59.4 kg, SpO2 100 %.   General: Chronically ill and uncomfortable appearing, but in no acute distress Neuro: Alert and conversant. No gross focal deficits HEENT: Normal Neck: Supple without bruits or JVD. Lungs:  Tachypnea, marked inspiratory ronchi and expiratory wheezes anteriorly Heart: Severely muffled, no audible heart sounds.  Abdomen: Mild epigastric tenderness, non-distended, BS + Extremities: No clubbing, cyanosis or edema. Radials 2+ and equal bilaterally.  Labs    Troponin (Point of Care Test) No results for input(s): TROPIPOC in the last 72 hours. Recent Labs    06/29/18 1619 06/29/18 1906  TROPONINI <0.03 <0.03   Lab Results  Component Value Date   WBC 17.5 (H) 06/29/2018   HGB 10.2 (L) 06/29/2018   HCT 32.3 (L) 06/29/2018   MCV 98.8 06/29/2018   PLT 204 06/29/2018    Recent Labs  Lab 06/29/18 1619  NA 137  K 5.1  CL 103  CO2 26  BUN 18  CREATININE 1.28*  CALCIUM 8.8*  PROT 6.9  BILITOT 0.7  ALKPHOS 71  ALT 9  AST 23  GLUCOSE 156*   No results found for: CHOL, HDL, LDLCALC, TRIG No results found for: [facility]   Radiology Studies    Ct Angio Chest Pe W And/or Wo Contrast Result Date: 06/29/2018 1. No evidence of pulmonary embolus.  2. Moderate  pericardial effusion.  3. Stable right upper lobe 1.6 cm spiculated nodule.  4. Interval improvement in tree-in-bud opacities compatible with bronchitis/bronchiolitis. No new consolidation.  5. Aortic Atherosclerosis and Emphysema   ECG & Cardiac Imaging    ECG: sinus tachycardia, borderline rightward axis, diffuse PR segment depression with some reciprocal PR elevation in aVR.  Limited bedside echocardiogram, personally performed and interpreted: - Very technically difficult, likely due to COPD and lung cancer - Moderate to large circumferential LOCULATED pericardial effusion - Mitral inflow velocity varies by approximately 40-50% with respiration - The RA and RV fill well with only mild diastolic invagination.  - The IVC measures 2.4cm with just less that 50% variation with inspiration, suggesting elevated RAP. - Overall findings  suggest elevated intrapericardial pressure, though maintaining adequate RV filling.  Assessment & Plan   53 year old man with stage IIIB adenocarcinoma of the lung complicated by SVC syndrome (on Eliquis) s/p recent chemoradiation and now immunotherapy with darvalumab, presenting with pleuritic chest pain, dyspnea, new loculated pericardial effusion, and ECG changes consistent with acute pericarditis. Given loculated appearance, this is suspicious for malignant effusion. Other potential etiologies would be radiation or less likely checkpoint inhibitor; pericardial effusions are not as well described with darvalumab as with other checkpoint inhibitors, though may present with tamponade requiring treatment with glucocorticoids. Recommend oncology consultation for consideration of glucocorticoid treatment if they feel this could be the etiology. He is currently hemodynamically stable after IV fluids and RV is filling well, but with signs of increase intrapericardial pressure. This would ideally be treated with surgical decortication and pericardial window and sent for  cytology.  Acute pericarditis with loculated pericardial effusion. - Complete TTE in the morning - Consult cardiac surgery for decortication and pericardial window, non-emergent though should be done expeditiously in the morning. - Consult oncology to discuss potential checkpoint inhibitor toxicity and need for glucocorticoid. Otherwise would treat with colchicine, but avoid NSAIDS given labile renal function.   Signed, Marykay Lex, NP 06/30/2018, 3:44 AM  For questions or updates, please contact   Please consult [email or web address] for contact info under Cardiology/STEMI.

## 2018-06-30 NOTE — Progress Notes (Signed)
NAME:  Alexander [name], MRN:  [id number], DOB:  12-Feb-1966, LOS: 0 ADMISSION DATE:  06/30/2018, CONSULTATION DATE:  06/30/2018 REFERRING MD:  Dr. Joni Fears, CHIEF COMPLAINT:  SOB/ chest pain  Brief History   28 yoM with adenocarcinoma lung CA s/p radiation and chemo presenting with acute onset of SOB and pleuritic chest pain, found to be tachycardic and hypotensive with CTA PE neg for PE but showed moderate pericardial effusion, with overall hemodynamic picture concerning for tamponade physiology.  Transferred from [facility] to [facility] for TCTS eval and further care. PCCM to admit.    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   Patient is followed by Dr. Janese Banks with oncology at [facility].  He is status post concurrent chemoradiation, last radiation 05/24/2018, and status post 4 cycles of cisplatin and alimta, with first treatment of maintenance durvalumab on 06/17/2018.    Patient presenting to [facility] ED with complaints of new onset pleuritic chest pain and shortness of breath after waking up 2/4.  Reports chest pain was intermittent, substernal exacerbated with cough/ deep breathing; no radiation.  States he was feeling good prior to this.  No fever, chills, or change in his cough or sputum production- which is clear, nausea, or vomiting.  He reports drinking liquids good and drinking ensure, denies change in appetite.   He reports that he lives alone and next of kin is his sister-in-law, Hilda Blades, and close friend Herbie Baltimore.  He has been unable to work since his diagnosis in October, but prior to was a Dealer.   In the ER, found to be afebrile, tachycardic and hypotensive with SBP 80-90's, with normal oxygen saturations.  Initial concern for sepsis vs PE.  He was started on empiric vancomycin and cefepime and given 2L NS.  Labs significant for WBC 17.5, Hgb 10.2, sCr 1.28  (baseline around 1), BUN 18, neg trop x 2, lactic acid 1.7, PCT 0.21.  CTA PE was negative for PE but showed a moderate pericardial effusion and stable right upper lobe 1.6 spiculated nodule.  He was transferred to [facility] for further care and evaluation by TCTS.  PCCM to admit to ICU for close monitoring.   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 No evidence of pulmonary embolus. 2. New moderate pericardial effusion. 3. Stable right upper lobe 1.6 cm spiculated nodule. 4. Interval improvement in tree-in-bud opacities compatible with bronchitis/ bronchiolitis. No new consolidation. 5. Aortic Atherosclerosis and emphysema   CXR 2/5 >> negative  Micro Data:  2/4 BCx 2 >> 2/4 MRSA PCR >> neg 2/4 UC >>  Antimicrobials:  2/4 Vancomycin >> 2/4 cefepime >>  Interim history/subjective:  Going to operating room for pericardial window Objective   Blood pressure 109/89, pulse (!) 102, temperature 98.2 F (36.8 C), temperature source Oral, resp. rate 19, height 5\' 9"  (1.753 m), weight 59.4 kg, SpO2 100 %.        Intake/Output Summary (Last 24 hours) at 06/30/2018 1242 Last data filed at 06/30/2018 1200 Gross per 24 hour  Intake 577.65 ml  Output 250  ml  Net 327.65 ml   Filed Weights   06/30/18 0200  Weight: 59.4 kg    Examination: General: Middle-aged male no acute distress on rest HEENT: No JVD is appreciated Neuro: Intact CV: Sounds regular with murmur, noted to have pulses paradoxus lower extremity PULM: Decreased breath sounds at bases mild expiratory wheeze NF:AOZH, non-tender, bsx4 active  Extremities: warm/dry, 1+ edema  Skin:  no rashes or lesions   Resolved Hospital Problem list    Assessment & Plan:   Pericardial Effusion  - with initial tachycardia and hypotension, moderate pericardial effusion noted on CTA PE, concerning for tamponade, with improvement after IVFs - EKG with narrow QRS complexes compared to prior EKGs - ddx concerning for radiation induced vs immunotherapy induced vs infectious vs disease progression (although stable size of RUL nodule) P:  Noted to have pericardial effusion and tamponade via ultrasound Surgery by thoracic surgery 06/30/2018 at 12:45 PM Most likely will go to the heart unit postoperatively for further evaluation and care Pulmonary critical care will continue to follow as needed  Leukocytosis - afebrile, neg PCT - immunocompromised  P:   Follow  culture data  continue antibiotics as he is going to surgery  AKI s/p IV contrast Lab Results  Component Value Date   CREATININE 1.47 (H) 06/30/2018   CREATININE 1.28 (H) 06/29/2018   CREATININE 1.25 (H) 06/17/2018    - baseline sCr ~1, uSG high - 2L IVF given  P:  Monitor creatinine Continue Ringer's  SVC syndrome P:  Off Eliquis Noted to have pulses paradoxus  Stage IIIb adenocarcinoma of the right lung - stable size in spiculated RUL nodule on CTA PE  - scheduled for next durvalumab dose on 2/6 P:  We will need oncology consult on postop  Anemia - Hgb trend stable P:  Follow CBC  COPD- no prior formal workup/ PFTs, emphysemous changes on CT Current smoker  -  Prn albuterol at home  P: O2 as needed Bronchodilators  Best practice:  Diet: NPO Pain/Anxiety/Delirium protocol (if indicated): n/a VAP protocol (if indicated): n/a DVT prophylaxis: SCDs for now GI prophylaxis: n/a, resume home protonix  Glucose control: trend Mobility: BR Code Status: Discussed with patient, states he has not had GOC discussion prior to.  He states "if I go, let me go, its my time".  He is ok with current  treatment but in the event of cardiac or respiratory arrest, he does not want CPR or intubation.  His NOK he reports is Hilda Blades, a sister-in-law.  I encouraged him to share his wishes with her.    Family Communication: 06/30/2017 patient and family updated prior to going to surgery Disposition: To operating room and then most likely to 2 heart  Labs   CBC: Recent Labs  Lab 06/29/18 1619 06/30/18 0326 06/30/18 0341  WBC 17.5* 14.1*  --   NEUTROABS 15.6* 11.4*  --   HGB 10.2* 9.0* 8.8*  HCT 32.3* 29.9* 26.0*  MCV 98.8 102.0*  --   PLT 204 155  --     Basic Metabolic Panel: Recent Labs  Lab 06/29/18 1619 06/30/18 0326 06/30/18 0341  NA 137 137 135  K 5.1 5.7* 5.8*  CL 103 103  --   CO2 26 25  --   GLUCOSE 156* 132*  --   BUN 18 20  --   CREATININE 1.28* 1.47*  --   CALCIUM 8.8* 8.7*  --   MG  --  1.6*  --   PHOS  --  4.4  --    GFR: Estimated Creatinine Clearance: 48.8 mL/min (A) (by C-G formula based on SCr of 1.47 mg/dL (H)). Recent Labs  Lab 06/29/18 1619 06/30/18 0326 06/30/18 0916  PROCALCITON 0.21 0.86  --   WBC 17.5* 14.1*  --   LATICACIDVEN 1.7 1.3 1.1    Liver Function Tests: Recent Labs  Lab 06/29/18 1619 06/30/18 0326  AST 23 19  ALT 9 11  ALKPHOS 71 66  BILITOT 0.7 0.6  PROT 6.9 6.1*  ALBUMIN 3.5 3.0*   Recent Labs  Lab 06/29/18 1619  LIPASE 23   No results for input(s): AMMONIA in the last 168 hours.  ABG    Component Value Date/Time   PHART 7.437 06/30/2018 0341   PCO2ART 33.8 06/30/2018 0341   PO2ART 87.0 06/30/2018 0341   HCO3 22.8 06/30/2018 0341   TCO2 24 06/30/2018 0341   ACIDBASEDEF 1.0 06/30/2018 0341   O2SAT 97.0 06/30/2018 0341     Coagulation Profile: Recent Labs  Lab 06/29/18 1619 06/30/18 0326  INR 1.15 1.19    Cardiac Enzymes: Recent Labs  Lab 06/29/18 1619 06/29/18 1906 06/30/18 0623  TROPONINI <0.03 <0.03 <0.03    HbA1C: No results found for: HGBA1C  CBG: Recent Labs  Lab 06/30/18 0201  GLUCAP  118*    Critical care time: 30 mins     Richardson Landry Juan Olthoff ACNP Maryanna Shape PCCM Pager [phone] till 1 pm If no answer page [phone] 06/30/2018, 12:42 PM

## 2018-06-30 NOTE — Anesthesia Procedure Notes (Addendum)
Central Venous Catheter Insertion Performed by: Duane Boston, MD, anesthesiologist Start/End2/09/2018 1:34 PM, 06/30/2018 1:44 PM Patient location: Pre-op. Preanesthetic checklist: patient identified, IV checked, site marked, risks and benefits discussed, surgical consent, monitors and equipment checked, pre-op evaluation, timeout performed and anesthesia consent Position: Trendelenburg Lidocaine 1% used for infiltration and patient sedated Hand hygiene performed , maximum sterile barriers used  and Seldinger technique used Catheter size: 8 Fr Total catheter length 16. Central line was placed.Double lumen Procedure performed using ultrasound guided technique. Ultrasound Notes:anatomy identified, needle tip was noted to be adjacent to the nerve/plexus identified, no ultrasound evidence of intravascular and/or intraneural injection and image(s) printed for medical record Attempts: 1 Following insertion, dressing applied, line sutured and Biopatch. Post procedure assessment: blood return through all ports, free fluid flow and no air  Patient tolerated the procedure well with no immediate complications.

## 2018-06-30 NOTE — Progress Notes (Signed)
  Echocardiogram Echocardiogram Transesophageal has been performed.  Darlina Sicilian M 06/30/2018, 2:37 PM

## 2018-06-30 NOTE — Progress Notes (Signed)
Patient requesting food and fluid, current order is for NPO.  Spoke with Ellwood Handler PA, Junie Panning advised from a CVTS standpoint it is okay to advance the patient's diet but to confirm with CCM.   Spoke with MD Vaughan Browner, MD advised ok to place patient on a regular diet.

## 2018-06-30 NOTE — Brief Op Note (Signed)
06/30/2018  2:54 PM  PATIENT:  Alexander [name]  53 y.o. male  PRE-OPERATIVE DIAGNOSIS:  PERICARDIAL EFFUSION  POST-OPERATIVE DIAGNOSIS:  pericardial effusion  PROCEDURE:  Procedure(s):  SUBXYPHOID PERICARDIAL WINDOW (N/A) TRANSESOPHAGEAL ECHOCARDIOGRAM (TEE) (N/A)  SURGEON:  Surgeon(s) and Role:    * [name], MD - Primary  PHYSICIAN ASSISTANT: Ellwood Handler PA-C  ANESTHESIA:   general  EBL: Minimal   BLOOD ADMINISTERED:none  DRAINS: Cleone Slim Perciardium   LOCAL MEDICATIONS USED:  NONE  SPECIMEN:  Source of Specimen:  Pericardial Fluid, Pericardium  DISPOSITION OF SPECIMEN:  PATHOLOGY  COUNTS:  YES  TOURNIQUET:  * No tourniquets in log *  DICTATION: .Dragon Dictation  PLAN OF CARE: already has admission order  PATIENT DISPOSITION:  ICU - extubated and stable.   Delay start of Pharmacological VTE agent (>24hrs) due to surgical blood loss or risk of bleeding: yes

## 2018-06-30 NOTE — Progress Notes (Signed)
[facility] Progress Note Patient Name: Alexander [name] DOB: 1965-10-31 MRN: [id number]   Date of Service  06/30/2018  HPI/Events of Note  Patient admitted with shortness of breath and hypotension. He has a history of advanced lung cancer with SVC syndrome. On evaluation in the ED he was found to have a sizeable pericardial effusion with concern for evolving tamponade physiology. He was transferred to [facility] principally to facilitate stat echo to evaluate possible pericardial tamponade.  eICU Interventions  Stat echo in progress by cardiology. Definitive Rx pending results of echo. Patient was on Eliquis for his SVC syndrome. Will avoid anti-coagulants and anti-platelet agents for now due to unknown nature of fluid in the pericardial space at this time.        Kerry Kass Ogan 06/30/2018, 2:46 AM

## 2018-06-30 NOTE — Anesthesia Procedure Notes (Signed)
Procedure Name: Intubation Date/Time: 06/30/2018 2:15 PM Performed by: Lowella Dell, CRNA Pre-anesthesia Checklist: Patient identified, Emergency Drugs available, Suction available and Patient being monitored Patient Re-evaluated:Patient Re-evaluated prior to induction Oxygen Delivery Method: Circle System Utilized Preoxygenation: Pre-oxygenation with 100% oxygen Induction Type: IV induction Ventilation: Mask ventilation without difficulty Laryngoscope Size: Mac and 4 Grade View: Grade II Tube type: Oral Tube size: 7.5 mm Number of attempts: 1 Airway Equipment and Method: Stylet Placement Confirmation: ETT inserted through vocal cords under direct vision,  positive ETCO2 and breath sounds checked- equal and bilateral Secured at: 23 cm Tube secured with: Tape Dental Injury: Teeth and Oropharynx as per pre-operative assessment

## 2018-06-30 NOTE — Anesthesia Postprocedure Evaluation (Signed)
Anesthesia Post Note  Patient: Alexander [name]  Procedure(s) Performed: SUBXYPHOID PERICARDIAL WINDOW (N/A Chest) TRANSESOPHAGEAL ECHOCARDIOGRAM (TEE) (N/A )     Patient location during evaluation: PACU Anesthesia Type: General Level of consciousness: sedated Pain management: pain level controlled Vital Signs Assessment: post-procedure vital signs reviewed and stable Respiratory status: spontaneous breathing and respiratory function stable Cardiovascular status: stable Postop Assessment: no apparent nausea or vomiting Anesthetic complications: no    Last Vitals:  Vitals:   06/30/18 1615 06/30/18 1630  BP:    Pulse: 85 75  Resp: 20 18  Temp:    SpO2: 95% 96%    Last Pain:  Vitals:   06/30/18 1630  TempSrc:   PainSc: Asleep                 Tashayla Therien,Jancarlo DANIEL

## 2018-06-30 NOTE — Consult Note (Signed)
[address]       [place] [address]             [phone]        Fransisco Ruelas Brawley Medical Record #[id number] Date of Birth: 1966/04/21  Referring: [place] Primary Care: Patient, No Pcp Per Primary Cardiologist:No primary care provider on file. Oncologist : Dr  Leeanne Rio  Chief Complaint:  Pericardial effusion    History of Present Illness:       Alexander [name] is a 53 year old male patient who is being admitted for a large pericardial effusion with concern for tamponade who was transferred from [facility].  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  He also has a history of SVC syndrome.  Port access is located in the right thigh.  He presented to [facility] with shortness of breath, chest pain, and chest pressure which started in the last 24 hours.  He was told previously that he had some fluid around his heart, however it was nothing that required intervention at that time. Echocardiogram was obtained which showed a moderately sized pericardial effusion.  No valvular disorders.  His vitals are currently stable with heart rate in the low 100s and a map of 95.  He is on 2 L nasal cannula with 100% oxygen saturation.  He is currently symptomatic.  Patient was transferred for emergent subxiphoid pericardial window , as a service is not provided at [facility]   current Activity/ Functional Status: Patient is independent with mobility/ambulation, transfers, ADL's, IADL's.   Zubrod Score: At the time of surgery this patient's most appropriate activity status/level should be described as: []     0    Normal activity, no symptoms []     1    Restricted in physical strenuous activity but ambulatory, able to do out light work [x]     2    Ambulatory and capable of self care, unable to do work activities, up and about                 more than  50%  Of the time                            []     3    Only limited self care, in bed greater than 50% of waking hours []     4    Completely disabled, no self care, confined to bed or chair []     5    Moribund  Past Medical History:  Diagnosis Date  . Cancer ([place])   . COPD (chronic obstructive pulmonary disease) ([place])   . Lung mass     Past Surgical History:  Procedure Laterality Date  . ENDOBRONCHIAL ULTRASOUND N/A 03/11/2018   Procedure: ENDOBRONCHIAL ULTRASOUND;  Surgeon: Tyler Pita, MD;  Location: [facility] ORS;  Service: Cardiopulmonary;  Laterality: N/A;  . none    . PORTA CATH INSERTION N/A 03/12/2018   Procedure: PORTA CATH INSERTION, thigh;  Surgeon: Katha Cabal, MD;  Location: [place] [facility];  Service: Cardiovascular;  Laterality: N/A;    Social History   Tobacco Use  Smoking Status Current Every Day Smoker  . Packs/day: 0.25  Smokeless Tobacco Never Used    Social History   Substance and Sexual Activity  Alcohol Use Not Currently  Allergies  Allergen Reactions  . Doxycycline Swelling  . Penicillins     Current Facility-Administered Medications  Medication Dose Route Frequency Provider Last Rate Last Dose  . 0.9 %  sodium chloride infusion  250 mL Intravenous Continuous Frederik Pear, MD 10 mL/hr at 06/30/18 1200    . acetaminophen (TYLENOL) tablet 650 mg  650 mg Oral Q4H PRN Frederik Pear, MD      . arformoterol (BROVANA) nebulizer solution 15 mcg  15 mcg Nebulization BID Jennelle Human B, NP      . budesonide (PULMICORT) nebulizer solution 0.5 mg  0.5 mg Nebulization BID Jennelle Human B, NP   0.5 mg at 06/30/18 0905  . ceFEPIme (MAXIPIME) 2 g in sodium chloride 0.9 % 100 mL IVPB  2 g Intravenous Q12H Mannam, Praveen, MD      . ipratropium (ATROVENT) nebulizer solution 0.5 mg  0.5 mg Nebulization Q6H Ogan, [place] U, MD   0.5 mg at 06/30/18 0904  . lactated ringers infusion   Intravenous Continuous Jennelle Human B, NP 50 mL/hr  at 06/30/18 1200    . levalbuterol (XOPENEX) nebulizer solution 0.63 mg  0.63 mg Nebulization Q6H Jennelle Human B, NP   0.63 mg at 06/30/18 0904  . ondansetron (ZOFRAN) injection 4 mg  4 mg Intravenous Q6H PRN Ogan, Kerry Kass, MD      . pantoprazole (PROTONIX) EC tablet 20 mg  20 mg Oral Daily Jennelle Human B, NP   Stopped at 06/30/18 1032  . sodium zirconium cyclosilicate (LOKELMA) packet 10 g  10 g Oral TID Frederik Pear, MD   Stopped at 06/30/18 1032  . vancomycin (VANCOCIN) IVPB 1000 mg/200 mL premix  1,000 mg Intravenous Q24H Franky Macho, RPH        Medications Prior to Admission  Medication Sig Dispense Refill Last Dose  . apixaban (ELIQUIS) 5 MG TABS tablet Take 1 tablet (5 mg total) by mouth 2 (two) times daily. 60 tablet 3 06/29/2018 at 0930  . oxyCODONE-acetaminophen (PERCOCET/ROXICET) 5-325 MG tablet Take 1-2 tablets by mouth every 4 (four) hours as needed for moderate pain. 30 tablet 0 06/29/2018 at 1100  . pantoprazole (PROTONIX) 20 MG tablet Take 1 tablet (20 mg total) by mouth daily. 30 tablet 2 prn at prn  . potassium chloride SA (K-DUR,KLOR-CON) 20 MEQ tablet Take 1 tablet (20 mEq total) by mouth daily. (Patient not taking: Reported on 06/25/2018) 7 tablet 0 Not Taking    No family history on file.   Review of Systems:   Review of Systems  Constitutional: Negative for chills, fever and malaise/fatigue.  Respiratory: Positive for cough and shortness of breath.   Cardiovascular: Positive for chest pain (and chest pressure).  Gastrointestinal: Negative.  Negative for abdominal pain, nausea and vomiting.  Psychiatric/Behavioral: The patient is nervous/anxious.    Pertinent items are noted in HPI.     Physical Exam: BP 109/89 (BP Location: Right Arm)   Pulse (!) 102   Temp 98.2 F (36.8 C) (Oral)   Resp 19   Ht 5\' 9"  (1.753 m)   Wt 59.4 kg   SpO2 100%   BMI 19.34 kg/m    General appearance: alert, cooperative and mild distress Resp: clear to auscultation  bilaterally Cardio: mutted heart sounds GI: soft, non-tender; bowel sounds normal; no masses,  no organomegaly Extremities: extremities normal, atraumatic, no cyanosis or edema Neurologic: Grossly normal Right femoral vein portacath  Diagnostic Studies & Laboratory data:  Result status: Final result  TRANSTHORACIC ECHOCARDIOGRAM REPORT     Patient Name:   Alexander [name] Date of Exam: 06/30/2018 Medical Rec #:  [id number]    Height:       69.0 in Accession #:    [id number]   Weight:       131.0 lb Date of Birth:  November 11, 1965   BSA:          1.73 m Patient Age:    3 years     BP:           96/77 mmHg Patient Gender: M            HR:           97 bpm. Exam Location:  Inpatient    Procedure: 2D echo limited, Color Doppler and Cardiac Doppler  STAT ECHO  Indications:    Tamponade 423.3 / I31.4   History:        Patient has prior history of Echocardiogram examinations, most                 recent 06/30/2018. COPD; Risk Factors: Current Smoker. Pleuritic                 Chest Pain. Shortness of Breath. Lung Cancer. SVC Syndrome.                 Porta Cath in the thigh.   Sonographer:    Darlina Sicilian RDCS Referring Phys: [id number] [place]    1. The left ventricle has normal systolic function of 18-29%. The cavity size is normal. There is concentric left ventricular hypertrophy. Echo evidence of impaired diastolic relaxation.  2. The right ventricle has normal systolic function. The cavity in normal in size. There is no increase in right ventricular wall thickness.  3. Moderate pericardial effusion.  4. The mitral valve is normal in structure There is mild thickening.  5. The aortic valve has an indeterminant number of cusps.  6. The pulmonic valve is Not assessed. Pulmonic valve regurgitation was not assessed by color flow Doppler.  7. The inferior vena cava was dilated in size with <50% respiratory variability.  8. Normal LV function; moderate,  fibrinous, predominantly anterior pericardial effusion; no RA or RV diastolic collapse; IVC dilated; catheter in IVC.  9. No evidence of left ventricular regional wall motion abnormalities. 10. The interatrial septum was not assessed.  FINDINGS  Left Ventricle: The left ventricle has normal systolic function of 93-71%. The cavity size is normal. There is concentric left ventricular hypertrophy. Echo evidence of impaired diastolic relaxation No evidence of left ventricular regional wall motion  abnormalities.. Right Ventricle: The right ventricle has normal systolic function. The cavity in normal in size. There is no increase in right ventricular wall thickness. Left Atrium: is normal in size Right Atrium: is normal in size Interatrial Septum: The interatrial septum was not assessed.  Pericardium: A moderately sized pericardial effusion is present. The pericardial effusion appears to contain fibrous material. Mitral Valve: The mitral valve is normal in structure There is mild thickening. Mitral valve regurgitation is not visualized by color flow Doppler. Tricuspid Valve: The tricuspid valve is normal in structure. Tricuspid valve regurgitation is trivial by color flow Doppler. Aortic Valve: The aortic valve was not well visualized. The aortic valve has an indeterminant number of cusps. Pulmonic Valve: The pulmonic valve was not well visualized. The pulmonic valve is Not assessed. Pulmonic valve regurgitation was not assessed by color flow Doppler. Venous: The inferior vena  cava was dilated in size with less than 50% respiratory variability.          Recent Radiology Findings:   Ct Angio Chest Pe W And/or Wo Contrast  Result Date: 06/29/2018 CLINICAL DATA:  53 y/o M; chest pain and shortness of breath. History of left cancer, last chemotherapy 1 week ago. EXAM: CT ANGIOGRAPHY CHEST WITH CONTRAST TECHNIQUE: Multidetector CT imaging of the chest was performed using the standard protocol during  bolus administration of intravenous contrast. Multiplanar CT image reconstructions and MIPs were obtained to evaluate the vascular anatomy. CONTRAST:  82mL OMNIPAQUE IOHEXOL 350 MG/ML SOLN COMPARISON:  05/31/2018 CT chest. 03/18/2018 PET-CT. FINDINGS: Cardiovascular: Satisfactory opacification of the pulmonary arteries to the segmental level. No evidence of pulmonary embolism. Normal heart size. Moderate pericardial effusion. Mild aortic calcific atherosclerosis. SVC occlusion collaterals throughout the mediastinum and chest wall. Mediastinum/Nodes: Ill-defined confluent right lower paratracheal lymphadenopathy is stable. No new mediastinal or axillary adenopathy. Lungs/Pleura: Spiculated right upper lobe pleural based nodule measures 1.6 x 1.4 cm (series 4, image 14). Clustered nodules in bronchovascular "tree-in-bud" distribution are improved compared with the prior study mild residual. No new consolidation, effusion, or pneumothorax. Centrilobular emphysema. Upper Abdomen: No acute abnormality. Musculoskeletal: No chest wall abnormality. No acute or significant osseous findings. Review of the MIP images confirms the above findings. IMPRESSION: 1. No evidence of pulmonary embolus. 2. New moderate pericardial effusion. 3. Stable right upper lobe 1.6 cm spiculated nodule. 4. Interval improvement in tree-in-bud opacities compatible with bronchitis/bronchiolitis. No new consolidation. 5. Aortic Atherosclerosis (ICD10-I70.0) and Emphysema (ICD10-J43.9). Electronically Signed   By: Kristine Garbe M.D.   On: 06/29/2018 17:34   Dg Chest Port 1 View  Result Date: 06/29/2018 CLINICAL DATA:  Chest pain, shortness of breath. EXAM: PORTABLE CHEST 1 VIEW COMPARISON:  Radiograph of March 12, 2018. FINDINGS: The heart size and mediastinal contours are within normal limits. Both lungs are clear. No pneumothorax or pleural effusion is noted. The visualized skeletal structures are unremarkable. IMPRESSION: No active  disease. Electronically Signed   By: Marijo Conception, M.D.   On: 06/29/2018 16:36     I have independently reviewed the above radiologic studies and discussed with the patient   Recent Lab Findings: Lab Results  Component Value Date   WBC 14.1 (H) 06/30/2018   HGB 8.8 (L) 06/30/2018   HCT 26.0 (L) 06/30/2018   PLT 155 06/30/2018   GLUCOSE 132 (H) 06/30/2018   ALT 11 06/30/2018   AST 19 06/30/2018   NA 135 06/30/2018   K 5.8 (H) 06/30/2018   CL 103 06/30/2018   CREATININE 1.47 (H) 06/30/2018   BUN 20 06/30/2018   CO2 25 06/30/2018   TSH 1.892 06/04/2018   INR 1.19 06/30/2018      Assessment / Plan:      1. Moderate Pericardial Effusion-symptomatic with tachycardia and boarder-line hypotension. On Eliquis at home last dose yesterday morning .  CT scan on 06/29/2018 which showed moderate pericardial effusion, confirmed by Echocardiogram performed on 06/30/2018.  2. Stage IIIB Adenocarcinoma of the right lung-s/p chemotherapy and radiation now on immunotherapy question of maliganant involvement of pericardium kcl was elevated this am, given meds to decrease  With the patient's fairly rapidly progressing symptoms which brought him to the emergency room yesterday of tachycardia shortness of breath borderline hypotension with new pericardial effusion I recommend to the patient that we proceed urgently with subxiphoid pericardial window, both to drain the effusion and also to obtain tissue confirmation if the  pericardial effusion is malignant.  Risks and options were discussed with the patient and his wife in detail,   The goals risks and alternatives of the planned surgical procedure Procedure(s): SUBXYPHOID PERICARDIAL WINDOW (N/A) TRANSESOPHAGEAL ECHOCARDIOGRAM (TEE) (N/A)  have been discussed with the patient in detail. The risks of the procedure including death, infection, stroke, myocardial infarction, bleeding, blood transfusion have [name] been discussed specifically.  I have quoted  Alexander [name] a 3 % of perioperative mortality and a complication rate as high as 20  %. The patient's questions have been answered.Alexander [name] is willing  to proceed with the planned procedure.  [name] MD      [address] [place] [address] Office [phone]   [place]

## 2018-06-30 NOTE — Anesthesia Procedure Notes (Signed)
Arterial Line Insertion Start/End2/09/2018 1:30 PM Performed by: Lowella Dell, CRNA  Patient location: Pre-op. Preanesthetic checklist: patient identified, IV checked, site marked, risks and benefits discussed, surgical consent, monitors and equipment checked, pre-op evaluation, timeout performed and anesthesia consent Lidocaine 1% used for infiltration and patient sedated Right, radial was placed Catheter size: 20 G Hand hygiene performed  and maximum sterile barriers used   Attempts: 1 Procedure performed without using ultrasound guided technique. Following insertion, dressing applied and Biopatch. Post procedure assessment: normal  Patient tolerated the procedure well with no immediate complications.

## 2018-06-30 NOTE — Anesthesia Preprocedure Evaluation (Addendum)
Anesthesia Evaluation  Patient identified by MRN, date of birth, ID band Patient awake    Reviewed: Allergy & Precautions, NPO status , Patient's Chart, lab work & pertinent test results  Airway Mallampati: II  TM Distance: >3 FB Neck ROM: Full    Dental no notable dental hx. (+) Dental Advisory Given   Pulmonary COPD, Current Smoker,  Lung mass Lung CA s/p chemo and radiation    + rhonchi        Cardiovascular  Rhythm:Regular Rate:Tachycardia  SVC syndrome  ECHO:  1. The left ventricle has normal systolic function of 41-32%. The cavity size is normal. There is concentric left ventricular hypertrophy. Echo evidence of impaired diastolic relaxation. 2. The right ventricle has normal systolic function. The cavity in normal in size. There is no increase in right ventricular wall thickness. 3. Moderate pericardial effusion. 4. The mitral valve is normal in structure There is mild thickening. 5. The aortic valve has an indeterminant number of cusps. 6. The pulmonic valve is Not assessed. Pulmonic valve regurgitation was not assessed by color flow Doppler. 7. The inferior vena cava was dilated in size with <50% respiratory variability. 8. Normal LV function; moderate, fibrinous, predominantly anterior pericardial effusion; no RA or RV diastolic collapse; IVC dilated; catheter in IVC. 9. No evidence of left ventricular regional wall motion abnormalities. 10. The interatrial septum was not assessed.     Neuro/Psych negative neurological ROS  negative psych ROS   GI/Hepatic Neg liver ROS, GERD  Medicated,  Endo/Other  negative endocrine ROS  Renal/GU negative Renal ROS     Musculoskeletal negative musculoskeletal ROS (+)   Abdominal   Peds  Hematology  (+) anemia ,   Anesthesia Other Findings PERICARDIAL EFFUSION  Reproductive/Obstetrics                          Anesthesia Physical Anesthesia  Plan  ASA: IV and emergent  Anesthesia Plan: General   Post-op Pain Management:    Induction: Intravenous  PONV Risk Score and Plan: 1 and 2 and Ondansetron, Dexamethasone and Treatment may vary due to age or medical condition  Airway Management Planned: Oral ETT  Additional Equipment: Arterial line, CVP, TEE and Ultrasound Guidance Line Placement  Intra-op Plan:   Post-operative Plan: Possible Post-op intubation/ventilation  Informed Consent: I have reviewed the patients History and Physical, chart, labs and discussed the procedure including the risks, benefits and alternatives for the proposed anesthesia with the patient or authorized representative who has indicated his/her understanding and acceptance.     Dental advisory given  Plan Discussed with: CRNA and Anesthesiologist  Anesthesia Plan Comments: (TEE for intraoperative monitoring only)      Anesthesia Quick Evaluation

## 2018-06-30 NOTE — Progress Notes (Signed)
PCCM interval progress note  Patient examined in PACU post op Underwent subxiphoid pericardial window Extubated, respiratory status is stable  Blood pressure 132/81, pulse 75, temperature 97.7 F (36.5 C), resp. rate 18, height 5\' 9"  (1.753 m), weight 59.4 kg, SpO2 96 %. Gen:      No acute distress HEENT:  EOMI, sclera anicteric Neck:     No masses; no thyromegaly Lungs:    Clear to auscultation bilaterally; normal respiratory effort CV:         Regular rate and rhythm; no murmurs Abd:      + bowel sounds; soft, non-tender; no palpable masses, no distension Ext:    No edema; adequate peripheral perfusion Skin:      Warm and dry; no rash Neuro: Somnolent, arousable, oriented with no focal deficits  Assessment/Plan: Stage III lung adenocarcinoma Pericardial effusion status post window  Keep in ICU overnight Resume eliquis when cleared by CVTS May be able to transfer tomorrow  Marshell Garfinkel MD [facility] 06/30/2018, 5:04 PM

## 2018-06-30 NOTE — Progress Notes (Signed)
  Echocardiogram 2D Echocardiogram limited has been performed.  Darlina Sicilian M 06/30/2018, 7:40 AM

## 2018-06-30 NOTE — Progress Notes (Signed)
Patient ID: Alexander [name], male   DOB: 12-31-1965, 53 y.o.   MRN: [id number] TCTS Evening Rounds:  Hemodynamically stable   Pericardial tube output low.

## 2018-06-30 NOTE — Transfer of Care (Signed)
Immediate Anesthesia Transfer of Care Note  Patient: Alexander [name]  Procedure(s) Performed: SUBXYPHOID PERICARDIAL WINDOW (N/A Chest) TRANSESOPHAGEAL ECHOCARDIOGRAM (TEE) (N/A )  Patient Location: PACU  Anesthesia Type:General  Level of Consciousness: awake, alert  and oriented  Airway & Oxygen Therapy: Patient Spontanous Breathing and Patient connected to face mask oxygen  Post-op Assessment: Report given to RN and Post -op Vital signs reviewed and stable  Post vital signs: Reviewed and stable  Last Vitals:  Vitals Value Taken Time  BP 139/90 06/30/2018  3:26 PM  Temp    Pulse 97 06/30/2018  3:28 PM  Resp 21 06/30/2018  3:28 PM  SpO2 100 % 06/30/2018  3:28 PM  Vitals shown include unvalidated device data.  Last Pain:  Vitals:   06/30/18 1200  TempSrc:   PainSc: 6       Patients Stated Pain Goal: 0 (99/77/41 4239)  Complications: No apparent anesthesia complications

## 2018-06-30 NOTE — Progress Notes (Signed)
Pharmacy Antibiotic Note  Alexander [name] is a 53 y.o. male admitted on 06/30/2018 with a pericardial effusion with concern for tamponade. PMH s/f adenocarcinoma of the lung s/p chemo and radiation, now on immunotherapy. WBC 17.4 on admission. Pharmacy has been consulted for vancomycin and cefepime dosing given leukocytosis in the setting of immunosuppression. WBC down 14.1, currently AF. LA and PCT WNL. Scr 1.47, estimated CrCl ~49 mL/min.  Plan: Increase to cefepime 2g IV q12h Continue vancomycin 1000mg  IV q24h (estimated AUC 460, goal l400-550) F/u clinical status, C&S, renal function, de-escalation, LOT, vancomycin levels as indicated  Height: 5\' 9"  (175.3 cm) Weight: 130 lb 15.3 oz (59.4 kg) IBW/kg (Calculated) : 70.7  Temp (24hrs), Avg:98.2 F (36.8 C), Min:98.1 F (36.7 C), Max:98.4 F (36.9 C)  Recent Labs  Lab 06/29/18 1619 06/30/18 0326 06/30/18 0916  WBC 17.5* 14.1*  --   CREATININE 1.28* 1.47*  --   LATICACIDVEN 1.7 1.3 1.1    Estimated Creatinine Clearance: 48.8 mL/min (A) (by C-G formula based on SCr of 1.47 mg/dL (H)).    Allergies  Allergen Reactions  . Doxycycline Swelling  . Penicillins    Antimicrobials this admission: 2/4 cefepime >>  2/4 canc >>   Microbiology results: 2/4 BCx [facility]): NG < 24h 2/4 UCx [facility]): collected 2/4 MRSA PCR: negative  Thank you for allowing pharmacy to be a part of this patient's care.  Mila Merry Gerarda Fraction, PharmD, [place] PGY2 Infectious Diseases Pharmacy Resident Phone: [phone] 06/30/2018 11:49 AM

## 2018-06-30 NOTE — Progress Notes (Signed)
[place] Progress Note Patient Name: Alexander [name] DOB: May 20, 1966 MRN: [id number]   Date of Service  06/30/2018  HPI/Events of Note  K+ 5.7  eICU Interventions  Lokelma 10 gm po x 1        Zahari Fazzino U [name] 06/30/2018, 5:19 AM

## 2018-06-30 NOTE — Progress Notes (Signed)
Pharmacy Antibiotic Note  Alexander [name] is a 53 y.o. male admitted on 06/30/2018 with sepsis.  Pharmacy has been consulted for Vancomycin and Cefepime dosing.  Pt received Vanc 1500mg  and Cefepime 2gm at [facility] prior to transfer  Plan: Cefepime 1gm IV q12 Vancomycin 1000 mg IV Q 24 hrs. Goal AUC 400-550. Expected AUC: 460 SCr used: 1.28 Will f/u renal function, micro data, and pt's clinical condition Vanc levels prn   Height: 5\' 9"  (175.3 cm) Weight: 130 lb 15.3 oz (59.4 kg) IBW/kg (Calculated) : 70.7  Temp (24hrs), Avg:98.2 F (36.8 C), Min:98.1 F (36.7 C), Max:98.2 F (36.8 C)  Recent Labs  Lab 06/29/18 1619  WBC 17.5*  CREATININE 1.28*  LATICACIDVEN 1.7    Estimated Creatinine Clearance: 56.1 mL/min (A) (by C-G formula based on SCr of 1.28 mg/dL (H)).    Allergies  Allergen Reactions  . Doxycycline Swelling  . Penicillins     Antimicrobials this admission: 2/4 Cefepime >>  2/4 Vanc >>   Microbiology results: 2/4 BCx ([place]): 2/4 UCx ([place]):   2/4 MRSA PCR: negative  Thank you for allowing pharmacy to be a part of this patient's care.  Sherlon Handing, PharmD, BCPS Clinical pharmacist  **Pharmacist phone directory can now be found on [email or web address] ([email or web address]).  Listed under [place]. 06/30/2018 3:36 AM

## 2018-07-01 ENCOUNTER — Inpatient Hospital Stay: Payer: Self-pay

## 2018-07-01 ENCOUNTER — Inpatient Hospital Stay (HOSPITAL_COMMUNITY): Payer: Medicaid Other

## 2018-07-01 ENCOUNTER — Encounter (HOSPITAL_COMMUNITY): Payer: Self-pay | Admitting: Cardiothoracic Surgery

## 2018-07-01 ENCOUNTER — Inpatient Hospital Stay: Payer: Self-pay | Admitting: Oncology

## 2018-07-01 ENCOUNTER — Inpatient Hospital Stay: Payer: Self-pay | Attending: Oncology

## 2018-07-01 LAB — POCT I-STAT 7, (LYTES, BLD GAS, ICA,H+H)
Acid-base deficit: 1 mmol/L (ref 0.0–2.0)
Bicarbonate: 22.7 mmol/L (ref 20.0–28.0)
Bicarbonate: 23.8 mmol/L (ref 20.0–28.0)
Calcium, Ion: 1.2 mmol/L (ref 1.15–1.40)
Calcium, Ion: 1.22 mmol/L (ref 1.15–1.40)
HCT: 26 % — ABNORMAL LOW (ref 39.0–52.0)
HCT: 29 % — ABNORMAL LOW (ref 39.0–52.0)
Hemoglobin: 8.8 g/dL — ABNORMAL LOW (ref 13.0–17.0)
Hemoglobin: 9.9 g/dL — ABNORMAL LOW (ref 13.0–17.0)
O2 Saturation: 95 %
O2 Saturation: 95 %
POTASSIUM: 4.8 mmol/L (ref 3.5–5.1)
Patient temperature: 36
Potassium: 4.7 mmol/L (ref 3.5–5.1)
Sodium: 135 mmol/L (ref 135–145)
Sodium: 136 mmol/L (ref 135–145)
TCO2: 24 mmol/L (ref 22–32)
TCO2: 25 mmol/L (ref 22–32)
pCO2 arterial: 32.1 mmHg (ref 32.0–48.0)
pCO2 arterial: 34.4 mmHg (ref 32.0–48.0)
pH, Arterial: 7.454 — ABNORMAL HIGH (ref 7.350–7.450)
pH, Arterial: 7.447 (ref 7.350–7.450)
pO2, Arterial: 70 mmHg — ABNORMAL LOW (ref 83.0–108.0)
pO2, Arterial: 71 mmHg — ABNORMAL LOW (ref 83.0–108.0)

## 2018-07-01 LAB — PROTIME-INR
INR: 1.2
Prothrombin Time: 15.1 seconds (ref 11.4–15.2)

## 2018-07-01 LAB — PHOSPHORUS: Phosphorus: 4 mg/dL (ref 2.5–4.6)

## 2018-07-01 LAB — BASIC METABOLIC PANEL
Anion gap: 10 (ref 5–15)
BUN: 26 mg/dL — ABNORMAL HIGH (ref 6–20)
CO2: 23 mmol/L (ref 22–32)
Calcium: 8.7 mg/dL — ABNORMAL LOW (ref 8.9–10.3)
Chloride: 104 mmol/L (ref 98–111)
Creatinine, Ser: 1.32 mg/dL — ABNORMAL HIGH (ref 0.61–1.24)
GFR calc Af Amer: >60 mL/min (ref >60)
Glucose, Bld: 140 mg/dL — ABNORMAL HIGH (ref 70–99)
Potassium: 5 mmol/L (ref 3.5–5.1)
Sodium: 137 mmol/L (ref 135–145)

## 2018-07-01 LAB — CBC
HCT: 27.8 % — ABNORMAL LOW (ref 39.0–52.0)
Hemoglobin: 8.8 g/dL — ABNORMAL LOW (ref 13.0–17.0)
MCH: 30.3 pg (ref 26.0–34.0)
MCHC: 31.7 g/dL (ref 30.0–36.0)
MCV: 95.9 fL (ref 80.0–100.0)
Platelets: 154 10*3/uL (ref 150–400)
RBC: 2.9 MIL/uL — AB (ref 4.22–5.81)
RDW: 16 % — ABNORMAL HIGH (ref 11.5–15.5)
WBC: 12 10*3/uL — ABNORMAL HIGH (ref 4.0–10.5)
nRBC: 0 % (ref 0.0–0.2)

## 2018-07-01 LAB — URINE CULTURE: Culture: NO GROWTH

## 2018-07-01 LAB — LD, BODY FLUID (OTHER): LD, Body Fluid: 536 IU/L

## 2018-07-01 LAB — APTT: aPTT: 35 seconds (ref 24–36)

## 2018-07-01 LAB — PROTEIN, BODY FLUID (OTHER): Total Protein, Body Fluid Other: 4.8 g/dL

## 2018-07-01 LAB — GLUCOSE, BODY FLUID OTHER: Glucose, Body Fluid Other: 76 mg/dL

## 2018-07-01 LAB — MAGNESIUM: Magnesium: 2.1 mg/dL (ref 1.7–2.4)

## 2018-07-01 MED ORDER — HEPARIN SOD (PORK) LOCK FLUSH 100 UNIT/ML IV SOLN
500.0000 [IU] | INTRAVENOUS | Status: AC | PRN
  Administered 2018-07-01: 500 [IU]

## 2018-07-01 MED ORDER — ENOXAPARIN SODIUM 40 MG/0.4ML ~~LOC~~ SOLN
40.0000 mg | SUBCUTANEOUS | Status: DC
  Administered 2018-07-01 – 2018-07-03 (×3): 40 mg via SUBCUTANEOUS
  Filled 2018-07-01 (×3): qty 0.4

## 2018-07-01 MED ORDER — IPRATROPIUM BROMIDE 0.02 % IN SOLN
0.5000 mg | Freq: Three times a day (TID) | RESPIRATORY_TRACT | Status: DC
  Administered 2018-07-01 – 2018-07-03 (×7): 0.5 mg via RESPIRATORY_TRACT
  Filled 2018-07-01 (×7): qty 2.5

## 2018-07-01 MED ORDER — LEVALBUTEROL HCL 0.63 MG/3ML IN NEBU
0.6300 mg | INHALATION_SOLUTION | Freq: Three times a day (TID) | RESPIRATORY_TRACT | Status: DC
  Administered 2018-07-01 – 2018-07-03 (×7): 0.63 mg via RESPIRATORY_TRACT
  Filled 2018-07-01 (×7): qty 3

## 2018-07-01 NOTE — Op Note (Signed)
NAMESAMAY, DELCARLO MEDICAL RECORD DS:[id number] ACCOUNT [id number] DATE OF BIRTH:08/09/1965 FACILITY: [facility] LOCATION: [facility] PHYSICIAN:Hadiya Spoerl BServando Snare, MD  OPERATIVE REPORT  DATE OF PROCEDURE:  06/30/2018  PREOPERATIVE DIAGNOSIS:  Moderate pericardial effusion in patient with at least stage IIIB carcinoma of the lung, undergoing current immunotherapy after completion of course of standard chemotherapy and radiation. POSTOPERATIVE DIAGNOSIS PROCEDURE: Subxiphoid pericardial window with drainage of pericardial effusion Surgeon: Martin Majestic, MD First assistant: Ellwood Handler, PA INDICATION:  The patient presented to [facility] the day prior.  He had a known superior vena caval obstruction.  He had been on NOAC.  A right femoral Port-A-Cath had been placed.  The patient sought medical treatment because of increasing  shortness of breath and chest discomfort.  A CT scan was performed that showed increasing pericardial effusion from the previous scan several months prior.  The patient was transferred to [facility] for echocardiogram and further evaluation and  treatment.  Echocardiogram showed moderate pericardial effusion, but without frank echocardiographic changes of significant tamponade.  However, with the patient's increasing discomfort and known increasing pericardial effusion, it was recommended to him  that we proceed with subxiphoid pericardial window, both to help with his symptoms and also to obtain fluid for diagnostic studies and to evaluate if he had malignant involvement of his pericardium.  Risks and options were discussed with the patient and  his wife, and he was agreeable with proceeding.  DESCRIPTION OF PROCEDURE:  Because of his superior vena caval obstruction, a left femoral vein IV was placed and an arterial line for monitoring.  The patient underwent general endotracheal anesthesia without incident.  His hemodynamics remained stable.   TEE probe was placed by  Dr. Tobias Alexander which showed moderate pericardial effusion around the left ventricle.  There was increased pericardial effusion around the right side of the heart and right atrium.  The chest was prepped with Betadine, draped in the  usual sterile manner.  Appropriate timeout was performed.  A small incision was made over the xiphoid process.  Dissection was carried down into the fascia, which was divided.  The tip of the sternum was elevated with a retractor, and we were able to  identify the pericardium, which was somewhat tight.  We opened the pericardium, and 200-250 mL of murky fluid was removed.  Fluid was sent for cytology, cultures, cell count, LDH and protein through the small incision.  There was inferiorly evidence of  pericardial inflammation and fibrinous adhesions.  With the fluid removed and some of the adhesions separated, TEE showed good resolution of the pericardial fluid.  A 28-Blake drain was left in the inferior recess of the pericardium and brought out  through a separate site just to the right of the incision.  The incision was then closed with interrupted 0 Vicryl, running 2-0 Vicryl in subcutaneous tissue and 3-0 subcuticular stitch in skin edges.  Dry dressings were placed.  Sponge and needle count  was reported as correct.  At the completion of the procedure, RF scanning reported clear code.  The patient was awakened and extubated in the operating room and transferred to the recovery room for postoperative care.  Blood loss was minimal.  LN/NUANCE  D:06/30/2018 T:07/01/2018 JOB:[id number]

## 2018-07-01 NOTE — Progress Notes (Signed)
Patient ID: Alexander [name], male   DOB: February 05, 1966, 53 y.o.   MRN: [id number] EVENING ROUNDS NOTE :     [address]       [place] [address]             [phone]                 1 Day Post-Op Procedure(s) (LRB): SUBXYPHOID PERICARDIAL WINDOW (N/A) TRANSESOPHAGEAL ECHOCARDIOGRAM (TEE) (N/A)  Total Length of Stay:  LOS: 1 day  BP 114/79   Pulse (!) 106   Temp 98 F (36.7 C) (Oral)   Resp 20   Ht 5\' 9"  (1.753 m)   Wt 60.2 kg   SpO2 93%   BMI 19.60 kg/m   .Intake/Output      02/06 0701 - 02/07 0700   P.O.    I.V. (mL/kg)    IV Piggyback    Total Intake(mL/kg)    Urine (mL/kg/hr) 300 (0.4)   Blood    Chest Tube    Total Output 300   Net -300         . sodium chloride Stopped (06/30/18 1855)  . ceFEPime (MAXIPIME) IV 2 g (07/01/18 1748)  . potassium chloride    . vancomycin Stopped (06/30/18 1955)     Lab Results  Component Value Date   WBC 12.0 (H) 07/01/2018   HGB 9.9 (L) 07/01/2018   HCT 29.0 (L) 07/01/2018   PLT 154 07/01/2018   GLUCOSE 140 (H) 07/01/2018   ALT 11 06/30/2018   AST 17 06/30/2018   NA 136 07/01/2018   K 4.7 07/01/2018   CL 104 07/01/2018   CREATININE 1.32 (H) 07/01/2018   BUN 26 (H) 07/01/2018   CO2 23 07/01/2018   TSH 1.892 06/04/2018   INR 1.20 07/01/2018   Very little out of chest tube On room air    [name] MD  [place] Office [phone] 07/01/2018 7:22 PM

## 2018-07-01 NOTE — Progress Notes (Addendum)
TCTS DAILY ICU PROGRESS NOTE                   [address]            [place] [address]          [phone]   1 Day Post-Op Procedure(s) (LRB): SUBXYPHOID PERICARDIAL WINDOW (N/A) TRANSESOPHAGEAL ECHOCARDIOGRAM (TEE) (N/A)  Total Length of Stay:  LOS: 1 day   Subjective: Feels much better, breathing much improved  Objective: Vital signs in last 24 hours: Temp:  [97.2 F (36.2 C)-98.6 F (37 C)] 98.1 F (36.7 C) (02/06 0400) Pulse Rate:  [75-107] 98 (02/06 0700) Cardiac Rhythm: Normal sinus rhythm (02/06 0400) Resp:  [13-29] 20 (02/06 0700) BP: (97-138)/(72-96) 111/77 (02/06 0700) SpO2:  [91 %-100 %] 99 % (02/06 0740) Arterial Line BP: (102-155)/(56-88) 120/62 (02/06 0700) Weight:  [60.2 kg] 60.2 kg (02/06 0500)  Filed Weights   06/30/18 0200 07/01/18 0500  Weight: 59.4 kg 60.2 kg    Weight change: 0.792 kg   Hemodynamic parameters for last 24 hours:    Intake/Output from previous day: 02/05 0701 - 02/06 0700 In: 1708.5 [P.O.:240; I.V.:1168.5; IV Piggyback:300] Out: 4403 [Urine:1145; Blood:10; Chest Tube:90]  Intake/Output this shift: No intake/output data recorded.  Current Meds: Scheduled Meds: . acetaminophen  1,000 mg Oral Q6H   Or  . acetaminophen (TYLENOL) oral liquid 160 mg/5 mL  1,000 mg Oral Q6H  . arformoterol  15 mcg Nebulization BID  . bisacodyl  10 mg Oral Daily  . budesonide (PULMICORT) nebulizer solution  0.5 mg Nebulization BID  . ipratropium  0.5 mg Nebulization TID  . levalbuterol  0.63 mg Nebulization TID  . pantoprazole  20 mg Oral Daily  . senna-docusate  1 tablet Oral QHS   Continuous Infusions: . sodium chloride Stopped (06/30/18 1855)  . ceFEPime (MAXIPIME) IV 2 g (07/01/18 0641)  . lactated ringers 50 mL/hr at 06/30/18 1200  . potassium chloride    . vancomycin Stopped (06/30/18 1955)   PRN Meds:.acetaminophen, fentaNYL (SUBLIMAZE) injection, ondansetron (ZOFRAN) IV, oxyCODONE, potassium  chloride  General appearance: alert, cooperative and no distress Heart: regular rate and rhythm and no rub Lungs: clear to auscultation bilaterally Abdomen: benign Extremities: no edema or calf tenderness Wound: dressings CDI  Lab Results: CBC: Recent Labs    06/30/18 1346 07/01/18 0424 07/01/18 0519  WBC 11.7* 12.0*  --   HGB 8.5* 8.8* 9.9*  HCT 27.0* 27.8* 29.0*  PLT 160 154  --    BMET:  Recent Labs    06/30/18 1346 07/01/18 0424 07/01/18 0519  NA 136 137 136  K 4.8 5.0 4.7  CL 104 104  --   CO2 21* 23  --   GLUCOSE 110* 140*  --   BUN 21* 26*  --   CREATININE 1.31* 1.32*  --   CALCIUM 8.6* 8.7*  --     CMET: Lab Results  Component Value Date   WBC 12.0 (H) 07/01/2018   HGB 9.9 (L) 07/01/2018   HCT 29.0 (L) 07/01/2018   PLT 154 07/01/2018   GLUCOSE 140 (H) 07/01/2018   ALT 11 06/30/2018   AST 17 06/30/2018   NA 136 07/01/2018   K 4.7 07/01/2018   CL 104 07/01/2018   CREATININE 1.32 (H) 07/01/2018   BUN 26 (H) 07/01/2018   CO2 23 07/01/2018   TSH 1.892 06/04/2018   INR 1.20 07/01/2018      PT/INR:  Recent Labs  07/01/18 0424  LABPROT 15.1  INR 1.20   Radiology: Dg Chest Port 1 View  Result Date: 07/01/2018 CLINICAL DATA:  Chest tube.  Pericardial effusion. EXAM: PORTABLE CHEST 1 VIEW COMPARISON:  Chest CT from 2 days ago FINDINGS: Normal cardiopericardial size, although also normal when pericardial effusion was seen by CT. Haziness of the lower lungs symmetrically, likely atelectasis. No edema, pneumothorax, or visible pleural fluid. IMPRESSION: 1. Stable cardiopericardial size. No pulmonary edema. 2. Symmetric haziness in the lower chest favoring atelectasis. Electronically Signed   By: Monte Fantasia M.D.   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 Aline, fem venous line 8 no eliquis today, will add lovenox for DVT proph 9 other as per primary team 10 routine activity advancement and pulm toilet  Gaspar Bidding Pager [phone] 07/01/2018 8:10 AM  I have seen and examined Alexander [name] and agree with the above assessment  and plan.  [name] MD [facility] [phone] Office [phone] 07/01/2018 4:44 PM

## 2018-07-01 NOTE — Progress Notes (Addendum)
NAME:  Alexander [name], MRN:  [id number], DOB:  Sep 10, 1965, LOS: 1 ADMISSION DATE:  06/30/2018, CONSULTATION DATE:  06/30/2018 REFERRING MD:  Dr. Joni Fears, CHIEF COMPLAINT:  SOB/ chest pain  Brief History   21 yoM with adenocarcinoma lung CA s/p radiation and chemo presenting with acute onset of SOB and pleuritic chest pain, found to be tachycardic and hypotensive with CTA PE neg for PE but showed moderate pericardial effusion, with overall hemodynamic picture concerning for tamponade physiology.  Transferred from [facility] to [facility] for TCTS eval and further care. PCCM to admit.    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  Reports chest pain was intermittent, substernal exacerbated with cough/ deep breathing; no radiation., found to be afebrile, tachycardic and hypotensive with SBP 80-90's, with normal oxygen saturations.  Initial concern for sepsis vs PE.  He was started on empiric vancomycin and cefepime and given 2L NS.  Labs significant for WBC 17.5, Hgb 10.2, sCr 1.28 (baseline around 1), BUN 18, neg trop x 2, lactic acid 1.7, PCT 0.21.  CTA PE was negative for PE but showed a moderate pericardial effusion and stable right upper lobe 1.6 spiculated nodule.  He was transferred to [facility] for further care and evaluation by TCTS. 2/5 went for pericardial window by thoracic surgery. Post-op w/ significant symptom improvement.  2/6 hemodynamically stable. Now on room air.   Consults:  TCTS Cardiology  Procedures:  PTA right thigh port >>accessed  07/01/2019 operating room for presumed pericardial window  Significant Diagnostic Tests:  2/5 CTA PE >> 1. No evidence of pulmonary embolus. 2. New moderate pericardial effusion. 3. Stable right upper lobe 1.6 cm spiculated nodule. 4. Interval  improvement in tree-in-bud opacities compatible with bronchitis/ bronchiolitis. No new consolidation. 5. Aortic Atherosclerosis and emphysema   CXR 2/5 >> negative  Micro Data:  2/4 BCx 2 >> 2/4 MRSA PCR >> neg 2/4 UC >>neg  Antimicrobials:  2/4 Vancomycin >> 2/4 cefepime >>  Interim history/subjective:  He feels much better Objective   Blood pressure 111/77, pulse 98, temperature 98 F (36.7 C), temperature source Oral, resp. rate 20, height 5\' 9"  (1.753 m), weight 60.2 kg, SpO2 99 %.        Intake/Output Summary (Last 24 hours) at 07/01/2018 5852 Last data filed at 07/01/2018 0600 Gross per 24 hour  Intake 1588.55 ml  Output 1245 ml  Net 343.55 ml   Filed Weights   06/30/18 0200 07/01/18 0500  Weight: 59.4 kg 60.2 kg    Examination: General: This is a 53 year old white male is currently sitting up in the bed he is in no acute distress following his pericardial window HEENT normocephalic atraumatic still continues to have jugular venous distention as a complication of his superior vena cava syndrome mucous membranes are moist Pulmonary: Clear to auscultation Cardiac: Regular rate and rhythm pericardial window drainage system intact dressing clean dry and intact Abdomen: Soft, not tender, no organomegaly Extremities: Warm, dry, bilateral stable and upper extremity edema remains present.  Has a Port-A-Cath right femoral vein Neuro: Awake oriented no GU: Clear yellow urine.  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  Will cont 24-48 more hours.   AKI s/p IV contrast - baseline sCr ~1,  uSG high - 2L IVF given; cr seems to have stabilized  Plan Trend chemistry  Stage IIIb adenocarcinoma of the right lung c/b SVC syndrome - stable size in spiculated RUL nodule on CTA PE  - s/p concurrent chemo/XRT x 4 cycles w/ Cisplatin and alimta.  -  scheduled for next durvalumab dose on 2/6 Plan I messaged Dr Astrid Divine. Re: in-patient recs. She will f/u after dc. Need see cytology to see if this was radiation, immunotherapy or malignancy induced. If not malignancy may need steroids.  Resume DOAC when ok w/ surgery   Anemia - Hgb trend stable Plan Trend CBC  COPD- no prior formal workup/ PFTs, emphysemous changes on CT Current smoker  -  Prn albuterol at home  Plan Cont Nebs (currently on brovana and pulmicort) O2 as needed   Best practice:  Diet: NPO Pain/Anxiety/Delirium protocol (if indicated): n/a VAP protocol (if indicated): n/a DVT prophylaxis: SCDs for now GI prophylaxis: n/a, resume home protonix  Glucose control: trend Mobility: BR Code Status: Discussed with patient, states he has not had GOC discussion prior to.  He states "if I go, let me go, its my time".  He is ok with current treatment but in the event of cardiac or respiratory arrest, he does not want CPR or intubation.  His NOK he reports is Hilda Blades, a sister-in-law.  I encouraged him to share his wishes with her.    Family Communication: 06/30/2017 patient and family updated prior to going to surgery Disposition: Keeping him is on intensive care while he has a pericardial drain.  He is hemodynamically stable.       Erick Colace ACNP-BC [place] Pager # [phone] OR # [phone] if no answer

## 2018-07-02 ENCOUNTER — Inpatient Hospital Stay (HOSPITAL_COMMUNITY): Payer: Medicaid Other

## 2018-07-02 DIAGNOSIS — J9601 Acute respiratory failure with hypoxia: Secondary | ICD-10-CM

## 2018-07-02 LAB — CBC
HEMATOCRIT: 24.8 % — AB (ref 39.0–52.0)
Hemoglobin: 8.1 g/dL — ABNORMAL LOW (ref 13.0–17.0)
MCH: 31.4 pg (ref 26.0–34.0)
MCHC: 32.7 g/dL (ref 30.0–36.0)
MCV: 96.1 fL (ref 80.0–100.0)
PLATELETS: 175 10*3/uL (ref 150–400)
RBC: 2.58 MIL/uL — ABNORMAL LOW (ref 4.22–5.81)
RDW: 16.1 % — AB (ref 11.5–15.5)
WBC: 15.4 10*3/uL — ABNORMAL HIGH (ref 4.0–10.5)
nRBC: 0 % (ref 0.0–0.2)

## 2018-07-02 LAB — COMPREHENSIVE METABOLIC PANEL
ALT: 11 U/L (ref 0–44)
AST: 24 U/L (ref 15–41)
Albumin: 2.6 g/dL — ABNORMAL LOW (ref 3.5–5.0)
Alkaline Phosphatase: 51 U/L (ref 38–126)
Anion gap: 8 (ref 5–15)
BILIRUBIN TOTAL: 0.4 mg/dL (ref 0.3–1.2)
BUN: 23 mg/dL — ABNORMAL HIGH (ref 6–20)
CHLORIDE: 107 mmol/L (ref 98–111)
CO2: 22 mmol/L (ref 22–32)
Calcium: 8.7 mg/dL — ABNORMAL LOW (ref 8.9–10.3)
Creatinine, Ser: 1.34 mg/dL — ABNORMAL HIGH (ref 0.61–1.24)
GFR calc Af Amer: >60 mL/min (ref >60)
Glucose, Bld: 86 mg/dL (ref 70–99)
Potassium: 3.4 mmol/L — ABNORMAL LOW (ref 3.5–5.1)
Sodium: 137 mmol/L (ref 135–145)
Total Protein: 5.6 g/dL — ABNORMAL LOW (ref 6.5–8.1)

## 2018-07-02 MED ORDER — ENSURE ENLIVE PO LIQD
237.0000 mL | Freq: Two times a day (BID) | ORAL | Status: DC
  Administered 2018-07-03: 237 mL via ORAL

## 2018-07-02 MED ORDER — CEFDINIR 300 MG PO CAPS
300.0000 mg | ORAL_CAPSULE | Freq: Two times a day (BID) | ORAL | Status: DC
  Administered 2018-07-02 – 2018-07-03 (×3): 300 mg via ORAL
  Filled 2018-07-02 (×3): qty 1

## 2018-07-02 MED ORDER — POTASSIUM CHLORIDE CRYS ER 20 MEQ PO TBCR
30.0000 meq | EXTENDED_RELEASE_TABLET | Freq: Once | ORAL | Status: AC
  Administered 2018-07-02: 30 meq via ORAL
  Filled 2018-07-02: qty 1

## 2018-07-02 NOTE — Progress Notes (Signed)
Initial Nutrition Assessment  DOCUMENTATION CODES:   Non-severe (moderate) malnutrition in context of chronic illness  INTERVENTION:   Ensure Enlive po BID, each supplement provides 350 kcal and 20 grams of protein   NUTRITION DIAGNOSIS:   Moderate Malnutrition related to chronic illness(cancer) as evidenced by mild fat depletion, mild muscle depletion.  GOAL:   Patient will meet greater than or equal to 90% of their needs  MONITOR:   PO intake, Supplement acceptance, Labs, Weight trends  REASON FOR ASSESSMENT:   Malnutrition Screening Tool    ASSESSMENT:    53 yo male admitted with acute onset of SOB and pleuritic chest pain with large pericardial effusion. Pt with stage III adenocarcinoma of lung s/p concurrent chemo/XRT   2/5 Pericardial Window 2/7 Chest tube removed  Recorded po intake 90-100% of meals. Pt reports very good appetite currently. Eating 2-3 meals at home and drinking 2 Ensure shakes per day at present  Pt reports UBW between 165-175 pounds; current wt 132 pounds. Pt reports he began losing weight in September 2019 when he started cancer treatment. Reports he lost down to 118 pounds from 180 pounds. 34% wt loss. This significant amount of weight loss not reflected in chart but pt has lost some weight. Pt reports when he was going through cancer treatment he could only tolerate chicken pot pies and would eat one a day plus 2 Ensure shakes.  Pt reports he feels very weak Pt appears to still be malnourished but on the mend given increased po intake  Labs: potassium 3.4 (L), Creatinine 1.34, BUN 23 Meds: KCl   NUTRITION - FOCUSED PHYSICAL EXAM:    Most Recent Value  Orbital Region  Mild depletion  Upper Arm Region  Moderate depletion  Thoracic and Lumbar Region  Mild depletion  Buccal Region  Mild depletion  Temple Region  Mild depletion  Clavicle Bone Region  Mild depletion  Clavicle and Acromion Bone Region  Mild depletion  Scapular Bone Region   No depletion  Dorsal Hand  Mild depletion  Patellar Region  Mild depletion  Anterior Thigh Region  Mild depletion  Posterior Calf Region  Mild depletion  Edema (RD Assessment)  Mild [b/l forearm edema]       Diet Order:   Diet Order            Diet regular Room service appropriate? Yes; Fluid consistency: Thin  Diet effective now              EDUCATION NEEDS:   Education needs have been addressed  Skin:  Skin Assessment: Reviewed RN Assessment  Last BM:  2/7  Height:   Ht Readings from Last 1 Encounters:  07/02/18 5\' 9"  (1.753 m)    Weight:   Wt Readings from Last 1 Encounters:  07/02/18 60 kg    BMI:  Body mass index is 19.52 kg/m.  Estimated Nutritional Needs:   Kcal:  1900-2100 kcals  Protein:  95-105 g  Fluid:  >/= 2 L   Kerman Passey MS, RD, LDN, CNSC [phone] Pager  [phone] Weekend/On-Call Pager

## 2018-07-02 NOTE — Progress Notes (Signed)
Patient ID: Alexander [name], male   DOB: [birth year], 53 y.o.   MRN: [id number] TCTS DAILY ICU PROGRESS NOTE                   [address]            [place] [address]          [phone]   2 Days Post-Op Procedure(s) (LRB): SUBXYPHOID PERICARDIAL WINDOW (N/A) TRANSESOPHAGEAL ECHOCARDIOGRAM (TEE) (N/A)  Total Length of Stay:  LOS: 2 days   Subjective: Up to Alexander [name] feels well, better then at admission  Objective: Vital signs in last 24 hours: Temp:  [97.8 F (36.6 C)-98.6 F (37 C)] 98.6 F (37 C) (02/07 0000) Pulse Rate:  [90-126] 96 (02/07 0829) Cardiac Rhythm: Normal sinus rhythm (02/07 0745) Resp:  [12-23] 18 (02/07 0829) BP: (96-122)/(63-99) 117/99 (02/07 0829) SpO2:  [90 %-100 %] 97 % (02/07 0829) Weight:  [60.1 kg] 60.1 kg (02/07 0500)  Filed Weights   06/30/18 0200 07/01/18 0500 07/02/18 0500  Weight: 59.4 kg 60.2 kg 60.1 kg    Weight change: -0.045 kg   Hemodynamic parameters for last 24 hours:    Intake/Output from previous day: 02/06 0701 - 02/07 0700 In: -  Out: 1205 [Urine:1175; Chest Tube:30]  Intake/Output this shift: Total I/O In: 360 [P.O.:360] Out: 2 [Chest Tube:2]  Current Meds: Scheduled Meds: . acetaminophen  1,000 mg Oral Q6H   Or  . acetaminophen (TYLENOL) oral liquid 160 mg/5 mL  1,000 mg Oral Q6H  . arformoterol  15 mcg Nebulization BID  . bisacodyl  10 mg Oral Daily  . budesonide (PULMICORT) nebulizer solution  0.5 mg Nebulization BID  . enoxaparin (LOVENOX) injection  40 mg Subcutaneous Q24H  . ipratropium  0.5 mg Nebulization TID  . levalbuterol  0.63 mg Nebulization TID  . pantoprazole  20 mg Oral Daily  . senna-docusate  1 tablet Oral QHS   Continuous Infusions: . sodium chloride Stopped (06/30/18 1855)  . ceFEPime (MAXIPIME) IV 2 g (07/02/18 1884)  . potassium chloride    . vancomycin 1,000 mg (07/01/18 2009)   PRN Meds:.acetaminophen, fentaNYL (SUBLIMAZE) injection, ondansetron (ZOFRAN) IV, oxyCODONE,  potassium chloride  General appearance: alert, cooperative and no distress Neurologic: intact Heart: regular rate and rhythm, S1, S2 normal, no murmur, click, rub or gallop Lungs: diminished breath sounds bibasilar Abdomen: soft, non-tender; bowel sounds normal; no masses,  no organomegaly Extremities: extremities normal, atraumatic, no cyanosis or edema and Homans sign is negative, no sign of DVT Wound: intact,   Lab Results: CBC: Recent Labs    07/01/18 0424 07/01/18 0519 07/02/18 0437  WBC 12.0*  --  15.4*  HGB 8.8* 9.9* 8.1*  HCT 27.8* 29.0* 24.8*  PLT 154  --  175   BMET:  Recent Labs    07/01/18 0424 07/01/18 0519 07/02/18 0437  NA 137 136 137  K 5.0 4.7 3.4*  CL 104  --  107  CO2 23  --  22  GLUCOSE 140*  --  86  BUN 26*  --  23*  CREATININE 1.32*  --  1.34*  CALCIUM 8.7*  --  8.7*    CMET: Lab Results  Component Value Date   WBC 15.4 (H) 07/02/2018   HGB 8.1 (L) 07/02/2018   HCT 24.8 (L) 07/02/2018   PLT 175 07/02/2018   GLUCOSE 86 07/02/2018   ALT 11 07/02/2018   AST 24 07/02/2018   NA 137 07/02/2018   K  3.4 (L) 07/02/2018   CL 107 07/02/2018   CREATININE 1.34 (H) 07/02/2018   BUN 23 (H) 07/02/2018   CO2 22 07/02/2018   TSH 1.892 06/04/2018   INR 1.20 07/01/2018      PT/INR:  Recent Labs    07/01/18 0424  LABPROT 15.1  INR 1.20   Radiology: Dg Chest Port 1 View  Result Date: 07/02/2018 CLINICAL DATA:  Respiratory failure EXAM: PORTABLE CHEST 1 VIEW COMPARISON:  July 01, 2018 FINDINGS: The lungs are mildly hyperexpanded. There is bibasilar atelectasis. There is questionable small focus of airspace consolidation in the lateral left base. Lungs elsewhere are clear. Heart size and pulmonary vascular normal. No adenopathy. IMPRESSION: Lungs mildly hyperexpanded. Suspect early pneumonia lateral left base. There is associated mild bibasilar atelectasis. Lungs elsewhere clear. Stable cardiac silhouette. Electronically Signed   By: Lowella Grip III M.D.   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

## 2018-07-02 NOTE — Progress Notes (Signed)
PROGRESS NOTE    Alexander [name]  GQQ:[id number] DOB: 05/15/1966 DOA: 06/30/2018 PCP: Patient, No Pcp Per  Brief Narrative: 53 year old male with stage III adeno CA lung status post radiation therapy and chemotherapy/immunotherapy presented with acute onset shortness of breath and pleuritic chest pain on further work-up he was found to have a large pericardial effusion with possible early tamponade physiology and was transferred to [facility] from [facility]. -Admitted to the ICU, T CTS was consulted, underwent pericardial window on 2/5 -Transferred from PCCM to [facility] service today 2/7  Assessment & Plan:     Pericardial effusion with cardiac tamponade -Status post pericardial window on 2/5 with significant symptom improvement -Minimal output through drain -T CTS following, plan to remove pericardial tube today -Pericardial fluid analysis indicated no malignant cells, acute inflammatory cells noted -Recent immunotherapy could have played a role -Mobilize, incentive spirometry, physical therapy  -Will not resume anticoagulation at this time, patient was started on Eliquis for 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 hilar and precarinal adenopathy resulting in thrombosis and narrowing of SVC  Leukocytosis -Suspect this is reactive secondary to pericardial effusion with tamponade -Clinically no evidence of ongoing sepsis all cultures are negative -Afebrile and nontoxic, procalcitonin was low -Discontinue vancomycin and cefepime -Transition to cefdinir for 2 more days  Stage IIIb adenocarcinoma, lung, right ([place]) -Status post concurrent chemo/XRT -Was scheduled for immunotherapy yesterday -FU with oncology Chronic anemia -Due to chronic disease, chemo -Bill, monitor  COPD -Stable, nebs as needed  Acute kidney injury -Recent contrast nephropathy possibly, improved and stable at 1.3 range now  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 -Will hold off on restarting this until oncology  follow-up -No pulmonary embolism on CTA  DVT prophylaxis: SCDs Code Status: FUll Code Family Communication: no family at bedside Disposition Plan: Home in few days  Consultants:   TCTS   Procedures: periCardial window 2/5  Antimicrobials:    Subjective: -Feels better, mild discomfort at the drain site, no significant complaints, no nausea vomiting  Objective: Vitals:   07/02/18 0800 07/02/18 0829 07/02/18 0900 07/02/18 1000  BP: (!) 117/99 (!) 117/99 114/81 110/83  Pulse: (!) 101 96 (!) 101 96  Resp: 15 18 20  (!) 21  Temp:      TempSrc:      SpO2: 97% 97% 95% 91%  Weight:      Height:        Intake/Output Summary (Last 24 hours) at 07/02/2018 1017 Last data filed at 07/02/2018 0857 Gross per 24 hour  Intake 360 ml  Output 1207 ml  Net -847 ml   Filed Weights   06/30/18 0200 07/01/18 0500 07/02/18 0500  Weight: 59.4 kg 60.2 kg 60.1 kg    Examination:  General exam: Appears calm and comfortable, no distress Respiratory system: Poor air movement otherwise clear Cardiovascular system: S1 & S2 heard, RRR Gastrointestinal system: Abdomen is nondistended, soft and nontender.Normal bowel sounds heard. Central nervous system: Alert and oriented. No focal neurological deficits. Extremities: No edema Skin: No rashes, lesions or ulcers Psychiatry: Judgement and insight appear normal. Mood & affect appropriate.     Data Reviewed:   CBC: Recent Labs  Lab 06/29/18 1619 06/30/18 0326  06/30/18 1346 06/30/18 1349 07/01/18 0424 07/01/18 0519 07/02/18 0437  WBC 17.5* 14.1*  --  11.7*  --  12.0*  --  15.4*  NEUTROABS 15.6* 11.4*  --   --   --   --   --   --   HGB 10.2* 9.0*   < >  8.5* 8.8* 8.8* 9.9* 8.1*  HCT 32.3* 29.9*   < > 27.0* 26.0* 27.8* 29.0* 24.8*  MCV 98.8 102.0*  --  98.9  --  95.9  --  96.1  PLT 204 155  --  160  --  154  --  175   < > = values in this interval not displayed.   Basic Metabolic Panel: Recent Labs  Lab 06/29/18 1619 06/30/18 0326   06/30/18 1346 06/30/18 1349 07/01/18 0424 07/01/18 0519 07/02/18 0437  NA 137 137   < > 136 135 137 136 137  K 5.1 5.7*   < > 4.8 4.8 5.0 4.7 3.4*  CL 103 103  --  104  --  104  --  107  CO2 26 25  --  21*  --  23  --  22  GLUCOSE 156* 132*  --  110*  --  140*  --  86  BUN 18 20  --  21*  --  26*  --  23*  CREATININE 1.28* 1.47*  --  1.31*  --  1.32*  --  1.34*  CALCIUM 8.8* 8.7*  --  8.6*  --  8.7*  --  8.7*  MG  --  1.6*  --   --   --  2.1  --   --   PHOS  --  4.4  --   --   --  4.0  --   --    < > = values in this interval not displayed.   GFR: Estimated Creatinine Clearance: 54.2 mL/min (A) (by C-G formula based on SCr of 1.34 mg/dL (H)). Liver Function Tests: Recent Labs  Lab 06/29/18 1619 06/30/18 0326 06/30/18 1346 07/02/18 0437  AST 23 19 17 24   ALT 9 11 11 11   ALKPHOS 71 66 62 51  BILITOT 0.7 0.6 0.6 0.4  PROT 6.9 6.1* 6.1* 5.6*  ALBUMIN 3.5 3.0* 2.8* 2.6*   Recent Labs  Lab 06/29/18 1619  LIPASE 23   No results for input(s): AMMONIA in the last 168 hours. Coagulation Profile: Recent Labs  Lab 06/29/18 1619 06/30/18 0326 06/30/18 1346 07/01/18 0424  INR 1.15 1.19 1.15 1.20   Cardiac Enzymes: Recent Labs  Lab 06/29/18 1619 06/29/18 1906 06/30/18 0623  TROPONINI <0.03 <0.03 <0.03   BNP (last 3 results) No results for input(s): PROBNP in the last 8760 hours. HbA1C: No results for input(s): HGBA1C in the last 72 hours. CBG: Recent Labs  Lab 06/30/18 0201  GLUCAP 118*   Lipid Profile: No results for input(s): CHOL, HDL, LDLCALC, TRIG, CHOLHDL, LDLDIRECT in the last 72 hours. Thyroid Function Tests: No results for input(s): TSH, T4TOTAL, FREET4, T3FREE, THYROIDAB in the last 72 hours. Anemia Panel: No results for input(s): VITAMINB12, FOLATE, FERRITIN, TIBC, IRON, RETICCTPCT in the last 72 hours. Urine analysis:    Component Value Date/Time   COLORURINE YELLOW 06/30/2018 1329   APPEARANCEUR CLEAR 06/30/2018 1329   LABSPEC 1.026  06/30/2018 1329   PHURINE 5.0 06/30/2018 1329   GLUCOSEU NEGATIVE 06/30/2018 1329   HGBUR SMALL (A) 06/30/2018 1329   BILIRUBINUR NEGATIVE 06/30/2018 1329   KETONESUR NEGATIVE 06/30/2018 1329   PROTEINUR NEGATIVE 06/30/2018 1329   NITRITE NEGATIVE 06/30/2018 1329   LEUKOCYTESUR NEGATIVE 06/30/2018 1329   Sepsis Labs: @LABRCNTIP (procalcitonin:4,lacticidven:4)  ) Recent Results (from the past 240 hour(s))  Blood Culture (routine x 2)     Status: None (Preliminary result)   Collection Time: 06/29/18  4:19 PM  Result Value  Ref Range Status   Specimen Description BLOOD PORTA CATH  Final   Special Requests   Final    BOTTLES DRAWN AEROBIC AND ANAEROBIC Blood Culture results may not be optimal due to an excessive volume of blood received in culture bottles   Culture   Final    NO GROWTH 3 DAYS Performed at [facility], [address], [place], [place] [address]    Report Status PENDING  Incomplete  Blood Culture (routine x 2)     Status: None (Preliminary result)   Collection Time: 06/29/18  4:19 PM  Result Value Ref Range Status   Specimen Description BLOOD BLOOD LEFT HAND  Final   Special Requests   Final    BOTTLES DRAWN AEROBIC AND ANAEROBIC Blood Culture adequate volume   Culture   Final    NO GROWTH 3 DAYS Performed at [facility], [address], [place], [place] [address]    Report Status PENDING  Incomplete  Urine culture     Status: None   Collection Time: 06/29/18  4:19 PM  Result Value Ref Range Status   Specimen Description   Final    URINE, RANDOM Performed at [facility], [address] [place], [place] [address]    Special Requests   Final    NONE Performed at [facility], [address] [place], [place] [address]    Culture   Final    NO GROWTH Performed at [facility], [place] [address], [place], [place] [address]    Report Status 07/01/2018 FINAL  Final  MRSA PCR Screening     Status: None   Collection  Time: 06/29/18  5:52 PM  Result Value Ref Range Status   MRSA by PCR NEGATIVE NEGATIVE Final    Comment:        The GeneXpert MRSA Assay (FDA approved for NASAL specimens only), is one component of a comprehensive MRSA colonization surveillance program. It is not intended to diagnose MRSA infection nor to guide or monitor treatment for MRSA infections. Performed at [facility], [address] [place], [place] [address]   MRSA PCR Screening     Status: None   Collection Time: 06/30/18  3:43 AM  Result Value Ref Range Status   MRSA by PCR NEGATIVE NEGATIVE Final    Comment:        The GeneXpert MRSA Assay (FDA approved for NASAL specimens only), is one component of a comprehensive MRSA colonization surveillance program. It is not intended to diagnose MRSA infection nor to guide or monitor treatment for MRSA infections. Performed at [facility], [place] [address] [place], [place] [address]   Culture, body fluid-bottle     Status: None (Preliminary result)   Collection Time: 06/30/18  2:45 PM  Result Value Ref Range Status   Specimen Description FLUID PERICARDIAL  Final   Special Requests BOTTLES DRAWN AEROBIC AND ANAEROBIC  Final   Culture   Final    NO GROWTH 2 DAYS Performed at [facility], [place] [address] [place],  [address]    Report Status PENDING  Incomplete  Gram stain     Status: None   Collection Time: 06/30/18  2:45 PM  Result Value Ref Range Status   Specimen Description FLUID PERICARDIAL  Final   Special Requests NONE  Final   Gram Stain   Final    WBC PRESENT, PREDOMINANTLY PMN NO ORGANISMS SEEN CYTOSPIN SMEAR Performed at [facility], [place] [address], [place], [place] [address]    Report Status 06/30/2018 FINAL  Final         Radiology Studies: Dg Chest Port 1 View  Result Date: 07/02/2018 CLINICAL DATA:  Respiratory failure EXAM: PORTABLE CHEST 1 VIEW COMPARISON:  July 01, 2018 FINDINGS: The lungs are  mildly hyperexpanded. There is bibasilar atelectasis. There is questionable small focus of airspace consolidation in the lateral left base. Lungs elsewhere are clear. Heart size and pulmonary vascular normal. No adenopathy. IMPRESSION: Lungs mildly hyperexpanded. Suspect early pneumonia lateral left base. There is associated mild bibasilar atelectasis. Lungs elsewhere clear. Stable cardiac silhouette. Electronically Signed   By: Lowella Grip III M.D.   On: 07/02/2018 07:50   Dg Chest Port 1 View  Result Date: 07/01/2018 CLINICAL DATA:  Chest tube.  Pericardial effusion. EXAM: PORTABLE CHEST 1 VIEW COMPARISON:  Chest CT from 2 days ago FINDINGS: Normal cardiopericardial size, although also normal when pericardial effusion was seen by CT. Haziness of the lower lungs symmetrically, likely atelectasis. No edema, pneumothorax, or visible pleural fluid. IMPRESSION: 1. Stable cardiopericardial size. No pulmonary edema. 2. Symmetric haziness in the lower chest favoring atelectasis. Electronically Signed   By: Monte Fantasia M.D.   On: 07/01/2018 08:04        Scheduled Meds: . acetaminophen  1,000 mg Oral Q6H   Or  . acetaminophen (TYLENOL) oral liquid 160 mg/5 mL  1,000 mg Oral Q6H  . arformoterol  15 mcg Nebulization BID  . bisacodyl  10 mg Oral Daily  . budesonide (PULMICORT) nebulizer solution  0.5 mg Nebulization BID  . enoxaparin (LOVENOX) injection  40 mg Subcutaneous Q24H  . ipratropium  0.5 mg Nebulization TID  . levalbuterol  0.63 mg Nebulization TID  . pantoprazole  20 mg Oral Daily  . senna-docusate  1 tablet Oral QHS   Continuous Infusions: . sodium chloride Stopped (06/30/18 1855)  . ceFEPime (MAXIPIME) IV 2 g (07/02/18 5997)  . potassium chloride    . vancomycin 1,000 mg (07/01/18 2009)     LOS: 2 days    Time spent: 96min    Domenic Polite, MD [facility]  07/02/2018, 10:17 AM

## 2018-07-02 NOTE — Care Management Note (Signed)
Case Management Note  Patient Details  Name: Terrell Ostrand MRN: [id number] Date of Birth: [birth year]  Subjective/Objective:  53 yo male presented from [facility] with pericardial effusion; s/p pericardial window 06/30/18. PMH: stage III adeno CA lung status post radiation therapy and chemotherapy/immunotherapy                  Action/Plan: CM met with patient to discuss dispositional needs. Patient lives at home, independent with no AD in use. Patient reports having no established PCP/active health insurance. CM offered to arrange a hospital f/u appointment to establish a PCP, with patient declining. Patient states Dr. Rita Ohara ([place]) is fully managing his healthcare needs; patient reports being enrolled in the Eliquis patient assistance program, with no other Rx needs. No further needs from CM at this time, but will continue to follow.   Expected Discharge Date:                  Expected Discharge Plan:  Home/Self Care  In-House Referral:  NA  Discharge planning Services  CM Consult  Post Acute Care Choice:  NA Choice offered to:  NA  DME Arranged:  N/A DME Agency:  NA  HH Arranged:  NA HH Agency:  NA  Status of Service:  In process, will continue to follow  If discussed at Long Length of Stay Meetings, dates discussed:    Additional Comments:  Midge Minium RN, BSN, NCM-BC, ACM-RN [phone] 07/02/2018, 12:24 PM

## 2018-07-02 NOTE — Discharge Instructions (Signed)
Discharge Instructions:  1. You may shower, please wash incisions daily with soap and water and keep dry.  If you wish to cover wounds with dressing you may do so but please keep clean and change daily.  No tub baths or swimming until incisions have completely healed.  If your incisions become red or develop any drainage please call our office at [phone]  2. No Driving for one week and  until  you are no longer using narcotic pain medications  3. Monitor your weight daily.. Please use the same scale and weigh at same time... If you gain 3 lbs in 48 hours with associated lower extremity swelling, please contact our office at [phone]  4. Fever of 101.0 , please contact our office at [phone]  5. Activity- up as tolerated, please walk at least 3 times per day.  Avoid strenuous activity, no lifting, pushing,  6. If any questions or concerns arise, please do not hesitate to contact our office at [phone]

## 2018-07-03 DIAGNOSIS — E440 Moderate protein-calorie malnutrition: Secondary | ICD-10-CM

## 2018-07-03 DIAGNOSIS — J9600 Acute respiratory failure, unspecified whether with hypoxia or hypercapnia: Secondary | ICD-10-CM

## 2018-07-03 LAB — BASIC METABOLIC PANEL
ANION GAP: 8 (ref 5–15)
BUN: 14 mg/dL (ref 6–20)
CO2: 24 mmol/L (ref 22–32)
Calcium: 8.8 mg/dL — ABNORMAL LOW (ref 8.9–10.3)
Chloride: 107 mmol/L (ref 98–111)
Creatinine, Ser: 1.3 mg/dL — ABNORMAL HIGH (ref 0.61–1.24)
GFR calc Af Amer: >60 mL/min (ref >60)
GFR calc non Af Amer: >60 mL/min (ref >60)
GLUCOSE: 81 mg/dL (ref 70–99)
Potassium: 3.6 mmol/L (ref 3.5–5.1)
Sodium: 139 mmol/L (ref 135–145)

## 2018-07-03 LAB — CBC
HCT: 27.3 % — ABNORMAL LOW (ref 39.0–52.0)
Hemoglobin: 8.6 g/dL — ABNORMAL LOW (ref 13.0–17.0)
MCH: 30.6 pg (ref 26.0–34.0)
MCHC: 31.5 g/dL (ref 30.0–36.0)
MCV: 97.2 fL (ref 80.0–100.0)
Platelets: 194 10*3/uL (ref 150–400)
RBC: 2.81 MIL/uL — ABNORMAL LOW (ref 4.22–5.81)
RDW: 15.9 % — ABNORMAL HIGH (ref 11.5–15.5)
WBC: 9.5 10*3/uL (ref 4.0–10.5)
nRBC: 0 % (ref 0.0–0.2)

## 2018-07-03 MED ORDER — ACETAMINOPHEN 500 MG PO TABS: 500.0000 mg | ORAL_TABLET | Freq: Four times a day (QID) | ORAL | 0 refills | Status: DC | PRN

## 2018-07-03 NOTE — Plan of Care (Signed)
  Problem: Respiratory: Goal: Respiratory status will improve Outcome: Progressing

## 2018-07-03 NOTE — Progress Notes (Signed)
Discharge instructions given to patient, all questions and concerns answered and addressed. Patient was educated about the importance of smoking cessation.

## 2018-07-03 NOTE — Progress Notes (Addendum)
      [address]       [place] [address]             [phone]        3 Days Post-Op Procedure(s) (LRB): SUBXYPHOID PERICARDIAL WINDOW (N/A) TRANSESOPHAGEAL ECHOCARDIOGRAM (TEE) (N/A)  Subjective: Patient without complaints this am. He hopes to go home.  Objective: Vital signs in last 24 hours: Temp:  [97.6 F (36.4 C)-98.1 F (36.7 C)] 97.9 F (36.6 C) (02/08 0456) Pulse Rate:  [94-110] 98 (02/08 0456) Cardiac Rhythm: Normal sinus rhythm (02/08 0700) Resp:  [19-21] 20 (02/08 0456) BP: (109-123)/(74-101) 109/81 (02/08 0456) SpO2:  [86 %-98 %] 98 % (02/08 0753) Weight:  [59.4 kg-60 kg] 59.4 kg (02/08 0456)   Current Weight  07/03/18 59.4 kg      Intake/Output from previous day: 02/07 0701 - 02/08 0700 In: 600 [P.O.:600] Out: 612 [Urine:610; Chest Tube:2]   Physical Exam:  Cardiovascular: RRR Pulmonary: Diminished basilar breath sounds Wounds: Dressing removed and wound is clean and dry.  No erythema or signs of infection. Minor sero sanguinous drainage from chest tube site. New dressing applied  Lab Results: CBC: Recent Labs    07/02/18 0437 07/03/18 0632  WBC 15.4* 9.5  HGB 8.1* 8.6*  HCT 24.8* 27.3*  PLT 175 194   BMET:  Recent Labs    07/02/18 0437 07/03/18 0632  NA 137 139  K 3.4* 3.6  CL 107 107  CO2 22 24  GLUCOSE 86 81  BUN 23* 14  CREATININE 1.34* 1.30*  CALCIUM 8.7* 8.8*    PT/INR:  Lab Results  Component Value Date   INR 1.20 07/01/2018   INR 1.15 06/30/2018   INR 1.19 06/30/2018   ABG:  INR: Will add last result for INR, ABG once components are confirmed Will add last 4 CBG results once components are confirmed  Assessment/Plan:  1. CV - SR. Pericardial tube removed yesterday. Likely etiology of pericardial effusion radiation induced pericarditis and NOT malignancy. 2.  Pulmonary - On room air. 3. Anemia-H and H stable at 8.6 and 27.3 this am 4. Creatinine remains 1.3-similar PTA 5. Management  per medicine et al  [facility] M ZimmermanPA-C 07/03/2018,9:12 AM [phone]

## 2018-07-03 NOTE — Discharge Summary (Signed)
Physician Discharge Summary  Zakariya Knickerbocker JKD:[id number] DOB: 03/09/1966 DOA: 06/30/2018  PCP: Patient, No Pcp Per  Admit date: 06/30/2018 Discharge date: 07/03/2018  Time spent: 45 minutes  Recommendations for Outpatient Follow-up:  CVTS 2/13 for staple removal Dr. Servando Snare on 2/27 at 10:30 AM for follow-up Oncology Dr. Janese Banks in 1 to 2 weeks  Discharge Diagnoses:  Active Problems:   Pericardial effusion with cardiac tamponade   Adenocarcinoma, lung, right (HCC)   History of cancer chemotherapy   History of radiation therapy   Malnutrition of moderate degree   Acute respiratory failure [facility])   Discharge Condition: Stable  Diet recommendation: Low-sodium, heart healthy  Filed Weights   07/02/18 0500 07/02/18 1505 07/03/18 0456  Weight: 60.1 kg 60 kg 59.4 kg    History of present illness:  53 year old male with stage IIIb adeno CA lung status post concurrent chemo radiation and immunotherapy presented with acute onset shortness of breath and pleuritic chest pain found to have a large pericardial effusion with early tamponade  Hospital Course:     Pericardial effusion with cardiac tamponade -T CTS was consulted, underwent pericardial window by Dr. 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 Roxan Hockey this morning, okay to restart anticoagulation post drain removal -he has close follow-up set up with [facility] for staple removal next week. -Follow-up  with oncology in 1 to 2 weeks  Leukocytosis -Suspect this is reactive secondary to pericardial effusion with tamponade -Clinically no evidence of ongoing sepsis all cultures are negative -Afebrile and nontoxic, procalcitonin was low -Received vancomycin and cefepime on admission to the ICU for 2 days followed by 1 day of cefdinir, discontinued all antibiotics  Stage IIIb adenocarcinoma, lung, right ([place]) -Status post concurrent chemo/XRT -Was scheduled for immunotherapy 2/6 -FU with oncology  Chronic anemia -Due to chronic disease, chemo  COPD -Stable, nebs as needed  Acute kidney injury -Recent contrast nephropathy possibly, improved and stable at 1.3 range now  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 Unknown Did it involve sudden or severe rash/hives, skin peeling, or any reaction on the inside of your mouth or nose? Unknown Did you need to seek medical attention at a hospital or doctor's office? Unknown When did it last happen?Childhood If all above answers are "NO", may proceed with cephalosporin use.  Medication List    STOP taking these medications   potassium chloride SA 20 MEQ tablet Commonly known as:  K-DUR,KLOR-CON     TAKE these medications   acetaminophen 500 MG tablet Commonly known as:  TYLENOL Take 1 tablet  (500 mg total) by mouth every 6 (six) hours as needed.   apixaban 5 MG Tabs tablet Commonly known as:  ELIQUIS Take 1 tablet (5 mg total) by mouth 2 (two) times daily.   oxyCODONE-acetaminophen 5-325 MG tablet Commonly known as:  PERCOCET/ROXICET Take 1-2 tablets by mouth every 4 (four) hours as needed for moderate pain.   pantoprazole 20 MG tablet Commonly known as:  PROTONIX Take 1 tablet (20 mg total) by mouth daily. What changed:    when to take this  reasons to take this      Allergies  Allergen Reactions  . Doxycycline Swelling  . Penicillins Swelling    Did it involve swelling of the face/tongue/throat, SOB, or low BP? Unknown Did it involve sudden or severe rash/hives, skin peeling, or any reaction on the inside of your mouth or nose? Unknown Did you need to seek medical attention at a hospital or doctor's office? Unknown When did it last happen?Childhood If all above answers are "NO", may proceed with cephalosporin use.   Follow-up Information    [name], MD Follow up.   Specialty:  Cardiothoracic Surgery Why:  Please see discharge instructions for further details of follow-up appointments.  On the date you see Dr. Servando Snare, obtain a chest x-ray at [place] 1/2-hour prior to the appointment.  It is located in the same office complex on the first floor Contact information: [place] [address] [phone]        Sindy Guadeloupe, MD. Schedule an appointment as soon as possible for a visit in 1 week(s).   Specialty:  Oncology Contact information: [address] [address] [phone]            The results of significant diagnostics from this hospitalization (including imaging, microbiology, ancillary and laboratory) are listed below for reference.    Significant Diagnostic Studies: Ct Angio Chest Pe W And/or Wo Contrast  Result Date: 06/29/2018 CLINICAL DATA:  53 y/o M; chest  pain and shortness of breath. History of left cancer, last chemotherapy 1 week ago. EXAM: CT ANGIOGRAPHY CHEST WITH CONTRAST TECHNIQUE: Multidetector CT imaging of the chest was performed using the standard protocol during bolus administration of intravenous contrast. Multiplanar CT image reconstructions and MIPs were obtained to evaluate the vascular anatomy. CONTRAST:  4mL OMNIPAQUE IOHEXOL 350 MG/ML SOLN COMPARISON:  05/31/2018 CT chest. 03/18/2018 PET-CT. FINDINGS: Cardiovascular: Satisfactory opacification of the pulmonary arteries to the segmental level. No evidence of pulmonary embolism. Normal heart size. Moderate pericardial effusion. Mild aortic calcific atherosclerosis. SVC occlusion collaterals throughout the mediastinum and chest wall. Mediastinum/Nodes: Ill-defined confluent right lower paratracheal lymphadenopathy is stable. No new mediastinal or axillary adenopathy. Lungs/Pleura: Spiculated right upper lobe pleural based nodule measures 1.6 x 1.4 cm (series 4, image 14). Clustered nodules in bronchovascular "tree-in-bud" distribution are improved compared with the prior study mild residual. No new consolidation, effusion, or pneumothorax. Centrilobular emphysema. Upper Abdomen: No acute abnormality. Musculoskeletal: No chest wall abnormality. No acute or significant osseous findings. Review of the MIP images confirms the above findings. IMPRESSION: 1. No evidence of pulmonary embolus. 2. New moderate pericardial effusion. 3. Stable right upper lobe 1.6 cm spiculated nodule. 4. Interval improvement in  tree-in-bud opacities compatible with bronchitis/bronchiolitis. No new consolidation. 5. Aortic Atherosclerosis (ICD10-I70.0) and Emphysema (ICD10-J43.9). Electronically Signed   By: Kristine Garbe M.D.   On: 06/29/2018 17:34   Dg Chest Port 1 View  Result Date: 07/02/2018 CLINICAL DATA:  Respiratory failure EXAM: PORTABLE CHEST 1 VIEW COMPARISON:  July 01, 2018 FINDINGS: The lungs are  mildly hyperexpanded. There is bibasilar atelectasis. There is questionable small focus of airspace consolidation in the lateral left base. Lungs elsewhere are clear. Heart size and pulmonary vascular normal. No adenopathy. IMPRESSION: Lungs mildly hyperexpanded. Suspect early pneumonia lateral left base. There is associated mild bibasilar atelectasis. Lungs elsewhere clear. Stable cardiac silhouette. Electronically Signed   By: Lowella Grip III M.D.   On: 07/02/2018 07:50   Dg Chest Port 1 View  Result Date: 07/01/2018 CLINICAL DATA:  Chest tube.  Pericardial effusion. EXAM: PORTABLE CHEST 1 VIEW COMPARISON:  Chest CT from 2 days ago FINDINGS: Normal cardiopericardial size, although also normal when pericardial effusion was seen by CT. Haziness of the lower lungs symmetrically, likely atelectasis. No edema, pneumothorax, or visible pleural fluid. IMPRESSION: 1. Stable cardiopericardial size. No pulmonary edema. 2. Symmetric haziness in the lower chest favoring atelectasis. Electronically Signed   By: Monte Fantasia M.D.   On: 07/01/2018 08:04   Dg Chest Port 1 View  Result Date: 06/29/2018 CLINICAL DATA:  Chest pain, shortness of breath. EXAM: PORTABLE CHEST 1 VIEW COMPARISON:  Radiograph of March 12, 2018. FINDINGS: The heart size and mediastinal contours are within normal limits. Both lungs are clear. No pneumothorax or pleural effusion is noted. The visualized skeletal structures are unremarkable. IMPRESSION: No active disease. Electronically Signed   By: Marijo Conception, M.D.   On: 06/29/2018 16:36    Microbiology: Recent Results (from the past 240 hour(s))  Blood Culture (routine x 2)     Status: None (Preliminary result)   Collection Time: 06/29/18  4:19 PM  Result Value Ref Range Status   Specimen Description BLOOD PORTA CATH  Final   Special Requests   Final    BOTTLES DRAWN AEROBIC AND ANAEROBIC Blood Culture results may not be optimal due to an excessive volume of blood received in  culture bottles   Culture   Final    NO GROWTH 4 DAYS Performed at [facility], [address] [place], [place] [address]    Report Status PENDING  Incomplete  Blood Culture (routine x 2)     Status: None (Preliminary result)   Collection Time: 06/29/18  4:19 PM  Result Value Ref Range Status   Specimen Description BLOOD BLOOD LEFT HAND  Final   Special Requests   Final    BOTTLES DRAWN AEROBIC AND ANAEROBIC Blood Culture adequate volume   Culture   Final    NO GROWTH 4 DAYS Performed at [facility], [address], [place], [place] [address]    Report Status PENDING  Incomplete  Urine culture     Status: None   Collection Time: 06/29/18  4:19 PM  Result Value Ref Range Status   Specimen Description   Final    URINE, RANDOM Performed at [facility], [address], [place], [place] [address]    Special Requests   Final    NONE Performed at [facility], [address] [place], [place] [address]    Culture   Final    NO GROWTH Performed at [facility], [place] [address] [place], [place] [address]  Report Status 07/01/2018 FINAL  Final  MRSA PCR Screening     Status: None   Collection Time: 06/29/18  5:52 PM  Result Value Ref Range Status   MRSA by PCR NEGATIVE NEGATIVE Final    Comment:        The GeneXpert MRSA Assay (FDA approved for NASAL specimens only), is one component of a comprehensive MRSA colonization surveillance program. It is not intended to diagnose MRSA infection nor to guide or monitor treatment for MRSA infections. Performed at [facility], [address] [place], [place] [address]   MRSA PCR Screening     Status: None   Collection Time: 06/30/18  3:43 AM  Result Value Ref Range Status   MRSA by PCR NEGATIVE NEGATIVE Final    Comment:        The GeneXpert MRSA Assay (FDA approved for NASAL specimens only), is one component of a comprehensive MRSA colonization surveillance program.  It is not intended to diagnose MRSA infection nor to guide or monitor treatment for MRSA infections. Performed at [facility], [place] [address] [place], [place] [address]   Culture, body fluid-bottle     Status: None (Preliminary result)   Collection Time: 06/30/18  2:45 PM  Result Value Ref Range Status   Specimen Description FLUID PERICARDIAL  Final   Special Requests BOTTLES DRAWN AEROBIC AND ANAEROBIC  Final   Culture   Final    NO GROWTH 2 DAYS Performed at [facility], [place] [address] [place], [place] [address]    Report Status PENDING  Incomplete  Gram stain     Status: None   Collection Time: 06/30/18  2:45 PM  Result Value Ref Range Status   Specimen Description FLUID PERICARDIAL  Final   Special Requests NONE  Final   Gram Stain   Final    WBC PRESENT, PREDOMINANTLY PMN NO ORGANISMS SEEN CYTOSPIN SMEAR Performed at [facility], [place] [address] [place], [place] [address]    Report Status 06/30/2018 FINAL  Final     Labs: Basic Metabolic Panel: Recent Labs  Lab 06/30/18 0326  06/30/18 1346 06/30/18 1349 07/01/18 0424 07/01/18 0519 07/02/18 0437 07/03/18 0632  NA 137   < > 136 135 137 136 137 139  K 5.7*   < > 4.8 4.8 5.0 4.7 3.4* 3.6  CL 103  --  104  --  104  --  107 107  CO2 25  --  21*  --  23  --  22 24  GLUCOSE 132*  --  110*  --  140*  --  86 81  BUN 20  --  21*  --  26*  --  23* 14  CREATININE 1.47*  --  1.31*  --  1.32*  --  1.34* 1.30*  CALCIUM 8.7*  --  8.6*  --  8.7*  --  8.7* 8.8*  MG 1.6*  --   --   --  2.1  --   --   --   PHOS 4.4  --   --   --  4.0  --   --   --    < > = values in this interval not displayed.   Liver Function Tests: Recent Labs  Lab 06/29/18 1619 06/30/18 0326 06/30/18 1346 07/02/18 0437  AST 23 19 17 24   ALT 9 11 11 11   ALKPHOS 71 66 62 51  BILITOT 0.7 0.6 0.6 0.4  PROT 6.9  6.1* 6.1* 5.6*  ALBUMIN 3.5 3.0* 2.8* 2.6*   Recent Labs  Lab 06/29/18 1619  LIPASE 23   No results for  input(s): AMMONIA in the last 168 hours. CBC: Recent Labs  Lab 06/29/18 1619 06/30/18 0326  06/30/18 1346 06/30/18 1349 07/01/18 0424 07/01/18 0519 07/02/18 0437 07/03/18 0632  WBC 17.5* 14.1*  --  11.7*  --  12.0*  --  15.4* 9.5  NEUTROABS 15.6* 11.4*  --   --   --   --   --   --   --   HGB 10.2* 9.0*   < > 8.5* 8.8* 8.8* 9.9* 8.1* 8.6*  HCT 32.3* 29.9*   < > 27.0* 26.0* 27.8* 29.0* 24.8* 27.3*  MCV 98.8 102.0*  --  98.9  --  95.9  --  96.1 97.2  PLT 204 155  --  160  --  154  --  175 194   < > = values in this interval not displayed.   Cardiac Enzymes: Recent Labs  Lab 06/29/18 1619 06/29/18 1906 06/30/18 0623  TROPONINI <0.03 <0.03 <0.03   BNP: BNP (last 3 results) Recent Labs    06/30/18 0326  BNP 68.1    ProBNP (last 3 results) No results for input(s): PROBNP in the last 8760 hours.  CBG: Recent Labs  Lab 06/30/18 0201  GLUCAP 118*       Signed:  Domenic Polite MD.  [facility] 07/03/2018, 10:37 AM

## 2018-07-03 NOTE — Progress Notes (Signed)
Patient ambulated in the hall without any issues.

## 2018-07-04 ENCOUNTER — Encounter: Payer: Self-pay | Admitting: Oncology

## 2018-07-04 LAB — CULTURE, BLOOD (ROUTINE X 2)
Culture: NO GROWTH
Culture: NO GROWTH
Special Requests: ADEQUATE

## 2018-07-05 ENCOUNTER — Other Ambulatory Visit: Payer: Self-pay | Admitting: *Deleted

## 2018-07-05 DIAGNOSIS — C3411 Malignant neoplasm of upper lobe, right bronchus or lung: Secondary | ICD-10-CM

## 2018-07-05 LAB — CULTURE, BODY FLUID W GRAM STAIN -BOTTLE: Culture: NO GROWTH

## 2018-07-05 LAB — CULTURE, BODY FLUID-BOTTLE

## 2018-07-05 NOTE — Telephone Encounter (Signed)
I would like to see him this week sometime. I will decide if we cans tart immunotherapy or not thereafter.

## 2018-07-08 ENCOUNTER — Encounter (INDEPENDENT_AMBULATORY_CARE_PROVIDER_SITE_OTHER): Payer: Self-pay

## 2018-07-08 DIAGNOSIS — Z4802 Encounter for removal of sutures: Secondary | ICD-10-CM

## 2018-07-09 ENCOUNTER — Other Ambulatory Visit: Payer: Self-pay | Admitting: *Deleted

## 2018-07-09 ENCOUNTER — Inpatient Hospital Stay: Payer: Medicaid Other | Attending: Oncology

## 2018-07-09 ENCOUNTER — Encounter: Payer: Self-pay | Admitting: Oncology

## 2018-07-09 ENCOUNTER — Inpatient Hospital Stay (HOSPITAL_BASED_OUTPATIENT_CLINIC_OR_DEPARTMENT_OTHER): Payer: Medicaid Other | Admitting: Oncology

## 2018-07-09 VITALS — BP 121/80 | HR 106 | Temp 98.1°F | Resp 18 | Wt 135.2 lb

## 2018-07-09 DIAGNOSIS — D6481 Anemia due to antineoplastic chemotherapy: Secondary | ICD-10-CM | POA: Insufficient documentation

## 2018-07-09 DIAGNOSIS — I313 Pericardial effusion (noninflammatory): Secondary | ICD-10-CM

## 2018-07-09 DIAGNOSIS — I3139 Other pericardial effusion (noninflammatory): Secondary | ICD-10-CM

## 2018-07-09 DIAGNOSIS — T451X5A Adverse effect of antineoplastic and immunosuppressive drugs, initial encounter: Secondary | ICD-10-CM

## 2018-07-09 DIAGNOSIS — C3411 Malignant neoplasm of upper lobe, right bronchus or lung: Secondary | ICD-10-CM

## 2018-07-09 LAB — CBC WITH DIFFERENTIAL/PLATELET
Abs Immature Granulocytes: 0.07 10*3/uL (ref 0.00–0.07)
Basophils Absolute: 0.1 10*3/uL (ref 0.0–0.1)
Basophils Relative: 1 %
Eosinophils Absolute: 0.3 10*3/uL (ref 0.0–0.5)
Eosinophils Relative: 3 %
HCT: 27.6 % — ABNORMAL LOW (ref 39.0–52.0)
Hemoglobin: 8.5 g/dL — ABNORMAL LOW (ref 13.0–17.0)
Immature Granulocytes: 1 %
Lymphocytes Relative: 7 %
Lymphs Abs: 0.6 10*3/uL — ABNORMAL LOW (ref 0.7–4.0)
MCH: 30.4 pg (ref 26.0–34.0)
MCHC: 30.8 g/dL (ref 30.0–36.0)
MCV: 98.6 fL (ref 80.0–100.0)
MONOS PCT: 13 %
Monocytes Absolute: 1.1 10*3/uL — ABNORMAL HIGH (ref 0.1–1.0)
Neutro Abs: 6.3 10*3/uL (ref 1.7–7.7)
Neutrophils Relative %: 75 %
Platelets: 172 10*3/uL (ref 150–400)
RBC: 2.8 MIL/uL — ABNORMAL LOW (ref 4.22–5.81)
RDW: 15.4 % (ref 11.5–15.5)
WBC: 8.3 10*3/uL (ref 4.0–10.5)
nRBC: 0 % (ref 0.0–0.2)

## 2018-07-09 LAB — COMPREHENSIVE METABOLIC PANEL
ALK PHOS: 61 U/L (ref 38–126)
ALT: 15 U/L (ref 0–44)
AST: 26 U/L (ref 15–41)
Albumin: 3.4 g/dL — ABNORMAL LOW (ref 3.5–5.0)
Anion gap: 6 (ref 5–15)
BUN: 13 mg/dL (ref 6–20)
CALCIUM: 8.9 mg/dL (ref 8.9–10.3)
CO2: 28 mmol/L (ref 22–32)
CREATININE: 1.2 mg/dL (ref 0.61–1.24)
Chloride: 101 mmol/L (ref 98–111)
GFR calc Af Amer: >60 mL/min (ref >60)
GFR calc non Af Amer: >60 mL/min (ref >60)
Glucose, Bld: 108 mg/dL — ABNORMAL HIGH (ref 70–99)
Potassium: 3.8 mmol/L (ref 3.5–5.1)
Sodium: 135 mmol/L (ref 135–145)
Total Bilirubin: 0.5 mg/dL (ref 0.3–1.2)
Total Protein: 6.8 g/dL (ref 6.5–8.1)

## 2018-07-09 MED ORDER — PREDNISONE 10 MG PO TABS: 10.0000 mg | ORAL_TABLET | ORAL | 0 refills | Status: DC

## 2018-07-09 NOTE — Telephone Encounter (Signed)
Call the patient and let him know that because of all the writing we put in our prescription it would not go through electronically.  I had to fax it and then we got [facility] call [facility] that they did not have it but when we called him back they had the fax and they filled the prescription.  Patient is they are getting it and he says it is ready for him

## 2018-07-09 NOTE — Progress Notes (Signed)
Pt in for follow up, discharged from hospital on Saturday, "had fluid drawn off of heart".  Pt still having swelling in arms and feet and some shortness of breath.  Pt also has tooth abcess and scheduled for root canal Monday.

## 2018-07-12 NOTE — Progress Notes (Signed)
Hematology/Oncology Consult note [facility]  Telephone:([phone] [phone]  Patient Care Team: Patient, No Pcp Per as PCP - General ([place]) Telford Nab, RN as Registered Nurse   Name of the patient: Alexander [name]  [id number]  05-Jan-1966   Date of visit: 07/12/18  Diagnosis- lung adenocarcinoma Stage IIIB cT3 cN2 cM0  Chief complaint/ Reason for visit-post hospital discharge follow-up for pericardial effusion  Heme/Onc history: patient is a 53 year old male with a long-standing history of smoking. He has smoked 1 to 1/2 pack of cigarettes per day for over 30 years. He presented to outside urgent care with some symptoms of shortness of breath and was treated for URI. He subsequently presented with facial and neck swelling which was thought by outside ER to be secondary to a drug reaction. Following that patient came to ER here at [facility].  He underwent CT chest abdomen and pelvis which showed 2.1 x 2 cm irregular mass in the right upper lobe concerning for malignancy.. Multiple collateral veins in chest and abdomen probable thrombosis of the left brachiocephalic vein. This is consistent with IVC syndrome Probable right paratracheal right hilar and precarinal adenopathy resulting in severe narrowing and thrombosis of SVC. This is consistent with SVC syndrome  Patient was seen by vascular surgery and there was no acute need for any endovascular therapies. He is on anticoagulation with Coumadin which has been subsequently changed to Eliquis as an outpatient. Patient initially underwent a bronchoscopy guided biopsy of the mediastinal lymph nodes which was inconclusive and underwent a second IR guided biopsy of the right upper lobe lung mass which was consistent with adenocarcinoma  Patient started radiation treatment on 03/15/2018. Plan is to give 4 cycles of cisplatin and Alimta followed by maintenance durvalumab. Cycle 1 of  cisplatin Alimta given on 03/16/2018.  Patient completed 4 cycles of cisplatin and Alimta along with concurrent radiation on 05/20/2018.  Scan showed partial response.  Cycle 1 of durvalumab given on 06/17/2018.  Patient presented to the ER with hypotension tachycardia and was found to have pericardial effusion 10 days after first dose of durvalumab.  Fluid negative for malignancy   Interval history-patient feels better since his hospital discharge.  Denies any shortness of breath but does report fatigue.  He is currently not taking Eliquis  ECOG PS- 1 Pain scale- 0 Opioid associated constipation- NO  Review of systems- Review of Systems  Constitutional: Positive for malaise/fatigue. Negative for chills, fever and weight loss.  HENT: Negative for congestion, ear discharge and nosebleeds.   Eyes: Negative for blurred vision.  Respiratory: Negative for cough, hemoptysis, sputum production, shortness of breath and wheezing.   Cardiovascular: Negative for chest pain, palpitations, orthopnea and claudication.  Gastrointestinal: Negative for abdominal pain, blood in stool, constipation, diarrhea, heartburn, melena, nausea and vomiting.  Genitourinary: Negative for dysuria, flank pain, frequency, hematuria and urgency.  Musculoskeletal: Negative for back pain, joint pain and myalgias.  Skin: Negative for rash.  Neurological: Negative for dizziness, tingling, focal weakness, seizures, weakness and headaches.  Endo/Heme/Allergies: Does not bruise/bleed easily.  Psychiatric/Behavioral: Negative for depression and suicidal ideas. The patient does not have insomnia.        Allergies  Allergen Reactions  . Doxycycline Swelling  . Penicillins Swelling    Did it involve swelling of the face/tongue/throat, SOB, or low BP? Unknown Did it involve sudden or severe rash/hives, skin peeling, or any reaction on the inside of your mouth or nose? Unknown Did you need to  seek medical attention at a hospital  or doctor's office? Unknown When did it last happen?Childhood If all above answers are "NO", may proceed with cephalosporin use.     Past Medical History:  Diagnosis Date  . Cancer ([place])   . COPD (chronic obstructive pulmonary disease) ([place])   . Lung mass      Past Surgical History:  Procedure Laterality Date  . ENDOBRONCHIAL ULTRASOUND N/A 03/11/2018   Procedure: ENDOBRONCHIAL ULTRASOUND;  Surgeon: Tyler Pita, MD;  Location: [facility] OR[facility];  Service: Cardiopulmonary;  Laterality: N/A;  . none    . PORTA CATH INSERTION N/A 03/12/2018   Procedure: PORTA CATH INSERTION, thigh;  Surgeon: Katha Cabal, MD;  Location: [place] [facility];  Service: Cardiovascular;  Laterality: N/A;  . SUBXYPHOID PERICARDIAL WINDOW N/A 06/30/2018   Procedure: SUBXYPHOID PERICARDIAL WINDOW;  Surgeon: [name], MD;  Location: [place];  Service: Open Heart Surgery;  Laterality: N/A;  . TEE WITHOUT CARDIOVERSION N/A 06/30/2018   Procedure: TRANSESOPHAGEAL ECHOCARDIOGRAM (TEE);  Surgeon: [name], MD;  Location: [place];  Service: Open Heart Surgery;  Laterality: N/A;    Social History   Socioeconomic History  . Marital status: Divorced    Spouse name: Not on file  . Number of children: Not on file  . Years of education: Not on file  . Highest education level: Not on file  Occupational History  . Occupation: Cabin crew  Social Needs  . Financial resource strain: Not on file  . Food insecurity:    Worry: Not on file    Inability: Not on file  . Transportation needs:    Medical: Not on file    Non-medical: Not on file  Tobacco Use  . Smoking status: Current Every Day Smoker    Packs/day: 0.25  . Smokeless tobacco: Never Used  Substance and Sexual Activity  . Alcohol use: Not Currently  . Drug use: Yes    Types: Marijuana  . Sexual activity: Not Currently  Lifestyle  . Physical activity:    Days per week: Not on file    Minutes per session: Not on file  .  Stress: Not on file  Relationships  . Social connections:    Talks on phone: Not on file    Gets together: Not on file    Attends religious service: Not on file    Active member of club or organization: Not on file    Attends meetings of clubs or organizations: Not on file    Relationship status: Not on file  . Intimate partner violence:    Fear of current or ex partner: Not on file    Emotionally abused: Not on file    Physically abused: Not on file    Forced sexual activity: Not on file  Other Topics Concern  . Not on file  Social History Narrative  . Not on file    History reviewed. No pertinent family history.   Current Outpatient Medications:  .  acetaminophen (TYLENOL) 500 MG tablet, Take 1 tablet (500 mg total) by mouth every 6 (six) hours as needed., Disp: , Rfl: 0 .  oxyCODONE-acetaminophen (PERCOCET/ROXICET) 5-325 MG tablet, Take 1-2 tablets by mouth every 4 (four) hours as needed for moderate pain., Disp: 30 tablet, Rfl: 0 .  apixaban (ELIQUIS) 5 MG TABS tablet, Take 1 tablet (5 mg total) by mouth 2 (two) times daily. (Patient not taking: Reported on 07/09/2018), Disp: 60 tablet, Rfl: 3 .  pantoprazole (PROTONIX) 20  MG tablet, Take 1 tablet (20 mg total) by mouth daily. (Patient not taking: Reported on 07/09/2018), Disp: 30 tablet, Rfl: 2 .  predniSONE (DELTASONE) 10 MG tablet, Take 1 tablet (10 mg total) by mouth as directed. Take with food: Start with 60 mg daily x 3 days , then 55 mg daily x 3 days, then 50 mg daily x 3 days, 45 mg daily x 3 days, 40 mg daily x 3 days, 35 mg daily x 3 days, 30 mg daily x 3 days, 25 mg daily x 3 days, 20 mg daily x 3 days, 15 mg daily x 3, 10 mg daily x 3 , 5 mg daily x 3 then complete, Disp: 99 tablet, Rfl: 0  Physical exam:  Vitals:   07/09/18 1121  BP: 121/80  Pulse: (!) 106  Resp: 18  Temp: 98.1 F (36.7 C)  TempSrc: Tympanic  SpO2: 97%  Weight: 135 lb 3 oz (61.3 kg)   Physical Exam Constitutional:      General: He is not in  acute distress. HENT:     Head: Normocephalic and atraumatic.  Eyes:     Pupils: Pupils are equal, round, and reactive to light.  Neck:     Musculoskeletal: Normal range of motion.  Cardiovascular:     Rate and Rhythm: Regular rhythm. Tachycardia present.     Heart sounds: Normal heart sounds.  Pulmonary:     Effort: Pulmonary effort is normal.     Breath sounds: Normal breath sounds.  Abdominal:     General: Bowel sounds are normal.     Palpations: Abdomen is soft.  Skin:    General: Skin is warm and dry.  Neurological:     Mental Status: He is alert and oriented to person, place, and time.      CMP Latest Ref Rng & Units 07/09/2018  Glucose 70 - 99 mg/dL 108(H)  BUN 6 - 20 mg/dL 13  Creatinine 0.61 - 1.24 mg/dL 1.20  Sodium 135 - 145 mmol/L 135  Potassium 3.5 - 5.1 mmol/L 3.8  Chloride 98 - 111 mmol/L 101  CO2 22 - 32 mmol/L 28  Calcium 8.9 - 10.3 mg/dL 8.9  Total Protein 6.5 - 8.1 g/dL 6.8  Total Bilirubin 0.3 - 1.2 mg/dL 0.5  Alkaline Phos 38 - 126 U/L 61  AST 15 - 41 U/L 26  ALT 0 - 44 U/L 15   CBC Latest Ref Rng & Units 07/09/2018  WBC 4.0 - 10.5 K/uL 8.3  Hemoglobin 13.0 - 17.0 g/dL 8.5(L)  Hematocrit 39.0 - 52.0 % 27.6(L)  Platelets 150 - 400 K/uL 172    No images are attached to the encounter.  Ct Angio Chest Pe W And/or Wo Contrast  Result Date: 06/29/2018 CLINICAL DATA:  53 y/o M; chest pain and shortness of breath. History of left cancer, last chemotherapy 1 week ago. EXAM: CT ANGIOGRAPHY CHEST WITH CONTRAST TECHNIQUE: Multidetector CT imaging of the chest was performed using the standard protocol during bolus administration of intravenous contrast. Multiplanar CT image reconstructions and MIPs were obtained to evaluate the vascular anatomy. CONTRAST:  75mL OMNIPAQUE IOHEXOL 350 MG/ML SOLN COMPARISON:  05/31/2018 CT chest. 03/18/2018 PET-CT. FINDINGS: Cardiovascular: Satisfactory opacification of the pulmonary arteries to the segmental level. No evidence of  pulmonary embolism. Normal heart size. Moderate pericardial effusion. Mild aortic calcific atherosclerosis. SVC occlusion collaterals throughout the mediastinum and chest wall. Mediastinum/Nodes: Ill-defined confluent right lower paratracheal lymphadenopathy is stable. No new mediastinal or axillary adenopathy. Lungs/Pleura: Spiculated  right upper lobe pleural based nodule measures 1.6 x 1.4 cm (series 4, image 14). Clustered nodules in bronchovascular "tree-in-bud" distribution are improved compared with the prior study mild residual. No new consolidation, effusion, or pneumothorax. Centrilobular emphysema. Upper Abdomen: No acute abnormality. Musculoskeletal: No chest wall abnormality. No acute or significant osseous findings. Review of the MIP images confirms the above findings. IMPRESSION: 1. No evidence of pulmonary embolus. 2. New moderate pericardial effusion. 3. Stable right upper lobe 1.6 cm spiculated nodule. 4. Interval improvement in tree-in-bud opacities compatible with bronchitis/bronchiolitis. No new consolidation. 5. Aortic Atherosclerosis (ICD10-I70.0) and Emphysema (ICD10-J43.9). Electronically Signed   By: Kristine Garbe M.D.   On: 06/29/2018 17:34   Dg Chest Port 1 View  Result Date: 07/02/2018 CLINICAL DATA:  Respiratory failure EXAM: PORTABLE CHEST 1 VIEW COMPARISON:  July 01, 2018 FINDINGS: The lungs are mildly hyperexpanded. There is bibasilar atelectasis. There is questionable small focus of airspace consolidation in the lateral left base. Lungs elsewhere are clear. Heart size and pulmonary vascular normal. No adenopathy. IMPRESSION: Lungs mildly hyperexpanded. Suspect early pneumonia lateral left base. There is associated mild bibasilar atelectasis. Lungs elsewhere clear. Stable cardiac silhouette. Electronically Signed   By: Lowella Grip III M.D.   On: 07/02/2018 07:50   Dg Chest Port 1 View  Result Date: 07/01/2018 CLINICAL DATA:  Chest tube.  Pericardial effusion.  EXAM: PORTABLE CHEST 1 VIEW COMPARISON:  Chest CT from 2 days ago FINDINGS: Normal cardiopericardial size, although also normal when pericardial effusion was seen by CT. Haziness of the lower lungs symmetrically, likely atelectasis. No edema, pneumothorax, or visible pleural fluid. IMPRESSION: 1. Stable cardiopericardial size. No pulmonary edema. 2. Symmetric haziness in the lower chest favoring atelectasis. Electronically Signed   By: Monte Fantasia M.D.   On: 07/01/2018 08:04   Dg Chest Port 1 View  Result Date: 06/29/2018 CLINICAL DATA:  Chest pain, shortness of breath. EXAM: PORTABLE CHEST 1 VIEW COMPARISON:  Radiograph of March 12, 2018. FINDINGS: The heart size and mediastinal contours are within normal limits. Both lungs are clear. No pneumothorax or pleural effusion is noted. The visualized skeletal structures are unremarkable. IMPRESSION: No active disease. Electronically Signed   By: Marijo Conception, M.D.   On: 06/29/2018 16:36     Assessment and plan- Patient is a 53 y.o. male adenocarcinoma of the right lung stage IIIb CT 3 N2 M0 presenting as SVC syndrome. He is here for post hospital discharge follow-up of pericardial effusion  I have reviewed results of pericardial effusion that was drained at [facility].  I also personally spoke with Dr. Servando Snare.  Patient received his first dose of durvalumab on 06/17/2018 and after about 10 days he had presented to the hospital with hypotension and tachycardia and was found to have pericardial effusion concerning for tamponade.  Around 200 to 250 cc of fluid was drained after pericardial window.  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  There were increased number of neutrophils about 15,096% noted in the fluid.  Protein and glucose was unremarkable.  This likely favors reactive effusion and although it is difficult to a certain if it was radiation versus immunotherapy associated given the findings  of chronic pericarditis noted on pericardial biopsy radiation associated pericardial effusion is a possibility over immunotherapy.  Certainly malignancy still remains a concern despite a negative cytology.  At this time I will start him on empiric steroids starting at 60 mg of prednisone and a slow taper by 5  mg every 3 days to complete 1 month of steroid treatment.  Patient has an upcoming appointment with Dr. Servando Snare who will also be monitoring his echocardiogram.  If there is no evidence of reaccumulation of fluid in 1 month's time I will rechallenge him with durvalumab with close monitoring of his symptoms as well as echocardiogram every 2 to 3 months.  If there is reaccumulation of his pericardial effusion I will stop immunotherapy indefinitely at that point.  I will also be checking a PDL 1 on his initial tumor specimen.  Although the [place] trial showed improvement in progression free survival in all comers regardless of PDL 1 status; the benefit of durvalumab with the PDL 1 is 0% may still be questionable.  I will tentatively see the patient back in 1 month's time with a repeat CBC with differential and CMP to restart cycle 2 of durvalumab.  He will call us in the interim if he has any worsening signs and symptoms such as chest pain shortness of breath lightheadedness or heart palpitations  I have asked him to restart his Eliquis starting tomorrow which would give him a week off anticoagulation since his pericardial fluid drainage  Patient still has moderate anemia likely secondary to chemotherapy which we will continue to monitor   Total face to face encounter time for this patient visit was 40 min. >50% of the time was  spent in counseling and coordination of care.     Visit Diagnosis 1. Malignant neoplasm of upper lobe of right lung ([place])   2. Pericardial effusion   3. Antineoplastic chemotherapy induced anemia      Dr. Randa Evens, MD, MPH [facility] at [facility] [phone] 07/12/2018 3:21 PM

## 2018-07-20 ENCOUNTER — Encounter: Payer: Self-pay | Admitting: Oncology

## 2018-07-21 ENCOUNTER — Other Ambulatory Visit: Payer: Self-pay | Admitting: Cardiothoracic Surgery

## 2018-07-21 DIAGNOSIS — C3491 Malignant neoplasm of unspecified part of right bronchus or lung: Secondary | ICD-10-CM

## 2018-07-22 ENCOUNTER — Ambulatory Visit
Admission: RE | Admit: 2018-07-22 | Discharge: 2018-07-22 | Disposition: A | Discharge status: Home / Self Care | Payer: Self-pay | Source: Ambulatory Visit | Attending: Cardiothoracic Surgery | Admitting: Cardiothoracic Surgery

## 2018-07-22 ENCOUNTER — Other Ambulatory Visit: Payer: Self-pay

## 2018-07-22 ENCOUNTER — Encounter: Payer: Self-pay | Admitting: Cardiothoracic Surgery

## 2018-07-22 ENCOUNTER — Other Ambulatory Visit: Payer: Self-pay | Admitting: *Deleted

## 2018-07-22 ENCOUNTER — Ambulatory Visit (INDEPENDENT_AMBULATORY_CARE_PROVIDER_SITE_OTHER): Payer: Self-pay | Admitting: Cardiothoracic Surgery

## 2018-07-22 VITALS — BP 117/79 | HR 99 | Resp 18 | Ht 69.0 in | Wt 138.6 lb

## 2018-07-22 DIAGNOSIS — C3491 Malignant neoplasm of unspecified part of right bronchus or lung: Secondary | ICD-10-CM

## 2018-07-22 DIAGNOSIS — I313 Pericardial effusion (noninflammatory): Secondary | ICD-10-CM

## 2018-07-22 DIAGNOSIS — Z09 Encounter for follow-up examination after completed treatment for conditions other than malignant neoplasm: Secondary | ICD-10-CM

## 2018-07-22 DIAGNOSIS — I3139 Other pericardial effusion (noninflammatory): Secondary | ICD-10-CM

## 2018-07-22 NOTE — Progress Notes (Signed)
[address]       [place] [address]             [phone]      [place] Medical Record #[id number] Date of Birth: 09-04-1965  Referring: Marshell Garfinkel, MD Primary Care: Patient, No Pcp Per Primary Cardiologist: No primary care provider on file.   Chief Complaint:   POST OP FOLLOW UP DATE OF PROCEDURE:  06/30/2018 PREOPERATIVE DIAGNOSIS:  Moderate pericardial effusion in patient with at least stage IIIB carcinoma of the lung, undergoing current immunotherapy after completion of course of standard chemotherapy and radiation. POSTOPERATIVE DIAGNOSIS PROCEDURE: Subxiphoid pericardial window with drainage of pericardial effusion  History of Present Illness:     Patient returns to the office after his recent drainage of moderate pericardial effusion, but had some complexity to the effusion with loculations.  Drained February 5.  The patient notes he has had significant improvement in his overall respiratory status, says he is breathing better and able to do more.  He is currently on a short course of steroids.  Follow-up chest x-ray done today     Past Medical History:  Diagnosis Date  . Cancer ([place])   . COPD (chronic obstructive pulmonary disease) ([place])   . Lung mass      Social History   Tobacco Use  Smoking Status Current Every Day Smoker  . Packs/day: 0.25  Smokeless Tobacco Never Used    Social History   Substance and Sexual Activity  Alcohol Use Not Currently     Allergies  Allergen Reactions  . Doxycycline Swelling  . Penicillins Swelling    Did it involve swelling of the face/tongue/throat, SOB, or low BP? Unknown Did it involve sudden or severe rash/hives, skin peeling, or any reaction on the inside of your mouth or nose? Unknown Did you need to seek medical attention at a hospital or doctor's office? Unknown When did it last happen?Childhood If all above answers are "NO", may proceed with cephalosporin  use.    Current Outpatient Medications  Medication Sig Dispense Refill  . acetaminophen (TYLENOL) 500 MG tablet Take 1 tablet (500 mg total) by mouth every 6 (six) hours as needed.  0  . oxyCODONE-acetaminophen (PERCOCET/ROXICET) 5-325 MG tablet Take 1-2 tablets by mouth every 4 (four) hours as needed for moderate pain. 30 tablet 0  . pantoprazole (PROTONIX) 20 MG tablet Take 1 tablet (20 mg total) by mouth daily. 30 tablet 2  . predniSONE (DELTASONE) 10 MG tablet Take 1 tablet (10 mg total) by mouth as directed. Take with food: Start with 60 mg daily x 3 days , then 55 mg daily x 3 days, then 50 mg daily x 3 days, 45 mg daily x 3 days, 40 mg daily x 3 days, 35 mg daily x 3 days, 30 mg daily x 3 days, 25 mg daily x 3 days, 20 mg daily x 3 days, 15 mg daily x 3, 10 mg daily x 3 , 5 mg daily x 3 then complete 99 tablet 0  . apixaban (ELIQUIS) 5 MG TABS tablet Take 1 tablet (5 mg total) by mouth 2 (two) times daily. (Patient not taking: Reported on 07/09/2018) 60 tablet 3   No current facility-administered medications for this visit.        Physical Exam: BP 117/79 (BP Location: Right Arm, Patient Position: Sitting, Cuff Size: Large)   Pulse 99   Resp 18   Ht 5\' 9"  (1.753 m)  Wt 138 lb 9.6 oz (62.9 kg)   SpO2 95% Comment: RA  BMI 20.47 kg/m   General appearance: alert, cooperative, appears stated age and no distress Neurologic: intact Heart: regular rate and rhythm, S1, S2 normal, no murmur, click, rub or gallop Lungs: clear to auscultation bilaterally Abdomen: soft, non-tender; bowel sounds normal; no masses,  no organomegaly Extremities: extremities normal, atraumatic, no cyanosis or edema and Homans sign is negative, no sign of DVT Wound: Subxiphoid incision is well-healed Patient has significant jugular venous distention bilaterally, with prominent veins across the chest all present preoperatively 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  Diagnostic Studies & Laboratory  data:     Recent Radiology Findings:   Dg Chest 2 View  Result Date: 07/22/2018 CLINICAL DATA:  History of right lung cancer.  No complaints. EXAM: CHEST - 2 VIEW COMPARISON:  July 02, 2018 FINDINGS: The heart size and mediastinal contours are within normal limits. Both lungs are clear. Mild hyperinflated lungs. The visualized skeletal structures are stable. IMPRESSION: No active cardiopulmonary disease. Electronically Signed   By: Abelardo Diesel M.D.   On: 07/22/2018 10:25   I have independently reviewed the above radiology studies  and reviewed the findings with the patient.    Recent Lab Findings: Lab Results  Component Value Date   WBC 8.3 07/09/2018   HGB 8.5 (L) 07/09/2018   HCT 27.6 (L) 07/09/2018   PLT 172 07/09/2018   GLUCOSE 108 (H) 07/09/2018   ALT 15 07/09/2018   AST 26 07/09/2018   NA 135 07/09/2018   K 3.8 07/09/2018   CL 101 07/09/2018   CREATININE 1.20 07/09/2018   BUN 13 07/09/2018   CO2 28 07/09/2018   TSH 1.892 06/04/2018   INR 1.20 07/01/2018      Assessment / Plan:   Patient with recent respiratory distress and moderate pericardial effusion drained with subxiphoid pericardial window-cytologies and pathology related to the drainage did not show evidence of metastatic spread of known non-small cell lung cancer.  Overall the patient is making good progress following his drainage his functional status has improved.  His chest x-ray shows normal cardiac silhouette and without evidence of pleural effusions.  We will for a follow-up echocardiogram on March 10 or 11 to be done at [facility], prior to him being seen in the oncology clinic March 13 to continue with his immune therapy.  I have not made him a return appointment to be seen in the surgery office in [place] but would be glad to see him at any time.   Cancer Staging Lung cancer [facility] [address]) Staging form: Lung, AJCC 8th Edition - Clinical stage from 03/16/2018: Stage IIIB (cT3, cN2, cM0) - Signed by Sindy Guadeloupe, MD on 03/16/2018     [name] MD      [address] [place] [address] Office [phone]   Beeper [phone]  07/22/2018 10:48 AM

## 2018-08-05 ENCOUNTER — Ambulatory Visit
Admission: RE | Admit: 2018-08-05 | Discharge: 2018-08-05 | Disposition: A | Discharge status: Home / Self Care | Payer: Medicaid Other | Source: Ambulatory Visit | Attending: Cardiothoracic Surgery | Admitting: Cardiothoracic Surgery

## 2018-08-05 ENCOUNTER — Ambulatory Visit
Admission: RE | Admit: 2018-08-05 | Discharge: 2018-08-05 | Disposition: A | Discharge status: Home / Self Care | Payer: Self-pay | Attending: Cardiothoracic Surgery | Admitting: Cardiothoracic Surgery

## 2018-08-05 ENCOUNTER — Other Ambulatory Visit: Payer: Self-pay

## 2018-08-05 DIAGNOSIS — I3139 Other pericardial effusion (noninflammatory): Secondary | ICD-10-CM

## 2018-08-05 DIAGNOSIS — J449 Chronic obstructive pulmonary disease, unspecified: Secondary | ICD-10-CM | POA: Diagnosis not present

## 2018-08-05 DIAGNOSIS — I313 Pericardial effusion (noninflammatory): Secondary | ICD-10-CM | POA: Insufficient documentation

## 2018-08-05 NOTE — Progress Notes (Signed)
*  PRELIMINARY RESULTS* Echocardiogram 2D Echocardiogram has been performed.  Sherrie Sport 08/05/2018, 10:37 AM

## 2018-08-06 ENCOUNTER — Other Ambulatory Visit: Payer: Self-pay

## 2018-08-06 ENCOUNTER — Inpatient Hospital Stay (HOSPITAL_BASED_OUTPATIENT_CLINIC_OR_DEPARTMENT_OTHER): Payer: Medicaid Other | Admitting: Oncology

## 2018-08-06 ENCOUNTER — Inpatient Hospital Stay: Payer: Medicaid Other

## 2018-08-06 ENCOUNTER — Inpatient Hospital Stay: Payer: Medicaid Other | Attending: Oncology

## 2018-08-06 ENCOUNTER — Encounter: Payer: Self-pay | Admitting: Oncology

## 2018-08-06 VITALS — BP 109/79 | HR 108 | Temp 98.1°F | Resp 16 | Ht 68.0 in | Wt 137.5 lb

## 2018-08-06 DIAGNOSIS — Z95828 Presence of other vascular implants and grafts: Secondary | ICD-10-CM

## 2018-08-06 DIAGNOSIS — C3411 Malignant neoplasm of upper lobe, right bronchus or lung: Secondary | ICD-10-CM | POA: Diagnosis present

## 2018-08-06 DIAGNOSIS — I313 Pericardial effusion (noninflammatory): Secondary | ICD-10-CM | POA: Insufficient documentation

## 2018-08-06 DIAGNOSIS — Z7189 Other specified counseling: Secondary | ICD-10-CM

## 2018-08-06 LAB — COMPREHENSIVE METABOLIC PANEL
ALT: 19 U/L (ref 0–44)
ANION GAP: 8 (ref 5–15)
AST: 16 U/L (ref 15–41)
Albumin: 4.1 g/dL (ref 3.5–5.0)
Alkaline Phosphatase: 71 U/L (ref 38–126)
BUN: 22 mg/dL — ABNORMAL HIGH (ref 6–20)
CO2: 27 mmol/L (ref 22–32)
Calcium: 8.9 mg/dL (ref 8.9–10.3)
Chloride: 101 mmol/L (ref 98–111)
Creatinine, Ser: 1.35 mg/dL — ABNORMAL HIGH (ref 0.61–1.24)
GFR calc non Af Amer: 60 mL/min — ABNORMAL LOW (ref >60)
Glucose, Bld: 83 mg/dL (ref 70–99)
Potassium: 4.6 mmol/L (ref 3.5–5.1)
Sodium: 136 mmol/L (ref 135–145)
Total Bilirubin: 0.6 mg/dL (ref 0.3–1.2)
Total Protein: 7.2 g/dL (ref 6.5–8.1)

## 2018-08-06 LAB — TSH: TSH: 1.176 u[IU]/mL (ref 0.350–4.500)

## 2018-08-06 LAB — CBC WITH DIFFERENTIAL/PLATELET
Abs Immature Granulocytes: 0.04 10*3/uL (ref 0.00–0.07)
Basophils Absolute: 0 10*3/uL (ref 0.0–0.1)
Basophils Relative: 1 %
Eosinophils Absolute: 0.1 10*3/uL (ref 0.0–0.5)
Eosinophils Relative: 1 %
HEMATOCRIT: 34 % — AB (ref 39.0–52.0)
Hemoglobin: 10.9 g/dL — ABNORMAL LOW (ref 13.0–17.0)
Immature Granulocytes: 1 %
LYMPHS ABS: 0.9 10*3/uL (ref 0.7–4.0)
Lymphocytes Relative: 12 %
MCH: 31.3 pg (ref 26.0–34.0)
MCHC: 32.1 g/dL (ref 30.0–36.0)
MCV: 97.7 fL (ref 80.0–100.0)
Monocytes Absolute: 0.6 10*3/uL (ref 0.1–1.0)
Monocytes Relative: 8 %
Neutro Abs: 6.1 10*3/uL (ref 1.7–7.7)
Neutrophils Relative %: 77 %
Platelets: 158 10*3/uL (ref 150–400)
RBC: 3.48 MIL/uL — ABNORMAL LOW (ref 4.22–5.81)
RDW: 14 % (ref 11.5–15.5)
WBC: 7.7 10*3/uL (ref 4.0–10.5)
nRBC: 0 % (ref 0.0–0.2)

## 2018-08-06 MED ORDER — HEPARIN SOD (PORK) LOCK FLUSH 100 UNIT/ML IV SOLN
500.0000 [IU] | Freq: Once | INTRAVENOUS | Status: AC
  Administered 2018-08-06: 500 [IU] via INTRAVENOUS

## 2018-08-06 MED ORDER — SODIUM CHLORIDE 0.9% FLUSH
10.0000 mL | Freq: Once | INTRAVENOUS | Status: AC
  Administered 2018-08-06: 10 mL via INTRAVENOUS
  Filled 2018-08-06: qty 10

## 2018-08-06 NOTE — Progress Notes (Signed)
Patient here pretreatment. He has no complaints today and reports no changes.

## 2018-08-10 NOTE — Progress Notes (Signed)
Hematology/Oncology Consult note [facility]  [phone] [phone]  Patient Care Team: Patient, No Pcp Per as PCP - General ([place]) Telford Nab, RN as Registered Nurse   Name of the patient: Alexander [name]  [id number]  1966-02-06   Date of visit: 08/10/18  Diagnosis- lung adenocarcinoma Stage IIIB cT3 cN2 cM0  Chief complaint/ Reason for visit-routine follow-up of lung cancer  Heme/Onc history: patient is a 53 year old male with a long-standing history of smoking. He has smoked 1 to 1/2 pack of cigarettes per day for over 30 years. He presented to outside urgent care with some symptoms of shortness of breath and was treated for URI. He subsequently presented with facial and neck swelling which was thought by outside ER to be secondary to a drug reaction. Following that patient came to ER here at [facility]  He underwent CT chest abdomen and pelvis which showed 2.1 x 2 cm irregular mass in the right upper lobe concerning for malignancy.. Multiple collateral veins in chest and abdomen probable thrombosis of the left brachiocephalic vein. This is consistent with IVC syndrome Probable right paratracheal right hilar and precarinal adenopathy resulting in severe narrowing and thrombosis of SVC. This is consistent with SVC syndrome  Patient was seen by vascular surgery and there was no acute need for any endovascular therapies. He is on anticoagulation with Coumadin which has been subsequently changed to Eliquis as an outpatient. Patient initially underwent a bronchoscopy guided biopsy of the mediastinal lymph nodes which was inconclusive and underwent a second IR guided biopsy of the right upper lobe lung mass which was consistent with adenocarcinoma  Patient started radiation treatment on 03/15/2018. Plan is to give 4 cycles of cisplatin and Alimta followed by maintenance durvalumab. Cycle 1 of cisplatin Alimta given on  03/16/2018.  Patient completed 4 cycles of cisplatin and Alimta along with concurrent radiation on 05/20/2018.  Scan showed partial response.  Cycle 1 of durvalumab given on 06/17/2018.  Patient presented to the ER with hypotension tachycardia and was found to have pericardial effusion 10 days after first dose of durvalumab.  Fluid negative for malignancy  Patient was empirically treated for pericardial effusion probably secondary to immunotherapy versus radiation and completed 1 month of steroids.  Repeat echocardiogram was normal.  PDL 1 testing on his initial tumor specimen showed expression of less than 1%.   Interval history-he feels fatigue and intermittently has pressure sensation in his neck.  Denies any shortness of breath.  ECOG PS- 1 Pain scale- 0 Opioid associated constipation- no  Review of systems- Review of Systems  Constitutional: Positive for malaise/fatigue. Negative for chills, fever and weight loss.  HENT: Negative for congestion, ear discharge and nosebleeds.   Eyes: Negative for blurred vision.  Respiratory: Negative for cough, hemoptysis, sputum production, shortness of breath and wheezing.   Cardiovascular: Negative for chest pain, palpitations, orthopnea and claudication.  Gastrointestinal: Negative for abdominal pain, blood in stool, constipation, diarrhea, heartburn, melena, nausea and vomiting.  Genitourinary: Negative for dysuria, flank pain, frequency, hematuria and urgency.  Musculoskeletal: Negative for back pain, joint pain and myalgias.  Skin: Negative for rash.  Neurological: Negative for dizziness, tingling, focal weakness, seizures, weakness and headaches.  Endo/Heme/Allergies: Does not bruise/bleed easily.  Psychiatric/Behavioral: Negative for depression and suicidal ideas. The patient does not have insomnia.     Allergies  Allergen Reactions  . Doxycycline Swelling  . Penicillins Swelling    Did it involve swelling of the face/tongue/throat, SOB,  or low BP? Unknown Did it involve sudden or severe rash/hives, skin peeling, or any reaction on the inside of your mouth or nose? Unknown Did you need to seek medical attention at a hospital or doctor's office? Unknown When did it last happen?Childhood If all above answers are "NO", may proceed with cephalosporin use.     Past Medical History:  Diagnosis Date  . Cancer ([place])   . COPD (chronic obstructive pulmonary disease) ([place])   . Lung mass      Past Surgical History:  Procedure Laterality Date  . ENDOBRONCHIAL ULTRASOUND N/A 03/11/2018   Procedure: ENDOBRONCHIAL ULTRASOUND;  Surgeon: Tyler Pita, MD;  Location: [facility];  Service: Cardiopulmonary;  Laterality: N/A;  . none    . PORTA CATH INSERTION N/A 03/12/2018   Procedure: PORTA CATH INSERTION, thigh;  Surgeon: Katha Cabal, MD;  Location: [place] [facility];  Service: Cardiovascular;  Laterality: N/A;  . SUBXYPHOID PERICARDIAL WINDOW N/A 06/30/2018   Procedure: SUBXYPHOID PERICARDIAL WINDOW;  Surgeon: [name], MD;  Location: [place];  Service: Open Heart Surgery;  Laterality: N/A;  . TEE WITHOUT CARDIOVERSION N/A 06/30/2018   Procedure: TRANSESOPHAGEAL ECHOCARDIOGRAM (TEE);  Surgeon: [name], MD;  Location: [place];  Service: Open Heart Surgery;  Laterality: N/A;    Social History   Socioeconomic History  . Marital status: Divorced    Spouse name: Not on file  . Number of children: Not on file  . Years of education: Not on file  . Highest education level: Not on file  Occupational History  . Occupation: Cabin crew  Social Needs  . Financial resource strain: Not on file  . Food insecurity:    Worry: Not on file    Inability: Not on file  . Transportation needs:    Medical: Not on file    Non-medical: Not on file  Tobacco Use  . Smoking status: Current Every Day Smoker    Packs/day: 0.25  . Smokeless tobacco: Never Used  Substance and Sexual Activity  . Alcohol use: Not  Currently  . Drug use: Yes    Types: Marijuana  . Sexual activity: Not Currently  Lifestyle  . Physical activity:    Days per week: Not on file    Minutes per session: Not on file  . Stress: Not on file  Relationships  . Social connections:    Talks on phone: Not on file    Gets together: Not on file    Attends religious service: Not on file    Active member of club or organization: Not on file    Attends meetings of clubs or organizations: Not on file    Relationship status: Not on file  . Intimate partner violence:    Fear of current or ex partner: Not on file    Emotionally abused: Not on file    Physically abused: Not on file    Forced sexual activity: Not on file  Other Topics Concern  . Not on file  Social History Narrative  . Not on file    History reviewed. No pertinent family history.   Current Outpatient Medications:  .  acetaminophen (TYLENOL) 500 MG tablet, Take 1 tablet (500 mg total) by mouth every 6 (six) hours as needed., Disp: , Rfl: 0 .  apixaban (ELIQUIS) 5 MG TABS tablet, Take 1 tablet (5 mg total) by mouth 2 (two) times daily., Disp: 60 tablet, Rfl: 3  Physical exam:  Vitals:   08/06/18 0950  08/06/18 0954  BP:  109/79  Pulse:  (!) 108  Resp: 16   Temp:  98.1 F (36.7 C)  TempSrc:  Tympanic  Weight: 137 lb 8 oz (62.4 kg)   Height: 5\' 8"  (1.727 m)    Physical Exam Constitutional:      General: He is not in acute distress. HENT:     Head: Normocephalic and atraumatic.  Eyes:     Pupils: Pupils are equal, round, and reactive to light.  Neck:     Musculoskeletal: Normal range of motion.     Comments: Neck veins distended- chronic Cardiovascular:     Rate and Rhythm: Normal rate and regular rhythm.     Heart sounds: Normal heart sounds.  Pulmonary:     Effort: Pulmonary effort is normal.     Breath sounds: Normal breath sounds.  Abdominal:     General: Bowel sounds are normal.     Palpations: Abdomen is soft.  Skin:    General: Skin is  warm and dry.  Neurological:     Mental Status: He is alert and oriented to person, place, and time.      CMP Latest Ref Rng & Units 08/06/2018  Glucose 70 - 99 mg/dL 83  BUN 6 - 20 mg/dL 22(H)  Creatinine 0.61 - 1.24 mg/dL 1.35(H)  Sodium 135 - 145 mmol/L 136  Potassium 3.5 - 5.1 mmol/L 4.6  Chloride 98 - 111 mmol/L 101  CO2 22 - 32 mmol/L 27  Calcium 8.9 - 10.3 mg/dL 8.9  Total Protein 6.5 - 8.1 g/dL 7.2  Total Bilirubin 0.3 - 1.2 mg/dL 0.6  Alkaline Phos 38 - 126 U/L 71  AST 15 - 41 U/L 16  ALT 0 - 44 U/L 19   CBC Latest Ref Rng & Units 08/06/2018  WBC 4.0 - 10.5 K/uL 7.7  Hemoglobin 13.0 - 17.0 g/dL 10.9(L)  Hematocrit 39.0 - 52.0 % 34.0(L)  Platelets 150 - 400 K/uL 158    No images are attached to the encounter.  Dg Chest 2 View  Result Date: 07/22/2018 CLINICAL DATA:  History of right lung cancer.  No complaints. EXAM: CHEST - 2 VIEW COMPARISON:  July 02, 2018 FINDINGS: The heart size and mediastinal contours are within normal limits. Both lungs are clear. Mild hyperinflated lungs. The visualized skeletal structures are stable. IMPRESSION: No active cardiopulmonary disease. Electronically Signed   By: Abelardo Diesel M.D.   On: 07/22/2018 10:25     Assessment and plan- Patient is a 53 y.o. male adenocarcinoma of the right lung stage IIIB cT 3 N2 M0 presenting as SVC syndrome.  He is here for routine follow-up of lung cancer  1.  Patient had significant pericardial effusion 1 dose after his maintenance durvalumab.  It is unclear if it was secondary to durvalumab or radiation treatment and there was no other etiology apparent.  Patient has completed 1 month of steroid taper.  Although it may be worth re-challenging the patient with durvalumab, PDL 1 testing on his tumor specimen shows expression of less than 1%.  There is a potential risk of reaccumulation of pericardial effusion if durvalumab were to be restarted.  The absolute benefit of maintenance durvalumab been PDL 1  expression is less than 1% is also uncertain. Weighing the risks and benefits, I would recommend holding durvalumab at this time and continue surveillance scans every 3 months.   I will tentatively see him back in 2 months with cbc with diff, cmp and ct scan  prior   Total face to face encounter time for this patient visit was 30 min. >50% of the time was  spent in counseling and coordination of care.      Visit Diagnosis 1. Malignant neoplasm of upper lobe of right lung ([place])   2. Goals of care, counseling/discussion      Dr. Randa Evens, MD, MPH [facility] [phone] 08/10/2018 8:20 AM

## 2018-08-31 ENCOUNTER — Telehealth: Payer: Self-pay | Admitting: *Deleted

## 2018-08-31 NOTE — Telephone Encounter (Addendum)
Per Dr Janese Banks, have patient see [facility] this AM, but call him and question him about Covid symptoms first

## 2018-08-31 NOTE — Telephone Encounter (Signed)
Received call from answering service that Dulce Sellar called to report that patient is vomiting and has diarrhea, he has no energy or appetite. He has fallen several times as well. Please advise

## 2018-08-31 NOTE — Telephone Encounter (Signed)
Called and spoke to patient. He says that starting about 2 weeks ago he had diarrhea 2-3 times a day and vomiting once a day that lasted for approximately 3 to 4 days.  He had friends with similar symptoms and felt this was a "bug".  He says that symptoms resolve spontaneously but over the last week he has continued to feel fatigued and more lightheaded than previously.  He describes symptoms as if he gets a coughing spell or changes position quickly he gets dizzy and near syncopal.  He has not fallen but has had to sit down due to symptoms.  Symptoms resolve at rest.  He says that overall he feels he has improving but that it slow.  He says the dizziness is improving and is not as bad.  He says that he is noticed he is eating and drinking less over the past 2 weeks and he has to make himself to eat as he does not feel hungry.  He does drink throughout the day but overall feels volume is reduced as compared to a month ago.  He has not checked his temperature at home as he does not have a thermometer but feels he does not have a fever or chills.  Cough is persistent and similar to chronic cough symptoms/no worse; he has chronic sputum production with history of COPD.  Denies aches or pains.  Denies shortness of breath.  He is able to take his blood pressure at home and gave reading of 129/80, heart rate 102 and says this is normal for him. Patient is heard coughing on the phone. We discussed initiating oral rehydration protocol, ideally with oral rehydration solution such as pedialyte, ceralyte, etc, as opposed to juices/broths/water/gatorade but if that is what is available then to proceed. Advised that in general oral rehydration is  roughly a half cup every 15 to 30 minutes.  Patient feels this is acceptable, verbalizes understanding, and agrees to plan of care.   I called his friend Hilda Blades who had called in reporting his symptoms. He had given me the ok ot speak to her on his behalf and says 'she takes care of  him'.  She says that she was unaware of the diarrhea and vomiting but knew that friends have been sick.  She never required the symptoms.  She says that she has to stay on him to eat and drink and encourages him to drink his Ensure. She feels he has been losing weight and says he rarely eats a full meal.  He lives alone and does not have immediate family.  Due to social distancing, she is worried about him being at home alone.  We discussed oral rehydration protocol which she agrees is acceptable. Discussed ways to have family connect with him for check ups while social distancing. Discussed setting up a webex visit for tomorrow to re-evaluate him and wills end scheduling message to coordinate. If persistent symptoms will consider urgent care vs [facility] for labs (cbc, cmp). Could also consider maintenance inhalers for copd or home nebulizers in the future.

## 2018-09-14 ENCOUNTER — Encounter: Payer: Self-pay | Admitting: Oncology

## 2018-09-14 DIAGNOSIS — C3411 Malignant neoplasm of upper lobe, right bronchus or lung: Secondary | ICD-10-CM

## 2018-09-15 NOTE — Telephone Encounter (Signed)
We will move up his ct scan to this week. Ct neck chest abdomen pelvis with contrast and see me next week after the scans

## 2018-09-17 ENCOUNTER — Ambulatory Visit
Admission: RE | Admit: 2018-09-17 | Discharge: 2018-09-17 | Disposition: A | Discharge status: Home / Self Care | Payer: Medicaid Other | Source: Ambulatory Visit | Attending: Oncology | Admitting: Oncology

## 2018-09-17 ENCOUNTER — Other Ambulatory Visit: Payer: Self-pay

## 2018-09-17 DIAGNOSIS — C3411 Malignant neoplasm of upper lobe, right bronchus or lung: Secondary | ICD-10-CM | POA: Diagnosis present

## 2018-09-17 MED ORDER — IOHEXOL 300 MG/ML  SOLN
100.0000 mL | Freq: Once | INTRAMUSCULAR | Status: AC | PRN
  Administered 2018-09-17: 100 mL via INTRAVENOUS

## 2018-09-20 ENCOUNTER — Other Ambulatory Visit: Payer: Self-pay | Admitting: Oncology

## 2018-09-20 ENCOUNTER — Encounter: Payer: Self-pay | Admitting: Oncology

## 2018-09-20 ENCOUNTER — Other Ambulatory Visit: Payer: Self-pay

## 2018-09-20 ENCOUNTER — Inpatient Hospital Stay: Payer: Medicaid Other | Attending: Oncology | Admitting: Oncology

## 2018-09-20 DIAGNOSIS — Z9221 Personal history of antineoplastic chemotherapy: Secondary | ICD-10-CM

## 2018-09-20 DIAGNOSIS — Z923 Personal history of irradiation: Secondary | ICD-10-CM

## 2018-09-20 DIAGNOSIS — I871 Compression of vein: Secondary | ICD-10-CM

## 2018-09-20 DIAGNOSIS — C3411 Malignant neoplasm of upper lobe, right bronchus or lung: Secondary | ICD-10-CM | POA: Diagnosis not present

## 2018-09-20 NOTE — Progress Notes (Signed)
Patient contacted via phone for webex meeting. No concerns voiced. Continues taking Eliquis.

## 2018-09-23 ENCOUNTER — Encounter: Payer: Self-pay | Admitting: *Deleted

## 2018-09-23 ENCOUNTER — Other Ambulatory Visit: Payer: Self-pay

## 2018-09-23 ENCOUNTER — Ambulatory Visit: Payer: Self-pay

## 2018-09-23 DIAGNOSIS — C3490 Malignant neoplasm of unspecified part of unspecified bronchus or lung: Secondary | ICD-10-CM

## 2018-09-23 NOTE — Progress Notes (Signed)
  Oncology Nurse Navigator Documentation  Navigator Location: [facility] (09/23/18 1300)   )Navigator Encounter Type: Telephone (09/23/18 1300) Telephone: Alexander [name] Call;Appt Confirmation/Clarification (09/23/18 1300)                       Barriers/Navigation Needs: Coordination of Care (09/23/18 1300)   Interventions: Coordination of Care (09/23/18 1300)   Coordination of Care: Appts;Radiology;Broncoscopy (09/23/18 1300)       Spoke with patient and informed of MD recommendations from case conference discussion. Reviewed upcoming appt for PET scan on 5/7 and to expect phone call from [facility] to see Dr. Patsey Berthold for biopsy planning. Also, informed pt of recommendations from Dr. Lucky Cowboy regarding distended veins in neck and abdomen. Pt verbalized understanding and asked that I give his friend, Hilda Blades, a call to inform of recommendations as well. Hilda Blades has been made aware of all recommendations. Nothing further needed at this time.            Time Spent with Patient: 45 (09/23/18 1300)

## 2018-09-23 NOTE — Progress Notes (Signed)
I connected with Alexander [name] on 09/23/18 at  2:30 PM EDT by video enabled telemedicine visit and verified that I am speaking with the correct person using two identifiers.   I discussed the limitations, risks, security and privacy concerns of performing an evaluation and management service by telemedicine and the availability of in-person appointments. I also discussed with the patient that there may be a patient responsible charge related to this service. The patient expressed understanding and agreed to proceed.  Other persons participating in the visit and their role in the encounter:  none  Patient's location:  home Provider's location:  Home  Diagnosis- lung adenocarcinoma Stage IIIB cT3 cN2 cM0  Chief Complaint:  Discuss ct scan results  History of present illness: patient is a 53 year old male with a long-standing history of smoking. He has smoked 1 to 1/2 pack of cigarettes per day for over 30 years. He presented to outside urgent care with some symptoms of shortness of breath and was treated for URI. He subsequently presented with facial and neck swelling which was thought by outside ER to be secondary to a drug reaction. Following that patient came to ER here at [facility].  He underwent CT chest abdomen and pelvis which showed 2.1 x 2 cm irregular mass in the right upper lobe concerning for malignancy.. Multiple collateral veins in chest and abdomen probable thrombosis of the left brachiocephalic vein. This is consistent with IVC syndrome Probable right paratracheal right hilar and precarinal adenopathy resulting in severe narrowing and thrombosis of SVC. This is consistent with SVC syndrome  Patient was seen by vascular surgery and there was no acute need for any endovascular therapies. He is on anticoagulation with Coumadin which has been subsequently changed to Eliquis as an outpatient. Patient initially underwent a bronchoscopy guided biopsy of the mediastinal lymph nodes  which was inconclusive and underwent a second IR guided biopsy of the right upper lobe lung mass which was consistent with adenocarcinoma  Patient started radiation treatment on 03/15/2018. Plan is to give 4 cycles of cisplatin and Alimta followed by maintenance durvalumab. Cycle 1 of cisplatin Alimta given on 03/16/2018.Patient completed 4 cycles of cisplatin and Alimta along with concurrent radiation on 05/20/2018. Scan showed partial response. Cycle 1 of durvalumab given on 06/17/2018. Patient presented to the ER with hypotension tachycardia and was found to have pericardial effusion 10 days after first dose of durvalumab. Fluid negative for malignancy  Patient was empirically treated for pericardial effusion probably secondary to immunotherapy versus radiation and completed 1 month of steroids.  Repeat echocardiogram was normal.  PDL 1 testing on his initial tumor specimen showed expression of less than 1%.   Interval history: Patient reports distended neck veins which have remained stable but he has noticed more distended veins over his abdomen.  Reports chronic fatigue but denies other complaints.  Appetite has been stable   Review of Systems  Constitutional: Positive for malaise/fatigue. Negative for chills, fever and weight loss.  HENT: Negative for congestion, ear discharge and nosebleeds.   Eyes: Negative for blurred vision.  Respiratory: Negative for cough, hemoptysis, sputum production, shortness of breath and wheezing.   Cardiovascular: Negative for chest pain, palpitations, orthopnea and claudication.       Distended veins over chest wall and abdomen  Gastrointestinal: Negative for abdominal pain, blood in stool, constipation, diarrhea, heartburn, melena, nausea and vomiting.  Genitourinary: Negative for dysuria, flank pain, frequency, hematuria and urgency.  Musculoskeletal: Negative for back pain, joint pain and myalgias.  Skin:  Negative for rash.  Neurological:  Negative for dizziness, tingling, focal weakness, seizures, weakness and headaches.  Endo/Heme/Allergies: Does not bruise/bleed easily.  Psychiatric/Behavioral: Negative for depression and suicidal ideas. The patient does not have insomnia.     Allergies  Allergen Reactions  . Doxycycline Swelling  . Penicillins Swelling    Did it involve swelling of the face/tongue/throat, SOB, or low BP? Unknown Did it involve sudden or severe rash/hives, skin peeling, or any reaction on the inside of your mouth or nose? Unknown Did you need to seek medical attention at a hospital or doctor's office? Unknown When did it last happen?Childhood If [name] above answers are "NO", may proceed with cephalosporin use.    Past Medical History:  Diagnosis Date  . Cancer ([place])   . COPD (chronic obstructive pulmonary disease) ([place])   . Lung mass     Past Surgical History:  Procedure Laterality Date  . ENDOBRONCHIAL ULTRASOUND N/A 03/11/2018   Procedure: ENDOBRONCHIAL ULTRASOUND;  Surgeon: Tyler Pita, MD;  Location: [facility];  Service: Cardiopulmonary;  Laterality: N/A;  . none    . PORTA CATH INSERTION N/A 03/12/2018   Procedure: PORTA CATH INSERTION, thigh;  Surgeon: Katha Cabal, MD;  Location: [facility];  Service: Cardiovascular;  Laterality: N/A;  . SUBXYPHOID PERICARDIAL WINDOW N/A 06/30/2018   Procedure: SUBXYPHOID PERICARDIAL WINDOW;  Surgeon: [name], MD;  Location: [facility];  Service: Open Heart Surgery;  Laterality: N/A;  . TEE WITHOUT CARDIOVERSION N/A 06/30/2018   Procedure: TRANSESOPHAGEAL ECHOCARDIOGRAM (TEE);  Surgeon: [name], MD;  Location: [facility];  Service: Open Heart Surgery;  Laterality: N/A;    Social History   Socioeconomic History  . Marital status: Divorced    Spouse name: Not on file  . Number of children: Not on file  . Years of education: Not on file  . Highest education level: Not on file  Occupational History  . Occupation: Programmer, multimedia  Social Needs  . Financial resource strain: Not on file  . Food insecurity:    Worry: Not on file    Inability: Not on file  . Transportation needs:    Medical: Not on file    Non-medical: Not on file  Tobacco Use  . Smoking status: Current Every Day Smoker    Packs/day: 0.25  . Smokeless tobacco: Never Used  Substance and Sexual Activity  . Alcohol use: Not Currently  . Drug use: Yes    Types: Marijuana  . Sexual activity: Not Currently  Lifestyle  . Physical activity:    Days per week: Not on file    Minutes per session: Not on file  . Stress: Not on file  Relationships  . Social connections:    Talks on phone: Not on file    Gets together: Not on file    Attends religious service: Not on file    Active member of club or organization: Not on file    Attends meetings of clubs or organizations: Not on file    Relationship status: Not on file  . Intimate partner violence:    Fear of current or ex partner: Not on file    Emotionally abused: Not on file    Physically abused: Not on file    Forced sexual activity: Not on file  Other Topics Concern  . Not on file  Social History Narrative  . Not on file    History reviewed. No pertinent family history.   Current Outpatient  Medications:  .  acetaminophen (TYLENOL) 500 MG tablet, Take 1 tablet (500 mg total) by mouth every 6 (six) hours as needed., Disp: , Rfl: 0 .  apixaban (ELIQUIS) 5 MG TABS tablet, Take 1 tablet (5 mg total) by mouth 2 (two) times daily., Disp: 60 tablet, Rfl: 3  Ct Soft Tissue Neck W Contrast  Result Date: 09/17/2018 CLINICAL DATA:  Lung cancer. Restaging. Current every day smoker. Pressure sensation in neck. Completion of radiation and chemotherapy. EXAM: CT NECK WITH CONTRAST TECHNIQUE: Multidetector CT imaging of the neck was performed using the standard protocol following the bolus administration of intravenous contrast. CONTRAST:  131mL OMNIPAQUE IOHEXOL 300 MG/ML  SOLN COMPARISON:  PET  scan 03/18/2018. FINDINGS: Pharynx and larynx: Asymmetric fullness versus coaptation LEFT vallecula. Difficult to exclude lingual tonsillar enlargement on the LEFT. Elsewhere asymmetric fullness LEFT oropharynx. If further investigation desired, consider direct inspection, or repeat PET scan. See image 55 series 17, and series 13, image 55. Normal larynx. Normal nasopharynx. Salivary glands: No inflammation, mass, or stone. Thyroid: Normal. Lymph nodes: None enlarged or abnormal density. Vascular: Negative. Limited intracranial: Negative. Visualized orbits: Negative. Mastoids and visualized paranasal sinuses: Clear. Skeleton: No acute or aggressive process. Upper chest: Reported separately. Other: None. IMPRESSION: 1. Asymmetric fullness of the LEFT oropharynx. Correlate clinically for tonsillitis or tonsillar mass. Asymmetry of the LEFT vallecula. Consider direct inspection. 2. No pathologic adenopathy. Electronically Signed   By: Staci Righter M.D.   On: 09/17/2018 16:32   Ct Chest W Contrast  Result Date: 09/17/2018 CLINICAL DATA:  53 year old male with history of right-sided lung cancer diagnosed last year. Follow-up study. EXAM: CT CHEST, ABDOMEN, AND PELVIS WITH CONTRAST TECHNIQUE: Multidetector CT imaging of the chest, abdomen and pelvis was performed following the standard protocol during bolus administration of intravenous contrast. CONTRAST:  160mL OMNIPAQUE IOHEXOL 300 MG/ML  SOLN COMPARISON:  CT the chest, abdomen and pelvis 05/31/2018. FINDINGS: CT CHEST FINDINGS Cardiovascular: Heart size is normal. Small amount of pericardial fluid and/or thickening, increased compared to the prior study. This is unlikely to be of hemodynamic significance at this time. No associated pericardial calcification. No atherosclerotic calcifications in the thoracic aorta or the coronary arteries. 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 Mediastinum/Nodes: Multiple prominent borderline enlarged and mildly enlarged right hilar and mediastinal lymph nodes, largest of which is a low right paratracheal lymph node measuring 1.7 cm in short axis (axial image 28 of series 4). No left hilar lymphadenopathy. Esophagus is unremarkable in appearance. No axillary lymphadenopathy. Lungs/Pleura: When compared to the prior examination there is a new mass-like area in the central aspect of the left lower lobe (axial image 106 of series 5 and coronal image 102 of series 6) measuring 2.7 x 2.8 x 3.5 cm, with macrolobulated slightly spiculated margins. In the distal aspect of the left lower lobe beyond this lesion there is a wedge-shaped density (axial image 117 of series 5), favored to reflect some postobstructive atelectasis/consolidation. There are few new patchy areas of peribronchovascular ground-glass attenuation in the right middle lobe, favored to be infectious or inflammatory in etiology. Previously treated right upper lobe nodule is similar to prior examinations measuring 2.0 x 1.4 cm (axial image 32 of series 5). Other areas of nodularity noted elsewhere in the lungs appear stable compared to prior examinations, most notably a 1.0 x 0.5 cm nodule in the right upper lobe near the  apex (axial image 37 of series 5). Diffuse bronchial wall thickening with mild centrilobular and paraseptal emphysema. No pleural effusions. Musculoskeletal: There are no aggressive appearing lytic or blastic lesions noted in the visualized portions of the skeleton. CT ABDOMEN PELVIS FINDINGS Hepatobiliary: No suspicious cystic or solid hepatic lesions. No intra or extrahepatic biliary ductal dilatation. Gallbladder is normal in appearance. Pancreas: No definite pancreatic mass or peripancreatic fluid or inflammatory changes. Spleen: Unremarkable. Adrenals/Urinary Tract: Bilateral kidneys and adrenal glands are normal in appearance. No  hydroureteronephrosis. Urinary bladder is normal in appearance. Stomach/Bowel: Normal appearance of the stomach. No pathologic dilatation of small bowel or colon. The appendix is not confidently identified and may be surgically absent. Regardless, there are no inflammatory changes noted adjacent to the cecum to suggest the presence of an acute appendicitis at this time. Vascular/Lymphatic: Aortic atherosclerosis, without evidence of aneurysm or dissection in the abdominal or pelvic vasculature. Numerous venous collaterals are noted throughout the anterior abdominal wall bilaterally (left greater than right). Right femoral central venous catheter with tip terminating in the intrahepatic portion of the inferior vena cava. No lymphadenopathy noted in the abdomen or pelvis. Reproductive: Prostate gland and seminal vesicles are unremarkable in appearance. Other: No significant volume of ascites.  No pneumoperitoneum. Musculoskeletal: Small sclerotic lesions in the sacrum bilaterally with narrow zones of transition, likely to represent bone islands, stable compared to prior studies measuring up to 8 mm in the left side of the sacrum. There are no aggressive appearing lytic or blastic lesions noted in the visualized portions of the skeleton. IMPRESSION: 1. New mass-like area in the central aspect of the left lower lobe with some associated postobstructive changes. Although this developed very rapidly, which is most characteristic of infection, the appearance of the main nidus of the lesion is highly concerning for neoplasm, either metastatic or a new primary bronchogenic neoplasm. Further clinical evaluation is recommended. 2. No signs of metastatic disease in the abdomen or pelvis. 3. New peribronchovascular ground-glass attenuation throughout the right middle lobe concerning for infectious or inflammatory process. 4. 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 5. Additional incidental findings, as above. Electronically Signed   By: Vinnie Langton M.D.   On: 09/17/2018 16:48   Ct Abdomen Pelvis W Contrast  Result Date: 09/17/2018 CLINICAL DATA:  53 year old male with history of right-sided lung cancer diagnosed last year. Follow-up study. EXAM: CT CHEST, ABDOMEN, AND PELVIS WITH CONTRAST TECHNIQUE: Multidetector CT imaging of the chest, abdomen and pelvis was performed following the standard protocol during bolus administration of intravenous contrast. CONTRAST:  170mL OMNIPAQUE IOHEXOL 300 MG/ML  SOLN COMPARISON:  CT the chest, abdomen and pelvis 05/31/2018. FINDINGS: CT CHEST FINDINGS Cardiovascular: Heart size is normal. Small amount of pericardial fluid and/or thickening, increased compared to the prior study. This is unlikely to be of hemodynamic significance at this time. No associated pericardial calcification. No atherosclerotic calcifications in the thoracic aorta or the coronary arteries. 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 Mediastinum/Nodes: Multiple prominent borderline enlarged and mildly enlarged right hilar and mediastinal lymph nodes, largest of which is a low right paratracheal lymph node measuring 1.7 cm in short axis (axial image 28 of series 4). No left hilar lymphadenopathy. Esophagus is unremarkable in appearance. No axillary lymphadenopathy. Lungs/Pleura: When compared to the prior examination there is a  new mass-like area in the central aspect of the left lower lobe (axial image 106 of series 5 and coronal image 102 of series 6) measuring 2.7 x 2.8 x 3.5 cm, with macrolobulated slightly spiculated margins. In the distal aspect of the left lower lobe beyond this lesion there is a wedge-shaped density (axial image 117 of series 5), favored to reflect some postobstructive  atelectasis/consolidation. There are few new patchy areas of peribronchovascular ground-glass attenuation in the right middle lobe, favored to be infectious or inflammatory in etiology. Previously treated right upper lobe nodule is similar to prior examinations measuring 2.0 x 1.4 cm (axial image 32 of series 5). Other areas of nodularity noted elsewhere in the lungs appear stable compared to prior examinations, most notably a 1.0 x 0.5 cm nodule in the right upper lobe near the apex (axial image 37 of series 5). Diffuse bronchial wall thickening with mild centrilobular and paraseptal emphysema. No pleural effusions. Musculoskeletal: There are no aggressive appearing lytic or blastic lesions noted in the visualized portions of the skeleton. CT ABDOMEN PELVIS FINDINGS Hepatobiliary: No suspicious cystic or solid hepatic lesions. No intra or extrahepatic biliary ductal dilatation. Gallbladder is normal in appearance. Pancreas: No definite pancreatic mass or peripancreatic fluid or inflammatory changes. Spleen: Unremarkable. Adrenals/Urinary Tract: Bilateral kidneys and adrenal glands are normal in appearance. No hydroureteronephrosis. Urinary bladder is normal in appearance. Stomach/Bowel: Normal appearance of the stomach. No pathologic dilatation of small bowel or colon. The appendix is not confidently identified and may be surgically absent. Regardless, there are no inflammatory changes noted adjacent to the cecum to suggest the presence of an acute appendicitis at this time. Vascular/Lymphatic: Aortic atherosclerosis, without evidence of aneurysm or dissection in the abdominal or pelvic vasculature. Numerous venous collaterals are noted throughout the anterior abdominal wall bilaterally (left greater than right). Right femoral central venous catheter with tip terminating in the intrahepatic portion of the inferior vena cava. No lymphadenopathy noted in the abdomen or pelvis. Reproductive: Prostate gland and seminal  vesicles are unremarkable in appearance. Other: No significant volume of ascites.  No pneumoperitoneum. Musculoskeletal: Small sclerotic lesions in the sacrum bilaterally with narrow zones of transition, likely to represent bone islands, stable compared to prior studies measuring up to 8 mm in the left side of the sacrum. There are no aggressive appearing lytic or blastic lesions noted in the visualized portions of the skeleton. IMPRESSION: 1. New mass-like area in the central aspect of the left lower lobe with some associated postobstructive changes. Although this developed very rapidly, which is most characteristic of infection, the appearance of the main nidus of the lesion is highly concerning for neoplasm, either metastatic or a new primary bronchogenic neoplasm. Further clinical evaluation is recommended. 2. No signs of metastatic disease in the abdomen or pelvis. 3. New peribronchovascular ground-glass attenuation throughout the right middle lobe concerning for infectious or inflammatory process. 4. 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 5. Additional incidental findings, as above. Electronically Signed   By: Vinnie Langton M.D.   On: 09/17/2018 16:48    No images are attached to the encounter.   CMP Latest Ref Rng & Units 08/06/2018  Glucose 70 - 99 mg/dL 83  BUN 6 - 20 mg/dL 22(H)  Creatinine 0.61 - 1.24 mg/dL 1.35(H)  Sodium 135 - 145 mmol/L 136  Potassium 3.5 - 5.1 mmol/L 4.6  Chloride 98 - 111 mmol/L 101  CO2 22 -  32 mmol/L 27  Calcium 8.9 - 10.3 mg/dL 8.9  Total Protein 6.5 - 8.1 g/dL 7.2  Total Bilirubin 0.3 - 1.2 mg/dL 0.6  Alkaline Phos 38 - 126 U/L 71  AST 15 - 41 U/L 16  ALT 0 - 44 U/L 19   CBC Latest Ref Rng & Units 08/06/2018  WBC 4.0 - 10.5 K/uL 7.7  Hemoglobin 13.0 - 17.0 g/dL 10.9(L)  Hematocrit 39.0 - 52.0 % 34.0(L)  Platelets 150 - 400 K/uL 158      Observation/objective: Patient appears in no acute distress over the video visit today.  Breathing is nonlabored  Assessment and plan: male adenocarcinoma of the right lung stage IIIB cT 3 N2 M0 presenting as SVC syndrome.  He completed concurrent chemoradiation with cisplatin and Alimta 4 cycles back in December 2019.  He was started on maintenance durvalumab and right after 1 cycle he developed significant pericardial effusion.  PDL 1 testing showed expression of less than 1% and therefore durvalumab was not continued.  This is a visit to discuss his CT scan results and further management  I have reviewed CT chest neck and abdomen pelvis images independently.  I discussed findings with the patient.  Right upper lobe nodule which the patient had previously appears stable but there is a masslike area of consolidation seen in the left lower lobe 2.7 x 2.8 x 3.5 cm some of it of which appears inflammatory but there is some concern for malignancy as well.  He still has enlarged right hilar and mediastinal lymph nodes which appear overall stable as compared to prior scans.  No other distant metastatic disease noted on CT scan.  He continues to have complete occlusion/severe stenosis of SVC.  I am concerned about disease progression given his left lower lobe lung findings.  I am leaning towards second line single agent chemotherapy with docetaxel given that his PDL 1 expression was less than 1%.  I will also discuss his case at tumor board this week and call the patient back after a consensus is obtained.  We were unable to obtain [facility] testing on his initial tumor specimen and I will double check with pathology if we can obtain it at this time or if he needs a repeat biopsy.  Suspect abdominal wall collaterals secondary to ongoing severe stenosis/occlusion of SVC which does not appear worse.  Continue Eliquis.  I will also touch base with vascular surgery to see if there would be any intervention  warranted at this time  Follow-up instructions: Follow-up to be decided after case discussion at tumor board  I discussed the assessment and treatment plan with the patient. The patient was provided an opportunity to ask questions and [name] were answered. The patient agreed with the plan and demonstrated an understanding of the instructions.   The patient was advised to call back or seek an in-person evaluation if the symptoms worsen or if the condition fails to improve as anticipated.  I provided 30 minutes of face-to-face video visit time during this encounter, and > 50% was spent counseling as documented under my assessment & plan.  Visit Diagnosis: 1. Malignant neoplasm of upper lobe of right lung ([place])   2. SVC (superior vena cava obstruction)     Dr. Randa Evens, MD, MPH [facility] [phone] 09/23/2018 8:23 AM

## 2018-09-23 NOTE — Progress Notes (Signed)
Tumor Board Documentation  Alexander [name] was presented by Dr Janese Banks at our Tumor Board on 09/23/2018, which included representatives from medical oncology, radiation oncology, surgical oncology, surgical, radiology, pathology, navigation, genetics, internal medicine, research, pulmonology.  Alexander [name] currently presents as a current patient, for discussion with history of the following treatments: active survellience, adjuvant radiation, adjuvant chemotherapy.  Additionally, we reviewed previous medical and familial history, history of present illness, and recent lab results along with all available histopathologic and imaging studies. The tumor board considered available treatment options and made the following recommendations: Biopsy PET Scan   The following procedures/referrals were also placed: No orders of the defined types were placed in this encounter.   Clinical Trial Status: not discussed   Staging used: Clinical Stage  AJCC Staging: T: cT3 N: cN2 M: M0 Group: Stage 3 B Lung cancer   National site-specific guidelines NCCN were discussed with respect to the case.  Tumor board is a meeting of clinicians from various specialty areas who evaluate and discuss patients for whom a multidisciplinary approach is being considered. Final determinations in the plan of care are those of the provider(s). The responsibility for follow up of recommendations given during tumor board is that of the provider.   Today's extended care, comprehensive team conference, [name] was not present for the discussion and was not examined.   Multidisciplinary Tumor Board is a multidisciplinary case peer review process.  Decisions discussed in the Multidisciplinary Tumor Board reflect the opinions of the specialists present at the conference without having examined the patient.  Ultimately, treatment and diagnostic decisions rest with the primary provider(s) and the patient.

## 2018-09-25 ENCOUNTER — Inpatient Hospital Stay
Admission: EM | Admit: 2018-09-25 | Discharge: 2018-09-27 | DRG: 871 | Disposition: A | Discharge status: Home / Self Care | Payer: Medicaid Other | Attending: Internal Medicine | Admitting: Internal Medicine

## 2018-09-25 ENCOUNTER — Emergency Department: Payer: Medicaid Other

## 2018-09-25 ENCOUNTER — Other Ambulatory Visit: Payer: Self-pay

## 2018-09-25 DIAGNOSIS — Z9221 Personal history of antineoplastic chemotherapy: Secondary | ICD-10-CM

## 2018-09-25 DIAGNOSIS — Z20828 Contact with and (suspected) exposure to other viral communicable diseases: Secondary | ICD-10-CM | POA: Diagnosis present

## 2018-09-25 DIAGNOSIS — A419 Sepsis, unspecified organism: Secondary | ICD-10-CM | POA: Diagnosis present

## 2018-09-25 DIAGNOSIS — J189 Pneumonia, unspecified organism: Secondary | ICD-10-CM | POA: Diagnosis present

## 2018-09-25 DIAGNOSIS — Z7901 Long term (current) use of anticoagulants: Secondary | ICD-10-CM | POA: Diagnosis not present

## 2018-09-25 DIAGNOSIS — F17210 Nicotine dependence, cigarettes, uncomplicated: Secondary | ICD-10-CM | POA: Diagnosis present

## 2018-09-25 DIAGNOSIS — Y95 Nosocomial condition: Secondary | ICD-10-CM | POA: Diagnosis present

## 2018-09-25 DIAGNOSIS — R079 Chest pain, unspecified: Secondary | ICD-10-CM | POA: Diagnosis present

## 2018-09-25 DIAGNOSIS — Z79891 Long term (current) use of opiate analgesic: Secondary | ICD-10-CM | POA: Diagnosis not present

## 2018-09-25 DIAGNOSIS — Z880 Allergy status to penicillin: Secondary | ICD-10-CM | POA: Diagnosis not present

## 2018-09-25 DIAGNOSIS — Z85118 Personal history of other malignant neoplasm of bronchus and lung: Secondary | ICD-10-CM

## 2018-09-25 DIAGNOSIS — J440 Chronic obstructive pulmonary disease with acute lower respiratory infection: Secondary | ICD-10-CM | POA: Diagnosis present

## 2018-09-25 DIAGNOSIS — J449 Chronic obstructive pulmonary disease, unspecified: Secondary | ICD-10-CM | POA: Diagnosis present

## 2018-09-25 DIAGNOSIS — Z923 Personal history of irradiation: Secondary | ICD-10-CM | POA: Diagnosis not present

## 2018-09-25 DIAGNOSIS — C3490 Malignant neoplasm of unspecified part of unspecified bronchus or lung: Secondary | ICD-10-CM | POA: Diagnosis present

## 2018-09-25 DIAGNOSIS — Z881 Allergy status to other antibiotic agents status: Secondary | ICD-10-CM | POA: Diagnosis not present

## 2018-09-25 LAB — CBC WITH DIFFERENTIAL/PLATELET
Abs Immature Granulocytes: 0.05 10*3/uL (ref 0.00–0.07)
Basophils Absolute: 0.1 10*3/uL (ref 0.0–0.1)
Basophils Relative: 0 %
Eosinophils Absolute: 0 10*3/uL (ref 0.0–0.5)
Eosinophils Relative: 0 %
HCT: 34.5 % — ABNORMAL LOW (ref 39.0–52.0)
Hemoglobin: 11 g/dL — ABNORMAL LOW (ref 13.0–17.0)
Immature Granulocytes: 0 %
Lymphocytes Relative: 7 %
Lymphs Abs: 1 10*3/uL (ref 0.7–4.0)
MCH: 28.8 pg (ref 26.0–34.0)
MCHC: 31.9 g/dL (ref 30.0–36.0)
MCV: 90.3 fL (ref 80.0–100.0)
Monocytes Absolute: 1.3 10*3/uL — ABNORMAL HIGH (ref 0.1–1.0)
Monocytes Relative: 9 %
Neutro Abs: 11.7 10*3/uL — ABNORMAL HIGH (ref 1.7–7.7)
Neutrophils Relative %: 84 %
Platelets: 239 10*3/uL (ref 150–400)
RBC: 3.82 MIL/uL — ABNORMAL LOW (ref 4.22–5.81)
RDW: 14 % (ref 11.5–15.5)
WBC: 14.1 10*3/uL — ABNORMAL HIGH (ref 4.0–10.5)
nRBC: 0 % (ref 0.0–0.2)

## 2018-09-25 LAB — COMPREHENSIVE METABOLIC PANEL
ALT: 8 U/L (ref 0–44)
AST: 15 U/L (ref 15–41)
Albumin: 3.4 g/dL — ABNORMAL LOW (ref 3.5–5.0)
Alkaline Phosphatase: 84 U/L (ref 38–126)
Anion gap: 12 (ref 5–15)
BUN: 14 mg/dL (ref 6–20)
CO2: 29 mmol/L (ref 22–32)
Calcium: 8.9 mg/dL (ref 8.9–10.3)
Chloride: 94 mmol/L — ABNORMAL LOW (ref 98–111)
Creatinine, Ser: 1.26 mg/dL — ABNORMAL HIGH (ref 0.61–1.24)
GFR calc Af Amer: >60 mL/min (ref >60)
GFR calc non Af Amer: >60 mL/min (ref >60)
Glucose, Bld: 129 mg/dL — ABNORMAL HIGH (ref 70–99)
Potassium: 4.1 mmol/L (ref 3.5–5.1)
Sodium: 135 mmol/L (ref 135–145)
Total Bilirubin: 0.5 mg/dL (ref 0.3–1.2)
Total Protein: 7.2 g/dL (ref 6.5–8.1)

## 2018-09-25 LAB — LACTIC ACID, PLASMA: Lactic Acid, Venous: 1.1 mmol/L (ref 0.5–1.9)

## 2018-09-25 LAB — TROPONIN I: Troponin I: <0.03 ng/mL (ref <0.03)

## 2018-09-25 LAB — SARS CORONAVIRUS 2 BY RT PCR (HOSPITAL ORDER, PERFORMED IN ~~LOC~~ HOSPITAL LAB): SARS Coronavirus 2: NEGATIVE

## 2018-09-25 MED ORDER — VANCOMYCIN HCL IN DEXTROSE 1-5 GM/200ML-% IV SOLN
1000.0000 mg | Freq: Once | INTRAVENOUS | Status: AC
Start: 2018-09-25 — End: 2018-09-25
  Administered 2018-09-25: 1000 mg via INTRAVENOUS
  Filled 2018-09-25: qty 200

## 2018-09-25 MED ORDER — OXYCODONE HCL 5 MG PO TABS
5.0000 mg | ORAL_TABLET | ORAL | Status: DC | PRN
  Administered 2018-09-26 – 2018-09-27 (×2): 5 mg via ORAL
  Filled 2018-09-25 (×2): qty 1

## 2018-09-25 MED ORDER — ONDANSETRON HCL 4 MG PO TABS: 4.0000 mg | ORAL_TABLET | Freq: Four times a day (QID) | ORAL | Status: DC | PRN

## 2018-09-25 MED ORDER — BENZONATATE 100 MG PO CAPS
200.0000 mg | ORAL_CAPSULE | Freq: Three times a day (TID) | ORAL | Status: DC | PRN
  Administered 2018-09-26: 20:00:00 200 mg via ORAL
  Filled 2018-09-25: qty 2

## 2018-09-25 MED ORDER — ACETAMINOPHEN 325 MG PO TABS
650.0000 mg | ORAL_TABLET | Freq: Four times a day (QID) | ORAL | Status: DC | PRN
  Administered 2018-09-26 (×2): 650 mg via ORAL
  Filled 2018-09-25 (×2): qty 2

## 2018-09-25 MED ORDER — GUAIFENESIN-DM 100-10 MG/5ML PO SYRP: 5.0000 mL | ORAL_SOLUTION | ORAL | Status: DC | PRN

## 2018-09-25 MED ORDER — SODIUM CHLORIDE 0.9 % IV SOLN
1.0000 g | Freq: Once | INTRAVENOUS | Status: AC
  Administered 2018-09-25: 1 g via INTRAVENOUS
  Filled 2018-09-25: qty 1

## 2018-09-25 MED ORDER — SODIUM CHLORIDE 0.9 % IV BOLUS
1000.0000 mL | Freq: Once | INTRAVENOUS | Status: AC
  Administered 2018-09-25: 1000 mL via INTRAVENOUS

## 2018-09-25 MED ORDER — ONDANSETRON HCL 4 MG/2ML IJ SOLN: 4.0000 mg | Freq: Four times a day (QID) | INTRAMUSCULAR | Status: DC | PRN

## 2018-09-25 MED ORDER — IOHEXOL 350 MG/ML SOLN
75.0000 mL | Freq: Once | INTRAVENOUS | Status: AC | PRN
  Administered 2018-09-25: 75 mL via INTRAVENOUS
  Filled 2018-09-25: qty 75

## 2018-09-25 MED ORDER — APIXABAN 5 MG PO TABS
5.0000 mg | ORAL_TABLET | Freq: Two times a day (BID) | ORAL | Status: DC
  Administered 2018-09-26 – 2018-09-27 (×3): 5 mg via ORAL
  Filled 2018-09-25 (×3): qty 1

## 2018-09-25 MED ORDER — IPRATROPIUM-ALBUTEROL 0.5-2.5 (3) MG/3ML IN SOLN: 3.0000 mL | RESPIRATORY_TRACT | Status: DC | PRN

## 2018-09-25 MED ORDER — ACETAMINOPHEN 650 MG RE SUPP: 650.0000 mg | Freq: Four times a day (QID) | RECTAL | Status: DC | PRN

## 2018-09-25 NOTE — ED Triage Notes (Addendum)
Pt arrives to ED POV for L sided CP and fever. States feels SOB d/t wearing mask. Hx stage 3 lung CA. No chemo/radiation at this time. Doesn't wear oxygen. Symptoms began yesterday. Pt states he took a Ambulance person before coming.

## 2018-09-25 NOTE — H&P (Signed)
[facility] [place] NAME: Alexander [name]    MR#:  [id number]  DATE OF BIRTH:  1965-07-15  DATE OF ADMISSION:  09/25/2018  PRIMARY CARE PHYSICIAN: Patient, No Pcp Per   REQUESTING/REFERRING PHYSICIAN: Alfred Levins, MD  CHIEF COMPLAINT:   Chief Complaint  Patient presents with  . Fever  . Chest Pain    HISTORY OF PRESENT ILLNESS:  Alexander [name]  is a 53 y.o. male who presents with chief complaint as above.  Patient presents to the ED with 2 days of increasing cough, fever at home.  His cough is productive of sputum.  He has a history of lung cancer which was treated in the past with chemotherapy and radiation.  He is not currently on any active chemo or radiation therapy.  Here in the ED his work-up is consistent with likely pneumonia.  He does meet sepsis criteria.  Hospitalist were called for admission  PAST MEDICAL HISTORY:   Past Medical History:  Diagnosis Date  . Cancer ([place])   . COPD (chronic obstructive pulmonary disease) ([place])   . Lung mass      PAST SURGICAL HISTORY:   Past Surgical History:  Procedure Laterality Date  . ENDOBRONCHIAL ULTRASOUND N/A 03/11/2018   Procedure: ENDOBRONCHIAL ULTRASOUND;  Surgeon: Tyler Pita, MD;  Location: [facility];  Service: Cardiopulmonary;  Laterality: N/A;  . none    . PORTA CATH INSERTION N/A 03/12/2018   Procedure: PORTA CATH INSERTION, thigh;  Surgeon: Katha Cabal, MD;  Location: [facility];  Service: Cardiovascular;  Laterality: N/A;  . SUBXYPHOID PERICARDIAL WINDOW N/A 06/30/2018   Procedure: SUBXYPHOID PERICARDIAL WINDOW;  Surgeon: [name], MD;  Location: [facility];  Service: Open Heart Surgery;  Laterality: N/A;  . TEE WITHOUT CARDIOVERSION N/A 06/30/2018   Procedure: TRANSESOPHAGEAL ECHOCARDIOGRAM (TEE);  Surgeon: [name], MD;  Location: [facility];  Service: Open Heart Surgery;  Laterality: N/A;     SOCIAL HISTORY:   Social History    Tobacco Use  . Smoking status: Current Every Day Smoker    Packs/day: 0.25  . Smokeless tobacco: Never Used  Substance Use Topics  . Alcohol use: Not Currently     FAMILY HISTORY:    Family history reviewed and is non-contributory DRUG ALLERGIES:   Allergies  Allergen Reactions  . Doxycycline Swelling  . Penicillins Swelling    Did it involve swelling of the face/tongue/throat, SOB, or low BP? Unknown Did it involve sudden or severe rash/hives, skin peeling, or any reaction on the inside of your mouth or nose? Unknown Did you need to seek medical attention at a hospital or doctor's office? Unknown When did it last happen?Childhood If all above answers are "NO", may proceed with cephalosporin use.    MEDICATIONS AT HOME:   Prior to Admission medications   Medication Sig Start Date End Date Taking? Authorizing Provider  apixaban (ELIQUIS) 5 MG TABS tablet Take 1 tablet (5 mg total) by mouth 2 (two) times daily. 04/08/18 09/25/18 Yes Sindy Guadeloupe, MD  oxyCODONE-acetaminophen (PERCOCET/ROXICET) 5-325 MG tablet Take 1 tablet by mouth every 6 (six) hours as needed for severe pain.    Yes [provider]    REVIEW OF SYSTEMS:  Review of Systems  Constitutional: Positive for fever. Negative for chills, malaise/fatigue and weight loss.  HENT: Negative for ear pain, hearing loss and tinnitus.   Eyes: Negative for blurred vision, double vision, pain and redness.  Respiratory:  Positive for cough and sputum production. Negative for hemoptysis and shortness of breath.   Cardiovascular: Negative for chest pain, palpitations, orthopnea and leg swelling.  Gastrointestinal: Negative for abdominal pain, constipation, diarrhea, nausea and vomiting.  Genitourinary: Negative for dysuria, frequency and hematuria.  Musculoskeletal: Negative for back pain, joint pain and neck pain.  Skin:       No acne, rash, or lesions  Neurological: Negative for dizziness, tremors, focal  weakness and weakness.  Endo/Heme/Allergies: Negative for polydipsia. Does not bruise/bleed easily.  Psychiatric/Behavioral: Negative for depression. The patient is not nervous/anxious and does not have insomnia.      VITAL SIGNS:   Vitals:   09/25/18 1808 09/25/18 1810 09/25/18 1845 09/25/18 2058  BP: (!) 146/126  118/79 122/84  Pulse: (!) 136  (!) 121 (!) 121  Resp: (!) 24  (!) 23 (!) 22  Temp: 98.3 F (36.8 C)     TempSrc: Oral     SpO2: 94%  95% 95%  Weight:  57.2 kg    Height:  5\' 9"  (1.753 m)     Wt Readings from Last 3 Encounters:  09/25/18 57.2 kg  08/06/18 62.4 kg  07/22/18 62.9 kg    PHYSICAL EXAMINATION:  Physical Exam  Vitals reviewed. Constitutional: He is oriented to person, place, and time. He appears well-developed and well-nourished. No distress.  HENT:  Head: Normocephalic and atraumatic.  Mouth/Throat: Oropharynx is clear and moist.  Eyes: Pupils are equal, round, and reactive to light. Conjunctivae and EOM are normal. No scleral icterus.  Neck: Normal range of motion. Neck supple. No JVD present. No thyromegaly present.  Cardiovascular: Normal rate, regular rhythm and intact distal pulses. Exam reveals no gallop and no friction rub.  No murmur heard. Respiratory: Effort normal. No respiratory distress. He has no wheezes. He has no rales.  Coarse breath sounds  GI: Soft. Bowel sounds are normal. He exhibits no distension. There is no abdominal tenderness.  Musculoskeletal: Normal range of motion.        General: No edema.     Comments: No arthritis, no gout  Lymphadenopathy:    He has no cervical adenopathy.  Neurological: He is alert and oriented to person, place, and time. No cranial nerve deficit.  No dysarthria, no aphasia  Skin: Skin is warm and dry. No rash noted. No erythema.  Psychiatric: He has a normal mood and affect. His behavior is normal. Judgment and thought content normal.    LABORATORY PANEL:   CBC Recent Labs  Lab  09/25/18 1850  WBC 14.1*  HGB 11.0*  HCT 34.5*  PLT 239   ------------------------------------------------------------------------------------------------------------------  Chemistries  Recent Labs  Lab 09/25/18 1850  NA 135  K 4.1  CL 94*  CO2 29  GLUCOSE 129*  BUN 14  CREATININE 1.26*  CALCIUM 8.9  AST 15  ALT 8  ALKPHOS 84  BILITOT 0.5   ------------------------------------------------------------------------------------------------------------------  Cardiac Enzymes Recent Labs  Lab 09/25/18 1850  TROPONINI <0.03   ------------------------------------------------------------------------------------------------------------------  RADIOLOGY:  Ct Angio Chest Pe W And/or Wo Contrast  Result Date: 09/25/2018 CLINICAL DATA:  History of stage III lung cancer. No chemo or radiation at this time. Left-sided chest pain and fever starting yesterday. EXAM: CT ANGIOGRAPHY CHEST WITH CONTRAST TECHNIQUE: Multidetector CT imaging of the chest was performed using the standard protocol during bolus administration of intravenous contrast. Multiplanar CT image reconstructions and MIPs were obtained to evaluate the vascular anatomy. CONTRAST:  42mL OMNIPAQUE IOHEXOL 350 MG/ML SOLN COMPARISON:  Chest  x-ray Sep 25, 2018.  Chest CT September 17, 2018. FINDINGS: Cardiovascular: The heart is unchanged. Mild atherosclerotic changes are seen in the thoracic aorta without aneurysm or dissection. The known narrowing of the left brachiocephalic vein and severe narrowing versus occlusion of the superior vena cava are not well appreciated on today's study at it has there is no opacification on the left, possibly due to timing of contrast. The collaterals in the chest wall remain. No acute pulmonary emboli. Mediastinum/Nodes: The thyroid and esophagus are unremarkable. A right hilar node on series 4, image 45 measures 15 mm today versus 11 mm previously. The low right paratracheal node on series 4, image 38  measures 17 mm on both studies in short axis. A prevascular node on series 4, image 39 measures 10 mm in short axis today versus 6 mm June 29, 2018. No left hilar adenopathy. No other change in the mediastinal nodes. A small pericardial effusion is similar to mildly more prominent the interval. The effusion measures 12 mm in thickness on series 4, image 83 today versus 6 mm in thickness previously. There is a small left pleural effusion which is new. Lungs/Pleura: The trachea and mainstem bronchi are patent. Left-sided central airways are normal. There is wall thickening associated with a right upper lobe branch as seen on axial image 39. There is soft tissue fullness in the right suprahilar region which is stable. There is short segment occlusion of a posterior right upper lobe branch of the bronchus. This is also stable. Narrowing of the right middle lobe bronchus as it arises from the bronchus intermedius is seen on series 6, image 54. Narrowing of a right lower lobe bronchus as it arises from the bronchus intermedius on image 56 is identified. There is associated thickening of the right bronchus intermedius. No pneumothorax. Emphysematous changes seen in the lungs. The right apically nodule on series 6, image 18 measures 10 by 5 mm today, unchanged. The site of the patient's primary malignancy in the medial right upper lobe measures 19 x 12 mm today, not significantly changed measuring 20 x 14 mm previously. The spiculated masslike opacity in the left lower lobe measures 2.7 by 2.5 cm today versus 2.8 x 2.7 cm previously, similar in the interval. This spiculated masslike opacity abuts a left lower lobe pulmonary artery. Just distal to this nodular density is a triangular region of opacification in the posterior left lower lobe, likely postobstructive change. Several tiny scattered nodules in the lungs are otherwise stable. No new nodules, masses, or infiltrates. Upper Abdomen: No acute abnormality.  Musculoskeletal: No chest wall abnormality. No acute or significant osseous findings. Review of the MIP images confirms the above findings. IMPRESSION: 1. No pulmonary emboli identified. 2. The right apical nodule on series 6, image 18 is stable. The site of the known malignancy in the medial right upper lobe is not significantly changed in the interval. 3. The spiculated masslike opacity in the left lower lobe is similar in the interval with associated postobstructive changes. This is not changed since September 17, 2018. The lesion is highly concerning for neoplasm, either metastatic or a primary bronchogenic neoplasm. A focal pneumonia is possible but less likely given the stability. 4. Soft tissue fullness in the right suprahilar region is stable. Bronchial wall thickening and narrowing of some upper lobe bronchial branches is stable. 5. There is increased thickening of the bronchus intermedius and branching vessels with some narrowing of right middle and lower lobe bronchi. This is a new finding.  6. New left pleural effusion. The pericardial effusion is a little larger in the interval. 7. A prevascular node and right hilar node are little larger in the interval. Other nodes are similar in the interval. 8. 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 This is not as well assessed today due to timing of contrast and neither of these veins opacifies on today's study. Aortic Atherosclerosis (ICD10-I70.0) and Emphysema (ICD10-J43.9). Electronically Signed   By: Dorise Bullion III M.D   On: 09/25/2018 20:31   Dg Chest Portable 1 View  Result Date: 09/25/2018 CLINICAL DATA:  LEFT side chest pain and fever EXAM: PORTABLE CHEST 1 VIEW COMPARISON:  Portable exam 1830 hours compared to 07/22/2018 Correlation: CT chest 09/17/2018 FINDINGS: Normal heart size, mediastinal contours, and pulmonary vascularity. Lungs emphysematous with probable BILATERAL nipple shadows. No acute  infiltrate, pleural effusion or pneumothorax. Bones unremarkable. IMPRESSION: Emphysematous changes with probable BILATERAL nipple shadows; no definite pulmonary nodules at these sites on recent CT. No acute infiltrate. Electronically Signed   By: Lavonia Dana M.D.   On: 09/25/2018 18:56    EKG:   Orders placed or performed during the hospital encounter of 09/25/18  . ED EKG  . ED EKG    IMPRESSION AND PLAN:  Principal Problem:   Sepsis ([place]) -IV antibiotics initiated, lactic acid was within normal limits, blood pressure stable, culture sent from the ED, sepsis is due to pneumonia as below Active Problems:   HCAP (healthcare-associated pneumonia) -IV antibiotics as above, other supportive treatment as needed   COPD (chronic obstructive pulmonary disease) ([place]) -home dose inhalers   Lung cancer ([place]) -not currently receiving treatment for this.  CT scan shows some lung lesions concerning for possible malignancy, but that are stable since prior exam.  Patient will need continued follow-up with oncology  Chart review performed and case discussed with ED provider. Labs, imaging and/or ECG reviewed by provider and discussed with patient/family. Management plans discussed with the patient and/or family.  DVT PROPHYLAXIS: Systemic anticoagulation  GI PROPHYLAXIS:  None  ADMISSION STATUS: Inpatient     CODE STATUS: Full Code Status History    Date Active Date Inactive Code Status Order ID Comments User Context   06/30/2018 0421 07/03/2018 1452 Partial Code [id number]  Arnell Asal, NP Inpatient   06/30/2018 0236 06/30/2018 0421 Full Code [id number]  Frederik Pear, MD Inpatient   03/09/2018 1812 03/13/2018 1920 Full Code [id number]  Saundra Shelling, MD Inpatient    Questions for Most Recent Historical Code Status (Order [id number])    Question Answer Comment   In the event of cardiac or respiratory ARREST: Initiate Code Blue, Call Rapid Response No    In the event of cardiac or respiratory  ARREST: Perform CPR No    In the event of cardiac or respiratory ARREST: Perform Intubation/Mechanical Ventilation No    In the event of cardiac or respiratory ARREST: Use NIPPV/BiPAp only if indicated Yes    In the event of cardiac or respiratory ARREST: Administer ACLS medications if indicated Yes    In the event of cardiac or respiratory ARREST: Perform Defibrillation or Cardioversion if indicated No         Advance Directive Documentation     Most Recent Value  Type of Advance Directive  Living will  Pre-existing out of facility DNR order (yellow form or pink MOST form)  -  "MOST" Form in Place?  -      TOTAL  TIME TAKING CARE OF THIS PATIENT: 45 minutes.   Ethlyn Daniels 09/25/2018, 10:12 PM  [facility]  Office  [phone]  CC: Primary care physician; Patient, No Pcp Per  Note:  This document was prepared using Dragon voice recognition software and may include unintentional dictation errors.

## 2018-09-25 NOTE — Progress Notes (Signed)
CODE SEPSIS - PHARMACY COMMUNICATION  **Broad Spectrum Antibiotics should be administered within 1 hour of Sepsis diagnosis**  Time Code Sepsis Called/Page Received: 2057   Antibiotics Ordered: Vancomycin and Cefepime   Time of 1st antibiotic administration: Cefepime @ 2122  Additional action taken by pharmacy: N/A    Rowland Lathe ,PharmD Clinical Pharmacist  09/25/2018  9:27 PM

## 2018-09-25 NOTE — ED Provider Notes (Signed)
[facility] Emergency Department Provider Note ____________________________________________  Time seen: 1955  I have reviewed the triage vital signs and the nursing notes.  HISTORY  Chief Complaint  Fever and Chest Pain  HPI Alexander [name] is a 53 y.o. male presents to the ED at the advice of Dr. Mike Gip, for evaluation of left-sided chest pain and fevers.  Patient describes current shortness of breath secondary to wearing a mask.  He is a stage III lung cancer patient with no current chemo or radiation therapy.  He reports onset of symptoms yesterday.  Today he began to experience some fevers, hypotension, and shortness of breath.  He did take a Percocet before reporting to the ED.  Past Medical History:  Diagnosis Date  . Cancer ([place])   . COPD (chronic obstructive pulmonary disease) ([place])   . Lung mass     Patient Active Problem List   Diagnosis Date Noted  . Sepsis ([place]) 09/25/2018  . HCAP (healthcare-associated pneumonia) 09/25/2018  . COPD (chronic obstructive pulmonary disease) ([place]) 09/25/2018  . Malnutrition of moderate degree 07/03/2018  . Acute respiratory failure ([place])   . Pericardial effusion with cardiac tamponade 06/30/2018  . Adenocarcinoma, lung, right ([place])   . History of cancer chemotherapy   . History of radiation therapy   . Elevated serum creatinine 06/10/2018  . Goals of care, counseling/discussion 06/03/2018  . Lung cancer ([place]) 03/16/2018  . 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 ENDOBRONCHIAL ULTRASOUND N/A 03/11/2018   Procedure: ENDOBRONCHIAL ULTRASOUND;  Surgeon: Tyler Pita, MD;  Location: [facility] ORS;  Service: Cardiopulmonary;  Laterality: N/A;  . none    . PORTA CATH INSERTION N/A 03/12/2018   Procedure: PORTA CATH INSERTION, thigh;  Surgeon: Katha Cabal, MD;  Location: [place] [facility];  Service: Cardiovascular;  Laterality: N/A;  . SUBXYPHOID PERICARDIAL WINDOW  N/A 06/30/2018   Procedure: SUBXYPHOID PERICARDIAL WINDOW;  Surgeon: [name], MD;  Location: [facility];  Service: Open Heart Surgery;  Laterality: N/A;  . TEE WITHOUT CARDIOVERSION N/A 06/30/2018   Procedure: TRANSESOPHAGEAL ECHOCARDIOGRAM (TEE);  Surgeon: [name], MD;  Location: [facility];  Service: Open Heart Surgery;  Laterality: N/A;    Prior to Admission medications   Medication Sig Start Date End Date Taking? Authorizing Provider  acetaminophen (TYLENOL) 500 MG tablet Take 1 tablet (500 mg total) by mouth every 6 (six) hours as needed. 07/03/18   Domenic Polite, MD  apixaban (ELIQUIS) 5 MG TABS tablet Take 1 tablet (5 mg total) by mouth 2 (two) times daily. 04/08/18 08/06/18  Sindy Guadeloupe, MD    Allergies Doxycycline and Penicillins  History reviewed. No pertinent family history.  Social History Social History   Tobacco Use  . Smoking status: Current Every Day Smoker    Packs/day: 0.25  . Smokeless tobacco: Never Used  Substance Use Topics  . Alcohol use: Not Currently  . Drug use: Yes    Types: Marijuana    Review of Systems  Constitutional: Positive for fever. Eyes: Negative for visual changes. ENT: Negative for sore throat. Cardiovascular: Positive for chest pain. Respiratory: Positive for shortness of breath. Gastrointestinal: Negative for abdominal pain, vomiting and diarrhea. Genitourinary: Negative for dysuria. Musculoskeletal: Negative for back pain. Skin: Negative for rash. Neurological: Negative for headaches, focal weakness or numbness. ____________________________________________  PHYSICAL EXAM:  VITAL SIGNS: ED Triage Vitals  Enc Vitals Group     BP 09/25/18 1808 (!) 146/126  Pulse Rate 09/25/18 1808 (!) 136     Resp 09/25/18 1808 (!) 24     Temp 09/25/18 1808 98.3 F (36.8 C)     Temp Source 09/25/18 1808 Oral     SpO2 09/25/18 1808 94 %     Weight 09/25/18 1810 126 lb (57.2 kg)     Height 09/25/18 1810 5\' 9"  (1.753 m)      Head Circumference --      Peak Flow --      Pain Score 09/25/18 1809 3     Pain Loc --      Pain Edu? --      Excl. in [place]? --     Constitutional: Alert and oriented. Ill-appearing, cachetic and in no acute distress. Head: Normocephalic and atraumatic. Eyes: Conjunctivae are normal. Normal extraocular movements Cardiovascular: Normal rate, regular rhythm. Normal distal pulses. Respiratory: Normal respiratory effort. No wheezes/rales/rhonchi. Gastrointestinal: Soft, flat, and nontender. No distention. Musculoskeletal: Nontender with normal range of motion in all extremities.  Neurologic:  Normal gait without ataxia. Normal speech and language. No gross focal neurologic deficits are appreciated. Skin:  Skin is warm, dry and intact. No rash noted. ____________________________________________   LABS (pertinent positives/negatives) Labs Reviewed  CBC WITH DIFFERENTIAL/PLATELET - Abnormal; Notable for the following components:      Result Value   WBC 14.1 (*)    RBC 3.82 (*)    Hemoglobin 11.0 (*)    HCT 34.5 (*)    Neutro Abs 11.7 (*)    Monocytes Absolute 1.3 (*)    All other components within normal limits  COMPREHENSIVE METABOLIC PANEL - Abnormal; Notable for the following components:   Chloride 94 (*)    Glucose, Bld 129 (*)    Creatinine, Ser 1.26 (*)    Albumin 3.4 (*)    All other components within normal limits  SARS CORONAVIRUS 2 (HOSPITAL ORDER, [place] [facility])  CULTURE, BLOOD (ROUTINE X 2)  CULTURE, BLOOD (ROUTINE X 2)  LACTIC ACID, PLASMA  TROPONIN I  URINALYSIS, ROUTINE W REFLEX MICROSCOPIC  ___________________________________________  EKG See EKG report ____________________________________________   RADIOLOGY  CXR  IMPRESSION: Emphysematous changes with probable BILATERAL nipple shadows; no definite pulmonary nodules at these sites on recent CT.  No acute infiltrate.   CT Chest PE w/ w/o CM  5. There is increased  thickening of the bronchus intermedius and branching vessels with some narrowing of right middle and lower lobe bronchi. This is a new finding. 6. New left pleural effusion. The pericardial effusion is a little larger in the interval. ____________________________________________  PROCEDURES  Procedures Vancomycin 1000 mg/200mg  IVPB Cefepime (Maxipime) 1 g IVPB ____________________________________________  INITIAL IMPRESSION / ASSESSMENT AND PLAN / ED COURSE  Edrei Norgaard was evaluated in Emergency Department on 09/25/2018 for the symptoms described in the history of present illness. He was evaluated in the context of the global COVID-19 pandemic, which necessitated consideration that the patient might be at risk for infection with the SARS-CoV-2 virus that causes COVID-19. Institutional protocols and algorithms that pertain to the evaluation of patients at risk for COVID-19 are in a state of rapid change based on information released by regulatory bodies including the CDC and federal and state organizations. These policies and algorithms were followed during the patient's care in the ED.  Patient with known lung neoplasm with sudden onset SOB, hypotension, and fevers. He presents for evaluation due to his recent pericardial window. His exam, labs, and imaging are concerning for  a superimposed pneumonia. Dr. Alfred Levins, in communication with Dr. Mike Gip, will admit to patient to the hospitalist service for IV Abx therapy. Patient is reluctantly in agreement.  ____________________________________________  FINAL CLINICAL IMPRESSION(S) / ED DIAGNOSES  Final diagnoses:  Sepsis without acute organ dysfunction, due to unspecified organism [facility])  HCAP (healthcare-associated pneumonia)      Melvenia Needles, PA-C 09/25/18 2123    Rudene Re, MD 09/25/18 2239

## 2018-09-25 NOTE — ED Notes (Signed)
ED TO INPATIENT HANDOFF REPORT  ED Nurse Name and Phone #: Lenni Reckner [phone]  S Name/Age/Gender Alexander [name] 53 y.o. male Room/Bed: ED36A/ED36A  Code Status   Code Status: Prior  Home/SNF/Other Home Patient oriented to: a&ox3 Is this baseline? Yes   Triage Complete: Triage complete  Chief Complaint fever  Triage Note Pt arrives to ED POV for L sided CP and fever. States feels SOB d/t wearing mask. Hx stage 3 lung CA. No chemo/radiation at this time. Doesn't wear oxygen. Symptoms began yesterday. Pt states he took a Ambulance person before coming.    Allergies Allergies  Allergen Reactions  . Doxycycline Swelling  . Penicillins Swelling    Did it involve swelling of the face/tongue/throat, SOB, or low BP? Unknown Did it involve sudden or severe rash/hives, skin peeling, or any reaction on the inside of your mouth or nose? Unknown Did you need to seek medical attention at a hospital or doctor's office? Unknown When did it last happen?Childhood If [name] above answers are "NO", may proceed with cephalosporin use.    Level of Care/Admitting Diagnosis ED Disposition    ED Disposition Condition [facility] Area: [place] [[id number]]  Level of Care: Med-Surg [16]  Covid Evaluation: N/A  Diagnosis: Sepsis [facility]) [[id number]]  Admitting Physician: Lance Coon [[id number]]  Attending Physician: Lance Coon [phone]  Estimated length of stay: past midnight tomorrow  Certification:: I certify this patient will need inpatient services for at least 2 midnights  PT Class (Do Not Modify): Inpatient [101]  PT Acc Code (Do Not Modify): Private [1]       B Medical/Surgery History Past Medical History:  Diagnosis Date  . Cancer ([place])   . COPD (chronic obstructive pulmonary disease) ([place])   . Lung mass    Past Surgical History:  Procedure Laterality Date  . ENDOBRONCHIAL ULTRASOUND N/A 03/11/2018   Procedure: ENDOBRONCHIAL ULTRASOUND;  Surgeon:  Tyler Pita, MD;  Location: [facility] ORS;  Service: Cardiopulmonary;  Laterality: N/A;  . none    . PORTA CATH INSERTION N/A 03/12/2018   Procedure: PORTA CATH INSERTION, thigh;  Surgeon: Katha Cabal, MD;  Location: [place] [facility];  Service: Cardiovascular;  Laterality: N/A;  . SUBXYPHOID PERICARDIAL WINDOW N/A 06/30/2018   Procedure: SUBXYPHOID PERICARDIAL WINDOW;  Surgeon: [name], MD;  Location: [facility];  Service: Open Heart Surgery;  Laterality: N/A;  . TEE WITHOUT CARDIOVERSION N/A 06/30/2018   Procedure: TRANSESOPHAGEAL ECHOCARDIOGRAM (TEE);  Surgeon: [name], MD;  Location: [facility];  Service: Open Heart Surgery;  Laterality: N/A;     A IV Location/Drains/Wounds Patient Lines/Drains/Airways Status   Active Line/Drains/Airways    Name:   Placement date:   Placement time:   Site:   Days:   Implanted Port Right Thigh   -    -    Thigh      Peripheral IV 09/25/18 Right Antecubital   09/25/18    1852    Antecubital   less than 1   Incision (Closed) 06/30/18 Chest Other (Comment)   06/30/18    1437     87          Intake/Output Last 24 hours No intake or output data in the 24 hours ending 09/25/18 2227  Labs/Imaging Results for orders placed or performed during the hospital encounter of 09/25/18 (from the past 48 hour(s))  CBC with Differential/Platelet     Status: Abnormal   Collection Time: 09/25/18  6:50  PM  Result Value Ref Range   WBC 14.1 (H) 4.0 - 10.5 K/uL   RBC 3.82 (L) 4.22 - 5.81 MIL/uL   Hemoglobin 11.0 (L) 13.0 - 17.0 g/dL   HCT 34.5 (L) 39.0 - 52.0 %   MCV 90.3 80.0 - 100.0 fL   MCH 28.8 26.0 - 34.0 pg   MCHC 31.9 30.0 - 36.0 g/dL   RDW 14.0 11.5 - 15.5 %   Platelets 239 150 - 400 K/uL   nRBC 0.0 0.0 - 0.2 %   Neutrophils Relative % 84 %   Neutro Abs 11.7 (H) 1.7 - 7.7 K/uL   Lymphocytes Relative 7 %   Lymphs Abs 1.0 0.7 - 4.0 K/uL   Monocytes Relative 9 %   Monocytes Absolute 1.3 (H) 0.1 - 1.0 K/uL   Eosinophils Relative 0 %    Eosinophils Absolute 0.0 0.0 - 0.5 K/uL   Basophils Relative 0 %   Basophils Absolute 0.1 0.0 - 0.1 K/uL   Immature Granulocytes 0 %   Abs Immature Granulocytes 0.05 0.00 - 0.07 K/uL    Comment: Performed at [facility], [address] [place], [place] [address]  Lactic acid, plasma     Status: None   Collection Time: 09/25/18  6:50 PM  Result Value Ref Range   Lactic Acid, Venous 1.1 0.5 - 1.9 mmol/L    Comment: Performed at [facility], [address] [place], [place] [address]  Comprehensive metabolic panel     Status: Abnormal   Collection Time: 09/25/18  6:50 PM  Result Value Ref Range   Sodium 135 135 - 145 mmol/L   Potassium 4.1 3.5 - 5.1 mmol/L   Chloride 94 (L) 98 - 111 mmol/L   CO2 29 22 - 32 mmol/L   Glucose, Bld 129 (H) 70 - 99 mg/dL   BUN 14 6 - 20 mg/dL   Creatinine, Ser 1.26 (H) 0.61 - 1.24 mg/dL   Calcium 8.9 8.9 - 10.3 mg/dL   Total Protein 7.2 6.5 - 8.1 g/dL   Albumin 3.4 (L) 3.5 - 5.0 g/dL   AST 15 15 - 41 U/L   ALT 8 0 - 44 U/L   Alkaline Phosphatase 84 38 - 126 U/L   Total Bilirubin 0.5 0.3 - 1.2 mg/dL   GFR calc non Af Amer >60 >60 mL/min   GFR calc Af Amer >60 >60 mL/min   Anion gap 12 5 - 15    Comment: Performed at [facility], [address] [place], [place] [address]  Troponin I - ONCE - STAT     Status: None   Collection Time: 09/25/18  6:50 PM  Result Value Ref Range   Troponin I <0.03 <0.03 ng/mL    Comment: Performed at [facility], [address] [place], [place] [address]  SARS Coronavirus 2 [facility] order, Performed in [place] hospital lab)     Status: None   Collection Time: 09/25/18  6:50 PM  Result Value Ref Range   SARS Coronavirus 2 NEGATIVE NEGATIVE    Comment: (NOTE) If result is NEGATIVE SARS-CoV-2 target nucleic acids are NOT DETECTED. The SARS-CoV-2 RNA is generally detectable in upper and lower  respiratory specimens during the acute phase of infection. The lowest   concentration of SARS-CoV-2 viral copies this assay can detect is 250  copies / mL. A negative result does not preclude SARS-CoV-2 infection  and should not be used as the sole basis for treatment or other  patient management decisions.  A negative result may occur with  improper specimen collection / handling, submission of specimen other  than nasopharyngeal swab, presence of viral mutation(s) within the  areas targeted by this assay, and inadequate number of viral copies  (<250 copies / mL). A negative result must be combined with clinical  observations, patient history, and epidemiological information. If result is POSITIVE SARS-CoV-2 target nucleic acids are DETECTED. The SARS-CoV-2 RNA is generally detectable in upper and lower  respiratory specimens dur ing the acute phase of infection.  Positive  results are indicative of active infection with SARS-CoV-2.  Clinical  correlation with patient history and other diagnostic information is  necessary to determine patient infection status.  Positive results do  not rule out bacterial infection or co-infection with other viruses. If result is PRESUMPTIVE POSTIVE SARS-CoV-2 nucleic acids MAY BE PRESENT.   A presumptive positive result was obtained on the submitted specimen  and confirmed on repeat testing.  While 2019 novel coronavirus  (SARS-CoV-2) nucleic acids may be present in the submitted sample  additional confirmatory testing may be necessary for epidemiological  and / or clinical management purposes  to differentiate between  SARS-CoV-2 and other Sarbecovirus currently known to infect humans.  If clinically indicated additional testing with an alternate test  methodology [phone]) is advised. The SARS-CoV-2 RNA is generally  detectable in upper and lower respiratory sp ecimens during the acute  phase of infection. The expected result is Negative. Fact Sheet for Patients:  StrictlyIdeas.no Fact Sheet  for Healthcare Providers: BankingDealers.co.za This test is not yet approved or cleared by the [place] FDA and has been authorized for detection and/or diagnosis of SARS-CoV-2 by FDA under an Emergency Use Authorization (EUA).  This EUA will remain in effect (meaning this test can be used) for the duration of the COVID-19 declaration under Section 564(b)(1) of the Act, 21 U.S.C. section 360bbb-3(b)(1), unless the authorization is terminated or revoked sooner. Performed at [facility], [place], [place] [address]    Ct Angio Chest Pe W And/or Wo Contrast  Result Date: 09/25/2018 CLINICAL DATA:  History of stage III lung cancer. No chemo or radiation at this time. Left-sided chest pain and fever starting yesterday. EXAM: CT ANGIOGRAPHY CHEST WITH CONTRAST TECHNIQUE: Multidetector CT imaging of the chest was performed using the standard protocol during bolus administration of intravenous contrast. Multiplanar CT image reconstructions and MIPs were obtained to evaluate the vascular anatomy. CONTRAST:  56mL OMNIPAQUE IOHEXOL 350 MG/ML SOLN COMPARISON:  Chest x-ray Sep 25, 2018.  Chest CT September 17, 2018. FINDINGS: Cardiovascular: The heart is unchanged. Mild atherosclerotic changes are seen in the thoracic aorta without aneurysm or dissection. The known narrowing of the left brachiocephalic vein and severe narrowing versus occlusion of the superior vena cava are not well appreciated on today's study at it has there is no opacification on the left, possibly due to timing of contrast. The collaterals in the chest wall remain. No acute pulmonary emboli. Mediastinum/Nodes: The thyroid and esophagus are unremarkable. A right hilar node on series 4, image 45 measures 15 mm today versus 11 mm previously. The low right paratracheal node on series 4, image 38 measures 17 mm on both studies in short axis. A prevascular node on series 4, image 39 measures 10 mm in  short axis today versus 6 mm June 29, 2018. No left hilar adenopathy. No other change in the mediastinal nodes. A small pericardial effusion is similar to mildly more  prominent the interval. The effusion measures 12 mm in thickness on series 4, image 83 today versus 6 mm in thickness previously. There is a small left pleural effusion which is new. Lungs/Pleura: The trachea and mainstem bronchi are patent. Left-sided central airways are normal. There is wall thickening associated with a right upper lobe branch as seen on axial image 39. There is soft tissue fullness in the right suprahilar region which is stable. There is short segment occlusion of a posterior right upper lobe branch of the bronchus. This is also stable. Narrowing of the right middle lobe bronchus as it arises from the bronchus intermedius is seen on series 6, image 54. Narrowing of a right lower lobe bronchus as it arises from the bronchus intermedius on image 56 is identified. There is associated thickening of the right bronchus intermedius. No pneumothorax. Emphysematous changes seen in the lungs. The right apically nodule on series 6, image 18 measures 10 by 5 mm today, unchanged. The site of the patient's primary malignancy in the medial right upper lobe measures 19 x 12 mm today, not significantly changed measuring 20 x 14 mm previously. The spiculated masslike opacity in the left lower lobe measures 2.7 by 2.5 cm today versus 2.8 x 2.7 cm previously, similar in the interval. This spiculated masslike opacity abuts a left lower lobe pulmonary artery. Just distal to this nodular density is a triangular region of opacification in the posterior left lower lobe, likely postobstructive change. Several tiny scattered nodules in the lungs are otherwise stable. No new nodules, masses, or infiltrates. Upper Abdomen: No acute abnormality. Musculoskeletal: No chest wall abnormality. No acute or significant osseous findings. Review of the MIP images  confirms the above findings. IMPRESSION: 1. No pulmonary emboli identified. 2. The right apical nodule on series 6, image 18 is stable. The site of the known malignancy in the medial right upper lobe is not significantly changed in the interval. 3. The spiculated masslike opacity in the left lower lobe is similar in the interval with associated postobstructive changes. This is not changed since September 17, 2018. The lesion is highly concerning for neoplasm, either metastatic or a primary bronchogenic neoplasm. A focal pneumonia is possible but less likely given the stability. 4. Soft tissue fullness in the right suprahilar region is stable. Bronchial wall thickening and narrowing of some upper lobe bronchial branches is stable. 5. There is increased thickening of the bronchus intermedius and branching vessels with some narrowing of right middle and lower lobe bronchi. This is a new finding. 6. New left pleural effusion. The pericardial effusion is a little larger in the interval. 7. A prevascular node and right hilar node are little larger in the interval. Other nodes are similar in the interval. 8. 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 This is not as well assessed today due to timing of contrast and neither of these veins opacifies on today's study. Aortic Atherosclerosis (ICD10-I70.0) and Emphysema (ICD10-J43.9). Electronically Signed   By: Dorise Bullion III M.D   On: 09/25/2018 20:31   Dg Chest Portable 1 View  Result Date: 09/25/2018 CLINICAL DATA:  LEFT side chest pain and fever EXAM: PORTABLE CHEST 1 VIEW COMPARISON:  Portable exam 1830 hours compared to 07/22/2018 Correlation: CT chest 09/17/2018 FINDINGS: Normal heart size, mediastinal contours, and pulmonary vascularity. Lungs emphysematous with probable BILATERAL nipple shadows. No acute infiltrate, pleural effusion or pneumothorax. Bones unremarkable. IMPRESSION: Emphysematous changes with  probable BILATERAL nipple shadows;  no definite pulmonary nodules at these sites on recent CT. No acute infiltrate. Electronically Signed   By: Lavonia Dana M.D.   On: 09/25/2018 18:56    Pending Labs Unresulted Labs (From admission, onward)    Start     Ordered   09/25/18 2050  Urinalysis, Routine w reflex microscopic  ONCE - STAT,   STAT     09/25/18 2049   09/25/18 2049  Blood culture (routine x 2)  BLOOD CULTURE X 2,   STAT     09/25/18 2048   Signed and Held  Basic metabolic panel  Tomorrow morning,   R     Signed and Held   Signed and Held  CBC  Tomorrow morning,   R     Signed and Held          Vitals/Pain Today's Vitals   09/25/18 1809 09/25/18 1810 09/25/18 1845 09/25/18 2058  BP:   118/79 122/84  Pulse:   (!) 121 (!) 121  Resp:   (!) 23 (!) 22  Temp:      TempSrc:      SpO2:   95% 95%  Weight:  57.2 kg    Height:  5\' 9"  (1.753 m)    PainSc: 3        Isolation Precautions Droplet and Contact precautions  Medications Medications  vancomycin (VANCOCIN) IVPB 1000 mg/200 mL premix (1,000 mg Intravenous New Bag/Given 09/25/18 2202)  sodium chloride 0.9 % bolus 1,000 mL (0 mLs Intravenous Stopped 09/25/18 2110)  iohexol (OMNIPAQUE) 350 MG/ML injection 75 mL (75 mLs Intravenous Contrast Given 09/25/18 1947)  ceFEPIme (MAXIPIME) 1 g in sodium chloride 0.9 % 100 mL IVPB (0 g Intravenous Stopped 09/25/18 2200)  sodium chloride 0.9 % bolus 1,000 mL (1,000 mLs Intravenous New Bag/Given 09/25/18 2117)    Mobility walks Low fall risk   Focused Assessments Pulmonary Assessment Handoff:  Lung sounds:   O2 Device: Room Air        R Recommendations: See Admitting Provider Note  Report given to:   Additional Notes:

## 2018-09-25 NOTE — ED Notes (Signed)
Attempted report 2nd time. On hold 9 mins - hung up.

## 2018-09-25 NOTE — ED Notes (Signed)
Cultures obtained and sent to lab and first antibiotic and fluids started.

## 2018-09-26 LAB — BASIC METABOLIC PANEL
Anion gap: 9 (ref 5–15)
BUN: 12 mg/dL (ref 6–20)
CO2: 26 mmol/L (ref 22–32)
Calcium: 8.8 mg/dL — ABNORMAL LOW (ref 8.9–10.3)
Chloride: 101 mmol/L (ref 98–111)
Creatinine, Ser: 1.11 mg/dL (ref 0.61–1.24)
GFR calc Af Amer: >60 mL/min (ref >60)
GFR calc non Af Amer: >60 mL/min (ref >60)
Glucose, Bld: 104 mg/dL — ABNORMAL HIGH (ref 70–99)
Potassium: 4 mmol/L (ref 3.5–5.1)
Sodium: 136 mmol/L (ref 135–145)

## 2018-09-26 LAB — CBC
HCT: 33.8 % — ABNORMAL LOW (ref 39.0–52.0)
Hemoglobin: 10.8 g/dL — ABNORMAL LOW (ref 13.0–17.0)
MCH: 28.8 pg (ref 26.0–34.0)
MCHC: 32 g/dL (ref 30.0–36.0)
MCV: 90.1 fL (ref 80.0–100.0)
Platelets: 216 10*3/uL (ref 150–400)
RBC: 3.75 MIL/uL — ABNORMAL LOW (ref 4.22–5.81)
RDW: 13.9 % (ref 11.5–15.5)
WBC: 13.6 10*3/uL — ABNORMAL HIGH (ref 4.0–10.5)
nRBC: 0 % (ref 0.0–0.2)

## 2018-09-26 LAB — URINALYSIS, ROUTINE W REFLEX MICROSCOPIC
Bacteria, UA: NONE SEEN
Bilirubin Urine: NEGATIVE
Glucose, UA: NEGATIVE mg/dL
Ketones, ur: 5 mg/dL — AB
Leukocytes,Ua: NEGATIVE
Nitrite: NEGATIVE
Protein, ur: NEGATIVE mg/dL
Specific Gravity, Urine: 1.024 (ref 1.005–1.030)
Squamous Epithelial / HPF: NONE SEEN (ref 0–5)
pH: 5 (ref 5.0–8.0)

## 2018-09-26 LAB — MRSA PCR SCREENING: MRSA by PCR: NEGATIVE

## 2018-09-26 MED ORDER — SODIUM CHLORIDE 0.9% FLUSH
3.0000 mL | Freq: Two times a day (BID) | INTRAVENOUS | Status: DC
  Administered 2018-09-26 (×2): 3 mL via INTRAVENOUS

## 2018-09-26 MED ORDER — VANCOMYCIN HCL IN DEXTROSE 1-5 GM/200ML-% IV SOLN
1000.0000 mg | INTRAVENOUS | Status: DC
  Administered 2018-09-26: 1000 mg via INTRAVENOUS
  Filled 2018-09-26 (×2): qty 200

## 2018-09-26 MED ORDER — SODIUM CHLORIDE 0.9% FLUSH
10.0000 mL | INTRAVENOUS | Status: DC | PRN
  Administered 2018-09-26 (×2): 10 mL via INTRAVENOUS
  Filled 2018-09-26 (×2): qty 10

## 2018-09-26 MED ORDER — SODIUM CHLORIDE 0.9 % IV SOLN
2.0000 g | Freq: Two times a day (BID) | INTRAVENOUS | Status: DC
  Administered 2018-09-26 (×2): 2 g via INTRAVENOUS
  Filled 2018-09-26 (×4): qty 2

## 2018-09-26 NOTE — Consult Note (Signed)
Pharmacy Antibiotic Note  Alexander [name] is a 53 y.o. male admitted on 09/25/2018 with sepsis.  Pharmacy has been consulted for Cefepime and vancomycin dosing.  Plan: Vancomycin 1g IV x 1 dose given in ED. Start Vancomycin 1000 mg IV Q 24 hrs. Goal AUC 400-550. Expected AUC: 472 SCr used: 1.26 TBW  Start Cefepime 2g IV every 12 hours   Height: 5\' 9"  (175.3 cm) Weight: 128 lb 1.6 oz (58.1 kg) IBW/kg (Calculated) : 70.7  Temp (24hrs), Avg:98.1 F (36.7 C), Min:97.9 F (36.6 C), Max:98.3 F (36.8 C)  Recent Labs  Lab 09/25/18 1850  WBC 14.1*  CREATININE 1.26*  LATICACIDVEN 1.1    Estimated Creatinine Clearance: 55.7 mL/min (A) (by C-G formula based on SCr of 1.26 mg/dL (H)).    Allergies  Allergen Reactions  . Doxycycline Swelling  . Penicillins Swelling    Did it involve swelling of the face/tongue/throat, SOB, or low BP? Unknown Did it involve sudden or severe rash/hives, skin peeling, or any reaction on the inside of your mouth or nose? Unknown Did you need to seek medical attention at a hospital or doctor's office? Unknown When did it last happen?Childhood If all above answers are "NO", may proceed with cephalosporin use.    Antimicrobials this admission: 5/3 vancomycin  >>  5/3 Cefepime >>   Microbiology results: 5/2 BCx: pending 5/3 MRSA PCR: pending 5/2 SARS Coronavirus 2: negative  Thank you for allowing pharmacy to be a part of this patient's care.  Pernell Dupre, PharmD, BCPS Clinical Pharmacist 09/26/2018 3:36 AM

## 2018-09-26 NOTE — Progress Notes (Signed)
[place] [facility]                                                                                                                                                                                  Patient Demographics   Alexander [name], is a 53 y.o. male, DOB - 09-18-1965, DPO:[id number]  Admit date - 09/25/2018   Admitting Physician Lance Coon, MD  Outpatient Primary MD for the patient is Patient, No Pcp Per   LOS - 1  Subjective: Patient's states that his breathing is improved continues to have cough productive Complains of pain in the Port-A-Cath site in the groin   Review of Systems:   CONSTITUTIONAL: No documented fever. No fatigue, weakness. No weight gain, no weight loss.  EYES: No blurry or double vision.  ENT: No tinnitus. No postnasal drip. No redness of the oropharynx.  RESPIRATORY: Positive cough, no wheeze, no hemoptysis.  Positive dyspnea.  CARDIOVASCULAR: No chest pain. No orthopnea. No palpitations. No syncope.  GASTROINTESTINAL: No nausea, no vomiting or diarrhea. No abdominal pain. No melena or hematochezia.  GENITOURINARY: No dysuria or hematuria.  ENDOCRINE: No polyuria or nocturia. No heat or cold intolerance.  HEMATOLOGY: No anemia. No bruising. No bleeding.  INTEGUMENTARY: No rashes. No lesions.  MUSCULOSKELETAL: No arthritis. No swelling. No gout.  NEUROLOGIC: No numbness, tingling, or ataxia. No seizure-type activity.  PSYCHIATRIC: No anxiety. No insomnia. No ADD.    Vitals:   Vitals:   09/25/18 2058 09/25/18 2317 09/26/18 0513 09/26/18 0753  BP: 122/84 117/84 119/83 120/85  Pulse: (!) 121 (!) 115 (!) 112 (!) 114  Resp: (!) 22   20  Temp:  97.9 F (36.6 C) 97.6 F (36.4 C) (!) 97.5 F (36.4 C)  TempSrc:  Oral Oral Oral  SpO2: 95% 95% 95% 95%  Weight:  58.1 kg    Height:  5\' 9"  (1.753 m)      Wt Readings from Last 3 Encounters:  09/25/18 58.1 kg  08/06/18 62.4 kg  07/22/18 62.9 kg     Intake/Output Summary  (Last 24 hours) at 09/26/2018 0932 Last data filed at 09/26/2018 0300 Gross per 24 hour  Intake 0 ml  Output -  Net 0 ml    Physical Exam:   GENERAL: Pleasant-appearing in no apparent distress.  HEAD, EYES, EARS, NOSE AND THROAT: Atraumatic, normocephalic. Extraocular muscles are intact. Pupils equal and reactive to light. Sclerae anicteric. No conjunctival injection. No oro-pharyngeal erythema.  NECK: Supple. There is no jugular venous distention. No bruits, no lymphadenopathy, no thyromegaly.  HEART: Regular rate and rhythm,. No murmurs, no rubs, no clicks.  LUNGS: Rhonchus breath sounds without accessory muscle  use ABDOMEN: Soft, flat, nontender, nondistended. Has good bowel sounds. No hepatosplenomegaly appreciated.  EXTREMITIES: No evidence of any cyanosis, clubbing, or peripheral edema.  +2 pedal and radial pulses bilaterally.  NEUROLOGIC: The patient is alert, awake, and oriented x3 with no focal motor or sensory deficits appreciated bilaterally.  SKIN: Moist and warm with no rashes appreciated.  Psych: Not anxious, depressed LN: No inguinal LN enlargement    Antibiotics   Anti-infectives (From admission, onward)   Start     Dose/Rate Route Frequency Ordered Stop   09/26/18 2000  vancomycin (VANCOCIN) IVPB 1000 mg/200 mL premix     1,000 mg 200 mL/hr over 60 Minutes Intravenous Every 24 hours 09/26/18 0333     09/26/18 1000  ceFEPIme (MAXIPIME) 2 g in sodium chloride 0.9 % 100 mL IVPB     2 g 200 mL/hr over 30 Minutes Intravenous Every 12 hours 09/26/18 0333     09/25/18 2100  ceFEPIme (MAXIPIME) 1 g in sodium chloride 0.9 % 100 mL IVPB     1 g 200 mL/hr over 30 Minutes Intravenous  Once 09/25/18 2048 09/25/18 2200   09/25/18 2100  vancomycin (VANCOCIN) IVPB 1000 mg/200 mL premix     1,000 mg 200 mL/hr over 60 Minutes Intravenous  Once 09/25/18 2048 09/25/18 2310      Medications   Scheduled Meds: . apixaban  5 mg Oral BID   Continuous Infusions: . ceFEPime  (MAXIPIME) IV    . vancomycin     PRN Meds:.acetaminophen **OR** acetaminophen, benzonatate, guaiFENesin-dextromethorphan, ipratropium-albuterol, ondansetron **OR** ondansetron (ZOFRAN) IV, oxyCODONE   Data Review:   Micro Results Recent Results (from the past 240 hour(s))  SARS Coronavirus 2 [facility] order, Performed in [place] hospital lab)     Status: None   Collection Time: 09/25/18  6:50 PM  Result Value Ref Range Status   SARS Coronavirus 2 NEGATIVE NEGATIVE Final    Comment: (NOTE) If result is NEGATIVE SARS-CoV-2 target nucleic acids are NOT DETECTED. The SARS-CoV-2 RNA is generally detectable in upper and lower  respiratory specimens during the acute phase of infection. The lowest  concentration of SARS-CoV-2 viral copies this assay can detect is 250  copies / mL. A negative result does not preclude SARS-CoV-2 infection  and should not be used as the sole basis for treatment or other  patient management decisions.  A negative result may occur with  improper specimen collection / handling, submission of specimen other  than nasopharyngeal swab, presence of viral mutation(s) within the  areas targeted by this assay, and inadequate number of viral copies  (<250 copies / mL). A negative result must be combined with clinical  observations, patient history, and epidemiological information. If result is POSITIVE SARS-CoV-2 target nucleic acids are DETECTED. The SARS-CoV-2 RNA is generally detectable in upper and lower  respiratory specimens dur ing the acute phase of infection.  Positive  results are indicative of active infection with SARS-CoV-2.  Clinical  correlation with patient history and other diagnostic information is  necessary to determine patient infection status.  Positive results do  not rule out bacterial infection or co-infection with other viruses. If result is PRESUMPTIVE POSTIVE SARS-CoV-2 nucleic acids MAY BE PRESENT.   A presumptive positive result  was obtained on the submitted specimen  and confirmed on repeat testing.  While 2019 novel coronavirus  (SARS-CoV-2) nucleic acids may be present in the submitted sample  additional confirmatory testing may be necessary for epidemiological  and / or clinical management  purposes  to differentiate between  SARS-CoV-2 and other Sarbecovirus currently known to infect humans.  If clinically indicated additional testing with an alternate test  methodology [phone]) is advised. The SARS-CoV-2 RNA is generally  detectable in upper and lower respiratory sp ecimens during the acute  phase of infection. The expected result is Negative. Fact Sheet for Patients:  StrictlyIdeas.no Fact Sheet for Healthcare Providers: BankingDealers.co.za This test is not yet approved or cleared by the [place] FDA and has been authorized for detection and/or diagnosis of SARS-CoV-2 by FDA under an Emergency Use Authorization (EUA).  This EUA will remain in effect (meaning this test can be used) for the duration of the COVID-19 declaration under Section 564(b)(1) of the Act, 21 U.S.C. section 360bbb-3(b)(1), unless the authorization is terminated or revoked sooner. Performed at [facility], [address] [place], [place] [address]   Blood culture (routine x 2)     Status: None (Preliminary result)   Collection Time: 09/25/18  9:11 PM  Result Value Ref Range Status   Specimen Description BLOOD RIGHT ANTECUBITAL  Final   Special Requests   Final    BOTTLES DRAWN AEROBIC AND ANAEROBIC Blood Culture adequate volume   Culture   Final    NO GROWTH < 12 HOURS Performed at [facility], [address] [place], [place] [address]    Report Status PENDING  Incomplete  Blood culture (routine x 2)     Status: None (Preliminary result)   Collection Time: 09/25/18  9:16 PM  Result Value Ref Range Status   Specimen Description BLOOD LEFT ANTECUBITAL   Final   Special Requests   Final    BOTTLES DRAWN AEROBIC AND ANAEROBIC Blood Culture adequate volume   Culture   Final    NO GROWTH < 12 HOURS Performed at [facility], [address] [place], [place] [address]    Report Status PENDING  Incomplete  MRSA PCR Screening     Status: None   Collection Time: 09/26/18  6:31 AM  Result Value Ref Range Status   MRSA by PCR NEGATIVE NEGATIVE Final    Comment:        The GeneXpert MRSA Assay (FDA approved for NASAL specimens only), is one component of a comprehensive MRSA colonization surveillance program. It is not intended to diagnose MRSA infection nor to guide or monitor treatment for MRSA infections. Performed at [facility], [address] [place], [place] [address]     Radiology Reports Ct Soft Tissue Neck W Contrast  Result Date: 09/17/2018 CLINICAL DATA:  Lung cancer. Restaging. Current every day smoker. Pressure sensation in neck. Completion of radiation and chemotherapy. EXAM: CT NECK WITH CONTRAST TECHNIQUE: Multidetector CT imaging of the neck was performed using the standard protocol following the bolus administration of intravenous contrast. CONTRAST:  180mL OMNIPAQUE IOHEXOL 300 MG/ML  SOLN COMPARISON:  PET scan 03/18/2018. FINDINGS: Pharynx and larynx: Asymmetric fullness versus coaptation LEFT vallecula. Difficult to exclude lingual tonsillar enlargement on the LEFT. Elsewhere asymmetric fullness LEFT oropharynx. If further investigation desired, consider direct inspection, or repeat PET scan. See image 55 series 17, and series 13, image 55. Normal larynx. Normal nasopharynx. Salivary glands: No inflammation, mass, or stone. Thyroid: Normal. Lymph nodes: None enlarged or abnormal density. Vascular: Negative. Limited intracranial: Negative. Visualized orbits: Negative. Mastoids and visualized paranasal sinuses: Clear. Skeleton: No acute or aggressive process. Upper chest: Reported separately. Other: None.  IMPRESSION: 1. Asymmetric fullness of the LEFT oropharynx. Correlate clinically for  tonsillitis or tonsillar mass. Asymmetry of the LEFT vallecula. Consider direct inspection. 2. No pathologic adenopathy. Electronically Signed   By: Staci Righter M.D.   On: 09/17/2018 16:32   Ct Chest W Contrast  Result Date: 09/17/2018 CLINICAL DATA:  53 year old male with history of right-sided lung cancer diagnosed last year. Follow-up study. EXAM: CT CHEST, ABDOMEN, AND PELVIS WITH CONTRAST TECHNIQUE: Multidetector CT imaging of the chest, abdomen and pelvis was performed following the standard protocol during bolus administration of intravenous contrast. CONTRAST:  181mL OMNIPAQUE IOHEXOL 300 MG/ML  SOLN COMPARISON:  CT the chest, abdomen and pelvis 05/31/2018. FINDINGS: CT CHEST FINDINGS Cardiovascular: Heart size is normal. Small amount of pericardial fluid and/or thickening, increased compared to the prior study. This is unlikely to be of hemodynamic significance at this time. No associated pericardial calcification. No atherosclerotic calcifications in the thoracic aorta or the coronary arteries. 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 Mediastinum/Nodes: Multiple prominent borderline enlarged and mildly enlarged right hilar and mediastinal lymph nodes, largest of which is a low right paratracheal lymph node measuring 1.7 cm in short axis (axial image 28 of series 4). No left hilar lymphadenopathy. Esophagus is unremarkable in appearance. No axillary lymphadenopathy. Lungs/Pleura: When compared to the prior examination there is a new mass-like area in the central aspect of the left lower lobe (axial image 106 of series 5 and coronal image 102 of series 6) measuring 2.7 x 2.8 x 3.5 cm, with macrolobulated slightly spiculated margins. In the distal aspect of the left lower lobe beyond this lesion  there is a wedge-shaped density (axial image 117 of series 5), favored to reflect some postobstructive atelectasis/consolidation. There are few new patchy areas of peribronchovascular ground-glass attenuation in the right middle lobe, favored to be infectious or inflammatory in etiology. Previously treated right upper lobe nodule is similar to prior examinations measuring 2.0 x 1.4 cm (axial image 32 of series 5). Other areas of nodularity noted elsewhere in the lungs appear stable compared to prior examinations, most notably a 1.0 x 0.5 cm nodule in the right upper lobe near the apex (axial image 37 of series 5). Diffuse bronchial wall thickening with mild centrilobular and paraseptal emphysema. No pleural effusions. Musculoskeletal: There are no aggressive appearing lytic or blastic lesions noted in the visualized portions of the skeleton. CT ABDOMEN PELVIS FINDINGS Hepatobiliary: No suspicious cystic or solid hepatic lesions. No intra or extrahepatic biliary ductal dilatation. Gallbladder is normal in appearance. Pancreas: No definite pancreatic mass or peripancreatic fluid or inflammatory changes. Spleen: Unremarkable. Adrenals/Urinary Tract: Bilateral kidneys and adrenal glands are normal in appearance. No hydroureteronephrosis. Urinary bladder is normal in appearance. Stomach/Bowel: Normal appearance of the stomach. No pathologic dilatation of small bowel or colon. The appendix is not confidently identified and may be surgically absent. Regardless, there are no inflammatory changes noted adjacent to the cecum to suggest the presence of an acute appendicitis at this time. Vascular/Lymphatic: Aortic atherosclerosis, without evidence of aneurysm or dissection in the abdominal or pelvic vasculature. Numerous venous collaterals are noted throughout the anterior abdominal wall bilaterally (left greater than right). Right femoral central venous catheter with tip terminating in the intrahepatic portion of the inferior  vena cava. No lymphadenopathy noted in the abdomen or pelvis. Reproductive: Prostate gland and seminal vesicles are unremarkable in appearance. Other: No significant volume of ascites.  No pneumoperitoneum. Musculoskeletal: Small sclerotic lesions in the sacrum bilaterally  with narrow zones of transition, likely to represent bone islands, stable compared to prior studies measuring up to 8 mm in the left side of the sacrum. There are no aggressive appearing lytic or blastic lesions noted in the visualized portions of the skeleton. IMPRESSION: 1. New mass-like area in the central aspect of the left lower lobe with some associated postobstructive changes. Although this developed very rapidly, which is most characteristic of infection, the appearance of the main nidus of the lesion is highly concerning for neoplasm, either metastatic or a new primary bronchogenic neoplasm. Further clinical evaluation is recommended. 2. No signs of metastatic disease in the abdomen or pelvis. 3. New peribronchovascular ground-glass attenuation throughout the right middle lobe concerning for infectious or inflammatory process. 4. 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 5. Additional incidental findings, as above. Electronically Signed   By: Vinnie Langton M.D.   On: 09/17/2018 16:48   Ct Angio Chest Pe W And/or Wo Contrast  Result Date: 09/25/2018 CLINICAL DATA:  History of stage III lung cancer. No chemo or radiation at this time. Left-sided chest pain and fever starting yesterday. EXAM: CT ANGIOGRAPHY CHEST WITH CONTRAST TECHNIQUE: Multidetector CT imaging of the chest was performed using the standard protocol during bolus administration of intravenous contrast. Multiplanar CT image reconstructions and MIPs were obtained to evaluate the vascular anatomy. CONTRAST:  27mL OMNIPAQUE IOHEXOL 350 MG/ML SOLN COMPARISON:  Chest x-ray Sep 25, 2018.  Chest CT September 17, 2018. FINDINGS: Cardiovascular: The heart is unchanged. Mild atherosclerotic changes are seen in the thoracic aorta without aneurysm or dissection. The known narrowing of the left brachiocephalic vein and severe narrowing versus occlusion of the superior vena cava are not well appreciated on today's study at it has there is no opacification on the left, possibly due to timing of contrast. The collaterals in the chest wall remain. No acute pulmonary emboli. Mediastinum/Nodes: The thyroid and esophagus are unremarkable. A right hilar node on series 4, image 45 measures 15 mm today versus 11 mm previously. The low right paratracheal node on series 4, image 38 measures 17 mm on both studies in short axis. A prevascular node on series 4, image 39 measures 10 mm in short axis today versus 6 mm June 29, 2018. No left hilar adenopathy. No other change in the mediastinal nodes. A small pericardial effusion is similar to mildly more prominent the interval. The effusion measures 12 mm in thickness on series 4, image 83 today versus 6 mm in thickness previously. There is a small left pleural effusion which is new. Lungs/Pleura: The trachea and mainstem bronchi are patent. Left-sided central airways are normal. There is wall thickening associated with a right upper lobe branch as seen on axial image 39. There is soft tissue fullness in the right suprahilar region which is stable. There is short segment occlusion of a posterior right upper lobe branch of the bronchus. This is also stable. Narrowing of the right middle lobe bronchus as it arises from the bronchus intermedius is seen on series 6, image 54. Narrowing of a right lower lobe bronchus as it arises from the bronchus intermedius on image 56 is identified. There is associated thickening of the right bronchus intermedius. No pneumothorax. Emphysematous changes seen in the lungs. The right apically nodule on series 6, image 18 measures 10 by 5  mm today, unchanged. The site of the patient's primary malignancy in the medial  right upper lobe measures 19 x 12 mm today, not significantly changed measuring 20 x 14 mm previously. The spiculated masslike opacity in the left lower lobe measures 2.7 by 2.5 cm today versus 2.8 x 2.7 cm previously, similar in the interval. This spiculated masslike opacity abuts a left lower lobe pulmonary artery. Just distal to this nodular density is a triangular region of opacification in the posterior left lower lobe, likely postobstructive change. Several tiny scattered nodules in the lungs are otherwise stable. No new nodules, masses, or infiltrates. Upper Abdomen: No acute abnormality. Musculoskeletal: No chest wall abnormality. No acute or significant osseous findings. Review of the MIP images confirms the above findings. IMPRESSION: 1. No pulmonary emboli identified. 2. The right apical nodule on series 6, image 18 is stable. The site of the known malignancy in the medial right upper lobe is not significantly changed in the interval. 3. The spiculated masslike opacity in the left lower lobe is similar in the interval with associated postobstructive changes. This is not changed since September 17, 2018. The lesion is highly concerning for neoplasm, either metastatic or a primary bronchogenic neoplasm. A focal pneumonia is possible but less likely given the stability. 4. Soft tissue fullness in the right suprahilar region is stable. Bronchial wall thickening and narrowing of some upper lobe bronchial branches is stable. 5. There is increased thickening of the bronchus intermedius and branching vessels with some narrowing of right middle and lower lobe bronchi. This is a new finding. 6. New left pleural effusion. The pericardial effusion is a little larger in the interval. 7. A prevascular node and right hilar node are little larger in the interval. Other nodes are similar in the interval. 8. 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 This is not as well assessed today due to timing of contrast and neither of these veins opacifies on today's study. Aortic Atherosclerosis (ICD10-I70.0) and Emphysema (ICD10-J43.9). Electronically Signed   By: Dorise Bullion III M.D   On: 09/25/2018 20:31   Ct Abdomen Pelvis W Contrast  Result Date: 09/17/2018 CLINICAL DATA:  53 year old male with history of right-sided lung cancer diagnosed last year. Follow-up study. EXAM: CT CHEST, ABDOMEN, AND PELVIS WITH CONTRAST TECHNIQUE: Multidetector CT imaging of the chest, abdomen and pelvis was performed following the standard protocol during bolus administration of intravenous contrast. CONTRAST:  156mL OMNIPAQUE IOHEXOL 300 MG/ML  SOLN COMPARISON:  CT the chest, abdomen and pelvis 05/31/2018. FINDINGS: CT CHEST FINDINGS Cardiovascular: Heart size is normal. Small amount of pericardial fluid and/or thickening, increased compared to the prior study. This is unlikely to be of hemodynamic significance at this time. No associated pericardial calcification. No atherosclerotic calcifications in the thoracic aorta or the coronary arteries. 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 Mediastinum/Nodes: Multiple prominent borderline enlarged and mildly enlarged right hilar and mediastinal lymph nodes, largest of which is a low right paratracheal lymph node measuring 1.7 cm in short axis (axial image 28 of series 4). No left hilar lymphadenopathy. Esophagus is unremarkable in appearance. No axillary lymphadenopathy. Lungs/Pleura: When compared to the prior examination there is a new mass-like area in the central aspect of the left lower lobe (axial image 106 of series 5 and coronal image 102 of series 6) measuring 2.7 x 2.8 x 3.5 cm, with macrolobulated slightly spiculated margins. In the  distal aspect of the  left lower lobe beyond this lesion there is a wedge-shaped density (axial image 117 of series 5), favored to reflect some postobstructive atelectasis/consolidation. There are few new patchy areas of peribronchovascular ground-glass attenuation in the right middle lobe, favored to be infectious or inflammatory in etiology. Previously treated right upper lobe nodule is similar to prior examinations measuring 2.0 x 1.4 cm (axial image 32 of series 5). Other areas of nodularity noted elsewhere in the lungs appear stable compared to prior examinations, most notably a 1.0 x 0.5 cm nodule in the right upper lobe near the apex (axial image 37 of series 5). Diffuse bronchial wall thickening with mild centrilobular and paraseptal emphysema. No pleural effusions. Musculoskeletal: There are no aggressive appearing lytic or blastic lesions noted in the visualized portions of the skeleton. CT ABDOMEN PELVIS FINDINGS Hepatobiliary: No suspicious cystic or solid hepatic lesions. No intra or extrahepatic biliary ductal dilatation. Gallbladder is normal in appearance. Pancreas: No definite pancreatic mass or peripancreatic fluid or inflammatory changes. Spleen: Unremarkable. Adrenals/Urinary Tract: Bilateral kidneys and adrenal glands are normal in appearance. No hydroureteronephrosis. Urinary bladder is normal in appearance. Stomach/Bowel: Normal appearance of the stomach. No pathologic dilatation of small bowel or colon. The appendix is not confidently identified and may be surgically absent. Regardless, there are no inflammatory changes noted adjacent to the cecum to suggest the presence of an acute appendicitis at this time. Vascular/Lymphatic: Aortic atherosclerosis, without evidence of aneurysm or dissection in the abdominal or pelvic vasculature. Numerous venous collaterals are noted throughout the anterior abdominal wall bilaterally (left greater than right). Right femoral central venous catheter with tip  terminating in the intrahepatic portion of the inferior vena cava. No lymphadenopathy noted in the abdomen or pelvis. Reproductive: Prostate gland and seminal vesicles are unremarkable in appearance. Other: No significant volume of ascites.  No pneumoperitoneum. Musculoskeletal: Small sclerotic lesions in the sacrum bilaterally with narrow zones of transition, likely to represent bone islands, stable compared to prior studies measuring up to 8 mm in the left side of the sacrum. There are no aggressive appearing lytic or blastic lesions noted in the visualized portions of the skeleton. IMPRESSION: 1. New mass-like area in the central aspect of the left lower lobe with some associated postobstructive changes. Although this developed very rapidly, which is most characteristic of infection, the appearance of the main nidus of the lesion is highly concerning for neoplasm, either metastatic or a new primary bronchogenic neoplasm. Further clinical evaluation is recommended. 2. No signs of metastatic disease in the abdomen or pelvis. 3. New peribronchovascular ground-glass attenuation throughout the right middle lobe concerning for infectious or inflammatory process. 4. 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 5. Additional incidental findings, as above. Electronically Signed   By: Vinnie Langton M.D.   On: 09/17/2018 16:48   Dg Chest Portable 1 View  Result Date: 09/25/2018 CLINICAL DATA:  LEFT side chest pain and fever EXAM: PORTABLE CHEST 1 VIEW COMPARISON:  Portable exam 1830 hours compared to 07/22/2018 Correlation: CT chest 09/17/2018 FINDINGS: Normal heart size, mediastinal contours, and pulmonary vascularity. Lungs emphysematous with probable BILATERAL nipple shadows. No acute infiltrate, pleural effusion or pneumothorax. Bones unremarkable. IMPRESSION: Emphysematous changes with probable BILATERAL nipple  shadows; no definite pulmonary nodules at these sites on recent CT. No acute infiltrate. Electronically Signed   By: Lavonia Dana M.D.   On: 09/25/2018 18:56     CBC Recent Labs  Lab  09/25/18 1850 09/26/18 0635  WBC 14.1* 13.6*  HGB 11.0* 10.8*  HCT 34.5* 33.8*  PLT 239 216  MCV 90.3 90.1  MCH 28.8 28.8  MCHC 31.9 32.0  RDW 14.0 13.9  LYMPHSABS 1.0  --   MONOABS 1.3*  --   EOSABS 0.0  --   BASOSABS 0.1  --     Chemistries  Recent Labs  Lab 09/25/18 1850 09/26/18 0635  NA 135 136  K 4.1 4.0  CL 94* 101  CO2 29 26  GLUCOSE 129* 104*  BUN 14 12  CREATININE 1.26* 1.11  CALCIUM 8.9 8.8*  AST 15  --   ALT 8  --   ALKPHOS 84  --   BILITOT 0.5  --    ------------------------------------------------------------------------------------------------------------------ estimated creatinine clearance is 63.2 mL/min (by C-G formula based on SCr of 1.11 mg/dL). ------------------------------------------------------------------------------------------------------------------ No results for input(s): HGBA1C in the last 72 hours. ------------------------------------------------------------------------------------------------------------------ No results for input(s): CHOL, HDL, LDLCALC, TRIG, CHOLHDL, LDLDIRECT in the last 72 hours. ------------------------------------------------------------------------------------------------------------------ No results for input(s): TSH, T4TOTAL, T3FREE, THYROIDAB in the last 72 hours.  Invalid input(s): FREET3 ------------------------------------------------------------------------------------------------------------------ No results for input(s): VITAMINB12, FOLATE, FERRITIN, TIBC, IRON, RETICCTPCT in the last 72 hours.  Coagulation profile No results for input(s): INR, PROTIME in the last 168 hours.  No results for input(s): DDIMER in the last 72 hours.  Cardiac Enzymes Recent Labs  Lab 09/25/18 1850  TROPONINI <0.03    ------------------------------------------------------------------------------------------------------------------ Invalid input(s): POCBNP    Assessment & Plan  Patient is 53 year old with lung cancer presenting with shortness of breath and cough   Sepsis ([place]) -continue IV antibiotic   HCAP (healthcare-associated pneumonia) -continue vancomycin and cefepime   COPD (chronic obstructive pulmonary disease) ([place]) -home dose inhalers   Lung cancer ([place]) -not currently receiving treatment for this.    Outpatient oncology follow-up       Code Status Orders  (From admission, onward)         Start     Ordered   09/25/18 2321  Full code  Continuous     09/25/18 2320        Code Status History    Date Active Date Inactive Code Status Order ID Comments User Context   06/30/2018 0421 07/03/2018 1452 Partial Code [id number]  Arnell Asal, NP Inpatient   06/30/2018 0236 06/30/2018 0421 Full Code [id number]  Frederik Pear, MD Inpatient   03/09/2018 1812 03/13/2018 1920 Full Code [id number]  Saundra Shelling, MD Inpatient    Advance Directive Documentation     Most Recent Value  Type of Advance Directive  Living will  Pre-existing out of facility DNR order (yellow form or pink MOST form)  -  "MOST" Form in Place?  -           Consults none  DVT Prophylaxis  Lovenox   Lab Results  Component Value Date   PLT 216 09/26/2018     Time Spent in minutes 35 minutes greater than 50% of time spent in care coordination and counseling patient regarding the condition and plan of care.   Dustin Flock M.D on 09/26/2018 at 9:32 AM  Between 7am to 6pm - Pager - [phone]  After 6pm go to [email or web address] - Proofreader  [employer]   Office  [phone]

## 2018-09-26 NOTE — Progress Notes (Signed)
Advanced care plan.  Purpose of the Encounter: CODE STATUS  Parties in Attendance: Patient himself  Patient's Decision Capacity: Intact  Subjective/Patient's story: Patient is 53 year old with lung cancer, COPD who is receiving chemotherapy in the past and radiation presenting with worsening shortness of breath and cough.    Objective/Medical story I discussed with the patient regarding his desires for cardiac and pulmonary resuscitation and asked them about a living will and healthcare power of attorney   Goals of care determination:  Patient states that he wants to be a full code, does not have a living will or healthcare power of attorney   CODE STATUS: Full code   Time spent discussing advanced care planning: 16 minutes

## 2018-09-26 NOTE — Progress Notes (Addendum)
Pt reports feeling better; slept at intervals. No sputum produced since sputum cup given this morning thus far. Tylenol  And oxycodone effective for mild chest discomfort x 1.  IV antibiotic therapy continue. Right leg port accessed for IV therapy/labs. Pt reports pain in right upper leg/hip area with certain movements which he relates to port site. Pt noted to have slow thought processes to answer questions/provide information at intervals.

## 2018-09-27 ENCOUNTER — Inpatient Hospital Stay: Payer: Self-pay

## 2018-09-27 ENCOUNTER — Ambulatory Visit: Payer: Self-pay

## 2018-09-27 LAB — EXPECTORATED SPUTUM ASSESSMENT W GRAM STAIN, RFLX TO RESP C: Special Requests: NORMAL

## 2018-09-27 LAB — BASIC METABOLIC PANEL WITH GFR
Anion gap: 8 (ref 5–15)
BUN: 14 mg/dL (ref 6–20)
CO2: 29 mmol/L (ref 22–32)
Calcium: 8.7 mg/dL — ABNORMAL LOW (ref 8.9–10.3)
Chloride: 103 mmol/L (ref 98–111)
Creatinine, Ser: 1.09 mg/dL (ref 0.61–1.24)
GFR calc Af Amer: >60 mL/min
GFR calc non Af Amer: >60 mL/min
Glucose, Bld: 101 mg/dL — ABNORMAL HIGH (ref 70–99)
Potassium: 3.6 mmol/L (ref 3.5–5.1)
Sodium: 140 mmol/L (ref 135–145)

## 2018-09-27 LAB — CBC
HCT: 32.7 % — ABNORMAL LOW (ref 39.0–52.0)
Hemoglobin: 10.4 g/dL — ABNORMAL LOW (ref 13.0–17.0)
MCH: 28.5 pg (ref 26.0–34.0)
MCHC: 31.8 g/dL (ref 30.0–36.0)
MCV: 89.6 fL (ref 80.0–100.0)
Platelets: 239 10*3/uL (ref 150–400)
RBC: 3.65 MIL/uL — ABNORMAL LOW (ref 4.22–5.81)
RDW: 13.8 % (ref 11.5–15.5)
WBC: 11.9 10*3/uL — ABNORMAL HIGH (ref 4.0–10.5)
nRBC: 0 % (ref 0.0–0.2)

## 2018-09-27 LAB — PROCALCITONIN: Procalcitonin: 0.11 ng/mL

## 2018-09-27 MED ORDER — BENZONATATE 200 MG PO CAPS: 200.0000 mg | ORAL_CAPSULE | Freq: Three times a day (TID) | ORAL | 0 refills | Status: DC | PRN

## 2018-09-27 MED ORDER — OXYCODONE-ACETAMINOPHEN 5-325 MG PO TABS: 1.0000 | ORAL_TABLET | Freq: Four times a day (QID) | ORAL | 0 refills | Status: DC | PRN

## 2018-09-27 MED ORDER — IPRATROPIUM-ALBUTEROL 20-100 MCG/ACT IN AERS: 1.0000 | INHALATION_SPRAY | Freq: Four times a day (QID) | RESPIRATORY_TRACT | 2 refills | Status: DC | PRN

## 2018-09-27 MED ORDER — LEVOFLOXACIN 500 MG PO TABS: 500.0000 mg | ORAL_TABLET | Freq: Every day | ORAL | 0 refills | Status: DC

## 2018-09-27 MED ORDER — GUAIFENESIN-DM 100-10 MG/5ML PO SYRP: 5.0000 mL | ORAL_SOLUTION | ORAL | 0 refills | Status: DC | PRN

## 2018-09-27 NOTE — Progress Notes (Signed)
Patient discharge instructions reviewed with him. Patient verbalized understanding. IV removed. Port deaccessed. Patient dressed himself and is now waiting on ride.

## 2018-09-27 NOTE — Discharge Summary (Signed)
[name] at [facility], [place] y.o., DOB June 02, 1965, MRN [id number]. Admission date: 09/25/2018 Discharge Date 09/27/2018 Primary MD Alexander [name], No Pcp Per Admitting Physician Lance Coon, MD  Admission Diagnosis  HCAP (healthcare-associated pneumonia) [J18.9] Sepsis without acute organ dysfunction, due to unspecified organism [facility]) [A41.9]  Discharge Diagnosis   Principal Problem:   Sepsis ([place]) due to possible healthcare pneumonia   Lung cancer ([place])   COPD (chronic obstructive pulmonary disease) [facility]) Chronic SVC syndrome        Hospital Course Alexander [name]  is a 53 y.o. male who presents with chief complaint as above.  Alexander [name] presents to the ED with 2 days of increasing cough, fever at home.  His cough is productive of sputum.  He has a history of lung cancer which was treated in the past with chemotherapy and radiation.  He is not currently on any active chemo or radiation therapy.  Here in the ED his work-up is consistent with likely pneumonia.  Alexander [name] was admitted to the hospital started on IV antibiotics.  With improvement in his symptoms.  Currently he is on room air and doing much better.  He does have a history of pericardial effusion requiring window in the past.  Oncology suggested possible echocardiogram however Alexander [name] CT scan showed no evidence of pericardial effusion.  At this point he is doing better he will have outpatient PET scan and schedule follow-up with oncology.  He also has an appointment to follow with pulmonary for outpatient bronchoscopy.            Consults  None  Significant Tests:  See full reports for all details     Ct Soft Tissue Neck W Contrast  Result Date: 09/17/2018 CLINICAL DATA:  Lung cancer. Restaging. Current every day smoker. Pressure sensation in neck. Completion of radiation and chemotherapy. EXAM: CT NECK WITH CONTRAST TECHNIQUE: Multidetector CT imaging of the neck was performed using the  standard protocol following the bolus administration of intravenous contrast. CONTRAST:  172mL OMNIPAQUE IOHEXOL 300 MG/ML  SOLN COMPARISON:  PET scan 03/18/2018. FINDINGS: Pharynx and larynx: Asymmetric fullness versus coaptation LEFT vallecula. Difficult to exclude lingual tonsillar enlargement on the LEFT. Elsewhere asymmetric fullness LEFT oropharynx. If further investigation desired, consider direct inspection, or repeat PET scan. See image 55 series 17, and series 13, image 55. Normal larynx. Normal nasopharynx. Salivary glands: No inflammation, mass, or stone. Thyroid: Normal. Lymph nodes: None enlarged or abnormal density. Vascular: Negative. Limited intracranial: Negative. Visualized orbits: Negative. Mastoids and visualized paranasal sinuses: Clear. Skeleton: No acute or aggressive process. Upper chest: Reported separately. Other: None. IMPRESSION: 1. Asymmetric fullness of the LEFT oropharynx. Correlate clinically for tonsillitis or tonsillar mass. Asymmetry of the LEFT vallecula. Consider direct inspection. 2. No pathologic adenopathy. Electronically Signed   By: Staci Righter M.D.   On: 09/17/2018 16:32   Ct Chest W Contrast  Result Date: 09/17/2018 CLINICAL DATA:  53 year old male with history of right-sided lung cancer diagnosed last year. Follow-up study. EXAM: CT CHEST, ABDOMEN, AND PELVIS WITH CONTRAST TECHNIQUE: Multidetector CT imaging of the chest, abdomen and pelvis was performed following the standard protocol during bolus administration of intravenous contrast. CONTRAST:  164mL OMNIPAQUE IOHEXOL 300 MG/ML  SOLN COMPARISON:  CT the chest, abdomen and pelvis 05/31/2018. FINDINGS: CT CHEST FINDINGS Cardiovascular: Heart size is normal. Small amount of pericardial fluid and/or thickening, increased compared to the prior study. This is unlikely to be of hemodynamic significance at this time. No associated  pericardial calcification. No atherosclerotic calcifications in the thoracic aorta or the  coronary arteries. 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 Mediastinum/Nodes: Multiple prominent borderline enlarged and mildly enlarged right hilar and mediastinal lymph nodes, largest of which is a low right paratracheal lymph node measuring 1.7 cm in short axis (axial image 28 of series 4). No left hilar lymphadenopathy. Esophagus is unremarkable in appearance. No axillary lymphadenopathy. Lungs/Pleura: When compared to the prior examination there is a new mass-like area in the central aspect of the left lower lobe (axial image 106 of series 5 and coronal image 102 of series 6) measuring 2.7 x 2.8 x 3.5 cm, with macrolobulated slightly spiculated margins. In the distal aspect of the left lower lobe beyond this lesion there is a wedge-shaped density (axial image 117 of series 5), favored to reflect some postobstructive atelectasis/consolidation. There are few new patchy areas of peribronchovascular ground-glass attenuation in the right middle lobe, favored to be infectious or inflammatory in etiology. Previously treated right upper lobe nodule is similar to prior examinations measuring 2.0 x 1.4 cm (axial image 32 of series 5). Other areas of nodularity noted elsewhere in the lungs appear stable compared to prior examinations, most notably a 1.0 x 0.5 cm nodule in the right upper lobe near the apex (axial image 37 of series 5). Diffuse bronchial wall thickening with mild centrilobular and paraseptal emphysema. No pleural effusions. Musculoskeletal: There are no aggressive appearing lytic or blastic lesions noted in the visualized portions of the skeleton. CT ABDOMEN PELVIS FINDINGS Hepatobiliary: No suspicious cystic or solid hepatic lesions. No intra or extrahepatic biliary ductal dilatation. Gallbladder is normal in appearance. Pancreas: No definite pancreatic mass or peripancreatic fluid  or inflammatory changes. Spleen: Unremarkable. Adrenals/Urinary Tract: Bilateral kidneys and adrenal glands are normal in appearance. No hydroureteronephrosis. Urinary bladder is normal in appearance. Stomach/Bowel: Normal appearance of the stomach. No pathologic dilatation of small bowel or colon. The appendix is not confidently identified and may be surgically absent. Regardless, there are no inflammatory changes noted adjacent to the cecum to suggest the presence of an acute appendicitis at this time. Vascular/Lymphatic: Aortic atherosclerosis, without evidence of aneurysm or dissection in the abdominal or pelvic vasculature. Numerous venous collaterals are noted throughout the anterior abdominal wall bilaterally (left greater than right). Right femoral central venous catheter with tip terminating in the intrahepatic portion of the inferior vena cava. No lymphadenopathy noted in the abdomen or pelvis. Reproductive: Prostate gland and seminal vesicles are unremarkable in appearance. Other: No significant volume of ascites.  No pneumoperitoneum. Musculoskeletal: Small sclerotic lesions in the sacrum bilaterally with narrow zones of transition, likely to represent bone islands, stable compared to prior studies measuring up to 8 mm in the left side of the sacrum. There are no aggressive appearing lytic or blastic lesions noted in the visualized portions of the skeleton. IMPRESSION: 1. New mass-like area in the central aspect of the left lower lobe with some associated postobstructive changes. Although this developed very rapidly, which is most characteristic of infection, the appearance of the main nidus of the lesion is highly concerning for neoplasm, either metastatic or a new primary bronchogenic neoplasm. Further clinical evaluation is recommended. 2. No signs of metastatic disease in the abdomen or pelvis. 3. New peribronchovascular ground-glass attenuation throughout the right middle lobe concerning for  infectious or inflammatory process. 4. 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 5. Additional incidental findings, as above. Electronically Signed   By: Vinnie Langton M.D.   On: 09/17/2018 16:48   Ct Angio Chest Pe W And/or Wo Contrast  Result Date: 09/25/2018 CLINICAL DATA:  History of stage III lung cancer. No chemo or radiation at this time. Left-sided chest pain and fever starting yesterday. EXAM: CT ANGIOGRAPHY CHEST WITH CONTRAST TECHNIQUE: Multidetector CT imaging of the chest was performed using the standard protocol during bolus administration of intravenous contrast. Multiplanar CT image reconstructions and MIPs were obtained to evaluate the vascular anatomy. CONTRAST:  76mL OMNIPAQUE IOHEXOL 350 MG/ML SOLN COMPARISON:  Chest x-ray Sep 25, 2018.  Chest CT September 17, 2018. FINDINGS: Cardiovascular: The heart is unchanged. Mild atherosclerotic changes are seen in the thoracic aorta without aneurysm or dissection. The known narrowing of the left brachiocephalic vein and severe narrowing versus occlusion of the superior vena cava are not well appreciated on today's study at it has there is no opacification on the left, possibly due to timing of contrast. The collaterals in the chest wall remain. No acute pulmonary emboli. Mediastinum/Nodes: The thyroid and esophagus are unremarkable. A right hilar node on series 4, image 45 measures 15 mm today versus 11 mm previously. The low right paratracheal node on series 4, image 38 measures 17 mm on both studies in short axis. A prevascular node on series 4, image 39 measures 10 mm in short axis today versus 6 mm June 29, 2018. No left hilar adenopathy. No other change in the mediastinal nodes. A small pericardial effusion is similar to mildly more prominent the interval. The effusion measures 12 mm in thickness on series 4, image 83 today versus 6 mm in thickness  previously. There is a small left pleural effusion which is new. Lungs/Pleura: The trachea and mainstem bronchi are patent. Left-sided central airways are normal. There is wall thickening associated with a right upper lobe branch as seen on axial image 39. There is soft tissue fullness in the right suprahilar region which is stable. There is short segment occlusion of a posterior right upper lobe branch of the bronchus. This is also stable. Narrowing of the right middle lobe bronchus as it arises from the bronchus intermedius is seen on series 6, image 54. Narrowing of a right lower lobe bronchus as it arises from the bronchus intermedius on image 56 is identified. There is associated thickening of the right bronchus intermedius. No pneumothorax. Emphysematous changes seen in the lungs. The right apically nodule on series 6, image 18 measures 10 by 5 mm today, unchanged. The site of the Alexander [name]'s primary malignancy in the medial right upper lobe measures 19 x 12 mm today, not significantly changed measuring 20 x 14 mm previously. The spiculated masslike opacity in the left lower lobe measures 2.7 by 2.5 cm today versus 2.8 x 2.7 cm previously, similar in the interval. This spiculated masslike opacity abuts a left lower lobe pulmonary artery. Just distal to this nodular density is a triangular region of opacification in the posterior left lower lobe, likely postobstructive change. Several tiny scattered nodules in the lungs are otherwise stable. No new nodules, masses, or infiltrates. Upper Abdomen: No acute abnormality. Musculoskeletal: No chest wall abnormality. No acute or significant osseous findings. Review of the MIP images confirms the above findings. IMPRESSION: 1. No pulmonary emboli identified. 2. The right apical nodule on series 6, image 18 is stable. The site of the known  malignancy in the medial right upper lobe is not significantly changed in the interval. 3. The spiculated masslike opacity in the left  lower lobe is similar in the interval with associated postobstructive changes. This is not changed since September 17, 2018. The lesion is highly concerning for neoplasm, either metastatic or a primary bronchogenic neoplasm. A focal pneumonia is possible but less likely given the stability. 4. Soft tissue fullness in the right suprahilar region is stable. Bronchial wall thickening and narrowing of some upper lobe bronchial branches is stable. 5. There is increased thickening of the bronchus intermedius and branching vessels with some narrowing of right middle and lower lobe bronchi. This is a new finding. 6. New left pleural effusion. The pericardial effusion is a little larger in the interval. 7. A prevascular node and right hilar node are little larger in the interval. Other nodes are similar in the interval. 8. 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 This is not as well assessed today due to timing of contrast and neither of these veins opacifies on today's study. Aortic Atherosclerosis (ICD10-I70.0) and Emphysema (ICD10-J43.9). Electronically Signed   By: Dorise Bullion III M.D   On: 09/25/2018 20:31   Ct Abdomen Pelvis W Contrast  Result Date: 09/17/2018 CLINICAL DATA:  53 year old male with history of right-sided lung cancer diagnosed last year. Follow-up study. EXAM: CT CHEST, ABDOMEN, AND PELVIS WITH CONTRAST TECHNIQUE: Multidetector CT imaging of the chest, abdomen and pelvis was performed following the standard protocol during bolus administration of intravenous contrast. CONTRAST:  111mL OMNIPAQUE IOHEXOL 300 MG/ML  SOLN COMPARISON:  CT the chest, abdomen and pelvis 05/31/2018. FINDINGS: CT CHEST FINDINGS Cardiovascular: Heart size is normal. Small amount of pericardial fluid and/or thickening, increased compared to the prior study. This is unlikely to be of hemodynamic significance at this time. No associated pericardial calcification. No  atherosclerotic calcifications in the thoracic aorta or the coronary arteries. 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 Mediastinum/Nodes: Multiple prominent borderline enlarged and mildly enlarged right hilar and mediastinal lymph nodes, largest of which is a low right paratracheal lymph node measuring 1.7 cm in short axis (axial image 28 of series 4). No left hilar lymphadenopathy. Esophagus is unremarkable in appearance. No axillary lymphadenopathy. Lungs/Pleura: When compared to the prior examination there is a new mass-like area in the central aspect of the left lower lobe (axial image 106 of series 5 and coronal image 102 of series 6) measuring 2.7 x 2.8 x 3.5 cm, with macrolobulated slightly spiculated margins. In the distal aspect of the left lower lobe beyond this lesion there is a wedge-shaped density (axial image 117 of series 5), favored to reflect some postobstructive atelectasis/consolidation. There are few new patchy areas of peribronchovascular ground-glass attenuation in the right middle lobe, favored to be infectious or inflammatory in etiology. Previously treated right upper lobe nodule is similar to prior examinations measuring 2.0 x 1.4 cm (axial image 32 of series 5). Other areas of nodularity noted elsewhere in the lungs appear stable compared to prior examinations, most notably a 1.0 x 0.5 cm nodule in the right upper lobe near the apex (axial image 37 of series 5). Diffuse bronchial wall thickening with mild centrilobular and paraseptal emphysema. No pleural effusions. Musculoskeletal: There are no aggressive appearing lytic or blastic lesions noted in the visualized portions of the skeleton. CT ABDOMEN PELVIS  FINDINGS Hepatobiliary: No suspicious cystic or solid hepatic lesions. No intra or extrahepatic biliary ductal dilatation. Gallbladder is normal in appearance.  Pancreas: No definite pancreatic mass or peripancreatic fluid or inflammatory changes. Spleen: Unremarkable. Adrenals/Urinary Tract: Bilateral kidneys and adrenal glands are normal in appearance. No hydroureteronephrosis. Urinary bladder is normal in appearance. Stomach/Bowel: Normal appearance of the stomach. No pathologic dilatation of small bowel or colon. The appendix is not confidently identified and may be surgically absent. Regardless, there are no inflammatory changes noted adjacent to the cecum to suggest the presence of an acute appendicitis at this time. Vascular/Lymphatic: Aortic atherosclerosis, without evidence of aneurysm or dissection in the abdominal or pelvic vasculature. Numerous venous collaterals are noted throughout the anterior abdominal wall bilaterally (left greater than right). Right femoral central venous catheter with tip terminating in the intrahepatic portion of the inferior vena cava. No lymphadenopathy noted in the abdomen or pelvis. Reproductive: Prostate gland and seminal vesicles are unremarkable in appearance. Other: No significant volume of ascites.  No pneumoperitoneum. Musculoskeletal: Small sclerotic lesions in the sacrum bilaterally with narrow zones of transition, likely to represent bone islands, stable compared to prior studies measuring up to 8 mm in the left side of the sacrum. There are no aggressive appearing lytic or blastic lesions noted in the visualized portions of the skeleton. IMPRESSION: 1. New mass-like area in the central aspect of the left lower lobe with some associated postobstructive changes. Although this developed very rapidly, which is most characteristic of infection, the appearance of the main nidus of the lesion is highly concerning for neoplasm, either metastatic or a new primary bronchogenic neoplasm. Further clinical evaluation is recommended. 2. No signs of metastatic disease in the abdomen or pelvis. 3. New peribronchovascular ground-glass  attenuation throughout the right middle lobe concerning for infectious or inflammatory process. 4. 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 5. Additional incidental findings, as above. Electronically Signed   By: Vinnie Langton M.D.   On: 09/17/2018 16:48   Dg Chest Portable 1 View  Result Date: 09/25/2018 CLINICAL DATA:  LEFT side chest pain and fever EXAM: PORTABLE CHEST 1 VIEW COMPARISON:  Portable exam 1830 hours compared to 07/22/2018 Correlation: CT chest 09/17/2018 FINDINGS: Normal heart size, mediastinal contours, and pulmonary vascularity. Lungs emphysematous with probable BILATERAL nipple shadows. No acute infiltrate, pleural effusion or pneumothorax. Bones unremarkable. IMPRESSION: Emphysematous changes with probable BILATERAL nipple shadows; no definite pulmonary nodules at these sites on recent CT. No acute infiltrate. Electronically Signed   By: Lavonia Dana M.D.   On: 09/25/2018 18:56       Today   Subjective:   Everardo All Alexander [name] doing much better denies any complaints o Objective:   Blood pressure 110/82, pulse (!) 109, temperature 98.5 F (36.9 C), temperature source Oral, resp. rate 17, height 5\' 9"  (1.753 m), weight 58.1 kg, SpO2 93 %.  .  Intake/Output Summary (Last 24 hours) at 09/27/2018 1000 Last data filed at 09/27/2018 0950 Gross per 24 hour  Intake 1103.52 ml  Output -  Net 1103.52 ml    Exam VITAL SIGNS: Blood pressure 110/82, pulse (!) 109, temperature 98.5 F (36.9 C), temperature source Oral, resp. rate 17, height 5\' 9"  (1.753 m), weight 58.1 kg, SpO2 93 %.  GENERAL:  53 y.o.-year-old Alexander [name] lying in the bed with no acute distress.  EYES: Pupils equal, round, reactive to light and accommodation. No scleral icterus. Extraocular muscles  intact.  HEENT: Head atraumatic, normocephalic. Oropharynx and nasopharynx clear.  NECK:  Supple, no jugular venous  distention. No thyroid enlargement, no tenderness.  LUNGS: Normal breath sounds bilaterally, no wheezing, rales,rhonchi or crepitation. No use of accessory muscles of respiration.  CARDIOVASCULAR: S1, S2 normal. No murmurs, rubs, or gallops.  ABDOMEN: Soft, nontender, nondistended. Bowel sounds present. No organomegaly or mass.  EXTREMITIES: No pedal edema, cyanosis, or clubbing.  NEUROLOGIC: Cranial nerves II through XII are intact. Muscle strength 5/5 in all extremities. Sensation intact. Gait not checked.  PSYCHIATRIC: The Alexander [name] is alert and oriented x 3.  SKIN: No obvious rash, lesion, or ulcer.   Data Review     CBC w Diff:  Lab Results  Component Value Date   WBC 11.9 (H) 09/27/2018   HGB 10.4 (L) 09/27/2018   HCT 32.7 (L) 09/27/2018   PLT 239 09/27/2018   LYMPHOPCT 7 09/25/2018   MONOPCT 9 09/25/2018   EOSPCT 0 09/25/2018   BASOPCT 0 09/25/2018   CMP:  Lab Results  Component Value Date   NA 140 09/27/2018   K 3.6 09/27/2018   CL 103 09/27/2018   CO2 29 09/27/2018   BUN 14 09/27/2018   CREATININE 1.09 09/27/2018   PROT 7.2 09/25/2018   ALBUMIN 3.4 (L) 09/25/2018   BILITOT 0.5 09/25/2018   ALKPHOS 84 09/25/2018   AST 15 09/25/2018   ALT 8 09/25/2018  .  Micro Results Recent Results (from the past 240 hour(s))  SARS Coronavirus 2 [facility] order, Performed in [facility] hospital lab)     Status: None   Collection Time: 09/25/18  6:50 PM  Result Value Ref Range Status   SARS Coronavirus 2 NEGATIVE NEGATIVE Final    Comment: (NOTE) If result is NEGATIVE SARS-CoV-2 target nucleic acids are NOT DETECTED. The SARS-CoV-2 RNA is generally detectable in upper and lower  respiratory specimens during the acute phase of infection. The lowest  concentration of SARS-CoV-2 viral copies this assay can detect is 250  copies / mL. A negative result does not preclude SARS-CoV-2 infection  and should not be used as the sole basis for treatment or other  Alexander [name] management  decisions.  A negative result may occur with  improper specimen collection / handling, submission of specimen other  than nasopharyngeal swab, presence of viral mutation(s) within the  areas targeted by this assay, and inadequate number of viral copies  (<250 copies / mL). A negative result must be combined with clinical  observations, Alexander [name] history, and epidemiological information. If result is POSITIVE SARS-CoV-2 target nucleic acids are DETECTED. The SARS-CoV-2 RNA is generally detectable in upper and lower  respiratory specimens dur ing the acute phase of infection.  Positive  results are indicative of active infection with SARS-CoV-2.  Clinical  correlation with Alexander [name] history and other diagnostic information is  necessary to determine Alexander [name] infection status.  Positive results do  not rule out bacterial infection or co-infection with other viruses. If result is PRESUMPTIVE POSTIVE SARS-CoV-2 nucleic acids MAY BE PRESENT.   A presumptive positive result was obtained on the submitted specimen  and confirmed on repeat testing.  While 2019 novel coronavirus  (SARS-CoV-2) nucleic acids may be present in the submitted sample  additional confirmatory testing may be necessary for epidemiological  and / or clinical management purposes  to differentiate between  SARS-CoV-2 and other Sarbecovirus currently known to infect humans.  If clinically indicated additional testing with an alternate test  methodology [phone]) is advised. The SARS-CoV-2  RNA is generally  detectable in upper and lower respiratory sp ecimens during the acute  phase of infection. The expected result is Negative. Fact Sheet for Patients:  StrictlyIdeas.no Fact Sheet for Healthcare Providers: BankingDealers.co.za This test is not yet approved or cleared by the [place] FDA and has been authorized for detection and/or diagnosis of SARS-CoV-2 by FDA under an  Emergency Use Authorization (EUA).  This EUA will remain in effect (meaning this test can be used) for the duration of the COVID-19 declaration under Section 564(b)(1) of the Act, 21 U.S.C. section 360bbb-3(b)(1), unless the authorization is terminated or revoked sooner. Performed at [facility] [address] [place], [place] [address]   Blood culture (routine x 2)     Status: None (Preliminary result)   Collection Time: 09/25/18  9:11 PM  Result Value Ref Range Status   Specimen Description BLOOD RIGHT ANTECUBITAL  Final   Special Requests   Final    BOTTLES DRAWN AEROBIC AND ANAEROBIC Blood Culture adequate volume   Culture   Final    NO GROWTH 2 DAYS Performed at [facility], [address] [place], [place] [address]    Report Status PENDING  Incomplete  Blood culture (routine x 2)     Status: None (Preliminary result)   Collection Time: 09/25/18  9:16 PM  Result Value Ref Range Status   Specimen Description BLOOD LEFT ANTECUBITAL  Final   Special Requests   Final    BOTTLES DRAWN AEROBIC AND ANAEROBIC Blood Culture adequate volume   Culture   Final    NO GROWTH 2 DAYS Performed at [facility], [address], [place], [place] [address]    Report Status PENDING  Incomplete  MRSA PCR Screening     Status: None   Collection Time: 09/26/18  6:31 AM  Result Value Ref Range Status   MRSA by PCR NEGATIVE NEGATIVE Final    Comment:        The GeneXpert MRSA Assay (FDA approved for NASAL specimens only), is one component of a comprehensive MRSA colonization surveillance program. It is not intended to diagnose MRSA infection nor to guide or monitor treatment for MRSA infections. Performed at [facility], [address] [place], [place] [address]   Expectorated sputum assessment w rflx to resp cult     Status: None   Collection Time: 09/27/18  1:58 AM  Result Value Ref Range Status   Specimen Description EXPECTORATED SPUTUM   Final   Special Requests Normal  Final   Sputum evaluation   Final    THIS SPECIMEN IS ACCEPTABLE FOR SPUTUM CULTURE Performed at [facility], [address] [place], [place] [address]    Report Status 09/27/2018 FINAL  Final        Code Status Orders  (From admission, onward)         Start     Ordered   09/25/18 2321  Full code  Continuous     09/25/18 2320        Code Status History    Date Active Date Inactive Code Status Order ID Comments User Context   06/30/2018 0421 07/03/2018 1452 Partial Code [id number]  Arnell Asal, NP Inpatient   06/30/2018 0236 06/30/2018 0421 Full Code [id number]  Frederik Pear, MD Inpatient   03/09/2018 1812 03/13/2018 1920 Full Code [id number]  Saundra Shelling, MD Inpatient    Advance Directive Documentation     Most Recent Value  Type of  Advance Directive  Living will  Pre-existing out of facility DNR order (yellow form or pink MOST form)  -  "MOST" Form in Place?  -          Follow-up Information    Sindy Guadeloupe, MD Follow up.   Specialty:  Oncology Why:  as scheduled Contact information: [place] [address] [phone]           Discharge Medications   Allergies as of 09/27/2018      Reactions   Doxycycline Swelling   Penicillins Swelling   Did it involve swelling of the face/tongue/throat, SOB, or low BP? Unknown Did it involve sudden or severe rash/hives, skin peeling, or any reaction on the inside of your mouth or nose? Unknown Did you need to seek medical attention at a hospital or doctor's office? Unknown When did it last happen?Childhood If all above answers are "NO", may proceed with cephalosporin use.      Medication List    TAKE these medications   apixaban 5 MG Tabs tablet Commonly known as:  ELIQUIS Take 1 tablet (5 mg total) by mouth 2 (two) times daily.   benzonatate 200 MG capsule Commonly known as:  TESSALON Take 1 capsule (200 mg total) by mouth 3  (three) times daily as needed for cough.   guaiFENesin-dextromethorphan 100-10 MG/5ML syrup Commonly known as:  ROBITUSSIN DM Take 5 mLs by mouth every 4 (four) hours as needed for cough.   Ipratropium-Albuterol 20-100 MCG/ACT Aers respimat Commonly known as:  Combivent Respimat Inhale 1 puff into the lungs every 6 (six) hours as needed for wheezing or shortness of breath.   levofloxacin 500 MG tablet Commonly known as:  Levaquin Take 1 tablet (500 mg total) by mouth daily for 5 days.   oxyCODONE-acetaminophen 5-325 MG tablet Commonly known as:  PERCOCET/ROXICET Take 1 tablet by mouth every 6 (six) hours as needed for severe pain.          Total Time in preparing paper work, data evaluation and todays exam - 66 minutes  Dustin Flock M.D on 09/27/2018 at 10:00 [place]  [phone]

## 2018-09-28 ENCOUNTER — Ambulatory Visit
Admit: 2018-09-28 | Discharge: 2018-09-28 | Disposition: A | Discharge status: Home / Self Care | Payer: Medicaid Other | Source: Ambulatory Visit | Attending: Oncology | Admitting: Oncology

## 2018-09-28 ENCOUNTER — Other Ambulatory Visit: Payer: Self-pay

## 2018-09-28 DIAGNOSIS — J439 Emphysema, unspecified: Secondary | ICD-10-CM | POA: Insufficient documentation

## 2018-09-28 DIAGNOSIS — C7951 Secondary malignant neoplasm of bone: Secondary | ICD-10-CM | POA: Insufficient documentation

## 2018-09-28 DIAGNOSIS — I700 Atherosclerosis of aorta: Secondary | ICD-10-CM | POA: Insufficient documentation

## 2018-09-28 DIAGNOSIS — J90 Pleural effusion, not elsewhere classified: Secondary | ICD-10-CM | POA: Diagnosis not present

## 2018-09-28 DIAGNOSIS — C3490 Malignant neoplasm of unspecified part of unspecified bronchus or lung: Secondary | ICD-10-CM | POA: Diagnosis present

## 2018-09-28 LAB — GLUCOSE, CAPILLARY: Glucose-Capillary: 102 mg/dL — ABNORMAL HIGH (ref 70–99)

## 2018-09-28 MED ORDER — FLUDEOXYGLUCOSE F - 18 (FDG) INJECTION
6.3700 | Freq: Once | INTRAVENOUS | Status: AC | PRN
  Administered 2018-09-28: 6.37 via INTRAVENOUS

## 2018-09-30 ENCOUNTER — Other Ambulatory Visit: Payer: Self-pay

## 2018-09-30 ENCOUNTER — Ambulatory Visit: Payer: Self-pay | Admitting: Oncology

## 2018-09-30 LAB — CULTURE, BLOOD (ROUTINE X 2)
Culture: NO GROWTH
Culture: NO GROWTH
Special Requests: ADEQUATE
Special Requests: ADEQUATE

## 2018-09-30 LAB — CULTURE, RESPIRATORY W GRAM STAIN
Culture: NORMAL
Special Requests: NORMAL

## 2018-10-01 ENCOUNTER — Encounter: Payer: Self-pay | Admitting: *Deleted

## 2018-10-01 ENCOUNTER — Inpatient Hospital Stay: Payer: Medicaid Other | Attending: Oncology | Admitting: Oncology

## 2018-10-01 ENCOUNTER — Encounter: Payer: Self-pay | Admitting: Oncology

## 2018-10-01 ENCOUNTER — Other Ambulatory Visit: Payer: Self-pay | Admitting: *Deleted

## 2018-10-01 DIAGNOSIS — R942 Abnormal results of pulmonary function studies: Secondary | ICD-10-CM | POA: Diagnosis not present

## 2018-10-01 DIAGNOSIS — Z7951 Long term (current) use of inhaled steroids: Secondary | ICD-10-CM | POA: Insufficient documentation

## 2018-10-01 DIAGNOSIS — C3411 Malignant neoplasm of upper lobe, right bronchus or lung: Secondary | ICD-10-CM

## 2018-10-01 DIAGNOSIS — J449 Chronic obstructive pulmonary disease, unspecified: Secondary | ICD-10-CM | POA: Insufficient documentation

## 2018-10-01 DIAGNOSIS — Z87891 Personal history of nicotine dependence: Secondary | ICD-10-CM

## 2018-10-01 DIAGNOSIS — C3490 Malignant neoplasm of unspecified part of unspecified bronchus or lung: Secondary | ICD-10-CM

## 2018-10-01 DIAGNOSIS — R918 Other nonspecific abnormal finding of lung field: Secondary | ICD-10-CM | POA: Insufficient documentation

## 2018-10-01 DIAGNOSIS — F17210 Nicotine dependence, cigarettes, uncomplicated: Secondary | ICD-10-CM | POA: Insufficient documentation

## 2018-10-01 DIAGNOSIS — R948 Abnormal results of function studies of other organs and systems: Secondary | ICD-10-CM

## 2018-10-01 DIAGNOSIS — M25551 Pain in right hip: Secondary | ICD-10-CM

## 2018-10-01 DIAGNOSIS — C7951 Secondary malignant neoplasm of bone: Secondary | ICD-10-CM

## 2018-10-01 DIAGNOSIS — Z79899 Other long term (current) drug therapy: Secondary | ICD-10-CM

## 2018-10-01 NOTE — Progress Notes (Signed)
Pt to get pet results today, he finished his last levaquin today. He is down to to smoking 4-5 cigarettes a day. He occ. Uses marijuana to help with appetite and nausea but hardly ever anymore since he has been off chemo

## 2018-10-01 NOTE — Progress Notes (Signed)
Omniseq request faxed to pathology with confirmation of receipt.

## 2018-10-04 ENCOUNTER — Ambulatory Visit: Payer: Self-pay | Admitting: Oncology

## 2018-10-04 ENCOUNTER — Encounter: Payer: Self-pay | Admitting: Oncology

## 2018-10-04 NOTE — Progress Notes (Signed)
I connected with Alexander [name] on 10/04/18 at 11:15 AM EDT by video enabled telemedicine visit and verified that I am speaking with the correct person using two identifiers.   I discussed the limitations, risks, security and privacy concerns of performing an evaluation and management service by telemedicine and the availability of in-person appointments. I also discussed with the patient that there may be a patient responsible charge related to this service. The patient expressed understanding and agreed to proceed.  Other persons participating in the visit and their role in the encounter:  none  Patient's location:  home Provider's location:  home  Chief Complaint: Discuss PET CT scan results and further management  Diagnosis-lung adenocarcinoma Stage IIIB cT3 cN2 cM0  History of present illness: patient is a 53 year old male with a long-standing history of smoking. He has smoked 1 to 1/2 pack of cigarettes per day for over 30 years. He presented to outside urgent care with some symptoms of shortness of breath and was treated for URI. He subsequently presented with facial and neck swelling which was thought by outside ER to be secondary to a drug reaction. Following that patient came to ER here at [facility].  He underwent CT chest abdomen and pelvis which showed 2.1 x 2 cm irregular mass in the right upper lobe concerning for malignancy.. Multiple collateral veins in chest and abdomen probable thrombosis of the left brachiocephalic vein. This is consistent with IVC syndrome Probable right paratracheal right hilar and precarinal adenopathy resulting in severe narrowing and thrombosis of SVC. This is consistent with SVC syndrome  Patient was seen by vascular surgery and there was no acute need for any endovascular therapies. He is on anticoagulation with Coumadin which has been subsequently changed to Eliquis as an outpatient. Patient initially underwent a bronchoscopy guided biopsy of the  mediastinal lymph nodes which was inconclusive and underwent a second IR guided biopsy of the right upper lobe lung mass which was consistent with adenocarcinoma  Patient started radiation treatment on 03/15/2018. Plan is to give 4 cycles of cisplatin and Alimta followed by maintenance durvalumab. Cycle 1 of cisplatin Alimta given on 03/16/2018.Patient completed 4 cycles of cisplatin and Alimta along with concurrent radiation on 05/20/2018. Scan showed partial response. Cycle 1 of durvalumab given on 06/17/2018. Patient presented to the ER with hypotension tachycardia and was found to have pericardial effusion 10 days after first dose of durvalumab. Fluid negative for malignancy  Patient was empirically treated for pericardial effusion probably secondary to immunotherapy versus radiation and completed 1 month of steroids. Repeat echocardiogram was normal. PDL 1 testing on his initial tumor specimen showed expression of less than 1%.   Interval history reports pain in his right hip.  He has been using the Percocet that he got from the emergency department.  Has fatigue.  Denies any other complaints   Review of Systems  Constitutional: Positive for malaise/fatigue. Negative for chills, fever and weight loss.  HENT: Negative for congestion, ear discharge and nosebleeds.   Eyes: Negative for blurred vision.  Respiratory: Negative for cough, hemoptysis, sputum production, shortness of breath and wheezing.   Cardiovascular: Negative for chest pain, palpitations, orthopnea and claudication.  Gastrointestinal: Negative for abdominal pain, blood in stool, constipation, diarrhea, heartburn, melena, nausea and vomiting.  Genitourinary: Negative for dysuria, flank pain, frequency, hematuria and urgency.  Musculoskeletal: Positive for joint pain (Right hip pain). Negative for back pain and myalgias.  Skin: Negative for rash.  Neurological: Negative for dizziness, tingling, focal weakness,  seizures,  weakness and headaches.  Endo/Heme/Allergies: Does not bruise/bleed easily.  Psychiatric/Behavioral: Negative for depression and suicidal ideas. The patient does not have insomnia.     Allergies  Allergen Reactions  . Doxycycline Swelling  . Penicillins Swelling    Did it involve swelling of the face/tongue/throat, SOB, or low BP? Unknown Did it involve sudden or severe rash/hives, skin peeling, or any reaction on the inside of your mouth or nose? Unknown Did you need to seek medical attention at a hospital or doctor's office? Unknown When did it last happen?Childhood If [name] above answers are "NO", may proceed with cephalosporin use.    Past Medical History:  Diagnosis Date  . Cancer ([place])   . COPD (chronic obstructive pulmonary disease) ([place])   . Lung mass     Past Surgical History:  Procedure Laterality Date  . ENDOBRONCHIAL ULTRASOUND N/A 03/11/2018   Procedure: ENDOBRONCHIAL ULTRASOUND;  Surgeon: Tyler Pita, MD;  Location: [facility];  Service: Cardiopulmonary;  Laterality: N/A;  . none    . PORTA CATH INSERTION N/A 03/12/2018   Procedure: PORTA CATH INSERTION, thigh;  Surgeon: Katha Cabal, MD;  Location: [facility];  Service: Cardiovascular;  Laterality: N/A;  . SUBXYPHOID PERICARDIAL WINDOW N/A 06/30/2018   Procedure: SUBXYPHOID PERICARDIAL WINDOW;  Surgeon: [name], MD;  Location: [place];  Service: Open Heart Surgery;  Laterality: N/A;  . TEE WITHOUT CARDIOVERSION N/A 06/30/2018   Procedure: TRANSESOPHAGEAL ECHOCARDIOGRAM (TEE);  Surgeon: [name], MD;  Location: [place];  Service: Open Heart Surgery;  Laterality: N/A;    Social History   Socioeconomic History  . Marital status: Divorced    Spouse name: Not on file  . Number of children: Not on file  . Years of education: Not on file  . Highest education level: Not on file  Occupational History  . Occupation: Cabin crew  Social Needs  . Financial resource  strain: Not on file  . Food insecurity:    Worry: Not on file    Inability: Not on file  . Transportation needs:    Medical: Not on file    Non-medical: Not on file  Tobacco Use  . Smoking status: Current Every Day Smoker    Packs/day: 0.25  . Smokeless tobacco: Never Used  . Tobacco comment: 4 cig a day  Substance and Sexual Activity  . Alcohol use: Not Currently  . Drug use: Yes    Types: Marijuana  . Sexual activity: Not Currently  Lifestyle  . Physical activity:    Days per week: Not on file    Minutes per session: Not on file  . Stress: Not on file  Relationships  . Social connections:    Talks on phone: Not on file    Gets together: Not on file    Attends religious service: Not on file    Active member of club or organization: Not on file    Attends meetings of clubs or organizations: Not on file    Relationship status: Not on file  . Intimate partner violence:    Fear of current or ex partner: Not on file    Emotionally abused: Not on file    Physically abused: Not on file    Forced sexual activity: Not on file  Other Topics Concern  . Not on file  Social History Narrative  . Not on file    History reviewed. No pertinent family history.   Current Outpatient Medications:  .  apixaban (  ELIQUIS) 5 MG TABS tablet, Take 1 tablet (5 mg total) by mouth 2 (two) times daily., Disp: 60 tablet, Rfl: 3 .  benzonatate (TESSALON) 200 MG capsule, Take 1 capsule (200 mg total) by mouth 3 (three) times daily as needed for cough., Disp: 20 capsule, Rfl: 0 .  guaiFENesin-dextromethorphan (ROBITUSSIN DM) 100-10 MG/5ML syrup, Take 5 mLs by mouth every 4 (four) hours as needed for cough., Disp: 118 mL, Rfl: 0 .  Ipratropium-Albuterol (COMBIVENT RESPIMAT) 20-100 MCG/ACT AERS respimat, Inhale 1 puff into the lungs every 6 (six) hours as needed for wheezing or shortness of breath., Disp: 1 Inhaler, Rfl: 2 .  oxyCODONE-acetaminophen (PERCOCET/ROXICET) 5-325 MG tablet, Take 1 tablet by  mouth every 6 (six) hours as needed for severe pain., Disp: 30 tablet, Rfl: 0  Ct Soft Tissue Neck W Contrast  Result Date: 09/17/2018 CLINICAL DATA:  Lung cancer. Restaging. Current every day smoker. Pressure sensation in neck. Completion of radiation and chemotherapy. EXAM: CT NECK WITH CONTRAST TECHNIQUE: Multidetector CT imaging of the neck was performed using the standard protocol following the bolus administration of intravenous contrast. CONTRAST:  117mL OMNIPAQUE IOHEXOL 300 MG/ML  SOLN COMPARISON:  PET scan 03/18/2018. FINDINGS: Pharynx and larynx: Asymmetric fullness versus coaptation LEFT vallecula. Difficult to exclude lingual tonsillar enlargement on the LEFT. Elsewhere asymmetric fullness LEFT oropharynx. If further investigation desired, consider direct inspection, or repeat PET scan. See image 55 series 17, and series 13, image 55. Normal larynx. Normal nasopharynx. Salivary glands: No inflammation, mass, or stone. Thyroid: Normal. Lymph nodes: None enlarged or abnormal density. Vascular: Negative. Limited intracranial: Negative. Visualized orbits: Negative. Mastoids and visualized paranasal sinuses: Clear. Skeleton: No acute or aggressive process. Upper chest: Reported separately. Other: None. IMPRESSION: 1. Asymmetric fullness of the LEFT oropharynx. Correlate clinically for tonsillitis or tonsillar mass. Asymmetry of the LEFT vallecula. Consider direct inspection. 2. No pathologic adenopathy. Electronically Signed   By: Staci Righter M.D.   On: 09/17/2018 16:32   Ct Chest W Contrast  Result Date: 09/17/2018 CLINICAL DATA:  53 year old male with history of right-sided lung cancer diagnosed last year. Follow-up study. EXAM: CT CHEST, ABDOMEN, AND PELVIS WITH CONTRAST TECHNIQUE: Multidetector CT imaging of the chest, abdomen and pelvis was performed following the standard protocol during bolus administration of intravenous contrast. CONTRAST:  113mL OMNIPAQUE IOHEXOL 300 MG/ML  SOLN  COMPARISON:  CT the chest, abdomen and pelvis 05/31/2018. FINDINGS: CT CHEST FINDINGS Cardiovascular: Heart size is normal. Small amount of pericardial fluid and/or thickening, increased compared to the prior study. This is unlikely to be of hemodynamic significance at this time. No associated pericardial calcification. No atherosclerotic calcifications in the thoracic aorta or the coronary arteries. 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 Mediastinum/Nodes: Multiple prominent borderline enlarged and mildly enlarged right hilar and mediastinal lymph nodes, largest of which is a low right paratracheal lymph node measuring 1.7 cm in short axis (axial image 28 of series 4). No left hilar lymphadenopathy. Esophagus is unremarkable in appearance. No axillary lymphadenopathy. Lungs/Pleura: When compared to the prior examination there is a new mass-like area in the central aspect of the left lower lobe (axial image 106 of series 5 and coronal image 102 of series 6) measuring 2.7 x 2.8 x 3.5 cm, with macrolobulated slightly spiculated margins. In the distal aspect of the left lower lobe beyond this lesion there is a wedge-shaped density (axial image 117 of series  5), favored to reflect some postobstructive atelectasis/consolidation. There are few new patchy areas of peribronchovascular ground-glass attenuation in the right middle lobe, favored to be infectious or inflammatory in etiology. Previously treated right upper lobe nodule is similar to prior examinations measuring 2.0 x 1.4 cm (axial image 32 of series 5). Other areas of nodularity noted elsewhere in the lungs appear stable compared to prior examinations, most notably a 1.0 x 0.5 cm nodule in the right upper lobe near the apex (axial image 37 of series 5). Diffuse bronchial wall thickening with mild centrilobular and paraseptal emphysema. No  pleural effusions. Musculoskeletal: There are no aggressive appearing lytic or blastic lesions noted in the visualized portions of the skeleton. CT ABDOMEN PELVIS FINDINGS Hepatobiliary: No suspicious cystic or solid hepatic lesions. No intra or extrahepatic biliary ductal dilatation. Gallbladder is normal in appearance. Pancreas: No definite pancreatic mass or peripancreatic fluid or inflammatory changes. Spleen: Unremarkable. Adrenals/Urinary Tract: Bilateral kidneys and adrenal glands are normal in appearance. No hydroureteronephrosis. Urinary bladder is normal in appearance. Stomach/Bowel: Normal appearance of the stomach. No pathologic dilatation of small bowel or colon. The appendix is not confidently identified and may be surgically absent. Regardless, there are no inflammatory changes noted adjacent to the cecum to suggest the presence of an acute appendicitis at this time. Vascular/Lymphatic: Aortic atherosclerosis, without evidence of aneurysm or dissection in the abdominal or pelvic vasculature. Numerous venous collaterals are noted throughout the anterior abdominal wall bilaterally (left greater than right). Right femoral central venous catheter with tip terminating in the intrahepatic portion of the inferior vena cava. No lymphadenopathy noted in the abdomen or pelvis. Reproductive: Prostate gland and seminal vesicles are unremarkable in appearance. Other: No significant volume of ascites.  No pneumoperitoneum. Musculoskeletal: Small sclerotic lesions in the sacrum bilaterally with narrow zones of transition, likely to represent bone islands, stable compared to prior studies measuring up to 8 mm in the left side of the sacrum. There are no aggressive appearing lytic or blastic lesions noted in the visualized portions of the skeleton. IMPRESSION: 1. New mass-like area in the central aspect of the left lower lobe with some associated postobstructive changes. Although this developed very rapidly, which is  most characteristic of infection, the appearance of the main nidus of the lesion is highly concerning for neoplasm, either metastatic or a new primary bronchogenic neoplasm. Further clinical evaluation is recommended. 2. No signs of metastatic disease in the abdomen or pelvis. 3. New peribronchovascular ground-glass attenuation throughout the right middle lobe concerning for infectious or inflammatory process. 4. 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 5. Additional incidental findings, as above. Electronically Signed   By: Vinnie Langton M.D.   On: 09/17/2018 16:48   Ct Angio Chest Pe W And/or Wo Contrast  Result Date: 09/25/2018 CLINICAL DATA:  History of stage III lung cancer. No chemo or radiation at this time. Left-sided chest pain and fever starting yesterday. EXAM: CT ANGIOGRAPHY CHEST WITH CONTRAST TECHNIQUE: Multidetector CT imaging of the chest was performed using the standard protocol during bolus administration of intravenous contrast. Multiplanar CT image reconstructions and MIPs were obtained to evaluate the vascular anatomy. CONTRAST:  42mL OMNIPAQUE IOHEXOL 350 MG/ML SOLN COMPARISON:  Chest x-ray Sep 25, 2018.  Chest CT September 17, 2018. FINDINGS: Cardiovascular: The heart is unchanged. Mild atherosclerotic changes are seen in the thoracic aorta without aneurysm or dissection. The known narrowing of the left brachiocephalic vein  and severe narrowing versus occlusion of the superior vena cava are not well appreciated on today's study at it has there is no opacification on the left, possibly due to timing of contrast. The collaterals in the chest wall remain. No acute pulmonary emboli. Mediastinum/Nodes: The thyroid and esophagus are unremarkable. A right hilar node on series 4, image 45 measures 15 mm today versus 11 mm previously. The low right paratracheal node on series 4, image 38 measures 17 mm  on both studies in short axis. A prevascular node on series 4, image 39 measures 10 mm in short axis today versus 6 mm June 29, 2018. No left hilar adenopathy. No other change in the mediastinal nodes. A small pericardial effusion is similar to mildly more prominent the interval. The effusion measures 12 mm in thickness on series 4, image 83 today versus 6 mm in thickness previously. There is a small left pleural effusion which is new. Lungs/Pleura: The trachea and mainstem bronchi are patent. Left-sided central airways are normal. There is wall thickening associated with a right upper lobe branch as seen on axial image 39. There is soft tissue fullness in the right suprahilar region which is stable. There is short segment occlusion of a posterior right upper lobe branch of the bronchus. This is also stable. Narrowing of the right middle lobe bronchus as it arises from the bronchus intermedius is seen on series 6, image 54. Narrowing of a right lower lobe bronchus as it arises from the bronchus intermedius on image 56 is identified. There is associated thickening of the right bronchus intermedius. No pneumothorax. Emphysematous changes seen in the lungs. The right apically nodule on series 6, image 18 measures 10 by 5 mm today, unchanged. The site of the patient's primary malignancy in the medial right upper lobe measures 19 x 12 mm today, not significantly changed measuring 20 x 14 mm previously. The spiculated masslike opacity in the left lower lobe measures 2.7 by 2.5 cm today versus 2.8 x 2.7 cm previously, similar in the interval. This spiculated masslike opacity abuts a left lower lobe pulmonary artery. Just distal to this nodular density is a triangular region of opacification in the posterior left lower lobe, likely postobstructive change. Several tiny scattered nodules in the lungs are otherwise stable. No new nodules, masses, or infiltrates. Upper Abdomen: No acute abnormality. Musculoskeletal: No chest  wall abnormality. No acute or significant osseous findings. Review of the MIP images confirms the above findings. IMPRESSION: 1. No pulmonary emboli identified. 2. The right apical nodule on series 6, image 18 is stable. The site of the known malignancy in the medial right upper lobe is not significantly changed in the interval. 3. The spiculated masslike opacity in the left lower lobe is similar in the interval with associated postobstructive changes. This is not changed since September 17, 2018. The lesion is highly concerning for neoplasm, either metastatic or a primary bronchogenic neoplasm. A focal pneumonia is possible but less likely given the stability. 4. Soft tissue fullness in the right suprahilar region is stable. Bronchial wall thickening and narrowing of some upper lobe bronchial branches is stable. 5. There is increased thickening of the bronchus intermedius and branching vessels with some narrowing of right middle and lower lobe bronchi. This is a new finding. 6. New left pleural effusion. The pericardial effusion is a little larger in the interval. 7. A prevascular node and right hilar node are little larger in the interval. Other nodes are similar in the interval. 8.  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 This is not as well assessed today due to timing of contrast and neither of these veins opacifies on today's study. Aortic Atherosclerosis (ICD10-I70.0) and Emphysema (ICD10-J43.9). Electronically Signed   By: Dorise Bullion III M.D   On: 09/25/2018 20:31   Ct Abdomen Pelvis W Contrast  Result Date: 09/17/2018 CLINICAL DATA:  53 year old male with history of right-sided lung cancer diagnosed last year. Follow-up study. EXAM: CT CHEST, ABDOMEN, AND PELVIS WITH CONTRAST TECHNIQUE: Multidetector CT imaging of the chest, abdomen and pelvis was performed following the standard protocol during bolus administration of intravenous contrast.  CONTRAST:  137mL OMNIPAQUE IOHEXOL 300 MG/ML  SOLN COMPARISON:  CT the chest, abdomen and pelvis 05/31/2018. FINDINGS: CT CHEST FINDINGS Cardiovascular: Heart size is normal. Small amount of pericardial fluid and/or thickening, increased compared to the prior study. This is unlikely to be of hemodynamic significance at this time. No associated pericardial calcification. No atherosclerotic calcifications in the thoracic aorta or the coronary arteries. 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 Mediastinum/Nodes: Multiple prominent borderline enlarged and mildly enlarged right hilar and mediastinal lymph nodes, largest of which is a low right paratracheal lymph node measuring 1.7 cm in short axis (axial image 28 of series 4). No left hilar lymphadenopathy. Esophagus is unremarkable in appearance. No axillary lymphadenopathy. Lungs/Pleura: When compared to the prior examination there is a new mass-like area in the central aspect of the left lower lobe (axial image 106 of series 5 and coronal image 102 of series 6) measuring 2.7 x 2.8 x 3.5 cm, with macrolobulated slightly spiculated margins. In the distal aspect of the left lower lobe beyond this lesion there is a wedge-shaped density (axial image 117 of series 5), favored to reflect some postobstructive atelectasis/consolidation. There are few new patchy areas of peribronchovascular ground-glass attenuation in the right middle lobe, favored to be infectious or inflammatory in etiology. Previously treated right upper lobe nodule is similar to prior examinations measuring 2.0 x 1.4 cm (axial image 32 of series 5). Other areas of nodularity noted elsewhere in the lungs appear stable compared to prior examinations, most notably a 1.0 x 0.5 cm nodule in the right upper lobe near the apex (axial image 37 of series 5). Diffuse bronchial wall thickening  with mild centrilobular and paraseptal emphysema. No pleural effusions. Musculoskeletal: There are no aggressive appearing lytic or blastic lesions noted in the visualized portions of the skeleton. CT ABDOMEN PELVIS FINDINGS Hepatobiliary: No suspicious cystic or solid hepatic lesions. No intra or extrahepatic biliary ductal dilatation. Gallbladder is normal in appearance. Pancreas: No definite pancreatic mass or peripancreatic fluid or inflammatory changes. Spleen: Unremarkable. Adrenals/Urinary Tract: Bilateral kidneys and adrenal glands are normal in appearance. No hydroureteronephrosis. Urinary bladder is normal in appearance. Stomach/Bowel: Normal appearance of the stomach. No pathologic dilatation of small bowel or colon. The appendix is not confidently identified and may be surgically absent. Regardless, there are no inflammatory changes noted adjacent to the cecum to suggest the presence of an acute appendicitis at this time. Vascular/Lymphatic: Aortic atherosclerosis, without evidence of aneurysm or dissection in the abdominal or pelvic vasculature. Numerous venous collaterals are noted throughout the anterior abdominal wall bilaterally (left greater than right). Right femoral central venous catheter with tip terminating in the intrahepatic portion of the inferior vena cava. No lymphadenopathy noted in the abdomen or  pelvis. Reproductive: Prostate gland and seminal vesicles are unremarkable in appearance. Other: No significant volume of ascites.  No pneumoperitoneum. Musculoskeletal: Small sclerotic lesions in the sacrum bilaterally with narrow zones of transition, likely to represent bone islands, stable compared to prior studies measuring up to 8 mm in the left side of the sacrum. There are no aggressive appearing lytic or blastic lesions noted in the visualized portions of the skeleton. IMPRESSION: 1. New mass-like area in the central aspect of the left lower lobe with some associated postobstructive  changes. Although this developed very rapidly, which is most characteristic of infection, the appearance of the main nidus of the lesion is highly concerning for neoplasm, either metastatic or a new primary bronchogenic neoplasm. Further clinical evaluation is recommended. 2. No signs of metastatic disease in the abdomen or pelvis. 3. New peribronchovascular ground-glass attenuation throughout the right middle lobe concerning for infectious or inflammatory process. 4. 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 5. Additional incidental findings, as above. Electronically Signed   By: Vinnie Langton M.D.   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 EXAM: NUCLEAR MEDICINE PET SKULL BASE TO THIGH TECHNIQUE: 6.4 mCi F-18 FDG was injected intravenously. Full-ring PET imaging was performed from the skull base to thigh after the radiotracer. CT data was obtained and used for attenuation correction and anatomic localization. Fasting blood glucose: 102 mg/dl COMPARISON:  09/25/2018 chest CT. 09/17/2018 CT chest, abdomen and pelvis. 03/18/2018 PET-CT. FINDINGS: Mediastinal blood pool activity: SUV max 1.9 NECK: No enlarged or hypermetabolic neck lymph nodes. Incidental CT findings: none CHEST: Spiculated hypermetabolic 2.9 x 2.6 cm central left lower lobe solid pulmonary nodule with max SUV 7.9, new from 03/18/2018 PET-CT, mildly increased in size from 2.7 x 2.4 cm on recent 09/17/2018 chest CT. Less intense hypermetabolism (max SUV 4.0) within a peripheral wedge-shaped focus of consolidation in the  posterior left lower lobe, which is similar in size since 09/17/2018 chest CT and new since 06/29/2018 chest CT. No additional hypermetabolic pulmonary findings. Medial apical right upper lobe 1.1 cm irregular solid pulmonary nodule demonstrates no residual hypermetabolism (max SUV 1.5) and is stable in size from recent chest CT studies. Peripheral apical right upper lobe 1.0 cm pulmonary nodule demonstrates no hypermetabolism (max SUV 1.5) and is stable from recent chest CT studies. No enlarged or hypermetabolic axillary, mediastinal or right hilar lymph nodes. No residual hypermetabolism within right paratracheal and right hilar nodes. Mildly hypermetabolic left infrahilar node with max SUV 2.7, new from prior PET-CT. Incidental CT findings: Small dependent left pleural effusion, stable from recent chest CT. Stable small pericardial effusion. Atherosclerotic nonaneurysmal thoracic aorta. Moderate centrilobular emphysema. ABDOMEN/PELVIS: No abnormal hypermetabolic activity within the liver, pancreas, adrenal glands, or spleen. No hypermetabolic lymph nodes in the abdomen or pelvis. Incidental CT findings: Trace free fluid in the pelvis. Atherosclerotic nonaneurysmal abdominal aorta. Right common femoral Port-A-Cath terminates in the infrahepatic IVC. SKELETON: New hypermetabolic lytic 1.5 cm right acetabular lesion with max SUV 6.6 (series 3/image 236). No additional hypermetabolic skeletal lesions. Incidental CT findings: none IMPRESSION: 1. New hypermetabolic lytic right acetabular bone metastasis. 2. Hypermetabolic (max SUV 7.9) spiculated 2.9 cm  central left lower lobe pulmonary nodule, mildly increased in size since recent 09/17/2018 chest CT, new from studies previous to 09/17/2018, most compatible with malignancy, probably a contralateral metastasis, although the differential includes a metachronous primary bronchogenic carcinoma. 3. Less intense hypermetabolism associated with a wedge-shaped focus of  consolidation in the posterior left lower lobe, favor small focus of postobstructive pneumonia. 4. New mildly hypermetabolic left infrahilar lymph node, likely a nodal metastasis. 5. Small dependent left pleural effusion. 6. No residual/recurrent hypermetabolism within the treated medial apical right upper lobe pulmonary lesion or within the treated right hilar and right paratracheal nodes. 7. Aortic Atherosclerosis (ICD10-I70.0) and Emphysema (ICD10-J43.9). Electronically Signed   By: Ilona Sorrel M.D.   On: 09/28/2018 16:53   Dg Chest Portable 1 View  Result Date: 09/25/2018 CLINICAL DATA:  LEFT side chest pain and fever EXAM: PORTABLE CHEST 1 VIEW COMPARISON:  Portable exam 1830 hours compared to 07/22/2018 Correlation: CT chest 09/17/2018 FINDINGS: Normal heart size, mediastinal contours, and pulmonary vascularity. Lungs emphysematous with probable BILATERAL nipple shadows. No acute infiltrate, pleural effusion or pneumothorax. Bones unremarkable. IMPRESSION: Emphysematous changes with probable BILATERAL nipple shadows; no definite pulmonary nodules at these sites on recent CT. No acute infiltrate. Electronically Signed   By: Lavonia Dana M.D.   On: 09/25/2018 18:56    No images are attached to the encounter.   CMP Latest Ref Rng & Units 09/27/2018  Glucose 70 - 99 mg/dL 101(H)  BUN 6 - 20 mg/dL 14  Creatinine 0.61 - 1.24 mg/dL 1.09  Sodium 135 - 145 mmol/L 140  Potassium 3.5 - 5.1 mmol/L 3.6  Chloride 98 - 111 mmol/L 103  CO2 22 - 32 mmol/L 29  Calcium 8.9 - 10.3 mg/dL 8.7(L)  Total Protein 6.5 - 8.1 g/dL -  Total Bilirubin 0.3 - 1.2 mg/dL -  Alkaline Phos 38 - 126 U/L -  AST 15 - 41 U/L -  ALT 0 - 44 U/L -   CBC Latest Ref Rng & Units 09/27/2018  WBC 4.0 - 10.5 K/uL 11.9(H)  Hemoglobin 13.0 - 17.0 g/dL 10.4(L)  Hematocrit 39.0 - 52.0 % 32.7(L)  Platelets 150 - 400 K/uL 239     Observation/objective: Appears in no acute distress along with you as of today.  Breathing is  nonlabored  Assessment and plan: Patient is a 53 year old male with a history of stage IIIb likely lung adenocarcinoma T3 N2 M0 presenting as SVC syndrome.  New status.  Concurrent chemoradiation with 4 cycles of cisplatin and Alimta.  He developed pericardial effusion after 1 cycle of durvalumab and therefore was not rechallenged.  CT chest showed progressive disease and he is here to discuss the results of PET CT scan  I have reviewed PET/CT scan images independently which shows a new spiculated 2.9 cm hypermetabolic left lower lobe pulmonary nodule which is concerning for progressive disease.  Also has a hypermetabolic uptake in the left infrahilar lymph node.  No residual hypermetabolism in the right upper lobe pulmonary lesion are right-sided lymph nodes.  New hypermetabolic lytic and right acetabular bone metastases.  I have discussed this case at tumor board and plan is for him to see Dr. Patsey Berthold for a repeat bronchoscopy guided biopsy of the left lower lobe.  We will also attempt to send off Omniseq testing from the initial specimen if sufficient material is available.  If it is not available we will have to wait for repeat bronchoscopy.  Also discussed the findings of lytic lesion noted in  his right acetabulum.  Patient does report pain in his right hip.  I will get plain x-rays of his right hip including the femur and refer him to radiation oncology for palliative radiation.  Discussed the role for bisphosphonate for bone metastases but he would need dental clearance prior to that  Follow-up instructions: I will tentatively see him back on 525 or 526 with labs CBC, CMP to start second line docetaxel and possible Xgeva depending on the results of bronchoscopy  I discussed the assessment and treatment plan with the patient. The patient was provided an opportunity to ask questions and [name] were answered. The patient agreed with the plan and demonstrated an understanding of the instructions.   The  patient was advised to call back or seek an in-person evaluation if the symptoms worsen or if the condition fails to improve as anticipated.  I provided 30 minutes of face-to-face video visit time during this encounter, and > 50% was spent counseling as documented under my assessment & plan.  Visit Diagnosis: 1. Malignant neoplasm of upper lobe of right lung ([place])   2. Bone metastases ([place])   3. Abnormal PET of left lung     Dr. Randa Evens, MD, MPH [facility] [phone] 10/04/2018 8:40 AM

## 2018-10-06 ENCOUNTER — Encounter: Payer: Self-pay | Admitting: Pulmonary Disease

## 2018-10-06 ENCOUNTER — Other Ambulatory Visit: Payer: Self-pay

## 2018-10-06 ENCOUNTER — Ambulatory Visit
Admission: RE | Admit: 2018-10-06 | Discharge: 2018-10-06 | Disposition: A | Discharge status: Home / Self Care | Payer: Medicaid Other | Source: Home / Self Care | Attending: Oncology | Admitting: Oncology

## 2018-10-06 ENCOUNTER — Ambulatory Visit
Admission: RE | Admit: 2018-10-06 | Discharge: 2018-10-06 | Disposition: A | Discharge status: Home / Self Care | Payer: Medicaid Other | Source: Ambulatory Visit | Attending: Radiation Oncology | Admitting: Radiation Oncology

## 2018-10-06 ENCOUNTER — Ambulatory Visit
Admission: RE | Admit: 2018-10-06 | Discharge: 2018-10-06 | Disposition: A | Discharge status: Home / Self Care | Payer: Medicaid Other | Source: Ambulatory Visit | Attending: Oncology | Admitting: Oncology

## 2018-10-06 ENCOUNTER — Telehealth: Payer: Self-pay

## 2018-10-06 ENCOUNTER — Ambulatory Visit (INDEPENDENT_AMBULATORY_CARE_PROVIDER_SITE_OTHER): Payer: Medicaid Other | Admitting: Pulmonary Disease

## 2018-10-06 ENCOUNTER — Encounter: Payer: Self-pay | Admitting: Radiation Oncology

## 2018-10-06 ENCOUNTER — Other Ambulatory Visit: Payer: Self-pay | Admitting: *Deleted

## 2018-10-06 ENCOUNTER — Telehealth: Payer: Self-pay | Admitting: *Deleted

## 2018-10-06 VITALS — BP 120/70 | HR 112 | Ht 69.0 in | Wt 131.6 lb

## 2018-10-06 VITALS — BP 121/80 | HR 120 | Temp 96.9°F | Resp 20 | Wt 130.6 lb

## 2018-10-06 DIAGNOSIS — Z923 Personal history of irradiation: Secondary | ICD-10-CM | POA: Diagnosis not present

## 2018-10-06 DIAGNOSIS — R948 Abnormal results of function studies of other organs and systems: Secondary | ICD-10-CM | POA: Insufficient documentation

## 2018-10-06 DIAGNOSIS — C3490 Malignant neoplasm of unspecified part of unspecified bronchus or lung: Secondary | ICD-10-CM

## 2018-10-06 DIAGNOSIS — R599 Enlarged lymph nodes, unspecified: Secondary | ICD-10-CM | POA: Diagnosis not present

## 2018-10-06 DIAGNOSIS — Z9221 Personal history of antineoplastic chemotherapy: Secondary | ICD-10-CM | POA: Diagnosis not present

## 2018-10-06 DIAGNOSIS — C7951 Secondary malignant neoplasm of bone: Secondary | ICD-10-CM | POA: Diagnosis not present

## 2018-10-06 DIAGNOSIS — M25551 Pain in right hip: Secondary | ICD-10-CM | POA: Insufficient documentation

## 2018-10-06 DIAGNOSIS — G893 Neoplasm related pain (acute) (chronic): Secondary | ICD-10-CM | POA: Diagnosis not present

## 2018-10-06 DIAGNOSIS — C3411 Malignant neoplasm of upper lobe, right bronchus or lung: Secondary | ICD-10-CM | POA: Insufficient documentation

## 2018-10-06 DIAGNOSIS — C3491 Malignant neoplasm of unspecified part of right bronchus or lung: Secondary | ICD-10-CM

## 2018-10-06 DIAGNOSIS — R918 Other nonspecific abnormal finding of lung field: Secondary | ICD-10-CM | POA: Diagnosis not present

## 2018-10-06 DIAGNOSIS — J449 Chronic obstructive pulmonary disease, unspecified: Secondary | ICD-10-CM

## 2018-10-06 DIAGNOSIS — I871 Compression of vein: Secondary | ICD-10-CM

## 2018-10-06 MED ORDER — OXYCODONE HCL 10 MG PO TABS: 10.0000 mg | ORAL_TABLET | ORAL | 0 refills | Status: DC | PRN

## 2018-10-06 NOTE — Progress Notes (Signed)
Subjective:    Patient ID: Alexander [name], male    DOB: [birth year], 53 y.o.   MRN: [id number]  HPI The patient is a 53 year old current smoker, whom I previously evaluated in October 2019 when he was noted to have SVC syndrome and associated right lung mass.  The patient underwent endobronchial ultrasound with TBNA on 17 October NT 19, however, because of the engorgement of the vascular anatomy from SVC syndrome the samples where obscured by significant blood.  He subsequently underwent needle biopsy of the right upper lobe mass which revealed adenocarcinoma.  He has since then had concurrent chemoradiation, sequently had his case complicated by a pericardial effusion that appeared to be related to chemotherapeutic side effects/inflammation.  He had pericardial window performed in February 2020.  Subsequently on the earlier parts of May he was admitted to [facility] with a bout of pneumonia and sepsis.  Work-up at that time revealed that it appeared he had a postobstructive pneumonia behind a left lower lobe spiculated masslike opacity. This is concerning for neoplasm.  It is also noted that the patient has thickening of the bronchus intermedius and narrowing of the right middle and lower lobe bronchi which is a new finding.  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  I have reviewed the study independently.  I have also shown the patient the films.  The patient underwent PET/CT on 5 May which showed that the area in question on the left lower lobe has intense FDG uptake there is also a mildly hypermetabolic left infrahilar lymph node.  It is also noted that he has a hypermetabolic lytic right acetabular bone diastasis.  Patient saw Dr. Baruch Gouty today for evaluation for palliative radiation to this area.  Patient is referred to [place] again for biopsy of the left lower lobe lesion.  The issue here being whether this represents metastatic disease versus a  second metachronous lesion.  The patient has only complaints of fatigue.  He has had some right lower extremity pain on weightbearing associated with his bony metastasis.  Other than this he does not endorse any cough, sputum production or hemoptysis.  He has not had any chest tightness.  He has not had any dyspnea over his baseline.  He feels that the Combivent is helping him with his dyspnea.  He has not had any fevers, chills or sweats.  No lower extremity edema.  No orthopnea or PND.  He is still smoking 4 to 5 cigarettes/day.  Deviously he has smoked up to 2 packs of cigarettes per day for 30 years.  He is on anticoagulation for chronic SVC syndrome.  He notes that he has had to stop anticoagulation for several days for procedures previously, has not had difficulties doing this.  I have reviewed his past medical history, social history and family history.  Review of Systems  Constitutional: Positive for fatigue.  HENT: Negative.   Eyes: Negative.   Respiratory: Positive for shortness of breath.   Cardiovascular: Negative.   Gastrointestinal: Negative.   Endocrine: Negative.   Genitourinary: Negative.   Musculoskeletal: Positive for gait problem (Difficulty weightbearing on right).  Allergic/Immunologic: Negative.   Hematological: Bruises/bleeds easily.       On anticoagulation.  All other systems reviewed and are negative.      Objective:   Physical Exam Vitals signs and nursing note reviewed.  Constitutional:      General: He is not in acute distress.  Appearance: He is well-groomed and underweight. He is ill-appearing (Chronically). He is not toxic-appearing.  HENT:     Head: Normocephalic and atraumatic.     Nose: Nose normal.     Comments: Limited exam due to need for masking during COVID-19.  External structures appear normal. Eyes:     General: No scleral icterus.    Extraocular Movements: Extraocular movements intact.     Conjunctiva/sclera: Conjunctivae normal.       Pupils: Pupils are equal, round, and reactive to light.  Neck:     Musculoskeletal: Neck supple.     Thyroid: No thyromegaly.     Trachea: Trachea and phonation normal.     Comments: Prominent neck veins Cardiovascular:     Rate and Rhythm: Normal rate and regular rhythm.     Pulses: Normal pulses.     Heart sounds: Normal heart sounds.  Pulmonary:     Effort: Pulmonary effort is normal.     Breath sounds: Normal breath sounds.  Abdominal:     General: Abdomen is flat. There is no distension.     Palpations: Abdomen is soft.  Musculoskeletal: Normal range of motion.     Right lower leg: No edema.     Left lower leg: No edema.  Lymphadenopathy:     Cervical: No cervical adenopathy.  Skin:    General: Skin is warm and dry.  Neurological:     General: No focal deficit present.     Mental Status: He is alert and oriented to person, place, and time.  Psychiatric:        Mood and Affect: Mood normal.        Behavior: Behavior normal.           Assessment & Plan:   1.  Spiculated mass on the left lower lobe with FDG avidity: Carcinoma until proven otherwise.  Patient has prior history of adenocarcinoma of the lung noted by right hilar mass, right upper lobe mass and SVC syndrome.  The new lesion could represent metastatic disease versus a metachronous tumor.  Biopsy to better address his oncologic care is requested.  The patient had several biopsy options discussed with him.  In particular bronchoscopy with navigational assistance.  The patient understands the potential benefits, limitations and complications of the procedure.  He agrees to proceed with the same.  We will schedule him for bronchoscopy with navigational assistance, the procedure will also include endobronchial ultrasound to evaluate the left hilar and right hilar adenopathy as well.  The patient has undergone bronchoscopy with endobronchial ultrasound previously and he is aware that he will require general  anesthesia for the procedure.  He will need to hold Eliquis for 3 days prior to the procedure.  Tentatively scheduling the procedure for 20 May we will confirm with the patient.  He will need super dimension CT scan to develop a three-dimensional bronchoscopic map for navigational guidance.  2.  Adenocarcinoma of the right lung: He is status post concurrent chemoradiation.  No findings suggesting potential metastatic disease versus secondary latency with metastasis have been identified as above.  3.  SVC syndrome: She is currently on anticoagulation namely Eliquis.  He understands that this will have to be held 3 days prior to the procedure.  The patient has had to do this previously without any ill effect.  4.  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  We will treat the patient with DuoNeb prior to the procedure.  5.  The patient  will undergo COVID-19 retesting prior to the procedure.   I discussed all of the above with Dr. Janese Banks.

## 2018-10-06 NOTE — Telephone Encounter (Signed)
Super D CT scan, 10/11/18 @ 10:00 [facility] - [facility] Bronchoscopy EBUS (for left hilar adenopathy) and ENB (Left lower lobe lesion) CPT: [place], [id number], [phone]

## 2018-10-06 NOTE — Progress Notes (Signed)
MacroRadiation Oncology Follow up Note  Name: Alexander [name]   Date:   10/06/2018 MRN:  [id number] DOB: Sep 18, 1965    This 53 y.o. male presents to the clinic today for evaluation of metastatic disease to right acetabulum and patient previous stage IIIb adenocarcinoma of the right lung.  REFERRING PROVIDER: No ref. provider found  HPI: Patient is a 53 year old male well-known to department having completed concurrent chemoradiation therapy for stage IIIb (T3 N2 M0) adenocarcinoma of the right lung.  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  He has had significant response in the right upper lobe.  Recent repeat PET CT scan demonstrated hypermetabolic activity in the right acetabulum consistent with metastatic disease and the patient has significant pain on ambulation in that region.  Patient also demonstrated new hypermetabolic activity in left infrahilar lymph node as well as left lower lobe.  Patient is also being scheduled for bronchoscopy for the left lower lobe lesion.  He is seen today for consideration of palliative radiation therapy to his right acetabulum.  He is doing well specifically denies cough hemoptysis or chest tightness.  He is having some weightbearing problems on the right lower extremity.  COMPLICATIONS OF TREATMENT: none  FOLLOW UP COMPLIANCE: keeps appointments   PHYSICAL EXAM:  BP 121/80   Pulse (!) 120   Temp (!) 96.9 F (36.1 C)   Resp 20   Wt 130 lb 10 oz (59.2 kg)   BMI 19.29 kg/m  Range of motion right lower extremity does elicit some pain.  Motor sensory and DTR levels are equal and symmetric in the upper lower extremities.  Well-developed well-nourished patient in NAD. HEENT reveals PERLA, EOMI, discs not visualized.  Oral cavity is clear. No oral mucosal lesions are identified.  Neck is clear without evidence of cervical or supraclavicular adenopathy. Lungs are clear to A&P. Cardiac examination is essentially unremarkable with regular rate and rhythm without murmur rub or thrill. Abdomen is benign with no organomegaly or masses noted. Motor sensory and DTR levels are equal and symmetric in the upper and lower extremities. Cranial nerves II through XII are grossly intact. Proprioception is intact. No peripheral adenopathy or edema is identified. No motor or sensory levels are noted. Crude visual fields are within normal range.  RADIOLOGY RESULTS: PET scan is reviewed and compatible with above-stated findings  PLAN: At this time is go ahead with a course of palliative radiation therapy to his right acetabular region.  Would plan on delivering 3000 cGy in 10 fractions.  Hypofractionated course of treatment does not seem to work as well in weightbearing areas such as the right hip.  I am also concerned about the left lower lobe and left hilar hypermetabolic lesion.  I believe I can complete a course of radiation therapy in 10 fractions to this area.  We will discussed the case with Dr. Janese Banks.  Patient is being scheduled for bronchoscopy to determine if this is indeed a new primary or metastatic disease.  Risks and benefits of radiation to his right hip were reviewed.  Patient comprehends my recommendations and treatment plan well.  I would like to take this opportunity to thank you for allowing me to participate in the care of your patient.Noreene Filbert, MD

## 2018-10-06 NOTE — Patient Instructions (Signed)
Plan for bronchoscopy.

## 2018-10-06 NOTE — Telephone Encounter (Signed)
Pt called and will be out of med this afternoon. The pain in hip has been bad and he had to take 2 pills at a time to try and control pain.  I spoke with Janese Banks and she will give him 10 mg oxycodone every 4 hours and 120 quantity. I have sent it in and Janese Banks will cosign and send to pharmacy. I called patient and let him know we are changing the med from 5 mg to 10 mg since he is taking 2 at a time. Then he can have it every 4 hours and it is without tylenol. He is agreeable to the above and I told him it will be 1:30 before the med will go to pharmacy and he is ok with that

## 2018-10-06 NOTE — H&P (View-Only) (Signed)
Subjective:    Patient ID: Alexander [name], male    DOB: January 04, 1966, 53 y.o.   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  The patient underwent endobronchial ultrasound with TBNA on 17 October NT 19, however, because of the engorgement of the vascular anatomy from SVC syndrome the samples where obscured by significant blood.  He subsequently underwent needle biopsy of the right upper lobe mass which revealed adenocarcinoma.  He has since then had concurrent chemoradiation, sequently had his case complicated by a pericardial effusion that appeared to be related to chemotherapeutic side effects/inflammation.  He had pericardial window performed in February 2020.  Subsequently on the earlier parts of May he was admitted to [facility] with a bout of pneumonia and sepsis.  Work-up at that time revealed that it appeared he had a postobstructive pneumonia behind a left lower lobe spiculated masslike opacity. This is concerning for neoplasm.  It is also noted that the patient has thickening of the bronchus intermedius and narrowing of the right middle and lower lobe bronchi which is a new finding.  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  I have reviewed the study independently.  I have also shown the patient the films.  The patient underwent PET/CT on 5 May which showed that the area in question on the left lower lobe has intense FDG uptake there is also a mildly hypermetabolic left infrahilar lymph node.  It is also noted that he has a hypermetabolic lytic right acetabular bone diastasis.  Patient saw Dr. Baruch Gouty today for evaluation for palliative radiation to this area.  Patient is referred to [place] again for biopsy of the left lower lobe lesion.  The issue here being whether this represents metastatic disease versus a  second metachronous lesion.  The patient has only complaints of fatigue.  He has had some right lower extremity pain on weightbearing associated with his bony metastasis.  Other than this he does not endorse any cough, sputum production or hemoptysis.  He has not had any chest tightness.  He has not had any dyspnea over his baseline.  He feels that the Combivent is helping him with his dyspnea.  He has not had any fevers, chills or sweats.  No lower extremity edema.  No orthopnea or PND.  He is still smoking 4 to 5 cigarettes/day.  Deviously he has smoked up to 2 packs of cigarettes per day for 30 years.  He is on anticoagulation for chronic SVC syndrome.  He notes that he has had to stop anticoagulation for several days for procedures previously, has not had difficulties doing this.  I have reviewed his past medical history, social history and family history.  Review of Systems  Constitutional: Positive for fatigue.  HENT: Negative.   Eyes: Negative.   Respiratory: Positive for shortness of breath.   Cardiovascular: Negative.   Gastrointestinal: Negative.   Endocrine: Negative.   Genitourinary: Negative.   Musculoskeletal: Positive for gait problem (Difficulty weightbearing on right).  Allergic/Immunologic: Negative.   Hematological: Bruises/bleeds easily.       On anticoagulation.  All other systems reviewed and are negative.      Objective:   Physical Exam Vitals signs and nursing note reviewed.  Constitutional:      General: He is not in acute distress.  Appearance: He is well-groomed and underweight. He is ill-appearing (Chronically). He is not toxic-appearing.  HENT:     Head: Normocephalic and atraumatic.     Nose: Nose normal.     Comments: Limited exam due to need for masking during COVID-19.  External structures appear normal. Eyes:     General: No scleral icterus.    Extraocular Movements: Extraocular movements intact.     Conjunctiva/sclera: Conjunctivae normal.       Pupils: Pupils are equal, round, and reactive to light.  Neck:     Musculoskeletal: Neck supple.     Thyroid: No thyromegaly.     Trachea: Trachea and phonation normal.     Comments: Prominent neck veins Cardiovascular:     Rate and Rhythm: Normal rate and regular rhythm.     Pulses: Normal pulses.     Heart sounds: Normal heart sounds.  Pulmonary:     Effort: Pulmonary effort is normal.     Breath sounds: Normal breath sounds.  Abdominal:     General: Abdomen is flat. There is no distension.     Palpations: Abdomen is soft.  Musculoskeletal: Normal range of motion.     Right lower leg: No edema.     Left lower leg: No edema.  Lymphadenopathy:     Cervical: No cervical adenopathy.  Skin:    General: Skin is warm and dry.  Neurological:     General: No focal deficit present.     Mental Status: He is alert and oriented to person, place, and time.  Psychiatric:        Mood and Affect: Mood normal.        Behavior: Behavior normal.           Assessment & Plan:   1.  Spiculated mass on the left lower lobe with FDG avidity: Carcinoma until proven otherwise.  Patient has prior history of adenocarcinoma of the lung noted by right hilar mass, right upper lobe mass and SVC syndrome.  The new lesion could represent metastatic disease versus a metachronous tumor.  Biopsy to better address his oncologic care is requested.  The patient had several biopsy options discussed with him.  In particular bronchoscopy with navigational assistance.  The patient understands the potential benefits, limitations and complications of the procedure.  He agrees to proceed with the same.  We will schedule him for bronchoscopy with navigational assistance, the procedure will also include endobronchial ultrasound to evaluate the left hilar and right hilar adenopathy as well.  The patient has undergone bronchoscopy with endobronchial ultrasound previously and he is aware that he will require general  anesthesia for the procedure.  He will need to hold Eliquis for 3 days prior to the procedure.  Tentatively scheduling the procedure for 20 May we will confirm with the patient.  He will need super dimension CT scan to develop a three-dimensional bronchoscopic map for navigational guidance.  2.  Adenocarcinoma of the right lung: He is status post concurrent chemoradiation.  No findings suggesting potential metastatic disease versus secondary latency with metastasis have been identified as above.  3.  SVC syndrome: She is currently on anticoagulation namely Eliquis.  He understands that this will have to be held 3 days prior to the procedure.  The patient has had to do this previously without any ill effect.  4.  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  We will treat the patient with DuoNeb prior to the procedure.  5.  The patient  will undergo COVID-19 retesting prior to the procedure.   I discussed all of the above with Dr. Janese Banks.

## 2018-10-07 ENCOUNTER — Other Ambulatory Visit: Payer: Self-pay

## 2018-10-07 ENCOUNTER — Ambulatory Visit
Admission: RE | Admit: 2018-10-07 | Discharge: 2018-10-07 | Disposition: A | Discharge status: Home / Self Care | Payer: Medicaid Other | Source: Ambulatory Visit | Attending: Radiation Oncology | Admitting: Radiation Oncology

## 2018-10-07 DIAGNOSIS — Z510 Encounter for antineoplastic radiation therapy: Secondary | ICD-10-CM | POA: Diagnosis present

## 2018-10-07 DIAGNOSIS — C3491 Malignant neoplasm of unspecified part of right bronchus or lung: Secondary | ICD-10-CM | POA: Diagnosis not present

## 2018-10-07 NOTE — Telephone Encounter (Signed)
Pt has Medicaid pending. Not approved yet, no other insurance at the present time. Pt considered self pay at this time.  No PA required for [id number], [id number], [phone] for self pay procedures. Rhonda J Cobb

## 2018-10-08 ENCOUNTER — Encounter
Admission: RE | Admit: 2018-10-08 | Discharge: 2018-10-08 | Disposition: A | Discharge status: Home / Self Care | Payer: Medicaid Other | Source: Ambulatory Visit | Attending: Pulmonary Disease | Admitting: Pulmonary Disease

## 2018-10-08 ENCOUNTER — Other Ambulatory Visit: Payer: Self-pay

## 2018-10-08 DIAGNOSIS — Z1159 Encounter for screening for other viral diseases: Secondary | ICD-10-CM | POA: Insufficient documentation

## 2018-10-08 DIAGNOSIS — Z510 Encounter for antineoplastic radiation therapy: Secondary | ICD-10-CM | POA: Diagnosis not present

## 2018-10-08 NOTE — Patient Instructions (Addendum)
Your procedure is scheduled on: 10/13/2018 Wed Report to Same Day Surgery 2nd floor medical mall [facility] Entrance-take elevator on left to 2nd floor.  Check in with surgery information desk.)  Call [phone] between 1-3pm on 5/192020 Tues  Remember: Instructions that are not followed completely may result in serious medical risk, up to and including death, or upon the discretion of your surgeon and anesthesiologist your surgery may need to be rescheduled.    _x___ 1. Do not eat food after midnight the night before your procedure. You may drink clear liquids up to 2 hours before you are scheduled to arrive at the hospital for your procedure.  Do not drink clear liquids within 2 hours of your scheduled arrival to the hospital.  Clear liquids include  --Water or Apple juice without pulp  --Clear carbohydrate beverage such as ClearFast or Gatorade  --Black Coffee or Clear Tea (No milk, no creamers, do not add anything to                  the coffee or Tea Type 1 and type 2 diabetics should only drink water.   ____Ensure clear carbohydrate drink on the way to the hospital for bariatric patients  ____Ensure clear carbohydrate drink 3 hours before surgery for Dr Dwyane Luo patients if physician instructed.   No gum chewing or hard candies.     __x__ 2. No Alcohol for 24 hours before or after surgery.   __x__3. No Smoking or e-cigarettes for 24 prior to surgery.  Do not use any chewable tobacco products for at least 6 hour prior to surgery   ____  4. Bring all medications with you on the day of surgery if instructed.    __x__ 5. Notify your doctor if there is any change in your medical condition     (cold, fever, infections).    x___6. On the morning of surgery brush your teeth with toothpaste and water.  You may rinse your mouth with mouth wash if you wish.  Do not swallow any toothpaste or mouthwash.   Do not wear jewelry, make-up, hairpins, clips or nail polish.  Do not wear  lotions, powders, or perfumes. You may wear deodorant.  Do not shave 48 hours prior to surgery. Men may shave face and neck.  Do not bring valuables to the hospital.    [facility] is not responsible for any belongings or valuables.               Contacts, dentures or bridgework may not be worn into surgery.  Leave your suitcase in the car. After surgery it may be brought to your room.  For patients admitted to the hospital, discharge time is determined by your                       treatment team.  _  Patients discharged the day of surgery will not be allowed to drive home.  You will need someone to drive you home and stay with you the night of your procedure.    Please read over the following fact sheets that you were given:   [facility] Preparing for Surgery and or MRSA Information   _x___ Take anti-hypertensive listed below, cardiac, seizure, asthma,     anti-reflux and psychiatric medicines. These include:  1. Ipratropium-Albuterol (COMBIVENT RESPIMAT) 20-100 MCG/ACT AERS respimat  2.Oxycodone HCl 10 MG TABS if needed  3.  4.  5.  6.  ____Fleets enema or Magnesium  Citrate as directed.   _x___ Use CHG Soap or sage wipes as directed on instruction sheet   _x___ Use inhalers on the day of surgery and bring to hospital day of surgery  ____ Stop Metformin and Janumet 2 days prior to surgery.    ____ Take 1/2 of usual insulin dose the night before surgery and none on the morning     surgery.   _x___ Follow recommendations from Cardiologist, Pulmonologist or PCP regarding          stopping Aspirin, Coumadin, Plavix ,Eliquis, Effient, or Pradaxa, and Pletal. To stop Eliquis on Sunday per MD instruction.  X____Stop Anti-inflammatories such as Advil, Aleve, Ibuprofen, Motrin, Naproxen, Naprosyn, Goodies powders or aspirin products. OK to take Tylenol and                          Celebrex.   _x___ Stop supplements until after surgery.  But may continue Vitamin D, Vitamin B,       and  multivitamin.   ____ Bring C-Pap to the hospital.

## 2018-10-10 LAB — NOVEL CORONAVIRUS, NAA (HOSP ORDER, SEND-OUT TO REF LAB; TAT 18-24 HRS): SARS-CoV-2, NAA: NOT DETECTED

## 2018-10-11 ENCOUNTER — Other Ambulatory Visit: Payer: Self-pay

## 2018-10-11 ENCOUNTER — Ambulatory Visit
Admission: RE | Admit: 2018-10-11 | Discharge: 2018-10-11 | Disposition: A | Discharge status: Home / Self Care | Payer: Medicaid Other | Source: Ambulatory Visit | Attending: Pulmonary Disease | Admitting: Pulmonary Disease

## 2018-10-11 DIAGNOSIS — R918 Other nonspecific abnormal finding of lung field: Secondary | ICD-10-CM | POA: Diagnosis present

## 2018-10-13 ENCOUNTER — Other Ambulatory Visit: Payer: Self-pay | Admitting: *Deleted

## 2018-10-13 ENCOUNTER — Ambulatory Visit
Admission: RE | Admit: 2018-10-13 | Discharge: 2018-10-13 | Disposition: A | Discharge status: Home / Self Care | Payer: Medicaid Other | Attending: Pulmonary Disease | Admitting: Pulmonary Disease

## 2018-10-13 ENCOUNTER — Ambulatory Visit: Payer: Medicaid Other

## 2018-10-13 ENCOUNTER — Other Ambulatory Visit: Payer: Self-pay

## 2018-10-13 ENCOUNTER — Encounter
Admission: RE | Disposition: A | Discharge status: Home / Self Care | Payer: Self-pay | Source: Home / Self Care | Attending: Pulmonary Disease

## 2018-10-13 ENCOUNTER — Ambulatory Visit: Payer: Medicaid Other | Admitting: Anesthesiology

## 2018-10-13 ENCOUNTER — Encounter: Payer: Self-pay | Admitting: Anesthesiology

## 2018-10-13 DIAGNOSIS — F17210 Nicotine dependence, cigarettes, uncomplicated: Secondary | ICD-10-CM | POA: Insufficient documentation

## 2018-10-13 DIAGNOSIS — Z85118 Personal history of other malignant neoplasm of bronchus and lung: Secondary | ICD-10-CM | POA: Diagnosis not present

## 2018-10-13 DIAGNOSIS — I871 Compression of vein: Secondary | ICD-10-CM | POA: Insufficient documentation

## 2018-10-13 DIAGNOSIS — Z7901 Long term (current) use of anticoagulants: Secondary | ICD-10-CM | POA: Insufficient documentation

## 2018-10-13 DIAGNOSIS — J449 Chronic obstructive pulmonary disease, unspecified: Secondary | ICD-10-CM | POA: Diagnosis not present

## 2018-10-13 DIAGNOSIS — J984 Other disorders of lung: Secondary | ICD-10-CM | POA: Insufficient documentation

## 2018-10-13 DIAGNOSIS — R918 Other nonspecific abnormal finding of lung field: Secondary | ICD-10-CM | POA: Diagnosis not present

## 2018-10-13 DIAGNOSIS — Z9889 Other specified postprocedural states: Secondary | ICD-10-CM

## 2018-10-13 DIAGNOSIS — Z79899 Other long term (current) drug therapy: Secondary | ICD-10-CM | POA: Diagnosis not present

## 2018-10-13 DIAGNOSIS — R590 Localized enlarged lymph nodes: Secondary | ICD-10-CM | POA: Insufficient documentation

## 2018-10-13 DIAGNOSIS — I82210 Acute embolism and thrombosis of superior vena cava: Secondary | ICD-10-CM

## 2018-10-13 DIAGNOSIS — Z923 Personal history of irradiation: Secondary | ICD-10-CM | POA: Insufficient documentation

## 2018-10-13 DIAGNOSIS — Z9221 Personal history of antineoplastic chemotherapy: Secondary | ICD-10-CM | POA: Diagnosis not present

## 2018-10-13 HISTORY — PX: ENDOBRONCHIAL ULTRASOUND: SHX5096

## 2018-10-13 HISTORY — PX: ELECTROMAGNETIC NAVIGATION BROCHOSCOPY: SHX5369

## 2018-10-13 SURGERY — ELECTROMAGNETIC NAVIGATION BRONCHOSCOPY
Anesthesia: General | Laterality: Left

## 2018-10-13 MED ORDER — ONDANSETRON HCL 4 MG/2ML IJ SOLN: 4.0000 mg | Freq: Once | INTRAMUSCULAR | Status: DC | PRN

## 2018-10-13 MED ORDER — FENTANYL CITRATE (PF) 100 MCG/2ML IJ SOLN
INTRAMUSCULAR | Status: DC | PRN
  Administered 2018-10-13 (×2): 25 ug via INTRAVENOUS
  Administered 2018-10-13: 50 ug via INTRAVENOUS

## 2018-10-13 MED ORDER — LACTATED RINGERS IV SOLN
INTRAVENOUS | Status: DC
  Administered 2018-10-13: 12:00:00 via INTRAVENOUS

## 2018-10-13 MED ORDER — FENTANYL CITRATE (PF) 100 MCG/2ML IJ SOLN
INTRAMUSCULAR | Status: AC
  Filled 2018-10-13: qty 2

## 2018-10-13 MED ORDER — IPRATROPIUM-ALBUTEROL 0.5-2.5 (3) MG/3ML IN SOLN
3.0000 mL | RESPIRATORY_TRACT | Status: DC
  Administered 2018-10-13: 16:00:00 3 mL via RESPIRATORY_TRACT

## 2018-10-13 MED ORDER — IPRATROPIUM-ALBUTEROL 0.5-2.5 (3) MG/3ML IN SOLN: 3.0000 mL | Freq: Once | RESPIRATORY_TRACT | Status: DC

## 2018-10-13 MED ORDER — BUTAMBEN-TETRACAINE-BENZOCAINE 2-2-14 % EX AERO
1.0000 | INHALATION_SPRAY | Freq: Once | CUTANEOUS | Status: DC
  Filled 2018-10-13: qty 20

## 2018-10-13 MED ORDER — PROPOFOL 10 MG/ML IV BOLUS
INTRAVENOUS | Status: DC | PRN
  Administered 2018-10-13: 180 mg via INTRAVENOUS

## 2018-10-13 MED ORDER — PROPOFOL 10 MG/ML IV BOLUS
INTRAVENOUS | Status: AC
  Filled 2018-10-13: qty 20

## 2018-10-13 MED ORDER — FAMOTIDINE 20 MG PO TABS
ORAL_TABLET | ORAL | Status: AC
  Filled 2018-10-13: qty 1

## 2018-10-13 MED ORDER — FAMOTIDINE 20 MG PO TABS
20.0000 mg | ORAL_TABLET | Freq: Once | ORAL | Status: AC
  Administered 2018-10-13: 20 mg via ORAL

## 2018-10-13 MED ORDER — MIDAZOLAM HCL 2 MG/2ML IJ SOLN
INTRAMUSCULAR | Status: AC
  Filled 2018-10-13: qty 2

## 2018-10-13 MED ORDER — APIXABAN 5 MG PO TABS: 5.0000 mg | ORAL_TABLET | Freq: Two times a day (BID) | ORAL | 3 refills | Status: DC

## 2018-10-13 MED ORDER — SODIUM CHLORIDE 0.9 % IV SOLN: Freq: Once | INTRAVENOUS | Status: DC

## 2018-10-13 MED ORDER — LIDOCAINE HCL (CARDIAC) PF 100 MG/5ML IV SOSY
PREFILLED_SYRINGE | INTRAVENOUS | Status: DC | PRN
  Administered 2018-10-13: 100 mg via INTRAVENOUS

## 2018-10-13 MED ORDER — MIDAZOLAM HCL 2 MG/2ML IJ SOLN
INTRAMUSCULAR | Status: DC | PRN
  Administered 2018-10-13: 1 mg via INTRAVENOUS

## 2018-10-13 MED ORDER — DEXAMETHASONE SODIUM PHOSPHATE 10 MG/ML IJ SOLN
INTRAMUSCULAR | Status: DC | PRN
  Administered 2018-10-13: 10 mg via INTRAVENOUS

## 2018-10-13 MED ORDER — IPRATROPIUM-ALBUTEROL 0.5-2.5 (3) MG/3ML IN SOLN
RESPIRATORY_TRACT | Status: AC
  Filled 2018-10-13: qty 3

## 2018-10-13 MED ORDER — FENTANYL CITRATE (PF) 100 MCG/2ML IJ SOLN: 25.0000 ug | INTRAMUSCULAR | Status: DC | PRN

## 2018-10-13 MED ORDER — SUGAMMADEX SODIUM 200 MG/2ML IV SOLN
INTRAVENOUS | Status: DC | PRN
  Administered 2018-10-13: 200 mg via INTRAVENOUS

## 2018-10-13 MED ORDER — ROCURONIUM BROMIDE 100 MG/10ML IV SOLN
INTRAVENOUS | Status: DC | PRN
  Administered 2018-10-13: 10 mg via INTRAVENOUS
  Administered 2018-10-13 (×2): 20 mg via INTRAVENOUS

## 2018-10-13 MED ORDER — SUCCINYLCHOLINE CHLORIDE 20 MG/ML IJ SOLN
INTRAMUSCULAR | Status: DC | PRN
  Administered 2018-10-13: 140 mg via INTRAVENOUS

## 2018-10-13 MED ORDER — PHENYLEPHRINE HCL (PRESSORS) 10 MG/ML IV SOLN
INTRAVENOUS | Status: DC | PRN
  Administered 2018-10-13: 100 ug via INTRAVENOUS
  Administered 2018-10-13 (×2): 200 ug via INTRAVENOUS

## 2018-10-13 NOTE — Anesthesia Post-op Follow-up Note (Signed)
Anesthesia QCDR form completed.        

## 2018-10-13 NOTE — Anesthesia Preprocedure Evaluation (Addendum)
Anesthesia Evaluation  Patient identified by MRN, date of birth, ID band Patient awake    Reviewed: Allergy & Precautions, NPO status , Patient's Chart, lab work & pertinent test results  History of Anesthesia Complications Negative for: history of anesthetic complications  Airway Mallampati: II       Dental   Pulmonary neg sleep apnea, COPD,  COPD inhaler, Current Smoker,    Pulmonary exam normal        Cardiovascular (-) hypertension(-) Past MI and (-) CHF Normal cardiovascular exam(-) dysrhythmias (-) Valvular Problems/Murmurs  Hx of SVC syndrome with SVC clot.... Hx of pericardial effusion   Neuro/Psych neg Seizures negative neurological ROS  negative psych ROS   GI/Hepatic Neg liver ROS, neg GERD  ,  Endo/Other  negative endocrine ROSneg diabetes  Renal/GU negative Renal ROS  negative genitourinary   Musculoskeletal   Abdominal Normal abdominal exam  (+)   Peds negative pediatric ROS (+)  Hematology negative hematology ROS (+)   Anesthesia Other Findings Past Medical History: No date: Cancer ([place]) No date: COPD (chronic obstructive pulmonary disease) ([facility]) No date: Lung mass  Reproductive/Obstetrics                            Anesthesia Physical  Anesthesia Plan  ASA: III  Anesthesia Plan: General   Post-op Pain Management:    Induction: Intravenous  PONV Risk Score and Plan: 1 and Ondansetron  Airway Management Planned: Oral ETT  Additional Equipment:   Intra-op Plan:   Post-operative Plan: Extubation in OR  Informed Consent: I have reviewed the patients History and Physical, chart, labs and discussed the procedure including the risks, benefits and alternatives for the proposed anesthesia with the patient or authorized representative who has indicated his/her understanding and acceptance.       Plan Discussed with: CRNA  Anesthesia Plan Comments:          Anesthesia Quick Evaluation

## 2018-10-13 NOTE — Anesthesia Postprocedure Evaluation (Signed)
Anesthesia Post Note  Patient: Askari Kinley  Procedure(s) Performed: ELECTROMAGNETIC NAVIGATION BRONCHOSCOPY - LEFT (Left ) ENDOBRONCHIAL ULTRASOUND - LEFT (Left )  Patient location during evaluation: PACU Anesthesia Type: General Level of consciousness: awake and alert and oriented Pain management: pain level controlled Vital Signs Assessment: post-procedure vital signs reviewed and stable Respiratory status: spontaneous breathing, nonlabored ventilation and respiratory function stable Cardiovascular status: blood pressure returned to baseline and stable Postop Assessment: no signs of nausea or vomiting Anesthetic complications: no     Last Vitals:  Vitals:   10/13/18 1523 10/13/18 1533  BP:  117/85  Pulse:  (!) 110  Resp: (!) 22 17  Temp:    SpO2: 100% 92%    Last Pain:  Vitals:   10/13/18 1533  TempSrc:   PainSc: 0-No pain                 Sebastiana Wuest

## 2018-10-13 NOTE — Op Note (Addendum)
Procedures: Electromagnetic Navigation Bronchoscopy Endobronchial ultrasound Confocal Laser Endomicroscopy  Indications: Patient with prior adenocarcinoma of the right lung and SVC syndrome now with new left lower lobe mass and persistent right hilar adenopathy.  Preoperative Diagnosis: LEFT lung nodule/mass; right hilar adenopathy Post Procedure Diagnosis: Same as above Consent: Verbal/Written  The Risks and Benefits of the procedure explained to patient/family prior to start of procedure and I have discussed the risk for pneumothorax, acute bleeding, increased chance of infection, increased chance of respiratory failure and cardiac arrest and death..  The patient/family understand the potential benefits, limitations and complications of the procedure  and have agreed to proceed.  Hand washing performed prior to starting the procedure.   Type of Anesthesia: General endotracheal anesthesia, see Anesthesiology records.   Procedure Performed: 1.  Virtual Bronchoscopy with Multi-planar Image analysis, 3-D reconstruction of coronal, sagittal and multi-planar images for the purposes of planning real-time bronchoscopy using the iLogic Electromagnetic Navigation Bronchoscopy System (superDimension)   2.  Confocal Laser Endomicroscopy 3.  Endobronchial ultrasound  Description of Procedure: After obtaining informed consent from the patient, the above sedative and anesthetic measures were carried out, the Olympus therapeutic flexible video bronchoscope was inserted via a Portex adapter into the endotracheal tube after patient was intubated by CNA/Anesthesiologist.   Airway survey was then performed.  The trachea itself was inspected.  The main carina, right and left midstem bronchus and all the segmental and subsegmental airways were inspected.  After the visual airway inspection was done, and ensuring no endobronchial lesions were present, and extended working channel was introduced into the  working channel of the bronchoscope.  A super dimension locatable guide (LG) was inserted through the extended working channel.  At this point the LG was directed to standard registration points at the following centers: main carina, right upper lobe bronchus, right lower lobe bronchus, right middle lobe bronchus, left upper lobe bronchus, and the left lower lobe bronchus. This data was transferred to the i-Logic ENB system for real-time navigation bronchoscopy.  Once the target was acquired, the catheter was then navigated to the LEFT lower lobe lesion for tissue sampling, the target acquisition was confirmed by fluoroscopy.  At this point the LG was removed and the Cellvizio endomicroscope was inserted into the extended working channel.  There was significant amount of inflammation noted which obscured some of the visual fields.  During inspection half of the field showed abnormal tissue consistent with adenocarcinoma.  Note that this was at the edge of the lesion.  Attempts were made to try to center of the lesion to the extended working channel distal port however because of the anatomy this could not be done.  Transbronchial brushings and transbronchial biopsies  were performed as best could be done from this position.  Additionally fine-needle aspiration with a Gen Cut device was performed x2.  Once this was completed targeted bronchoalveolar lavage was performed instilling 40 mL's total of saline obtaining 15 mL of bronchoalveolar lavage aliquot.  Having completed the ENB portion of the procedure, the therapeutic bronchoscope was switched to an Olympus endobronchial ultrasound enthesis EBUS) scope.  At this point the mediastinum was examined carefully.  There were small shoddy lymph nodes noted on the left hilar area and precarinal area as well as subcarinal area.  These were very vascular and surrounded by large vessels.  Nodes were no larger than 6 to 8 mm in size.  Was a larger node on the right hilar  area that was approximately 1.5 cm  in size, it had central necrosis.  An Olympus 21-gauge EBUS needle was then utilized to obtain TBNA x2 the tissue was fibrotic consistent with prior radiation effect.  Material obtained was scant.  Once this portion of the procedure was completed the airway was examined for adequate hemostasis.  There was mild heme noted at the entry site of the EBUS needle.  This was lavaged till clear.  The patient then received lidocaine bronchial lavage a total of 11 mL was instilled.  Once this was done the patient was allowed to emerge from general anesthesia.  He was extubated successfully in the procedure room and taken to the PACU in satisfactory condition.   Specimens Obtained:  Transbronchial Fine Needle Aspirations Gen Cut X 2  Transbronchial Forceps Biopsy X8  Transbronchial Brush  X4  Targeted BAL 40 mL's in, 15 mL aliquot into CytoLyt.  RIGHT hilar TBNA X2  Fluoroscopy:  Fluoroscopy was utilized during the course of this procedure to guide biopsies and confirm target acquisition.    Complications:None, post procedure chest x-ray showed no pneumothorax.  Estimated Blood Loss: minimal approx 1cc  Monitoring: The patient was monitored in accordance to the guidelines for general anesthesia.   Assessment and Plan/Additional Comments: 1.  Left lower lobe lesion, suspect metastatic disease from original adenocarcinoma of the right lung. 2.  Fibrotic changes in the right hilum.  Follow up Pathology Reports   C. Derrill Kay, MD [place] [facility] Advanced Bronchoscopy

## 2018-10-13 NOTE — Transfer of Care (Signed)
Immediate Anesthesia Transfer of Care Note  Patient: Alexander [name]  Procedure(s) Performed: ELECTROMAGNETIC NAVIGATION BRONCHOSCOPY - LEFT (Left ) ENDOBRONCHIAL ULTRASOUND - LEFT (Left )  Patient Location: PACU  Anesthesia Type:General  Level of Consciousness: awake and alert   Airway & Oxygen Therapy: Patient Spontanous Breathing and Patient connected to nasal cannula oxygen  Post-op Assessment: Report given to RN and Post -op Vital signs reviewed and stable  Post vital signs: Reviewed and stable  Last Vitals:  Vitals Value Taken Time  BP 120/76 10/13/2018  3:04 PM  Temp 36.3 C 10/13/2018  3:04 PM  Pulse 111 10/13/2018  3:09 PM  Resp 19 10/13/2018  3:16 PM  SpO2 98 % 10/13/2018  3:09 PM  Vitals shown include unvalidated device data.  Last Pain:  Vitals:   10/13/18 1504  TempSrc:   PainSc: 0-No pain         Complications: No apparent anesthesia complications

## 2018-10-13 NOTE — Interval H&P Note (Signed)
History and Physical Interval Note:  10/13/2018 12:42 PM  Alexander [name]  has presented today for surgery, with the diagnosis of LEFT LOWER LOBE LESION, LEFT HILAR ADENOPATHY.  The various methods of treatment have been discussed with the patient and family. After consideration of risks, benefits and other options for treatment, the patient has consented to  Procedure(s): ELECTROMAGNETIC NAVIGATION BRONCHOSCOPY - LEFT (Left) ENDOBRONCHIAL ULTRASOUND - LEFT (Left) as a surgical intervention.  The patient's history has been reviewed, patient examined, no change in status, stable for surgery.  I have reviewed the patient's chart and labs.  Questions were answered to the patient's satisfaction.     Vernard Gambles

## 2018-10-13 NOTE — Anesthesia Procedure Notes (Signed)
Procedure Name: Intubation Date/Time: 10/13/2018 1:21 PM Performed by: Johnna Acosta, CRNA Pre-anesthesia Checklist: Patient identified, Emergency Drugs available, Suction available, Patient being monitored and Timeout performed Patient Re-evaluated:Patient Re-evaluated prior to induction Oxygen Delivery Method: Circle system utilized Preoxygenation: Pre-oxygenation with 100% oxygen Induction Type: IV induction Ventilation: Mask ventilation without difficulty and Oral airway inserted - appropriate to patient size Laryngoscope Size: Sabra Heck and 2 Grade View: Grade I Tube type: Oral Tube size: 8.5 mm Number of attempts: 1 Airway Equipment and Method: Stylet and Oral airway Placement Confirmation: ETT inserted through vocal cords under direct vision,  positive ETCO2 and breath sounds checked- equal and bilateral Secured at: 22 cm Tube secured with: Tape Dental Injury: Teeth and Oropharynx as per pre-operative assessment

## 2018-10-13 NOTE — Discharge Instructions (Signed)
Flexible Bronchoscopy, Care After This sheet gives you information about how to care for yourself after your test. Your doctor may also give you more specific instructions. If you have problems or questions, contact your doctor. Follow these instructions at home: Eating and drinking  Do not eat or drink anything (not even water) for 2 hours after your test, or until your numbing medicine (local anesthetic) wears off.  When your numbness is gone and your cough and gag reflexes have come back, you may: ? Eat only soft foods. ? Slowly drink liquids.  The day after the test, go back to your normal diet. Driving  Do not drive for 24 hours if you were given a medicine to help you relax (sedative).  Do not drive or use heavy machinery while taking prescription pain medicine. General instructions   Take over-the-counter and prescription medicines only as told by your doctor.  Return to your normal activities as told. Ask what activities are safe for you.  Do not use any products that have nicotine or tobacco in them. This includes cigarettes and e-cigarettes. If you need help quitting, ask your doctor.  Keep all follow-up visits as told by your doctor. This is important. It is very important if you had a tissue sample (biopsy) taken. Get help right away if:  You have shortness of breath that gets worse.  You get light-headed.  You feel like you are going to pass out (faint).  You have chest pain.  You cough up: ? More than a little blood. ? More blood than before. Summary  Do not eat or drink anything (not even water) for 2 hours after your test, or until your numbing medicine wears off.  Do not use cigarettes. Do not use e-cigarettes.  Get help right away if you have chest pain. This information is not intended to replace advice given to you by your health care provider. Make sure you discuss any questions you have with your health care provider. Document Released: 03/09/2009  Document Revised: 05/30/2016 Document Reviewed: 05/30/2016 Elsevier Interactive Patient Education  2019 [place]   1) The drugs that you were given will stay in your system until tomorrow so for the next 24 hours you should not:  A) Drive an automobile B) Make any legal decisions C) Drink any alcoholic beverage   2) You may resume regular meals tomorrow.  Today it is better to start with liquids and gradually work up to solid foods.  You may eat anything you prefer, but it is better to start with liquids, then soup and crackers, and gradually work up to solid foods.   3) Please notify your doctor immediately if you have any unusual bleeding, trouble breathing, redness and pain at the surgery site, drainage, fever, or pain not relieved by medication.    4) Additional Instructions:        Please contact your physician with any problems or Same Day Surgery at [phone], Monday through Friday 6 am to 4 pm, or [place] at [facility] number at [phone].

## 2018-10-14 ENCOUNTER — Other Ambulatory Visit: Payer: Self-pay

## 2018-10-14 ENCOUNTER — Encounter: Payer: Self-pay | Admitting: Pulmonary Disease

## 2018-10-14 ENCOUNTER — Telehealth: Payer: Self-pay | Admitting: Pulmonary Disease

## 2018-10-14 ENCOUNTER — Ambulatory Visit
Admission: RE | Admit: 2018-10-14 | Discharge: 2018-10-14 | Disposition: A | Discharge status: Home / Self Care | Payer: Medicaid Other | Source: Ambulatory Visit | Attending: Radiation Oncology | Admitting: Radiation Oncology

## 2018-10-14 DIAGNOSIS — Z510 Encounter for antineoplastic radiation therapy: Secondary | ICD-10-CM | POA: Diagnosis not present

## 2018-10-14 NOTE — Telephone Encounter (Signed)
Called patient at 9:50 AM.  Verified I was speaking to Mr. Alexander [name], 2 identifiers were utilized.  The purpose of the call was to follow-up on navigational bronchoscopy performed yesterday.  Patient had no next of kin with him due to COVID-19 restrictions and wanted to verify that he had understood findings of the procedure since he was postanesthesia.  Patient tolerated the procedure well.  He has no complaint except that he is coughing up tenacious material.  No hemoptysis.  No chest pain.  Shortness of breath.  Note is made that he had significant secretions noted during bronchoscopy.  Patient continues to engage in smoking and was counseled with regards to discontinuation of smoking.  Patient had no other questions or concerns.  Discussed the details of the procedure yesterday.  I will be discussing with Dr. Janese Banks as well.  Patient was instructed to resume his Eliquis today with the evening dose.  He understood the instructions.  We will call him with results.  He is to call should any new difficulties arise to include fever, chest pain, purulent sputum production or hemoptysis etc.

## 2018-10-15 LAB — CYTOLOGY - NON PAP

## 2018-10-15 LAB — SURGICAL PATHOLOGY

## 2018-10-19 ENCOUNTER — Encounter: Payer: Self-pay | Admitting: Oncology

## 2018-10-19 ENCOUNTER — Other Ambulatory Visit: Payer: Self-pay

## 2018-10-19 ENCOUNTER — Other Ambulatory Visit: Payer: Self-pay | Admitting: Oncology

## 2018-10-19 ENCOUNTER — Ambulatory Visit
Admission: RE | Admit: 2018-10-19 | Discharge: 2018-10-19 | Disposition: A | Discharge status: Home / Self Care | Payer: Medicaid Other | Source: Ambulatory Visit | Attending: Radiation Oncology | Admitting: Radiation Oncology

## 2018-10-19 DIAGNOSIS — C3411 Malignant neoplasm of upper lobe, right bronchus or lung: Secondary | ICD-10-CM

## 2018-10-19 DIAGNOSIS — Z510 Encounter for antineoplastic radiation therapy: Secondary | ICD-10-CM | POA: Diagnosis not present

## 2018-10-19 NOTE — Progress Notes (Signed)
START ON PATHWAY REGIMEN - Non-Small Cell Lung     A cycle is every 21 days:     Paclitaxel      Carboplatin      Bevacizumab-xxxx   **Always confirm dose/schedule in your pharmacy ordering system**  Patient Characteristics: Stage IV Metastatic, Nonsquamous, Second Line - Chemotherapy/Immunotherapy, PS = 0, 1, Prior PD-1/PD-L1 Inhibitor as Monotherapy AJCC T Category: T3 Current Disease Status: Distant Metastases AJCC N Category: N2 AJCC M Category: M0 AJCC 8 Stage Grouping: IIIB Histology: Nonsquamous Cell ROS1 Rearrangement Status: Awaiting Test Results T790M Mutation Status: Not Applicable - EGFR Mutation Negative/Unknown Other Mutations/Biomarkers: No Other Actionable Mutations NTRK Gene Fusion Status: Awaiting Test Results PD-L1 Expression Status: PD-L1 Negative Chemotherapy/Immunotherapy LOT: Second Line Chemotherapy/Immunotherapy Molecular Targeted Therapy: Not Appropriate ALK Rearrangement Status: Awaiting Test Results EGFR Mutation Status: Awaiting Test Results BRAF V600E Mutation Status: Awaiting Test Results ECOG Performance Status: 1 Immunotherapy Candidate Status: Not a Candidate for Immunotherapy Prior Immunotherapy Status: Prior PD-1/PD-L1 Inhibitor as Monotherapy Intent of Therapy: Non-Curative / Palliative Intent, Discussed with Patient

## 2018-10-19 NOTE — Research (Signed)
Human Biospecimens for the Discovery and Validation of Biomarkers for the Prediction, Diagnosis and Management of Disease  ADX01 - [id number] PI: Bobbye Riggs, MD Attending: Janese Banks Visit: Consent  [place] Patient had the opportunity to ask questions. [facility] Consenting personnel has reviewed consent in entirety with patient.Discussion included, but not limited to: protocol expectations/evaluations, side effects, risks and benefits of the procedures associated with the study. [place] Patient verbalizes good understanding of all instructions and information inconsent, including the voluntary nature of participation, and agrees to complywith all protocol requirements. [place] The patient wishes to participate in this clinical trial and willingly signedthe consent document. [place] Copy of signed consent was given to the patient.  [place] No study-specific procedures were done prior to consent. Any study specifictests/procedures will now be scheduled and completed per protocol for screeningpurposes. [place] Patient signed the ICF (IRB approved 12/10/2017), HIPPA (IRB approved 11/16/2017) forms on 10/19/2018 at his radiation appointment [facility] The patient was provided with the contact information for the PI, research associate and the IRB if they have any research related questions [facility] The attending physician was notified that the patient consented to the study. [place]  Blood to be drawn at his 10/21/2018 lab appointment [place] Associate 10/19/2018 3:09

## 2018-10-19 NOTE — Progress Notes (Signed)
DISCONTINUE ON PATHWAY REGIMEN - Non-Small Cell Lung     A cycle is every 14 days:     Durvalumab   **Always confirm dose/schedule in your pharmacy ordering system**  REASON: Disease Progression PRIOR TREATMENT: [id number]: Durvalumab 10 mg/kg q14 Days x up to 12 Months TREATMENT RESPONSE: Progressive Disease (PD)    Patient Characteristics: Stage IV Metastatic, Nonsquamous, Second Line - Chemotherapy/Immunotherapy, PS = 0, 1, Prior PD-1/PD-L1 Inhibitor as Monotherapy AJCC T Category: T3 Current Disease Status: Distant Metastases AJCC N Category: N2 AJCC M Category: M0 AJCC 8 Stage Grouping: IIIB Histology: Nonsquamous Cell ROS1 Rearrangement Status: Awaiting Test Results T790M Mutation Status: Not Applicable - EGFR Mutation Negative/Unknown Other Mutations/Biomarkers: No Other Actionable Mutations NTRK Gene Fusion Status: Awaiting Test Results PD-L1 Expression Status: PD-L1 Negative Chemotherapy/Immunotherapy LOT: Second Line Chemotherapy/Immunotherapy Molecular Targeted Therapy: Not Appropriate ALK Rearrangement Status: Awaiting Test Results EGFR Mutation Status: Awaiting Test Results BRAF V600E Mutation Status: Awaiting Test Results ECOG Performance Status: 1 Immunotherapy Candidate Status: Not a Candidate for Immunotherapy Prior Immunotherapy Status: Prior PD-1/PD-L1 Inhibitor as Monotherapy

## 2018-10-20 ENCOUNTER — Ambulatory Visit
Admission: RE | Admit: 2018-10-20 | Discharge: 2018-10-20 | Disposition: A | Discharge status: Home / Self Care | Payer: Medicaid Other | Source: Ambulatory Visit | Attending: Radiation Oncology | Admitting: Radiation Oncology

## 2018-10-20 ENCOUNTER — Other Ambulatory Visit: Payer: Self-pay

## 2018-10-20 ENCOUNTER — Encounter: Payer: Self-pay | Admitting: Oncology

## 2018-10-20 DIAGNOSIS — Z510 Encounter for antineoplastic radiation therapy: Secondary | ICD-10-CM | POA: Diagnosis not present

## 2018-10-21 ENCOUNTER — Encounter: Payer: Self-pay | Admitting: Pharmacy Technician

## 2018-10-21 ENCOUNTER — Inpatient Hospital Stay: Payer: Medicaid Other

## 2018-10-21 ENCOUNTER — Ambulatory Visit
Admission: RE | Admit: 2018-10-21 | Discharge: 2018-10-21 | Disposition: A | Discharge status: Home / Self Care | Payer: Medicaid Other | Source: Ambulatory Visit | Attending: Radiation Oncology | Admitting: Radiation Oncology

## 2018-10-21 ENCOUNTER — Inpatient Hospital Stay (HOSPITAL_BASED_OUTPATIENT_CLINIC_OR_DEPARTMENT_OTHER): Payer: Medicaid Other | Admitting: Oncology

## 2018-10-21 ENCOUNTER — Other Ambulatory Visit: Payer: Self-pay | Admitting: *Deleted

## 2018-10-21 ENCOUNTER — Encounter: Payer: Self-pay | Admitting: *Deleted

## 2018-10-21 ENCOUNTER — Other Ambulatory Visit: Payer: Self-pay | Admitting: Oncology

## 2018-10-21 ENCOUNTER — Other Ambulatory Visit: Payer: Self-pay

## 2018-10-21 ENCOUNTER — Encounter: Payer: Self-pay | Admitting: Oncology

## 2018-10-21 VITALS — BP 108/75 | HR 124 | Temp 99.2°F | Resp 18 | Ht 69.0 in | Wt 121.8 lb

## 2018-10-21 DIAGNOSIS — J449 Chronic obstructive pulmonary disease, unspecified: Secondary | ICD-10-CM

## 2018-10-21 DIAGNOSIS — Z79899 Other long term (current) drug therapy: Secondary | ICD-10-CM

## 2018-10-21 DIAGNOSIS — C7951 Secondary malignant neoplasm of bone: Secondary | ICD-10-CM

## 2018-10-21 DIAGNOSIS — Z510 Encounter for antineoplastic radiation therapy: Secondary | ICD-10-CM | POA: Diagnosis not present

## 2018-10-21 DIAGNOSIS — Z7951 Long term (current) use of inhaled steroids: Secondary | ICD-10-CM

## 2018-10-21 DIAGNOSIS — C3411 Malignant neoplasm of upper lobe, right bronchus or lung: Secondary | ICD-10-CM

## 2018-10-21 DIAGNOSIS — E860 Dehydration: Secondary | ICD-10-CM

## 2018-10-21 DIAGNOSIS — F17210 Nicotine dependence, cigarettes, uncomplicated: Secondary | ICD-10-CM

## 2018-10-21 DIAGNOSIS — Z95828 Presence of other vascular implants and grafts: Secondary | ICD-10-CM

## 2018-10-21 DIAGNOSIS — R918 Other nonspecific abnormal finding of lung field: Secondary | ICD-10-CM | POA: Diagnosis not present

## 2018-10-21 DIAGNOSIS — Z7189 Other specified counseling: Secondary | ICD-10-CM

## 2018-10-21 LAB — CBC WITH DIFFERENTIAL/PLATELET
Abs Immature Granulocytes: 0.06 10*3/uL (ref 0.00–0.07)
Basophils Absolute: 0.1 10*3/uL (ref 0.0–0.1)
Basophils Relative: 1 %
Eosinophils Absolute: 0.1 10*3/uL (ref 0.0–0.5)
Eosinophils Relative: 1 %
HCT: 29.2 % — ABNORMAL LOW (ref 39.0–52.0)
Hemoglobin: 9.2 g/dL — ABNORMAL LOW (ref 13.0–17.0)
Immature Granulocytes: 1 %
Lymphocytes Relative: 5 %
Lymphs Abs: 0.5 10*3/uL — ABNORMAL LOW (ref 0.7–4.0)
MCH: 27.7 pg (ref 26.0–34.0)
MCHC: 31.5 g/dL (ref 30.0–36.0)
MCV: 88 fL (ref 80.0–100.0)
Monocytes Absolute: 1.1 10*3/uL — ABNORMAL HIGH (ref 0.1–1.0)
Monocytes Relative: 10 %
Neutro Abs: 8.6 10*3/uL — ABNORMAL HIGH (ref 1.7–7.7)
Neutrophils Relative %: 82 %
Platelets: 246 10*3/uL (ref 150–400)
RBC: 3.32 MIL/uL — ABNORMAL LOW (ref 4.22–5.81)
RDW: 14.9 % (ref 11.5–15.5)
WBC: 10.4 10*3/uL (ref 4.0–10.5)
nRBC: 0 % (ref 0.0–0.2)

## 2018-10-21 LAB — COMPREHENSIVE METABOLIC PANEL
ALT: 14 U/L (ref 0–44)
AST: 25 U/L (ref 15–41)
Albumin: 3.4 g/dL — ABNORMAL LOW (ref 3.5–5.0)
Alkaline Phosphatase: 170 U/L — ABNORMAL HIGH (ref 38–126)
Anion gap: 11 (ref 5–15)
BUN: 14 mg/dL (ref 6–20)
CO2: 29 mmol/L (ref 22–32)
Calcium: 8.9 mg/dL (ref 8.9–10.3)
Chloride: 94 mmol/L — ABNORMAL LOW (ref 98–111)
Creatinine, Ser: 1.41 mg/dL — ABNORMAL HIGH (ref 0.61–1.24)
GFR calc Af Amer: >60 mL/min (ref >60)
GFR calc non Af Amer: 56 mL/min — ABNORMAL LOW (ref >60)
Glucose, Bld: 97 mg/dL (ref 70–99)
Potassium: 3.4 mmol/L — ABNORMAL LOW (ref 3.5–5.1)
Sodium: 134 mmol/L — ABNORMAL LOW (ref 135–145)
Total Bilirubin: 0.5 mg/dL (ref 0.3–1.2)
Total Protein: 7.4 g/dL (ref 6.5–8.1)

## 2018-10-21 MED ORDER — SODIUM CHLORIDE 0.9% FLUSH
10.0000 mL | Freq: Once | INTRAVENOUS | Status: AC
  Administered 2018-10-21: 10 mL
  Filled 2018-10-21: qty 10

## 2018-10-21 MED ORDER — DENOSUMAB 120 MG/1.7ML ~~LOC~~ SOLN
120.0000 mg | SUBCUTANEOUS | Status: DC
  Administered 2018-10-21: 12:00:00 120 mg via SUBCUTANEOUS
  Filled 2018-10-21: qty 1.7

## 2018-10-21 MED ORDER — SODIUM CHLORIDE 0.9 % IV SOLN
INTRAVENOUS | Status: DC
  Administered 2018-10-21: 11:00:00 via INTRAVENOUS
  Filled 2018-10-21 (×2): qty 250

## 2018-10-21 MED ORDER — HEPARIN SOD (PORK) LOCK FLUSH 100 UNIT/ML IV SOLN
500.0000 [IU] | Freq: Once | INTRAVENOUS | Status: AC
  Administered 2018-10-21: 12:00:00 500 [IU]

## 2018-10-21 NOTE — Research (Signed)
Patient presented today for already scheduled lab work and gave blood to complete enrollment in [facility] pathology study "Procurement of Human Biospecimens fir the [employer] and Validation of Biomarkers for the Prediction, Diagnosis and Management of Disease."  Patient was given gift card provided by study. I thanked patient for his study participation.   Lula Olszewski Oncology Research Associate 10/21/2018 09:00 AM

## 2018-10-21 NOTE — Progress Notes (Signed)
Patient has been approved for drug assistance by [employer] for [employer]. The enrollment period is from 10/21/18-10/21/19 based on self pay. First DOS covered is 10/21/18.

## 2018-10-21 NOTE — Progress Notes (Signed)
  Oncology Nurse Navigator Documentation  Navigator Location: [facility]Med Onc (10/21/18 1100)   )Navigator Encounter Type: Follow-up Appt (10/21/18 1100)                     Patient Visit Type: MedOnc (10/21/18 1100)   Barriers/Navigation Needs: Education;Coordination of Care;Family concerns (10/21/18 1100) Education: Understanding Cancer/ Treatment Options;Coping with Diagnosis/ Prognosis (10/21/18 1100) Interventions: Coordination of Care;Education;Psycho-social support (10/21/18 1100)   Coordination of Care: Appts;Chemo (10/21/18 1100) Education Method: Verbal (10/21/18 1100)           met with patient during follow up visit with Dr. Janese [name] to discuss second line treatment for metastatic lung cancer. All questions answered during visit. Pt requested that I call his friend, Alexander [name], to update on treatment plan and recommendations. Spoke with Alexander [name] over the phone and she has been made aware of all recommendations from Dr. Janese [name]. Instructed pt and Alexander [name] to call back if has any further needs. Understanding verbalized.      Time Spent with Patient: 90 (10/21/18 1100)

## 2018-10-21 NOTE — Progress Notes (Signed)
Patient has been approved for drug assistance by [employer] for Neulasta. The enrollment period is from 10/21/18-10/21/19 based on self pay. First DOS covered is 10/21/18.

## 2018-10-21 NOTE — Progress Notes (Signed)
Patient has been approved for drug assistance by [place] for Avastin. The enrollment period is from 10/21/18-10/21/19 based on self pay. First DOS covered is 11/02/18.

## 2018-10-21 NOTE — Progress Notes (Signed)
Hematology/Oncology Consult note [facility]  [phone] [phone]  Patient Care Team: Patient, No Pcp Per as PCP - General ([place]) Telford Nab, RN as Registered Nurse   Name of the patient: Alexander [name]  [id number]  11-Oct-1965   Date of visit: 10/21/18  Diagnosis- lung adenocarcinoma Stage IIIB cT3 cN2 cM0 now with right acetabular metastases and progression of disease in the left lung  Chief complaint/ Reason for visit-discussed Omniseq results and further management  Heme/Onc history: patient is a 53 year old male with a long-standing history of smoking. He has smoked 1 to 1/2 pack of cigarettes per day for over 30 years. He presented to outside urgent care with some symptoms of shortness of breath and was treated for URI. He subsequently presented with facial and neck swelling which was thought by outside ER to be secondary to a drug reaction. Following that patient came to ER here at [facility].  He underwent CT chest abdomen and pelvis which showed 2.1 x 2 cm irregular mass in the right upper lobe concerning for malignancy.. Multiple collateral veins in chest and abdomen probable thrombosis of the left brachiocephalic vein. This is consistent with IVC syndrome Probable right paratracheal right hilar and precarinal adenopathy resulting in severe narrowing and thrombosis of SVC. This is consistent with SVC syndrome  Patient was seen by vascular surgery and there was no acute need for any endovascular therapies. He is on anticoagulation with Coumadin which has been subsequently changed to Eliquis as an outpatient. Patient initially underwent a bronchoscopy guided biopsy of the mediastinal lymph nodes which was inconclusive and underwent a second IR guided biopsy of the right upper lobe lung mass which was consistent with adenocarcinoma  Patient started radiation treatment on 03/15/2018. Plan is to give 4 cycles of  cisplatin and Alimta followed by maintenance durvalumab. Cycle 1 of cisplatin Alimta given on 03/16/2018.Patient completed 4 cycles of cisplatin and Alimta along with concurrent radiation on 05/20/2018. Scan showed partial response. Cycle 1 of durvalumab given on 06/17/2018. Patient presented to the ER with hypotension tachycardia and was found to have pericardial effusion 10 days after first dose of durvalumab. Fluid negative for malignancy  Patient was empirically treated for pericardial effusion probably secondary to immunotherapy versus radiation and completed 1 month of steroids. Repeat echocardiogram was normal. PDL 1 testing on his initial tumor specimen showed expression of less than 1%.   Interval history-he has been feeling tired and has not been ambulating as much because of his right hip pain.  He has been taking PRN oxycodone for this and currently undergoing palliative radiation.  His appetite was down for the last few weeks but he is trying to eat and drink better.  ECOG PS- 1 Pain scale- 0 Opioid associated constipation- no  Review of systems- Review of Systems  Constitutional: Positive for malaise/fatigue and weight loss. Negative for chills and fever.  HENT: Negative for congestion, ear discharge and nosebleeds.   Eyes: Negative for blurred vision.  Respiratory: Negative for cough, hemoptysis, sputum production, shortness of breath and wheezing.   Cardiovascular: Negative for chest pain, palpitations, orthopnea and claudication.  Gastrointestinal: Negative for abdominal pain, blood in stool, constipation, diarrhea, heartburn, melena, nausea and vomiting.  Genitourinary: Negative for dysuria, flank pain, frequency, hematuria and urgency.  Musculoskeletal: Positive for joint pain (Right hip pain). Negative for back pain and myalgias.  Skin: Negative for rash.  Neurological: Negative for dizziness, tingling, focal weakness, seizures, weakness and headaches.  Endo/Heme/Allergies: Does not bruise/bleed easily.  Psychiatric/Behavioral: Negative for depression and suicidal ideas. The patient does not have insomnia.       Allergies  Allergen Reactions   Doxycycline Swelling   Penicillins Swelling    Did it involve swelling of the face/tongue/throat, SOB, or low BP? Unknown Did it involve sudden or severe rash/hives, skin peeling, or any reaction on the inside of your mouth or nose? Unknown Did you need to seek medical attention at a hospital or doctor's office? Unknown When did it last happen?Childhood If all above answers are NO, may proceed with cephalosporin use.     Past Medical History:  Diagnosis Date   Cancer ([place])    COPD (chronic obstructive pulmonary disease) ([place])    Lung mass      Past Surgical History:  Procedure Laterality Date   ELECTROMAGNETIC NAVIGATION BROCHOSCOPY Left 10/13/2018   Procedure: ELECTROMAGNETIC NAVIGATION BRONCHOSCOPY - LEFT;  Surgeon: Tyler Pita, MD;  Location: [facility];  Service: Cardiopulmonary;  Laterality: Left;   ENDOBRONCHIAL ULTRASOUND N/A 03/11/2018   Procedure: ENDOBRONCHIAL ULTRASOUND;  Surgeon: Tyler Pita, MD;  Location: [facility];  Service: Cardiopulmonary;  Laterality: N/A;   ENDOBRONCHIAL ULTRASOUND Left 10/13/2018   Procedure: ENDOBRONCHIAL ULTRASOUND - LEFT;  Surgeon: Tyler Pita, MD;  Location: [facility];  Service: Cardiopulmonary;  Laterality: Left;   none     PORTA CATH INSERTION N/A 03/12/2018   Procedure: PORTA CATH INSERTION, thigh;  Surgeon: Katha Cabal, MD;  Location: [place] [facility];  Service: Cardiovascular;  Laterality: N/A;   SUBXYPHOID PERICARDIAL WINDOW N/A 06/30/2018   Procedure: SUBXYPHOID PERICARDIAL WINDOW;  Surgeon: [name], MD;  Location: [place];  Service: Open Heart Surgery;  Laterality: N/A;   TEE WITHOUT CARDIOVERSION N/A 06/30/2018   Procedure: TRANSESOPHAGEAL ECHOCARDIOGRAM (TEE);  Surgeon: [name], MD;  Location: [place];  Service: Open Heart Surgery;  Laterality: N/A;    Social History   Socioeconomic History   Marital status: Divorced    Spouse name: Not on file   Number of children: Not on file   Years of education: Not on file   Highest education level: Not on file  Occupational History   Occupation: Cabin crew  Social Needs   Financial resource strain: Not on file   Food insecurity:    Worry: Not on file    Inability: Not on file   Transportation needs:    Medical: Not on file    Non-medical: Not on file  Tobacco Use   Smoking status: Current Every Day Smoker    Packs/day: 0.25   Smokeless tobacco: Never Used   Tobacco comment: 4-5 cig a day  Substance and Sexual Activity   Alcohol use: Not Currently   Drug use: Not Currently    Types: Marijuana   Sexual activity: Not Currently  Lifestyle   Physical activity:    Days per week: Not on file    Minutes per session: Not on file   Stress: Not on file  Relationships   Social connections:    Talks on phone: Not on file    Gets together: Not on file    Attends religious service: Not on file    Active member of club or organization: Not on file    Attends meetings of clubs or organizations: Not on file    Relationship status: Not on file   Intimate partner violence:    Fear of current or ex partner: Not on file  Emotionally abused: Not on file    Physically abused: Not on file    Forced sexual activity: Not on file  Other Topics Concern   Not on file  Social History Narrative   Not on file    No family history on file.   Current Outpatient Medications:    apixaban (ELIQUIS) 5 MG TABS tablet, Take 1 tablet (5 mg total) by mouth 2 (two) times daily for 30 days., Disp: 60 tablet, Rfl: 3   Ipratropium-Albuterol (COMBIVENT RESPIMAT) 20-100 MCG/ACT AERS respimat, Inhale 1 puff into the lungs every 6 (six) hours as needed for wheezing or shortness of breath., Disp: 1 Inhaler,  Rfl: 2   Oxycodone HCl 10 MG TABS, Take 1 tablet (10 mg total) by mouth every 4 (four) hours as needed., Disp: 120 tablet, Rfl: 0   polyethylene glycol (MIRALAX / GLYCOLAX) 17 g packet, Take 17 g by mouth 2 (two) times a week., Disp: , Rfl:  No current facility-administered medications for this visit.   Facility-Administered Medications Ordered in Other Visits:    0.9 %  sodium chloride infusion, , Intravenous, Continuous, Sindy Guadeloupe, MD, Stopped at 10/21/18 1147   denosumab (XGEVA) injection 120 mg, 120 mg, Subcutaneous, Q30 days, Sindy Guadeloupe, MD, 120 mg at 10/21/18 1149  Physical exam:  Vitals:   10/21/18 0859  BP: 108/75  Pulse: (!) 124  Resp: 18  Temp: 99.2 F (37.3 C)  TempSrc: Tympanic  Weight: 121 lb 12.8 oz (55.2 kg)  Height: 5\' 9"  (1.753 m)   Physical Exam HENT:     Head: Normocephalic and atraumatic.  Eyes:     Pupils: Pupils are equal, round, and reactive to light.  Neck:     Musculoskeletal: Normal range of motion.     Comments: Distended neck veins Cardiovascular:     Rate and Rhythm: Regular rhythm. Tachycardia present.     Heart sounds: Normal heart sounds.  Pulmonary:     Effort: Pulmonary effort is normal.     Breath sounds: Normal breath sounds.  Abdominal:     General: Bowel sounds are normal.     Palpations: Abdomen is soft.     Comments: Distended abdominal veins  Skin:    General: Skin is warm and dry.  Neurological:     Mental Status: He is alert and oriented to person, place, and time.      CMP Latest Ref Rng & Units 10/21/2018  Glucose 70 - 99 mg/dL 97  BUN 6 - 20 mg/dL 14  Creatinine 0.61 - 1.24 mg/dL 1.41(H)  Sodium 135 - 145 mmol/L 134(L)  Potassium 3.5 - 5.1 mmol/L 3.4(L)  Chloride 98 - 111 mmol/L 94(L)  CO2 22 - 32 mmol/L 29  Calcium 8.9 - 10.3 mg/dL 8.9  Total Protein 6.5 - 8.1 g/dL 7.4  Total Bilirubin 0.3 - 1.2 mg/dL 0.5  Alkaline Phos 38 - 126 U/L 170(H)  AST 15 - 41 U/L 25  ALT 0 - 44 U/L 14   CBC Latest Ref Rng  & Units 10/21/2018  WBC 4.0 - 10.5 K/uL 10.4  Hemoglobin 13.0 - 17.0 g/dL 9.2(L)  Hematocrit 39.0 - 52.0 % 29.2(L)  Platelets 150 - 400 K/uL 246    No images are attached to the encounter.  Ct Angio Chest Pe W And/or Wo Contrast  Result Date: 09/25/2018 CLINICAL DATA:  History of stage III lung cancer. No chemo or radiation at this time. Left-sided chest pain and fever starting yesterday. EXAM: CT ANGIOGRAPHY  CHEST WITH CONTRAST TECHNIQUE: Multidetector CT imaging of the chest was performed using the standard protocol during bolus administration of intravenous contrast. Multiplanar CT image reconstructions and MIPs were obtained to evaluate the vascular anatomy. CONTRAST:  51mL OMNIPAQUE IOHEXOL 350 MG/ML SOLN COMPARISON:  Chest x-ray Sep 25, 2018.  Chest CT September 17, 2018. FINDINGS: Cardiovascular: The heart is unchanged. Mild atherosclerotic changes are seen in the thoracic aorta without aneurysm or dissection. The known narrowing of the left brachiocephalic vein and severe narrowing versus occlusion of the superior vena cava are not well appreciated on today's study at it has there is no opacification on the left, possibly due to timing of contrast. The collaterals in the chest wall remain. No acute pulmonary emboli. Mediastinum/Nodes: The thyroid and esophagus are unremarkable. A right hilar node on series 4, image 45 measures 15 mm today versus 11 mm previously. The low right paratracheal node on series 4, image 38 measures 17 mm on both studies in short axis. A prevascular node on series 4, image 39 measures 10 mm in short axis today versus 6 mm June 29, 2018. No left hilar adenopathy. No other change in the mediastinal nodes. A small pericardial effusion is similar to mildly more prominent the interval. The effusion measures 12 mm in thickness on series 4, image 83 today versus 6 mm in thickness previously. There is a small left pleural effusion which is new. Lungs/Pleura: The trachea and mainstem  bronchi are patent. Left-sided central airways are normal. There is wall thickening associated with a right upper lobe branch as seen on axial image 39. There is soft tissue fullness in the right suprahilar region which is stable. There is short segment occlusion of a posterior right upper lobe branch of the bronchus. This is also stable. Narrowing of the right middle lobe bronchus as it arises from the bronchus intermedius is seen on series 6, image 54. Narrowing of a right lower lobe bronchus as it arises from the bronchus intermedius on image 56 is identified. There is associated thickening of the right bronchus intermedius. No pneumothorax. Emphysematous changes seen in the lungs. The right apically nodule on series 6, image 18 measures 10 by 5 mm today, unchanged. The site of the patient's primary malignancy in the medial right upper lobe measures 19 x 12 mm today, not significantly changed measuring 20 x 14 mm previously. The spiculated masslike opacity in the left lower lobe measures 2.7 by 2.5 cm today versus 2.8 x 2.7 cm previously, similar in the interval. This spiculated masslike opacity abuts a left lower lobe pulmonary artery. Just distal to this nodular density is a triangular region of opacification in the posterior left lower lobe, likely postobstructive change. Several tiny scattered nodules in the lungs are otherwise stable. No new nodules, masses, or infiltrates. Upper Abdomen: No acute abnormality. Musculoskeletal: No chest wall abnormality. No acute or significant osseous findings. Review of the MIP images confirms the above findings. IMPRESSION: 1. No pulmonary emboli identified. 2. The right apical nodule on series 6, image 18 is stable. The site of the known malignancy in the medial right upper lobe is not significantly changed in the interval. 3. The spiculated masslike opacity in the left lower lobe is similar in the interval with associated postobstructive changes. This is not changed  since September 17, 2018. The lesion is highly concerning for neoplasm, either metastatic or a primary bronchogenic neoplasm. A focal pneumonia is possible but less likely given the stability. 4. Soft tissue fullness in  the right suprahilar region is stable. Bronchial wall thickening and narrowing of some upper lobe bronchial branches is stable. 5. There is increased thickening of the bronchus intermedius and branching vessels with some narrowing of right middle and lower lobe bronchi. This is a new finding. 6. New left pleural effusion. The pericardial effusion is a little larger in the interval. 7. A prevascular node and right hilar node are little larger in the interval. Other nodes are similar in the interval. 8. 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 This is not as well assessed today due to timing of contrast and neither of these veins opacifies on today's study. Aortic Atherosclerosis (ICD10-I70.0) and Emphysema (ICD10-J43.9). 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 EXAM: NUCLEAR MEDICINE PET SKULL BASE TO THIGH TECHNIQUE: 6.4 mCi F-18 FDG was injected intravenously. Full-ring PET imaging was performed from the skull base to thigh after the radiotracer. CT data was obtained and used for attenuation correction and anatomic localization. Fasting blood glucose: 102 mg/dl COMPARISON:  09/25/2018 chest CT. 09/17/2018 CT chest, abdomen and pelvis. 03/18/2018 PET-CT. FINDINGS: Mediastinal blood pool activity: SUV max 1.9 NECK: No enlarged or hypermetabolic neck lymph nodes.  Incidental CT findings: none CHEST: Spiculated hypermetabolic 2.9 x 2.6 cm central left lower lobe solid pulmonary nodule with max SUV 7.9, new from 03/18/2018 PET-CT, mildly increased in size from 2.7 x 2.4 cm on recent 09/17/2018 chest CT. Less intense hypermetabolism (max SUV 4.0) within a peripheral wedge-shaped focus of consolidation in the posterior left lower lobe, which is similar in size since 09/17/2018 chest CT and new since 06/29/2018 chest CT. No additional hypermetabolic pulmonary findings. Medial apical right upper lobe 1.1 cm irregular solid pulmonary nodule demonstrates no residual hypermetabolism (max SUV 1.5) and is stable in size from recent chest CT studies. Peripheral apical right upper lobe 1.0 cm pulmonary nodule demonstrates no hypermetabolism (max SUV 1.5) and is stable from recent chest CT studies. No enlarged or hypermetabolic axillary, mediastinal or right hilar lymph nodes. No residual hypermetabolism within right paratracheal and right hilar nodes. Mildly hypermetabolic left infrahilar node with max SUV 2.7, new from prior PET-CT. Incidental CT findings: Small dependent left pleural effusion, stable from recent chest CT. Stable small pericardial effusion. Atherosclerotic nonaneurysmal thoracic aorta. Moderate centrilobular emphysema. ABDOMEN/PELVIS: No abnormal hypermetabolic activity within the liver, pancreas, adrenal glands, or spleen. No hypermetabolic lymph nodes in the abdomen or pelvis. Incidental CT findings: Trace free fluid in the pelvis. Atherosclerotic nonaneurysmal abdominal aorta. Right common femoral Port-A-Cath terminates in the infrahepatic IVC. SKELETON: New hypermetabolic lytic 1.5 cm right acetabular lesion with max SUV 6.6 (series 3/image 236). No additional hypermetabolic skeletal lesions. Incidental CT findings: none IMPRESSION: 1. New hypermetabolic lytic right acetabular bone metastasis. 2. Hypermetabolic (max SUV 7.9) spiculated 2.9 cm central left lower lobe  pulmonary nodule, mildly increased in size since recent 09/17/2018 chest CT, new from studies previous to 09/17/2018, most compatible with malignancy, probably a contralateral metastasis, although the differential includes a metachronous primary bronchogenic carcinoma. 3. Less intense hypermetabolism associated with a wedge-shaped focus of consolidation in the posterior left lower lobe, favor small focus  of postobstructive pneumonia. 4. New mildly hypermetabolic left infrahilar lymph node, likely a nodal metastasis. 5. Small dependent left pleural effusion. 6. No residual/recurrent hypermetabolism within the treated medial apical right upper lobe pulmonary lesion or within the treated right hilar and right paratracheal nodes. 7. Aortic Atherosclerosis (ICD10-I70.0) and Emphysema (ICD10-J43.9). Electronically Signed   By: Ilona Sorrel M.D.   On: 09/28/2018 16:53   Dg Chest Port 1 View  Result Date: 10/13/2018 CLINICAL DATA:  Status post bronchoscopy EXAM: PORTABLE CHEST 1 VIEW COMPARISON:  CT chest 10/11/2018 FINDINGS: Trace right apical pneumothorax. Lungs are clear. No pleural effusion. Normal cardiomediastinal contours. IMPRESSION: Trace right apical pneumothorax. Electronically Signed   By: Ulyses Jarred M.D.   On: 10/13/2018 15:50   Dg Chest Portable 1 View  Result Date: 09/25/2018 CLINICAL DATA:  LEFT side chest pain and fever EXAM: PORTABLE CHEST 1 VIEW COMPARISON:  Portable exam 1830 hours compared to 07/22/2018 Correlation: CT chest 09/17/2018 FINDINGS: Normal heart size, mediastinal contours, and pulmonary vascularity. Lungs emphysematous with probable BILATERAL nipple shadows. No acute infiltrate, pleural effusion or pneumothorax. Bones unremarkable. IMPRESSION: Emphysematous changes with probable BILATERAL nipple shadows; no definite pulmonary nodules at these sites on recent CT. No acute infiltrate. Electronically Signed   By: Lavonia Dana M.D.   On: 09/25/2018 18:56   Dg Fluoro Rm 1-60 Min - No  Report  Result Date: 10/13/2018 Fluoroscopy was utilized by the requesting physician.  No radiographic interpretation.   Dg Femur, Min 2 Views Right  Result Date: 10/06/2018 CLINICAL DATA:  Right anterior hip pain history of metastatic lung cancer EXAM: RIGHT FEMUR 2 VIEWS COMPARISON:  09/28/2018 FINDINGS: Intact right femur. Negative for fracture. Right femoral power port catheter noted. No subluxation or dislocation. Included views of the right hemipelvis demonstrate no displaced fracture. Known right acetabular metastatic lesion by PET-CT is not well appreciated by plain radiography. Peripheral atherosclerosis noted. IMPRESSION: No acute osseous finding by plain radiography.  See above comment. Electronically Signed   By: Jerilynn Mages.  Shick M.D.   On: 10/06/2018 13:29   Ct Super D Chest Wo Contrast  Result Date: 10/11/2018 CLINICAL DATA:  Mass in left lung.  Scheduled for bronchoscopy. EXAM: CT CHEST WITHOUT CONTRAST TECHNIQUE: Multidetector CT imaging of the chest was performed using thin slice collimation for electromagnetic bronchoscopy planning purposes, without intravenous contrast. COMPARISON:  09/28/2018 FINDINGS: Cardiovascular: Aortic atherosclerosis. Mild pericardial thickening versus effusion. Similar. Mediastinum/Nodes: Normal appearance of the thyroid gland. The trachea appears patent and is midline. -There is ill-defined soft tissue infiltration within the mediastinum which appears similar to 09/25/2018. -index right paratracheal lymph node is stable measuring 1.6 cm. -index pre-vascular node measures 0.7 cm, image 27/2. -Index right hilar lymph node measures 1.3 cm, image 31/2. Previously 1.5 cm. Lungs/Pleura: No pleural effusion identified. The treated lesion within the medial aspect of the right apex measures 1.5 by 1.3 cm, image 11/2. Previously this measured the same. Nodule within the lateral right upper lobe measures 0.9 by 0.6 cm, image 13/3. Unchanged. The left lower lobe central lung mass  measures 2.9 cm, image 41/3. Previously 2.1 cm. Peripheral wedge-shaped area of subsegmental atelectasis within the posterolateral left lower lobe is unchanged. Decrease in previous left pleural effusion. Moderate change of emphysema. Upper Abdomen: No acute abnormality. Musculoskeletal: No chest wall mass or suspicious bone lesions identified. IMPRESSION: 1. Left lower lobe central lung mass is slightly increased in size compared with the previous exam. 2. Stable treated tumor within the right upper lobe. 3. Stable  appearance of borderline right paratracheal and right hilar adenopathy. 4. Aortic Atherosclerosis (ICD10-I70.0) and Emphysema (ICD10-J43.9). Electronically Signed   By: Kerby Moors M.D.   On: 10/11/2018 14:10     Assessment and plan- Patient is a 53 y.o. male with a history of stage IIIb likely lung adenocarcinoma T3 N2 M0 presenting as SVC syndrome.  New status.  Concurrent chemoradiation with 4 cycles of cisplatin and Alimta.  He developed pericardial effusion after 1 cycle of durvalumab and therefore was not rechallenged.  He now has progression of disease in his left lung as well as right acetabular metastases.  He is here to discuss further treatment options.  1.  Patient is currently undergoing palliative radiation treatment and will not be done until next week.  I will therefore hold off on starting any chemotherapy at this time.  We also got the results of the amnesic testing back which did not show any actionable mutations.  His PDL 1 is less than 1% and given his prior pericardial effusion I would not be continuing with immunotherapy at this time.  Initially I had consider docetaxel a second line treatment but given that patient is young and had some response to initial treatment with cisplatin and Alimta, I would like to offer second line treatment with carboplatin and Taxol along with a Avastin for 4 cycles with Neulasta support.  Discussed risks and benefits of treatment including  all but not limited to fatigue, low blood counts, risk of infections and hospitalization.  Risk of peripheral neuropathy and infusion reaction associated with Taxol.  Treatment will be given with a palliative intent.  Patient understands and agrees to proceed  2.  I will tentatively start treatment in 2 weeks time.  Patient is tachycardic today and also has an elevated creatinine of 1.4 and mild hyponatremia likely secondary to poor oral intake.  I will give him 1 L of IV fluids today  3.  Neoplasm related right hip pain: Currently undergoing palliative radiation therapy and he will continue PRN oxycodone.  He will also receive his first dose of Xgeva today.  We did get in touch with his dentist Dr. Dara Lords to see if we could proceed with bisphosphonates given his bone metastases.  Dr. Dara Lords responded stating that his 3 teeth may need restorations in the future but currently looks good and if bisphosphonates would be beneficial to him then risk benefits would outweigh the risks.  4.  History of SVC syndrome: Continue Eliquis   Visit Diagnosis 1. Goals of care, counseling/discussion   2. Malignant neoplasm of upper lobe of right lung [facility])      Dr. Randa Evens, MD, MPH [facility] [phone] 10/21/2018 1:47 PM

## 2018-10-21 NOTE — Progress Notes (Signed)
Pt appetite has not been good because of pain in hip but it is better today and he is taking radiation and he is little better. Appetite better this week. No pain at this time on this appt. Bowels working ok, pt takes miralax 1-2 times a week

## 2018-10-22 ENCOUNTER — Other Ambulatory Visit: Payer: Self-pay

## 2018-10-22 ENCOUNTER — Ambulatory Visit
Admission: RE | Admit: 2018-10-22 | Discharge: 2018-10-22 | Disposition: A | Discharge status: Home / Self Care | Payer: Medicaid Other | Source: Ambulatory Visit | Attending: Radiation Oncology | Admitting: Radiation Oncology

## 2018-10-22 DIAGNOSIS — Z510 Encounter for antineoplastic radiation therapy: Secondary | ICD-10-CM | POA: Diagnosis not present

## 2018-10-25 ENCOUNTER — Ambulatory Visit
Admission: RE | Admit: 2018-10-25 | Discharge: 2018-10-25 | Disposition: A | Discharge status: Home / Self Care | Payer: Medicaid Other | Source: Ambulatory Visit | Attending: Radiation Oncology | Admitting: Radiation Oncology

## 2018-10-25 ENCOUNTER — Other Ambulatory Visit: Payer: Self-pay

## 2018-10-25 DIAGNOSIS — C3491 Malignant neoplasm of unspecified part of right bronchus or lung: Secondary | ICD-10-CM | POA: Diagnosis not present

## 2018-10-25 DIAGNOSIS — Z510 Encounter for antineoplastic radiation therapy: Secondary | ICD-10-CM | POA: Diagnosis not present

## 2018-10-26 ENCOUNTER — Other Ambulatory Visit: Payer: Self-pay

## 2018-10-26 ENCOUNTER — Ambulatory Visit
Admission: RE | Admit: 2018-10-26 | Discharge: 2018-10-26 | Disposition: A | Discharge status: Home / Self Care | Payer: Medicaid Other | Source: Ambulatory Visit | Attending: Radiation Oncology | Admitting: Radiation Oncology

## 2018-10-26 DIAGNOSIS — Z510 Encounter for antineoplastic radiation therapy: Secondary | ICD-10-CM | POA: Diagnosis not present

## 2018-10-27 ENCOUNTER — Ambulatory Visit
Admission: RE | Admit: 2018-10-27 | Discharge: 2018-10-27 | Disposition: A | Discharge status: Home / Self Care | Payer: Medicaid Other | Source: Ambulatory Visit | Attending: Radiation Oncology | Admitting: Radiation Oncology

## 2018-10-27 ENCOUNTER — Other Ambulatory Visit: Payer: Self-pay

## 2018-10-27 ENCOUNTER — Ambulatory Visit: Payer: Self-pay | Admitting: Radiation Oncology

## 2018-10-27 DIAGNOSIS — Z510 Encounter for antineoplastic radiation therapy: Secondary | ICD-10-CM | POA: Diagnosis not present

## 2018-10-28 ENCOUNTER — Ambulatory Visit
Admission: RE | Admit: 2018-10-28 | Discharge: 2018-10-28 | Disposition: A | Discharge status: Home / Self Care | Payer: Medicaid Other | Source: Ambulatory Visit | Attending: Radiation Oncology | Admitting: Radiation Oncology

## 2018-10-28 ENCOUNTER — Other Ambulatory Visit: Payer: Self-pay

## 2018-10-28 DIAGNOSIS — Z510 Encounter for antineoplastic radiation therapy: Secondary | ICD-10-CM | POA: Diagnosis not present

## 2018-10-29 ENCOUNTER — Other Ambulatory Visit: Payer: Self-pay

## 2018-10-29 ENCOUNTER — Ambulatory Visit
Admission: RE | Admit: 2018-10-29 | Discharge: 2018-10-29 | Disposition: A | Discharge status: Home / Self Care | Payer: Medicaid Other | Source: Ambulatory Visit | Attending: Radiation Oncology | Admitting: Radiation Oncology

## 2018-10-29 DIAGNOSIS — Z510 Encounter for antineoplastic radiation therapy: Secondary | ICD-10-CM | POA: Diagnosis not present

## 2018-11-01 ENCOUNTER — Other Ambulatory Visit: Payer: Self-pay | Admitting: *Deleted

## 2018-11-01 ENCOUNTER — Ambulatory Visit
Admission: RE | Admit: 2018-11-01 | Discharge: 2018-11-01 | Disposition: A | Discharge status: Home / Self Care | Payer: Medicaid Other | Source: Ambulatory Visit | Attending: Radiation Oncology | Admitting: Radiation Oncology

## 2018-11-01 ENCOUNTER — Other Ambulatory Visit: Payer: Self-pay

## 2018-11-01 DIAGNOSIS — C3411 Malignant neoplasm of upper lobe, right bronchus or lung: Secondary | ICD-10-CM

## 2018-11-01 DIAGNOSIS — C7951 Secondary malignant neoplasm of bone: Secondary | ICD-10-CM

## 2018-11-01 DIAGNOSIS — Z510 Encounter for antineoplastic radiation therapy: Secondary | ICD-10-CM | POA: Diagnosis not present

## 2018-11-02 ENCOUNTER — Ambulatory Visit: Payer: Self-pay | Admitting: Oncology

## 2018-11-02 ENCOUNTER — Inpatient Hospital Stay (HOSPITAL_BASED_OUTPATIENT_CLINIC_OR_DEPARTMENT_OTHER): Payer: Medicaid Other | Admitting: Oncology

## 2018-11-02 ENCOUNTER — Inpatient Hospital Stay: Payer: Medicaid Other | Attending: Oncology

## 2018-11-02 ENCOUNTER — Ambulatory Visit: Payer: Self-pay

## 2018-11-02 ENCOUNTER — Other Ambulatory Visit: Payer: Self-pay

## 2018-11-02 ENCOUNTER — Encounter: Payer: Self-pay | Admitting: Oncology

## 2018-11-02 ENCOUNTER — Inpatient Hospital Stay: Payer: Medicaid Other

## 2018-11-02 VITALS — BP 107/73 | HR 98 | Temp 97.9°F | Resp 18 | Ht 69.0 in | Wt 121.2 lb

## 2018-11-02 VITALS — BP 92/58 | HR 83 | Temp 96.8°F | Resp 18

## 2018-11-02 DIAGNOSIS — Z5189 Encounter for other specified aftercare: Secondary | ICD-10-CM | POA: Insufficient documentation

## 2018-11-02 DIAGNOSIS — Z5112 Encounter for antineoplastic immunotherapy: Secondary | ICD-10-CM

## 2018-11-02 DIAGNOSIS — D638 Anemia in other chronic diseases classified elsewhere: Secondary | ICD-10-CM

## 2018-11-02 DIAGNOSIS — D649 Anemia, unspecified: Secondary | ICD-10-CM | POA: Insufficient documentation

## 2018-11-02 DIAGNOSIS — I871 Compression of vein: Secondary | ICD-10-CM | POA: Insufficient documentation

## 2018-11-02 DIAGNOSIS — C7951 Secondary malignant neoplasm of bone: Secondary | ICD-10-CM

## 2018-11-02 DIAGNOSIS — R000 Tachycardia, unspecified: Secondary | ICD-10-CM

## 2018-11-02 DIAGNOSIS — I313 Pericardial effusion (noninflammatory): Secondary | ICD-10-CM

## 2018-11-02 DIAGNOSIS — Z5111 Encounter for antineoplastic chemotherapy: Secondary | ICD-10-CM

## 2018-11-02 DIAGNOSIS — C3411 Malignant neoplasm of upper lobe, right bronchus or lung: Secondary | ICD-10-CM | POA: Insufficient documentation

## 2018-11-02 DIAGNOSIS — G893 Neoplasm related pain (acute) (chronic): Secondary | ICD-10-CM

## 2018-11-02 DIAGNOSIS — Z7901 Long term (current) use of anticoagulants: Secondary | ICD-10-CM | POA: Insufficient documentation

## 2018-11-02 DIAGNOSIS — C3491 Malignant neoplasm of unspecified part of right bronchus or lung: Secondary | ICD-10-CM

## 2018-11-02 LAB — CBC WITH DIFFERENTIAL/PLATELET
Abs Immature Granulocytes: 0.04 10*3/uL (ref 0.00–0.07)
Basophils Absolute: 0 10*3/uL (ref 0.0–0.1)
Basophils Relative: 0 %
Eosinophils Absolute: 0 10*3/uL (ref 0.0–0.5)
Eosinophils Relative: 0 %
HCT: 29.4 % — ABNORMAL LOW (ref 39.0–52.0)
Hemoglobin: 9 g/dL — ABNORMAL LOW (ref 13.0–17.0)
Immature Granulocytes: 0 %
Lymphocytes Relative: 5 %
Lymphs Abs: 0.5 10*3/uL — ABNORMAL LOW (ref 0.7–4.0)
MCH: 26.8 pg (ref 26.0–34.0)
MCHC: 30.6 g/dL (ref 30.0–36.0)
MCV: 87.5 fL (ref 80.0–100.0)
Monocytes Absolute: 0.7 10*3/uL (ref 0.1–1.0)
Monocytes Relative: 7 %
Neutro Abs: 9.1 10*3/uL — ABNORMAL HIGH (ref 1.7–7.7)
Neutrophils Relative %: 88 %
Platelets: 248 10*3/uL (ref 150–400)
RBC: 3.36 MIL/uL — ABNORMAL LOW (ref 4.22–5.81)
RDW: 15.4 % (ref 11.5–15.5)
WBC: 10.5 10*3/uL (ref 4.0–10.5)
nRBC: 0 % (ref 0.0–0.2)

## 2018-11-02 LAB — COMPREHENSIVE METABOLIC PANEL
ALT: 10 U/L (ref 0–44)
AST: 14 U/L — ABNORMAL LOW (ref 15–41)
Albumin: 3.1 g/dL — ABNORMAL LOW (ref 3.5–5.0)
Alkaline Phosphatase: 111 U/L (ref 38–126)
Anion gap: 7 (ref 5–15)
BUN: 12 mg/dL (ref 6–20)
CO2: 26 mmol/L (ref 22–32)
Calcium: 7.6 mg/dL — ABNORMAL LOW (ref 8.9–10.3)
Chloride: 103 mmol/L (ref 98–111)
Creatinine, Ser: 0.94 mg/dL (ref 0.61–1.24)
GFR calc Af Amer: >60 mL/min (ref >60)
GFR calc non Af Amer: >60 mL/min (ref >60)
Glucose, Bld: 108 mg/dL — ABNORMAL HIGH (ref 70–99)
Potassium: 4.1 mmol/L (ref 3.5–5.1)
Sodium: 136 mmol/L (ref 135–145)
Total Bilirubin: 0.6 mg/dL (ref 0.3–1.2)
Total Protein: 7.3 g/dL (ref 6.5–8.1)

## 2018-11-02 LAB — PROTEIN, URINE, RANDOM: Total Protein, Urine: 71 mg/dL

## 2018-11-02 MED ORDER — SODIUM CHLORIDE 0.9 % IV SOLN
507.0000 mg | Freq: Once | INTRAVENOUS | Status: AC
  Administered 2018-11-02: 510 mg via INTRAVENOUS
  Filled 2018-11-02: qty 51

## 2018-11-02 MED ORDER — OXYCODONE HCL 10 MG PO TABS: 10.0000 mg | ORAL_TABLET | ORAL | 0 refills | Status: DC | PRN

## 2018-11-02 MED ORDER — SODIUM CHLORIDE 0.9 % IV SOLN
Freq: Once | INTRAVENOUS | Status: AC
  Administered 2018-11-02: 10:00:00 via INTRAVENOUS
  Filled 2018-11-02: qty 5

## 2018-11-02 MED ORDER — FAMOTIDINE IN NACL 20-0.9 MG/50ML-% IV SOLN
20.0000 mg | Freq: Once | INTRAVENOUS | Status: AC
  Administered 2018-11-02: 20 mg via INTRAVENOUS
  Filled 2018-11-02: qty 50

## 2018-11-02 MED ORDER — SODIUM CHLORIDE 0.9 % IV SOLN
Freq: Once | INTRAVENOUS | Status: AC
  Administered 2018-11-02: 10:00:00 via INTRAVENOUS
  Filled 2018-11-02: qty 250

## 2018-11-02 MED ORDER — DEXAMETHASONE 4 MG PO TABS: 8.0000 mg | ORAL_TABLET | Freq: Every day | ORAL | 1 refills | Status: DC

## 2018-11-02 MED ORDER — HEPARIN SOD (PORK) LOCK FLUSH 100 UNIT/ML IV SOLN
500.0000 [IU] | Freq: Once | INTRAVENOUS | Status: AC | PRN
  Administered 2018-11-02: 500 [IU]

## 2018-11-02 MED ORDER — DIPHENHYDRAMINE HCL 50 MG/ML IJ SOLN
50.0000 mg | Freq: Once | INTRAMUSCULAR | Status: AC
  Administered 2018-11-02: 50 mg via INTRAVENOUS
  Filled 2018-11-02: qty 1

## 2018-11-02 MED ORDER — LIDOCAINE-PRILOCAINE 2.5-2.5 % EX CREA: TOPICAL_CREAM | CUTANEOUS | 3 refills | Status: AC

## 2018-11-02 MED ORDER — PEGFILGRASTIM 6 MG/0.6ML ~~LOC~~ PSKT
6.0000 mg | PREFILLED_SYRINGE | Freq: Once | SUBCUTANEOUS | Status: AC
  Administered 2018-11-02: 6 mg via SUBCUTANEOUS
  Filled 2018-11-02: qty 0.6

## 2018-11-02 MED ORDER — ONDANSETRON HCL 8 MG PO TABS: 8.0000 mg | ORAL_TABLET | Freq: Two times a day (BID) | ORAL | 1 refills | Status: AC | PRN

## 2018-11-02 MED ORDER — HEPARIN SOD (PORK) LOCK FLUSH 100 UNIT/ML IV SOLN
500.0000 [IU] | Freq: Once | INTRAVENOUS | Status: DC
  Filled 2018-11-02: qty 5

## 2018-11-02 MED ORDER — PROCHLORPERAZINE MALEATE 10 MG PO TABS: 10.0000 mg | ORAL_TABLET | Freq: Four times a day (QID) | ORAL | 1 refills | Status: AC | PRN

## 2018-11-02 MED ORDER — SODIUM CHLORIDE 0.9 % IV SOLN
175.0000 mg/m2 | Freq: Once | INTRAVENOUS | Status: AC
  Administered 2018-11-02: 300 mg via INTRAVENOUS
  Filled 2018-11-02: qty 50

## 2018-11-02 MED ORDER — SODIUM CHLORIDE 0.9% FLUSH
10.0000 mL | INTRAVENOUS | Status: DC | PRN
  Administered 2018-11-02: 10 mL via INTRAVENOUS
  Filled 2018-11-02: qty 10

## 2018-11-02 MED ORDER — SODIUM CHLORIDE 0.9 % IV SOLN
13.4000 mg/kg | Freq: Once | INTRAVENOUS | Status: AC
  Administered 2018-11-02: 800 mg via INTRAVENOUS
  Filled 2018-11-02: qty 32

## 2018-11-02 MED ORDER — PALONOSETRON HCL INJECTION 0.25 MG/5ML
0.2500 mg | Freq: Once | INTRAVENOUS | Status: AC
  Administered 2018-11-02: 0.25 mg via INTRAVENOUS
  Filled 2018-11-02: qty 5

## 2018-11-02 NOTE — Progress Notes (Signed)
Pt eating and drinking good. Bowels good and sometimes uses miralax and no diarrhea. stil having pain but does not need the pain pill except maybe 4 times a day. Right now no pain

## 2018-11-02 NOTE — Progress Notes (Signed)
Hematology/Oncology Consult note [facility]  [phone] [phone]  Patient Care Team: Patient, No Pcp Per as PCP - General ([place]) Telford Nab, RN as Registered Nurse   Name of the patient: Alexander [name]  [id number]  19-Nov-1965   Date of visit: 11/02/18  Diagnosis-  lung adenocarcinoma Stage IIIB cT3 cN2 cM0 now with right acetabular metastases and progression of disease in the left lung   Chief complaint/ Reason for visit-on treatment assessment prior to cycle 1 of carboplatin Taxol and Avastin  Heme/Onc history: patient is a 53 year old male with a long-standing history of smoking. He has smoked 1 to 1/2 pack of cigarettes per day for over 30 years. He presented to outside urgent care with some symptoms of shortness of breath and was treated for URI. He subsequently presented with facial and neck swelling which was thought by outside ER to be secondary to a drug reaction. Following that patient came to ER here at [facility]  He underwent CT chest abdomen and pelvis which showed 2.1 x 2 cm irregular mass in the right upper lobe concerning for malignancy.. Multiple collateral veins in chest and abdomen probable thrombosis of the left brachiocephalic vein. This is consistent with IVC syndrome Probable right paratracheal right hilar and precarinal adenopathy resulting in severe narrowing and thrombosis of SVC. This is consistent with SVC syndrome  Patient was seen by vascular surgery and there was no acute need for any endovascular therapies. He is on anticoagulation with Coumadin which has been subsequently changed to Eliquis as an outpatient. Patient initially underwent a bronchoscopy guided biopsy of the mediastinal lymph nodes which was inconclusive and underwent a second IR guided biopsy of the right upper lobe lung mass which was consistent with adenocarcinoma  Patient started radiation treatment on 03/15/2018.  Plan is to give 4 cycles of cisplatin and Alimta followed by maintenance durvalumab. Cycle 1 of cisplatin Alimta given on 03/16/2018.Patient completed 4 cycles of cisplatin and Alimta along with concurrent radiation on 05/20/2018. Scan showed partial response. Cycle 1 of durvalumab given on 06/17/2018. Patient presented to the ER with hypotension tachycardia and was found to have pericardial effusion 10 days after first dose of durvalumab. Fluid negative for malignancy  Patient was empirically treated for pericardial effusion probably secondary to immunotherapy versus radiation and completed 1 month of steroids. Repeat echocardiogram was normal. PDL 1 testing on his initial tumor specimen showed expression of less than 1%.  Scans in May 2020 showed progression of disease in the left lung as well as new right acetabular metastases.  Patient received palliative radiation for his bone mets.   Interval history-right hip pain is better since radiation.  He is using 3-4 doses of oxycodone a day.  Appetite is fair.  Weight has remained stable over the last 2 weeks but he did lose 10 pounds over the last 1 month.  Denies other complaints at this time  ECOG PS- 1 Pain scale- 3 Opioid associated constipation- no  Review of systems- Review of Systems  Constitutional: Positive for malaise/fatigue. Negative for chills, fever and weight loss.  HENT: Negative for congestion, ear discharge and nosebleeds.   Eyes: Negative for blurred vision.  Respiratory: Negative for cough, hemoptysis, sputum production, shortness of breath and wheezing.   Cardiovascular: Negative for chest pain, palpitations, orthopnea and claudication.  Gastrointestinal: Negative for abdominal pain, blood in stool, constipation, diarrhea, heartburn, melena, nausea and vomiting.  Genitourinary: Negative for dysuria, flank pain, frequency, hematuria and urgency.  Musculoskeletal: Positive for joint pain (Right hip pain). Negative  for back pain and myalgias.  Skin: Negative for rash.  Neurological: Negative for dizziness, tingling, focal weakness, seizures, weakness and headaches.  Endo/Heme/Allergies: Does not bruise/bleed easily.  Psychiatric/Behavioral: Negative for depression and suicidal ideas. The patient does not have insomnia.        Allergies  Allergen Reactions   Doxycycline Swelling   Penicillins Swelling    Did it involve swelling of the face/tongue/throat, SOB, or low BP? Unknown Did it involve sudden or severe rash/hives, skin peeling, or any reaction on the inside of your mouth or nose? Unknown Did you need to seek medical attention at a hospital or doctor's office? Unknown When did it last happen?Childhood If all above answers are NO, may proceed with cephalosporin use.     Past Medical History:  Diagnosis Date   Cancer ([place])    COPD (chronic obstructive pulmonary disease) ([place])    Lung mass      Past Surgical History:  Procedure Laterality Date   ELECTROMAGNETIC NAVIGATION BROCHOSCOPY Left 10/13/2018   Procedure: ELECTROMAGNETIC NAVIGATION BRONCHOSCOPY - LEFT;  Surgeon: Tyler Pita, MD;  Location: [facility];  Service: Cardiopulmonary;  Laterality: Left;   ENDOBRONCHIAL ULTRASOUND N/A 03/11/2018   Procedure: ENDOBRONCHIAL ULTRASOUND;  Surgeon: Tyler Pita, MD;  Location: [facility];  Service: Cardiopulmonary;  Laterality: N/A;   ENDOBRONCHIAL ULTRASOUND Left 10/13/2018   Procedure: ENDOBRONCHIAL ULTRASOUND - LEFT;  Surgeon: Tyler Pita, MD;  Location: [facility];  Service: Cardiopulmonary;  Laterality: Left;   none     PORTA CATH INSERTION N/A 03/12/2018   Procedure: PORTA CATH INSERTION, thigh;  Surgeon: Katha Cabal, MD;  Location: [place] [facility];  Service: Cardiovascular;  Laterality: N/A;   SUBXYPHOID PERICARDIAL WINDOW N/A 06/30/2018   Procedure: SUBXYPHOID PERICARDIAL WINDOW;  Surgeon: [name], MD;  Location: [place];  Service:  Open Heart Surgery;  Laterality: N/A;   TEE WITHOUT CARDIOVERSION N/A 06/30/2018   Procedure: TRANSESOPHAGEAL ECHOCARDIOGRAM (TEE);  Surgeon: [name], MD;  Location: [place];  Service: Open Heart Surgery;  Laterality: N/A;    Social History   Socioeconomic History   Marital status: Divorced    Spouse name: Not on file   Number of children: Not on file   Years of education: Not on file   Highest education level: Not on file  Occupational History   Occupation: Cabin crew  Social Needs   Financial resource strain: Not on file   Food insecurity:    Worry: Not on file    Inability: Not on file   Transportation needs:    Medical: Not on file    Non-medical: Not on file  Tobacco Use   Smoking status: Current Every Day Smoker    Packs/day: 0.25   Smokeless tobacco: Never Used   Tobacco comment: 4-5 cig a day  Substance and Sexual Activity   Alcohol use: Not Currently   Drug use: Not Currently    Types: Marijuana   Sexual activity: Not Currently  Lifestyle   Physical activity:    Days per week: Not on file    Minutes per session: Not on file   Stress: Not on file  Relationships   Social connections:    Talks on phone: Not on file    Gets together: Not on file    Attends religious service: Not on file    Active member of club or organization: Not on file  Attends meetings of clubs or organizations: Not on file    Relationship status: Not on file   Intimate partner violence:    Fear of current or ex partner: Not on file    Emotionally abused: Not on file    Physically abused: Not on file    Forced sexual activity: Not on file  Other Topics Concern   Not on file  Social History Narrative   Not on file    No family history on file.   Current Outpatient Medications:    apixaban (ELIQUIS) 5 MG TABS tablet, Take 1 tablet (5 mg total) by mouth 2 (two) times daily for 30 days., Disp: 60 tablet, Rfl: 3   Ipratropium-Albuterol (COMBIVENT  RESPIMAT) 20-100 MCG/ACT AERS respimat, Inhale 1 puff into the lungs every 6 (six) hours as needed for wheezing or shortness of breath., Disp: 1 Inhaler, Rfl: 2   Oxycodone HCl 10 MG TABS, Take 1 tablet (10 mg total) by mouth every 4 (four) hours as needed., Disp: 120 tablet, Rfl: 0   polyethylene glycol (MIRALAX / GLYCOLAX) 17 g packet, Take 17 g by mouth 2 (two) times a week., Disp: , Rfl:   Physical exam: There were no vitals filed for this visit. Physical Exam Constitutional:      Comments: Patient is thin and appears fatigued  HENT:     Head: Normocephalic and atraumatic.  Eyes:     Pupils: Pupils are equal, round, and reactive to light.  Neck:     Musculoskeletal: Normal range of motion.     Comments: Distended neck veins Cardiovascular:     Rate and Rhythm: Normal rate and regular rhythm.     Heart sounds: Normal heart sounds.  Pulmonary:     Effort: Pulmonary effort is normal.     Breath sounds: Normal breath sounds.  Abdominal:     General: Bowel sounds are normal.     Palpations: Abdomen is soft.     Comments: Distended abdominal veins  Skin:    General: Skin is warm and dry.  Neurological:     Mental Status: He is alert and oriented to person, place, and time.      CMP Latest Ref Rng & Units 10/21/2018  Glucose 70 - 99 mg/dL 97  BUN 6 - 20 mg/dL 14  Creatinine 0.61 - 1.24 mg/dL 1.41(H)  Sodium 135 - 145 mmol/L 134(L)  Potassium 3.5 - 5.1 mmol/L 3.4(L)  Chloride 98 - 111 mmol/L 94(L)  CO2 22 - 32 mmol/L 29  Calcium 8.9 - 10.3 mg/dL 8.9  Total Protein 6.5 - 8.1 g/dL 7.4  Total Bilirubin 0.3 - 1.2 mg/dL 0.5  Alkaline Phos 38 - 126 U/L 170(H)  AST 15 - 41 U/L 25  ALT 0 - 44 U/L 14   CBC Latest Ref Rng & Units 10/21/2018  WBC 4.0 - 10.5 K/uL 10.4  Hemoglobin 13.0 - 17.0 g/dL 9.2(L)  Hematocrit 39.0 - 52.0 % 29.2(L)  Platelets 150 - 400 K/uL 246    No images are attached to the encounter.  Dg Chest Port 1 View  Result Date: 10/13/2018 CLINICAL DATA:   Status post bronchoscopy EXAM: PORTABLE CHEST 1 VIEW COMPARISON:  CT chest 10/11/2018 FINDINGS: Trace right apical pneumothorax. Lungs are clear. No pleural effusion. Normal cardiomediastinal contours. IMPRESSION: Trace right apical pneumothorax. Electronically Signed   By: Ulyses Jarred M.D.   On: 10/13/2018 15:50   Dg Fluoro Rm 1-60 Min - No Report  Result Date: 10/13/2018 CLINICAL DATA:  Status post bronchoscopy EXAM: PORTABLE CHEST 1 VIEW COMPARISON:  CT chest 10/11/2018 FINDINGS: Trace right apical pneumothorax. Lungs are clear. No pleural effusion. Normal cardiomediastinal contours. IMPRESSION: Trace right apical pneumothorax. Electronically Signed   By: Ulyses Jarred M.D.   On: 10/13/2018 15:50   Dg Femur, Min 2 Views Right  Result Date: 10/06/2018 CLINICAL DATA:  Right anterior hip pain history of metastatic lung cancer EXAM: RIGHT FEMUR 2 VIEWS COMPARISON:  09/28/2018 FINDINGS: Intact right femur. Negative for fracture. Right femoral power port catheter noted. No subluxation or dislocation. Included views of the right hemipelvis demonstrate no displaced fracture. Known right acetabular metastatic lesion by PET-CT is not well appreciated by plain radiography. Peripheral atherosclerosis noted. IMPRESSION: No acute osseous finding by plain radiography.  See above comment. Electronically Signed   By: Jerilynn Mages.  Shick M.D.   On: 10/06/2018 13:29   Ct Super D Chest Wo Contrast  Result Date: 10/11/2018 CLINICAL DATA:  Mass in left lung.  Scheduled for bronchoscopy. EXAM: CT CHEST WITHOUT CONTRAST TECHNIQUE: Multidetector CT imaging of the chest was performed using thin slice collimation for electromagnetic bronchoscopy planning purposes, without intravenous contrast. COMPARISON:  09/28/2018 FINDINGS: Cardiovascular: Aortic atherosclerosis. Mild pericardial thickening versus effusion. Similar. Mediastinum/Nodes: Normal appearance of the thyroid gland. The trachea appears patent and is midline. -There is  ill-defined soft tissue infiltration within the mediastinum which appears similar to 09/25/2018. -index right paratracheal lymph node is stable measuring 1.6 cm. -index pre-vascular node measures 0.7 cm, image 27/2. -Index right hilar lymph node measures 1.3 cm, image 31/2. Previously 1.5 cm. Lungs/Pleura: No pleural effusion identified. The treated lesion within the medial aspect of the right apex measures 1.5 by 1.3 cm, image 11/2. Previously this measured the same. Nodule within the lateral right upper lobe measures 0.9 by 0.6 cm, image 13/3. Unchanged. The left lower lobe central lung mass measures 2.9 cm, image 41/3. Previously 2.1 cm. Peripheral wedge-shaped area of subsegmental atelectasis within the posterolateral left lower lobe is unchanged. Decrease in previous left pleural effusion. Moderate change of emphysema. Upper Abdomen: No acute abnormality. Musculoskeletal: No chest wall mass or suspicious bone lesions identified. IMPRESSION: 1. Left lower lobe central lung mass is slightly increased in size compared with the previous exam. 2. Stable treated tumor within the right upper lobe. 3. Stable appearance of borderline right paratracheal and right hilar adenopathy. 4. Aortic Atherosclerosis (ICD10-I70.0) and Emphysema (ICD10-J43.9). Electronically Signed   By: Kerby Moors M.D.   On: 10/11/2018 14:10     Assessment and plan- Patient is a 53 y.o. male history of stage IIIb likely lung adenocarcinoma T3 N2 M0 presenting as SVC syndrome.  He is status post concurrent chemoradiation with cisplatin and Alimta.  He developed pericardial effusion after 1 dose of durvalumab and was not rechallenged.  Scans in May 2020 showed progression of disease in the left lung as well as right acetabular metastases.  He is s/p palliative radiation to his right hip and is here for on treatment assessment prior to cycle 1 of carboplatin Taxol and Avastin  Again discussed risks and benefits of chemotherapy including all  but not limited to nausea, vomiting, fatigue, low blood counts, risk of infections and hospitalization.  Risk of peripheral neuropathy and infusion reaction associated with Taxol.  Treatment will be given with a palliative intent.  Risk of proteinuria, leg swelling and hypertension with a Avastin.    Counts okay to proceed with cycle 1 of carboplatin Taxol and Avastin today.  He would be  receiving carboplatin and AUC 5 and Taxol at 175 mg per metered square.  He will also receive 1 for Neulasta with this cycle.  I recommended that he should try Tylenol and Claritin for Neulasta associated bone pain if he develops that.  Normocytic anemia: Likely secondary to malignancy.  I will check iron studies B12 folate and reticulocyte count at next visit.  I have refilled his oxycodone for right hip pain.  I will consider adding a long-acting pain medication if he continues to need 4 doses of oxycodone daily at the next visit.  I will see him back in 3 weeks time on 11/23/2018 with CBC with differential, CMP and urine protein for cycle 2 of carboplatin Taxol and Avastin.  He will also receive Xgeva on that day.  Plan to repeat scans after 2 cycles  Patient knows to call if he develops any worsening side effects from chemotherapy and unable to keep up his oral intake for possible IV fluids in between his 2 cycles.   Visit Diagnosis 1. Encounter for antineoplastic chemotherapy   2. Adenocarcinoma, lung, right ([place])   3. Bone metastases ([place])   4. Neoplasm related pain      Dr. Randa Evens, MD, MPH [facility] at [facility] [phone] 11/02/2018 9:38 AM

## 2018-11-08 ENCOUNTER — Encounter: Payer: Self-pay | Admitting: Pharmacy Technician

## 2018-11-08 NOTE — Progress Notes (Signed)
Patient no longer getting  Neulasta and Xgeva from [employer] or Avastin from [place] based on [place]. Last DOS covered is 5/28=Xgeva and 6/9 for Neulasta and Avastin.

## 2018-11-22 ENCOUNTER — Other Ambulatory Visit: Payer: Self-pay

## 2018-11-22 ENCOUNTER — Telehealth: Payer: Self-pay | Admitting: Oncology

## 2018-11-22 ENCOUNTER — Other Ambulatory Visit: Payer: Self-pay | Admitting: *Deleted

## 2018-11-22 DIAGNOSIS — C3411 Malignant neoplasm of upper lobe, right bronchus or lung: Secondary | ICD-10-CM

## 2018-11-22 NOTE — Telephone Encounter (Signed)
Spoke with pt to confirm appt date/time, do pre-appt screen which was completed, and adv of Covid-19 guidelines for appt regarding screening questions, temperature check, face mask required, and no visitors allowed °

## 2018-11-23 ENCOUNTER — Inpatient Hospital Stay (HOSPITAL_BASED_OUTPATIENT_CLINIC_OR_DEPARTMENT_OTHER): Payer: Medicaid Other | Admitting: Oncology

## 2018-11-23 ENCOUNTER — Other Ambulatory Visit: Payer: Self-pay

## 2018-11-23 ENCOUNTER — Inpatient Hospital Stay: Payer: Medicaid Other

## 2018-11-23 ENCOUNTER — Encounter: Payer: Self-pay | Admitting: Oncology

## 2018-11-23 VITALS — BP 105/69 | HR 117 | Temp 97.7°F | Resp 18 | Wt 114.5 lb

## 2018-11-23 DIAGNOSIS — Z5112 Encounter for antineoplastic immunotherapy: Secondary | ICD-10-CM | POA: Diagnosis not present

## 2018-11-23 DIAGNOSIS — Z7901 Long term (current) use of anticoagulants: Secondary | ICD-10-CM

## 2018-11-23 DIAGNOSIS — G893 Neoplasm related pain (acute) (chronic): Secondary | ICD-10-CM | POA: Diagnosis not present

## 2018-11-23 DIAGNOSIS — C7951 Secondary malignant neoplasm of bone: Secondary | ICD-10-CM

## 2018-11-23 DIAGNOSIS — Z5111 Encounter for antineoplastic chemotherapy: Secondary | ICD-10-CM

## 2018-11-23 DIAGNOSIS — C3411 Malignant neoplasm of upper lobe, right bronchus or lung: Secondary | ICD-10-CM

## 2018-11-23 DIAGNOSIS — I871 Compression of vein: Secondary | ICD-10-CM | POA: Diagnosis not present

## 2018-11-23 DIAGNOSIS — E860 Dehydration: Secondary | ICD-10-CM

## 2018-11-23 DIAGNOSIS — C3491 Malignant neoplasm of unspecified part of right bronchus or lung: Secondary | ICD-10-CM

## 2018-11-23 DIAGNOSIS — Z95828 Presence of other vascular implants and grafts: Secondary | ICD-10-CM

## 2018-11-23 LAB — CBC WITH DIFFERENTIAL/PLATELET
Abs Immature Granulocytes: 0.09 10*3/uL — ABNORMAL HIGH (ref 0.00–0.07)
Basophils Absolute: 0.1 10*3/uL (ref 0.0–0.1)
Basophils Relative: 0 %
Eosinophils Absolute: 0 10*3/uL (ref 0.0–0.5)
Eosinophils Relative: 0 %
HCT: 31.3 % — ABNORMAL LOW (ref 39.0–52.0)
Hemoglobin: 9.5 g/dL — ABNORMAL LOW (ref 13.0–17.0)
Immature Granulocytes: 1 %
Lymphocytes Relative: 5 %
Lymphs Abs: 0.6 10*3/uL — ABNORMAL LOW (ref 0.7–4.0)
MCH: 26 pg (ref 26.0–34.0)
MCHC: 30.4 g/dL (ref 30.0–36.0)
MCV: 85.8 fL (ref 80.0–100.0)
Monocytes Absolute: 0.8 10*3/uL (ref 0.1–1.0)
Monocytes Relative: 7 %
Neutro Abs: 11 10*3/uL — ABNORMAL HIGH (ref 1.7–7.7)
Neutrophils Relative %: 87 %
Platelets: 229 10*3/uL (ref 150–400)
RBC: 3.65 MIL/uL — ABNORMAL LOW (ref 4.22–5.81)
RDW: 16.8 % — ABNORMAL HIGH (ref 11.5–15.5)
WBC: 12.5 10*3/uL — ABNORMAL HIGH (ref 4.0–10.5)
nRBC: 0 % (ref 0.0–0.2)

## 2018-11-23 LAB — COMPREHENSIVE METABOLIC PANEL
ALT: 10 U/L (ref 0–44)
AST: 12 U/L — ABNORMAL LOW (ref 15–41)
Albumin: 3.2 g/dL — ABNORMAL LOW (ref 3.5–5.0)
Alkaline Phosphatase: 97 U/L (ref 38–126)
Anion gap: 10 (ref 5–15)
BUN: 10 mg/dL (ref 6–20)
CO2: 26 mmol/L (ref 22–32)
Calcium: 8.1 mg/dL — ABNORMAL LOW (ref 8.9–10.3)
Chloride: 99 mmol/L (ref 98–111)
Creatinine, Ser: 1.17 mg/dL (ref 0.61–1.24)
GFR calc Af Amer: >60 mL/min (ref >60)
GFR calc non Af Amer: >60 mL/min (ref >60)
Glucose, Bld: 144 mg/dL — ABNORMAL HIGH (ref 70–99)
Potassium: 3.9 mmol/L (ref 3.5–5.1)
Sodium: 135 mmol/L (ref 135–145)
Total Bilirubin: 0.4 mg/dL (ref 0.3–1.2)
Total Protein: 7.5 g/dL (ref 6.5–8.1)

## 2018-11-23 LAB — IRON AND TIBC
Iron: 20 ug/dL — ABNORMAL LOW (ref 45–182)
Saturation Ratios: 10 % — ABNORMAL LOW (ref 17.9–39.5)
TIBC: 200 ug/dL — ABNORMAL LOW (ref 250–450)
UIBC: 180 ug/dL

## 2018-11-23 LAB — RETICULOCYTES
Immature Retic Fract: 19.6 % — ABNORMAL HIGH (ref 2.3–15.9)
RBC.: 3.65 MIL/uL — ABNORMAL LOW (ref 4.22–5.81)
Retic Count, Absolute: 45.3 10*3/uL (ref 19.0–186.0)
Retic Ct Pct: 1.2 % (ref 0.4–3.1)

## 2018-11-23 LAB — FERRITIN: Ferritin: 806 ng/mL — ABNORMAL HIGH (ref 24–336)

## 2018-11-23 LAB — FOLATE: Folate: 7.5 ng/mL (ref >5.9)

## 2018-11-23 LAB — VITAMIN B12: Vitamin B-12: 1819 pg/mL — ABNORMAL HIGH (ref 180–914)

## 2018-11-23 LAB — PROTEIN, URINE, RANDOM: Total Protein, Urine: 39 mg/dL

## 2018-11-23 MED ORDER — PALONOSETRON HCL INJECTION 0.25 MG/5ML
0.2500 mg | Freq: Once | INTRAVENOUS | Status: AC
  Administered 2018-11-23: 0.25 mg via INTRAVENOUS
  Filled 2018-11-23: qty 5

## 2018-11-23 MED ORDER — HEPARIN SOD (PORK) LOCK FLUSH 100 UNIT/ML IV SOLN
500.0000 [IU] | Freq: Once | INTRAVENOUS | Status: AC
  Administered 2018-11-23: 500 [IU] via INTRAVENOUS
  Filled 2018-11-23: qty 5

## 2018-11-23 MED ORDER — DENOSUMAB 120 MG/1.7ML ~~LOC~~ SOLN
120.0000 mg | SUBCUTANEOUS | Status: DC
  Administered 2018-11-23: 120 mg via SUBCUTANEOUS
  Filled 2018-11-23: qty 1.7

## 2018-11-23 MED ORDER — SODIUM CHLORIDE 0.9 % IV SOLN
INTRAVENOUS | Status: AC
  Filled 2018-11-23: qty 250

## 2018-11-23 MED ORDER — SODIUM CHLORIDE 0.9 % IV SOLN
175.0000 mg/m2 | Freq: Once | INTRAVENOUS | Status: AC
  Administered 2018-11-23: 300 mg via INTRAVENOUS
  Filled 2018-11-23: qty 50

## 2018-11-23 MED ORDER — SODIUM CHLORIDE 0.9 % IV SOLN
13.5000 mg/kg | Freq: Once | INTRAVENOUS | Status: AC
  Administered 2018-11-23: 800 mg via INTRAVENOUS
  Filled 2018-11-23: qty 32

## 2018-11-23 MED ORDER — SODIUM CHLORIDE 0.9 % IV SOLN
Freq: Once | INTRAVENOUS | Status: AC
  Administered 2018-11-23: 10:00:00 via INTRAVENOUS
  Filled 2018-11-23: qty 5

## 2018-11-23 MED ORDER — HEPARIN SOD (PORK) LOCK FLUSH 100 UNIT/ML IV SOLN
500.0000 [IU] | Freq: Once | INTRAVENOUS | Status: AC | PRN
  Filled 2018-11-23 (×2): qty 5

## 2018-11-23 MED ORDER — SODIUM CHLORIDE 0.9 % IV SOLN
431.5000 mg | Freq: Once | INTRAVENOUS | Status: AC
  Administered 2018-11-23: 430 mg via INTRAVENOUS
  Filled 2018-11-23: qty 43

## 2018-11-23 MED ORDER — SODIUM CHLORIDE 0.9 % IV SOLN
Freq: Once | INTRAVENOUS | Status: AC
  Administered 2018-11-23: 10:00:00 via INTRAVENOUS
  Filled 2018-11-23: qty 250

## 2018-11-23 MED ORDER — PEGFILGRASTIM 6 MG/0.6ML ~~LOC~~ PSKT
6.0000 mg | PREFILLED_SYRINGE | Freq: Once | SUBCUTANEOUS | Status: AC
  Administered 2018-11-23: 6 mg via SUBCUTANEOUS
  Filled 2018-11-23: qty 0.6

## 2018-11-23 MED ORDER — SODIUM CHLORIDE 0.9% FLUSH
10.0000 mL | Freq: Once | INTRAVENOUS | Status: AC
  Filled 2018-11-23: qty 10

## 2018-11-23 MED ORDER — DIPHENHYDRAMINE HCL 50 MG/ML IJ SOLN
50.0000 mg | Freq: Once | INTRAMUSCULAR | Status: AC
  Administered 2018-11-23: 50 mg via INTRAVENOUS
  Filled 2018-11-23: qty 1

## 2018-11-23 MED ORDER — FAMOTIDINE IN NACL 20-0.9 MG/50ML-% IV SOLN
20.0000 mg | Freq: Once | INTRAVENOUS | Status: AC
  Administered 2018-11-23: 20 mg via INTRAVENOUS
  Filled 2018-11-23: qty 50

## 2018-11-23 NOTE — Progress Notes (Signed)
All parameters/labs reviewed by Dr. Janese Banks, per Dr. Janese Banks proceed with treatment today.

## 2018-11-23 NOTE — Progress Notes (Addendum)
Hematology/Oncology Consult note [facility]  Telephone:([phone] [phone]  Patient Care Team: Patient, No Pcp Per as PCP - General ([place]) Telford Nab, RN as Registered Nurse   Name of the patient: Alexander [name]  [id number]  06-Nov-1965   Date of visit: 11/23/18  Diagnosis- lung adenocarcinoma Stage IIIB cT3 cN2 cM0now with right acetabular metastases and progression of disease in the left lung   Chief complaint/ Reason for visit-on treatment assessment prior to cycle 2 of carboplatin Taxol and Avastin chemotherapy  Heme/Onc history: patient is a 53 year old male with a long-standing history of smoking. He has smoked 1 to 1/2 pack of cigarettes per day for over 30 years. He presented to outside urgent care with some symptoms of shortness of breath and was treated for URI. He subsequently presented with facial and neck swelling which was thought by outside ER to be secondary to a drug reaction. Following that patient came to ER here at [facility].  He underwent CT chest abdomen and pelvis which showed 2.1 x 2 cm irregular mass in the right upper lobe concerning for malignancy.. Multiple collateral veins in chest and abdomen probable thrombosis of the left brachiocephalic vein. This is consistent with IVC syndrome Probable right paratracheal right hilar and precarinal adenopathy resulting in severe narrowing and thrombosis of SVC. This is consistent with SVC syndrome  Patient was seen by vascular surgery and there was no acute need for any endovascular therapies. He is on anticoagulation with Coumadin which has been subsequently changed to Eliquis as an outpatient. Patient initially underwent a bronchoscopy guided biopsy of the mediastinal lymph nodes which was inconclusive and underwent a second IR guided biopsy of the right upper lobe lung mass which was consistent with adenocarcinoma  Patient started radiation treatment  on 03/15/2018. Plan is to give 4 cycles of cisplatin and Alimta followed by maintenance durvalumab. Cycle 1 of cisplatin Alimta given on 03/16/2018.Patient completed 4 cycles of cisplatin and Alimta along with concurrent radiation on 05/20/2018. Scan showed partial response. Cycle 1 of durvalumab given on 06/17/2018. Patient presented to the ER with hypotension tachycardia and was found to have pericardial effusion 10 days after first dose of durvalumab. Fluid negative for malignancy  Patient was empirically treated for pericardial effusion probably secondary to immunotherapy versus radiation and completed 1 month of steroids. Repeat echocardiogram was normal. PDL 1 testing on his initial tumor specimen showed expression of less than 1%.  Scans in May 2020 showed progression of disease in the left lung as well as new right acetabular metastases.  Patient received palliative radiation for his bone mets.  Carboplatin, Taxol and Avastin chemotherapy started on 11/02/2018  Interval history-appetite is still poor.  Food does not taste good.  He did lose another 7 pounds since his last visit which is concerning.  Right hip pain is well controlled and he is taking about 1 to do most of oxycodone he had.  He had significant fatigue for about 2 to 3 days after chemotherapy.  Denies any nausea or vomiting  ECOG PS- 1 Pain scale- 0 Opioid associated constipation- no  Review of systems- Review of Systems  Constitutional: Positive for malaise/fatigue and weight loss. Negative for chills and fever.  HENT: Negative for congestion, ear discharge and nosebleeds.   Eyes: Negative for blurred vision.  Respiratory: Negative for cough, hemoptysis, sputum production, shortness of breath and wheezing.   Cardiovascular: Negative for chest pain, palpitations, orthopnea and claudication.  Gastrointestinal: Negative for abdominal  pain, blood in stool, constipation, diarrhea, heartburn, melena, nausea and vomiting.   Genitourinary: Negative for dysuria, flank pain, frequency, hematuria and urgency.  Musculoskeletal: Negative for back pain, joint pain and myalgias.  Skin: Negative for rash.  Neurological: Negative for dizziness, tingling, focal weakness, seizures, weakness and headaches.  Endo/Heme/Allergies: Does not bruise/bleed easily.  Psychiatric/Behavioral: Negative for depression and suicidal ideas. The patient does not have insomnia.        Allergies  Allergen Reactions  . Doxycycline Swelling  . Penicillins Swelling    Did it involve swelling of the face/tongue/throat, SOB, or low BP? Unknown Did it involve sudden or severe rash/hives, skin peeling, or any reaction on the inside of your mouth or nose? Unknown Did you need to seek medical attention at a hospital or doctor's office? Unknown When did it last happen?Childhood If all above answers are "NO", may proceed with cephalosporin use.     Past Medical History:  Diagnosis Date  . Cancer ([place])   . COPD (chronic obstructive pulmonary disease) ([place])   . Lung mass      Past Surgical History:  Procedure Laterality Date  . ELECTROMAGNETIC NAVIGATION BROCHOSCOPY Left 10/13/2018   Procedure: ELECTROMAGNETIC NAVIGATION BRONCHOSCOPY - LEFT;  Surgeon: Tyler Pita, MD;  Location: [facility];  Service: Cardiopulmonary;  Laterality: Left;  . ENDOBRONCHIAL ULTRASOUND N/A 03/11/2018   Procedure: ENDOBRONCHIAL ULTRASOUND;  Surgeon: Tyler Pita, MD;  Location: [facility];  Service: Cardiopulmonary;  Laterality: N/A;  . ENDOBRONCHIAL ULTRASOUND Left 10/13/2018   Procedure: ENDOBRONCHIAL ULTRASOUND - LEFT;  Surgeon: Tyler Pita, MD;  Location: [facility];  Service: Cardiopulmonary;  Laterality: Left;  . none    . PORTA CATH INSERTION N/A 03/12/2018   Procedure: PORTA CATH INSERTION, thigh;  Surgeon: Katha Cabal, MD;  Location: [facility];  Service: Cardiovascular;  Laterality: N/A;  . SUBXYPHOID PERICARDIAL  WINDOW N/A 06/30/2018   Procedure: SUBXYPHOID PERICARDIAL WINDOW;  Surgeon: [name], MD;  Location: [place];  Service: Open Heart Surgery;  Laterality: N/A;  . TEE WITHOUT CARDIOVERSION N/A 06/30/2018   Procedure: TRANSESOPHAGEAL ECHOCARDIOGRAM (TEE);  Surgeon: [name], MD;  Location: [place];  Service: Open Heart Surgery;  Laterality: N/A;    Social History   Socioeconomic History  . Marital status: Divorced    Spouse name: Not on file  . Number of children: Not on file  . Years of education: Not on file  . Highest education level: Not on file  Occupational History  . Occupation: Cabin crew  Social Needs  . Financial resource strain: Not on file  . Food insecurity    Worry: Not on file    Inability: Not on file  . Transportation needs    Medical: Not on file    Non-medical: Not on file  Tobacco Use  . Smoking status: Current Every Day Smoker    Packs/day: 0.25  . Smokeless tobacco: Never Used  . Tobacco comment: 4-5 cig a day  Substance and Sexual Activity  . Alcohol use: Not Currently  . Drug use: Not Currently    Types: Marijuana  . Sexual activity: Not Currently  Lifestyle  . Physical activity    Days per week: Not on file    Minutes per session: Not on file  . Stress: Not on file  Relationships  . Social Herbalist on phone: Not on file    Gets together: Not on file    Attends religious service:  Not on file    Active member of club or organization: Not on file    Attends meetings of clubs or organizations: Not on file    Relationship status: Not on file  . Intimate partner violence    Fear of current or ex partner: Not on file    Emotionally abused: Not on file    Physically abused: Not on file    Forced sexual activity: Not on file  Other Topics Concern  . Not on file  Social History Narrative  . Not on file    No family history on file.   Current Outpatient Medications:  .  apixaban (ELIQUIS) 5 MG TABS tablet, Take 1  tablet (5 mg total) by mouth 2 (two) times daily for 30 days., Disp: 60 tablet, Rfl: 3 .  dexamethasone (DECADRON) 4 MG tablet, Take 2 tablets (8 mg total) by mouth daily. Start the day after carboplatin chemotherapy for 3 days., Disp: 30 tablet, Rfl: 1 .  Ipratropium-Albuterol (COMBIVENT RESPIMAT) 20-100 MCG/ACT AERS respimat, Inhale 1 puff into the lungs every 6 (six) hours as needed for wheezing or shortness of breath., Disp: 1 Inhaler, Rfl: 2 .  lidocaine-prilocaine (EMLA) cream, Apply to affected area once, Disp: 30 g, Rfl: 3 .  ondansetron (ZOFRAN) 8 MG tablet, Take 1 tablet (8 mg total) by mouth 2 (two) times daily as needed for refractory nausea / vomiting. Start on day 3 after carboplatin chemo. (Patient not taking: Reported on 11/02/2018), Disp: 30 tablet, Rfl: 1 .  Oxycodone HCl 10 MG TABS, Take 1 tablet (10 mg total) by mouth every 4 (four) hours as needed., Disp: 120 tablet, Rfl: 0 .  polyethylene glycol (MIRALAX / GLYCOLAX) 17 g packet, Take 17 g by mouth 2 (two) times a week., Disp: , Rfl:  .  prochlorperazine (COMPAZINE) 10 MG tablet, Take 1 tablet (10 mg total) by mouth every 6 (six) hours as needed (Nausea or vomiting). (Patient not taking: Reported on 11/02/2018), Disp: 30 tablet, Rfl: 1  Physical exam:  Vitals:   11/23/18 0842  BP: 105/69  Pulse: (!) 117  Resp: 18  Temp: 97.7 F (36.5 C)  SpO2: 98%  Weight: 114 lb 8 oz (51.9 kg)   Physical Exam Constitutional:      Comments: Patient is thin and cachectic  HENT:     Head: Normocephalic and atraumatic.  Eyes:     Pupils: Pupils are equal, round, and reactive to light.  Neck:     Musculoskeletal: Normal range of motion.  Cardiovascular:     Rate and Rhythm: Regular rhythm. Tachycardia present.     Heart sounds: Normal heart sounds.  Pulmonary:     Effort: Pulmonary effort is normal.     Breath sounds: Normal breath sounds.  Abdominal:     General: Bowel sounds are normal.     Palpations: Abdomen is soft.  Skin:     General: Skin is warm and dry.  Neurological:     Mental Status: He is alert and oriented to person, place, and time.      CMP Latest Ref Rng & Units 11/02/2018  Glucose 70 - 99 mg/dL 108(H)  BUN 6 - 20 mg/dL 12  Creatinine 0.61 - 1.24 mg/dL 0.94  Sodium 135 - 145 mmol/L 136  Potassium 3.5 - 5.1 mmol/L 4.1  Chloride 98 - 111 mmol/L 103  CO2 22 - 32 mmol/L 26  Calcium 8.9 - 10.3 mg/dL 7.6(L)  Total Protein 6.5 - 8.1 g/dL 7.3  Total Bilirubin 0.3 - 1.2 mg/dL 0.6  Alkaline Phos 38 - 126 U/L 111  AST 15 - 41 U/L 14(L)  ALT 0 - 44 U/L 10   CBC Latest Ref Rng & Units 11/02/2018  WBC 4.0 - 10.5 K/uL 10.5  Hemoglobin 13.0 - 17.0 g/dL 9.0(L)  Hematocrit 39.0 - 52.0 % 29.4(L)  Platelets 150 - 400 K/uL 248     Assessment and plan- Patient is a 53 y.o. male  history of stage IIIb likely lung adenocarcinoma T3 N2 M0 presenting as SVC syndrome.  He is status post concurrent chemoradiation with cisplatin and Alimta.  He developed pericardial effusion after 1 dose of durvalumab and was not rechallenged.  Scans in May 2020 showed progression of disease in the left lung as well as right acetabular metastases.  He is s/p palliative radiation to his right hip.  He is here for on treatment assessment prior to cycle 2 of carboplatin Taxol and Avastin chemotherapy  Counts okay to proceed with cycle 3 of carboplatin Taxol and Avastin chemotherapy today.  I will see him back in 3 weeks time for cycle #3.  Check port labs: CBC with differential, CMP and urine protein on that day  Weight loss: Secondary to malignancy.  Encourage Ensure and boost intake.  Will give 1 L of IV fluids today.Xgeva today for bone met  Neoplasm related pain: Continue PRN oxycodone   Visit Diagnosis 1. Malignant neoplasm of upper lobe of right lung ([place])   2. Encounter for antineoplastic chemotherapy   3. Neoplasm related pain      Dr. Randa Evens, MD, MPH [facility] at [facility] [phone] 11/23/2018 8:55  AM

## 2018-11-23 NOTE — Progress Notes (Signed)
Ca =8.1, Alb =3.2, corr ca =8.74

## 2018-11-23 NOTE — Progress Notes (Signed)
Patient here for follow up. No concerns voiced.  °

## 2018-11-29 ENCOUNTER — Telehealth: Payer: Self-pay | Admitting: *Deleted

## 2018-11-29 ENCOUNTER — Other Ambulatory Visit: Payer: Self-pay | Admitting: Oncology

## 2018-11-29 DIAGNOSIS — R630 Anorexia: Secondary | ICD-10-CM

## 2018-11-29 MED ORDER — MEGESTROL ACETATE 400 MG/10ML PO SUSP: 400.0000 mg | Freq: Two times a day (BID) | ORAL | 0 refills | Status: DC

## 2018-11-29 NOTE — Telephone Encounter (Signed)
Pt's friend, Hilda Blades, called in to report pt has been fatigued with decreased appetite. Would like MD recommendations on if something can be done to increase his appetite. Per Dr. Janese Banks, will call in Megace 400mg  BID. Hilda Blades has been made aware.

## 2018-12-02 ENCOUNTER — Other Ambulatory Visit: Payer: Self-pay

## 2018-12-02 ENCOUNTER — Ambulatory Visit: Payer: Self-pay | Admitting: Oncology

## 2018-12-02 ENCOUNTER — Ambulatory Visit: Payer: Self-pay

## 2018-12-03 ENCOUNTER — Other Ambulatory Visit: Payer: Self-pay

## 2018-12-06 ENCOUNTER — Ambulatory Visit
Admission: RE | Admit: 2018-12-06 | Discharge: 2018-12-06 | Disposition: A | Discharge status: Home / Self Care | Payer: Medicaid Other | Source: Ambulatory Visit | Attending: Radiation Oncology | Admitting: Radiation Oncology

## 2018-12-06 ENCOUNTER — Other Ambulatory Visit: Payer: Self-pay

## 2018-12-06 DIAGNOSIS — C3490 Malignant neoplasm of unspecified part of unspecified bronchus or lung: Secondary | ICD-10-CM

## 2018-12-06 NOTE — Progress Notes (Signed)
Radiation Oncology Follow up Note  Name: Alexander [name]   Date:   12/06/2018 MRN:  [id number] DOB: [birth year]    This 53 y.o. male presents to the clinic today for 1 month follow-up status post palliative radiation therapy to his right acetabulum for stage IV adenocarcinoma of the lung  REFERRING PROVIDER: No ref. provider found  HPI: Patient is a 53 year old male now at 1 month having completed palliative radiation therapy to his right acetabulum for stage IV adenocarcinoma the right lung.  He is seen today in routine follow-up is doing well has excellent palliative benefit from treatment ambulating well without pain or discomfort.  He is off his narcotic medications.Marland Kitchen  He is currently receiving chemotherapy for palliation with carbotaxol and Avastin  COMPLICATIONS OF TREATMENT: none  FOLLOW UP COMPLIANCE: keeps appointments   PHYSICAL EXAM:  There were no vitals taken for this visit. Range of motion of his right lower extremities and did not does not elicit pain proprioception is intact motor and sensory levels are equal and symmetric in the lower extremities.  Well-developed well-nourished patient in NAD. HEENT reveals PERLA, EOMI, discs not visualized.  Oral cavity is clear. No oral mucosal lesions are identified. Neck is clear without evidence of cervical or supraclavicular adenopathy. Lungs are clear to A&P. Cardiac examination is essentially unremarkable with regular rate and rhythm without murmur rub or thrill. Abdomen is benign with no organomegaly or masses noted. Motor sensory and DTR levels are equal and symmetric in the upper and lower extremities. Cranial nerves II through XII are grossly intact. Proprioception is intact. No peripheral adenopathy or edema is identified. No motor or sensory levels are noted. Crude visual fields are within normal range.  RADIOLOGY RESULTS: No current films to review  PLAN: Present time patient is achieved excellent palliative benefit from radiation  therapy to his right hip.  I will turn follow-up care over to medical oncology.  They will continue with palliative chemotherapy.  I would be happy to reevaluate the patient anytime should further palliative treatment be indicated.  I would like to take this opportunity to thank you for allowing me to participate in the care of your patient.Noreene Filbert, MD

## 2018-12-14 ENCOUNTER — Other Ambulatory Visit: Payer: Self-pay | Admitting: *Deleted

## 2018-12-14 ENCOUNTER — Inpatient Hospital Stay (HOSPITAL_BASED_OUTPATIENT_CLINIC_OR_DEPARTMENT_OTHER): Payer: Medicaid Other | Admitting: Oncology

## 2018-12-14 ENCOUNTER — Inpatient Hospital Stay: Payer: Medicaid Other | Attending: Oncology

## 2018-12-14 ENCOUNTER — Other Ambulatory Visit: Payer: Self-pay

## 2018-12-14 ENCOUNTER — Inpatient Hospital Stay: Payer: Medicaid Other

## 2018-12-14 ENCOUNTER — Inpatient Hospital Stay (HOSPITAL_BASED_OUTPATIENT_CLINIC_OR_DEPARTMENT_OTHER): Payer: Medicaid Other | Admitting: Hospice and Palliative Medicine

## 2018-12-14 ENCOUNTER — Encounter: Payer: Self-pay | Admitting: Oncology

## 2018-12-14 VITALS — BP 100/68 | HR 108 | Resp 18

## 2018-12-14 VITALS — BP 120/73 | HR 80 | Temp 98.6°F | Resp 20 | Wt 118.6 lb

## 2018-12-14 DIAGNOSIS — Z5189 Encounter for other specified aftercare: Secondary | ICD-10-CM | POA: Insufficient documentation

## 2018-12-14 DIAGNOSIS — Z5112 Encounter for antineoplastic immunotherapy: Secondary | ICD-10-CM | POA: Insufficient documentation

## 2018-12-14 DIAGNOSIS — I313 Pericardial effusion (noninflammatory): Secondary | ICD-10-CM | POA: Diagnosis not present

## 2018-12-14 DIAGNOSIS — C3491 Malignant neoplasm of unspecified part of right bronchus or lung: Secondary | ICD-10-CM | POA: Diagnosis not present

## 2018-12-14 DIAGNOSIS — C3411 Malignant neoplasm of upper lobe, right bronchus or lung: Secondary | ICD-10-CM

## 2018-12-14 DIAGNOSIS — C7951 Secondary malignant neoplasm of bone: Secondary | ICD-10-CM

## 2018-12-14 DIAGNOSIS — D6481 Anemia due to antineoplastic chemotherapy: Secondary | ICD-10-CM | POA: Diagnosis not present

## 2018-12-14 DIAGNOSIS — Z515 Encounter for palliative care: Secondary | ICD-10-CM | POA: Diagnosis not present

## 2018-12-14 DIAGNOSIS — Z95828 Presence of other vascular implants and grafts: Secondary | ICD-10-CM

## 2018-12-14 DIAGNOSIS — Z5111 Encounter for antineoplastic chemotherapy: Secondary | ICD-10-CM | POA: Insufficient documentation

## 2018-12-14 LAB — COMPREHENSIVE METABOLIC PANEL
ALT: 11 U/L (ref 0–44)
AST: 14 U/L — ABNORMAL LOW (ref 15–41)
Albumin: 3.3 g/dL — ABNORMAL LOW (ref 3.5–5.0)
Alkaline Phosphatase: 76 U/L (ref 38–126)
Anion gap: 8 (ref 5–15)
BUN: 15 mg/dL (ref 6–20)
CO2: 21 mmol/L — ABNORMAL LOW (ref 22–32)
Calcium: 7.8 mg/dL — ABNORMAL LOW (ref 8.9–10.3)
Chloride: 105 mmol/L (ref 98–111)
Creatinine, Ser: 0.86 mg/dL (ref 0.61–1.24)
GFR calc Af Amer: >60 mL/min (ref >60)
GFR calc non Af Amer: >60 mL/min (ref >60)
Glucose, Bld: 124 mg/dL — ABNORMAL HIGH (ref 70–99)
Potassium: 3.6 mmol/L (ref 3.5–5.1)
Sodium: 134 mmol/L — ABNORMAL LOW (ref 135–145)
Total Bilirubin: 0.5 mg/dL (ref 0.3–1.2)
Total Protein: 7.2 g/dL (ref 6.5–8.1)

## 2018-12-14 LAB — CBC WITH DIFFERENTIAL/PLATELET
Abs Immature Granulocytes: 0.08 10*3/uL — ABNORMAL HIGH (ref 0.00–0.07)
Basophils Absolute: 0 10*3/uL (ref 0.0–0.1)
Basophils Relative: 0 %
Eosinophils Absolute: 0 10*3/uL (ref 0.0–0.5)
Eosinophils Relative: 0 %
HCT: 30.6 % — ABNORMAL LOW (ref 39.0–52.0)
Hemoglobin: 9.6 g/dL — ABNORMAL LOW (ref 13.0–17.0)
Immature Granulocytes: 1 %
Lymphocytes Relative: 7 %
Lymphs Abs: 0.8 10*3/uL (ref 0.7–4.0)
MCH: 27.2 pg (ref 26.0–34.0)
MCHC: 31.4 g/dL (ref 30.0–36.0)
MCV: 86.7 fL (ref 80.0–100.0)
Monocytes Absolute: 0.9 10*3/uL (ref 0.1–1.0)
Monocytes Relative: 8 %
Neutro Abs: 10.5 10*3/uL — ABNORMAL HIGH (ref 1.7–7.7)
Neutrophils Relative %: 84 %
Platelets: 186 10*3/uL (ref 150–400)
RBC: 3.53 MIL/uL — ABNORMAL LOW (ref 4.22–5.81)
RDW: 22.3 % — ABNORMAL HIGH (ref 11.5–15.5)
WBC: 12.4 10*3/uL — ABNORMAL HIGH (ref 4.0–10.5)
nRBC: 0 % (ref 0.0–0.2)

## 2018-12-14 LAB — PROTEIN, URINE, RANDOM: Total Protein, Urine: 45 mg/dL

## 2018-12-14 MED ORDER — HEPARIN SOD (PORK) LOCK FLUSH 100 UNIT/ML IV SOLN
500.0000 [IU] | Freq: Once | INTRAVENOUS | Status: AC
  Administered 2018-12-14: 15:00:00 500 [IU] via INTRAVENOUS
  Filled 2018-12-14: qty 5

## 2018-12-14 MED ORDER — SODIUM CHLORIDE 0.9 % IV SOLN
Freq: Once | INTRAVENOUS | Status: AC
  Administered 2018-12-14: 10:00:00 via INTRAVENOUS
  Filled 2018-12-14: qty 250

## 2018-12-14 MED ORDER — FAMOTIDINE IN NACL 20-0.9 MG/50ML-% IV SOLN
20.0000 mg | Freq: Once | INTRAVENOUS | Status: AC
  Administered 2018-12-14: 10:00:00 20 mg via INTRAVENOUS
  Filled 2018-12-14: qty 50

## 2018-12-14 MED ORDER — SODIUM CHLORIDE 0.9 % IV SOLN
175.0000 mg/m2 | Freq: Once | INTRAVENOUS | Status: AC
  Administered 2018-12-14: 11:00:00 300 mg via INTRAVENOUS
  Filled 2018-12-14: qty 50

## 2018-12-14 MED ORDER — DIPHENHYDRAMINE HCL 50 MG/ML IJ SOLN
50.0000 mg | Freq: Once | INTRAMUSCULAR | Status: AC
  Administered 2018-12-14: 10:00:00 50 mg via INTRAVENOUS
  Filled 2018-12-14: qty 1

## 2018-12-14 MED ORDER — SODIUM CHLORIDE 0.9% FLUSH
10.0000 mL | INTRAVENOUS | Status: DC | PRN
  Filled 2018-12-14: qty 10

## 2018-12-14 MED ORDER — SODIUM CHLORIDE 0.9 % IV SOLN
500.0000 mg | Freq: Once | INTRAVENOUS | Status: AC
  Administered 2018-12-14: 14:00:00 500 mg via INTRAVENOUS
  Filled 2018-12-14: qty 50

## 2018-12-14 MED ORDER — SODIUM CHLORIDE 0.9 % IV SOLN
800.0000 mg | Freq: Once | INTRAVENOUS | Status: AC
  Administered 2018-12-14: 10:00:00 800 mg via INTRAVENOUS
  Filled 2018-12-14: qty 32

## 2018-12-14 MED ORDER — OXYCODONE HCL 10 MG PO TABS: 10.0000 mg | ORAL_TABLET | ORAL | 0 refills | Status: DC | PRN

## 2018-12-14 MED ORDER — PEGFILGRASTIM 6 MG/0.6ML ~~LOC~~ PSKT
6.0000 mg | PREFILLED_SYRINGE | Freq: Once | SUBCUTANEOUS | Status: AC
  Administered 2018-12-14: 6 mg via SUBCUTANEOUS
  Filled 2018-12-14: qty 0.6

## 2018-12-14 MED ORDER — PALONOSETRON HCL INJECTION 0.25 MG/5ML
0.2500 mg | Freq: Once | INTRAVENOUS | Status: AC
  Administered 2018-12-14: 0.25 mg via INTRAVENOUS
  Filled 2018-12-14: qty 5

## 2018-12-14 MED ORDER — SODIUM CHLORIDE 0.9% FLUSH
10.0000 mL | Freq: Once | INTRAVENOUS | Status: AC
  Administered 2018-12-14: 10 mL via INTRAVENOUS
  Filled 2018-12-14: qty 10

## 2018-12-14 MED ORDER — SODIUM CHLORIDE 0.9 % IV SOLN
Freq: Once | INTRAVENOUS | Status: AC
  Administered 2018-12-14: 10:00:00 via INTRAVENOUS
  Filled 2018-12-14: qty 5

## 2018-12-14 NOTE — Progress Notes (Signed)
Patient is here for follow up, he is doing well no complaints

## 2018-12-14 NOTE — Progress Notes (Signed)
Hematology/Oncology Consult note [facility]  Telephone:([phone] [phone]  Patient Care Team: Patient, No Pcp Per as PCP - General ([place]) Telford Nab, RN as Registered Nurse   Name of the patient: Alexander [name]  [id number]  [birth year]   Date of visit: 12/14/18  Diagnosis- lung adenocarcinoma Stage IIIB cT3 cN2 cM0now with right acetabular metastases and progression of disease in the left lung  Chief complaint/ Reason for visit-on treatment assessment prior to cycle 3 of carbotaxol Avastin chemotherapy  Heme/Onc history:  patient is a 53 year old male with a long-standing history of smoking. He has smoked 1 to 1/2 pack of cigarettes per day for over 30 years. He presented to outside urgent care with some symptoms of shortness of breath and was treated for URI. He subsequently presented with facial and neck swelling which was thought by outside ER to be secondary to a drug reaction. Following that patient came to ER here at [facility].  He underwent CT chest abdomen and pelvis which showed 2.1 x 2 cm irregular mass in the right upper lobe concerning for malignancy.. Multiple collateral veins in chest and abdomen probable thrombosis of the left brachiocephalic vein. This is consistent with IVC syndrome Probable right paratracheal right hilar and precarinal adenopathy resulting in severe narrowing and thrombosis of SVC. This is consistent with SVC syndrome  Patient was seen by vascular surgery and there was no acute need for any endovascular therapies. He is on anticoagulation with Coumadin which has been subsequently changed to Eliquis as an outpatient. Patient initially underwent a bronchoscopy guided biopsy of the mediastinal lymph nodes which was inconclusive and underwent a second IR guided biopsy of the right upper lobe lung mass which was consistent with adenocarcinoma  Patient started radiation treatment on 03/15/2018.  Plan is to give 4 cycles of cisplatin and Alimta followed by maintenance durvalumab. Cycle 1 of cisplatin Alimta given on 03/16/2018.Patient completed 4 cycles of cisplatin and Alimta along with concurrent radiation on 05/20/2018. Scan showed partial response. Cycle 1 of durvalumab given on 06/17/2018. Patient presented to the ER with hypotension tachycardia and was found to have pericardial effusion 10 days after first dose of durvalumab. Fluid negative for malignancy  Patient was empirically treated for pericardial effusion probably secondary to immunotherapy versus radiation and completed 1 month of steroids. Repeat echocardiogram was normal. PDL 1 testing on his initial tumor specimen showed expression of less than 1%.  Scans in May 2020 showed progression of disease in the left lung as well as new right acetabular metastases. Patient received palliative radiation for his bone mets.  Carboplatin, Taxol and Avastin chemotherapy started on 11/02/2018  Interval history-appetite is fair and he is trying to keep up with his weight.  He has chronic fatigue.  Denies any fever or shortness of breath.  Denies any difficulty with his bowel movements.  Right hip pain is relatively well controlled and he takes about 2-3 oxycodone doses in a week.  ECOG PS- 1 Pain scale- 2 Opioid associated constipation- no  Review of systems- Review of Systems  Constitutional: Positive for malaise/fatigue. Negative for chills, fever and weight loss.       Poor appetite  HENT: Negative for congestion, ear discharge and nosebleeds.   Eyes: Negative for blurred vision.  Respiratory: Negative for cough, hemoptysis, sputum production, shortness of breath and wheezing.   Cardiovascular: Negative for chest pain, palpitations, orthopnea and claudication.  Gastrointestinal: Negative for abdominal pain, blood in stool, constipation, diarrhea, heartburn,  melena, nausea and vomiting.  Genitourinary: Negative for dysuria,  flank pain, frequency, hematuria and urgency.  Musculoskeletal: Negative for back pain, joint pain and myalgias.  Skin: Negative for rash.  Neurological: Negative for dizziness, tingling, focal weakness, seizures, weakness and headaches.  Endo/Heme/Allergies: Does not bruise/bleed easily.  Psychiatric/Behavioral: Negative for depression and suicidal ideas. The patient does not have insomnia.        Allergies  Allergen Reactions  . Doxycycline Swelling  . Penicillins Swelling    Did it involve swelling of the face/tongue/throat, SOB, or low BP? Unknown Did it involve sudden or severe rash/hives, skin peeling, or any reaction on the inside of your mouth or nose? Unknown Did you need to seek medical attention at a hospital or doctor's office? Unknown When did it last happen?Childhood If all above answers are "NO", may proceed with cephalosporin use.     Past Medical History:  Diagnosis Date  . Cancer ([place])   . COPD (chronic obstructive pulmonary disease) ([place])   . Lung mass      Past Surgical History:  Procedure Laterality Date  . ELECTROMAGNETIC NAVIGATION BROCHOSCOPY Left 10/13/2018   Procedure: ELECTROMAGNETIC NAVIGATION BRONCHOSCOPY - LEFT;  Surgeon: Tyler Pita, MD;  Location: [facility] OR[facility];  Service: Cardiopulmonary;  Laterality: Left;  . ENDOBRONCHIAL ULTRASOUND N/A 03/11/2018   Procedure: ENDOBRONCHIAL ULTRASOUND;  Surgeon: Tyler Pita, MD;  Location: [facility] OR[facility];  Service: Cardiopulmonary;  Laterality: N/A;  . ENDOBRONCHIAL ULTRASOUND Left 10/13/2018   Procedure: ENDOBRONCHIAL ULTRASOUND - LEFT;  Surgeon: Tyler Pita, MD;  Location: [facility] OR[facility];  Service: Cardiopulmonary;  Laterality: Left;  . none    . PORTA CATH INSERTION N/A 03/12/2018   Procedure: PORTA CATH INSERTION, thigh;  Surgeon: Katha Cabal, MD;  Location: [facility];  Service: Cardiovascular;  Laterality: N/A;  . SUBXYPHOID PERICARDIAL WINDOW N/A 06/30/2018   Procedure:  SUBXYPHOID PERICARDIAL WINDOW;  Surgeon: [name], MD;  Location: [facility];  Service: Open Heart Surgery;  Laterality: N/A;  . TEE WITHOUT CARDIOVERSION N/A 06/30/2018   Procedure: TRANSESOPHAGEAL ECHOCARDIOGRAM (TEE);  Surgeon: [name], MD;  Location: [facility];  Service: Open Heart Surgery;  Laterality: N/A;    Social History   Socioeconomic History  . Marital status: Divorced    Spouse name: Not on file  . Number of children: Not on file  . Years of education: Not on file  . Highest education level: Not on file  Occupational History  . Occupation: Cabin crew  Social Needs  . Financial resource strain: Not on file  . Food insecurity    Worry: Not on file    Inability: Not on file  . Transportation needs    Medical: Not on file    Non-medical: Not on file  Tobacco Use  . Smoking status: Current Every Day Smoker    Packs/day: 0.25  . Smokeless tobacco: Never Used  . Tobacco comment: 4-5 cig a day  Substance and Sexual Activity  . Alcohol use: Not Currently  . Drug use: Not Currently    Types: Marijuana  . Sexual activity: Not Currently  Lifestyle  . Physical activity    Days per week: Not on file    Minutes per session: Not on file  . Stress: Not on file  Relationships  . Social Herbalist on phone: Not on file    Gets together: Not on file    Attends religious service: Not on file    Active  member of club or organization: Not on file    Attends meetings of clubs or organizations: Not on file    Relationship status: Not on file  . Intimate partner violence    Fear of current or ex partner: Not on file    Emotionally abused: Not on file    Physically abused: Not on file    Forced sexual activity: Not on file  Other Topics Concern  . Not on file  Social History Narrative  . Not on file    No family history on file.   Current Outpatient Medications:  .  apixaban (ELIQUIS) 5 MG TABS tablet, Take 1 tablet (5 mg total) by mouth 2 (two)  times daily for 30 days., Disp: 60 tablet, Rfl: 3 .  dexamethasone (DECADRON) 4 MG tablet, Take 2 tablets (8 mg total) by mouth daily. Start the day after carboplatin chemotherapy for 3 days., Disp: 30 tablet, Rfl: 1 .  Ipratropium-Albuterol (COMBIVENT RESPIMAT) 20-100 MCG/ACT AERS respimat, Inhale 1 puff into the lungs every 6 (six) hours as needed for wheezing or shortness of breath., Disp: 1 Inhaler, Rfl: 2 .  lidocaine-prilocaine (EMLA) cream, Apply to affected area once, Disp: 30 g, Rfl: 3 .  ondansetron (ZOFRAN) 8 MG tablet, Take 1 tablet (8 mg total) by mouth 2 (two) times daily as needed for refractory nausea / vomiting. Start on day 3 after carboplatin chemo., Disp: 30 tablet, Rfl: 1 .  Oxycodone HCl 10 MG TABS, Take 1 tablet (10 mg total) by mouth every 4 (four) hours as needed., Disp: 120 tablet, Rfl: 0 .  polyethylene glycol (MIRALAX / GLYCOLAX) 17 g packet, Take 17 g by mouth 2 (two) times a week., Disp: , Rfl:  .  prochlorperazine (COMPAZINE) 10 MG tablet, Take 1 tablet (10 mg total) by mouth every 6 (six) hours as needed (Nausea or vomiting)., Disp: 30 tablet, Rfl: 1 .  chlorproMAZINE (THORAZINE) 25 MG tablet, TAKE ONE TABLET BY MOUTH EVERY 6 HOURS AS NEEDED FOR FOR HICCUPS (Patient not taking: Reported on 12/14/2018), Disp: 30 tablet, Rfl: 1 .  megestrol (MEGACE) 400 MG/10ML suspension, Take 10 mLs (400 mg total) by mouth 2 (two) times daily. (Patient not taking: Reported on 12/14/2018), Disp: 240 mL, Rfl: 0 No current facility-administered medications for this visit.   Facility-Administered Medications Ordered in Other Visits:  .  CARBOplatin (PARAPLATIN) 500 mg in sodium chloride 0.9 % 250 mL chemo infusion, 500 mg, Intravenous, Once, Sindy Guadeloupe, MD .  heparin lock flush 100 unit/mL, 500 Units, Intravenous, Once, Sindy Guadeloupe, MD .  PACLitaxel (TAXOL) 300 mg in sodium chloride 0.9 % 250 mL chemo infusion (> 8m/m2), 175 mg/m2 (Treatment Plan Recorded), Intravenous, Once, RSindy Guadeloupe MD, Last Rate: 100 mL/hr at 12/14/18 1105, 300 mg at 12/14/18 1105 .  pegfilgrastim (NEULASTA ONPRO KIT) injection 6 mg, 6 mg, Subcutaneous, Once, RSindy Guadeloupe MD .  sodium chloride flush (NS) 0.9 % injection 10 mL, 10 mL, Intravenous, Once, RSindy Guadeloupe MD .  sodium chloride flush (NS) 0.9 % injection 10 mL, 10 mL, Intravenous, PRN, RSindy Guadeloupe MD  Physical exam:  Vitals:   12/14/18 0845  BP: 120/73  Pulse: 80  Resp: 20  Temp: 98.6 F (37 C)  Weight: 118 lb 9.6 oz (53.8 kg)   Physical Exam Constitutional:      Comments: Thin man in no acute distress  HENT:     Head: Normocephalic and atraumatic.  Eyes:  Pupils: Pupils are equal, round, and reactive to light.  Neck:     Musculoskeletal: Normal range of motion.  Cardiovascular:     Rate and Rhythm: Normal rate and regular rhythm.     Heart sounds: Normal heart sounds.  Pulmonary:     Effort: Pulmonary effort is normal.     Breath sounds: Normal breath sounds.  Abdominal:     General: Bowel sounds are normal.     Palpations: Abdomen is soft.     Comments: Distended abdominal veins  Skin:    General: Skin is warm and dry.  Neurological:     Mental Status: He is alert and oriented to person, place, and time.      CMP Latest Ref Rng & Units 12/14/2018  Glucose 70 - 99 mg/dL 124(H)  BUN 6 - 20 mg/dL 15  Creatinine 0.61 - 1.24 mg/dL 0.86  Sodium 135 - 145 mmol/L 134(L)  Potassium 3.5 - 5.1 mmol/L 3.6  Chloride 98 - 111 mmol/L 105  CO2 22 - 32 mmol/L 21(L)  Calcium 8.9 - 10.3 mg/dL 7.8(L)  Total Protein 6.5 - 8.1 g/dL 7.2  Total Bilirubin 0.3 - 1.2 mg/dL 0.5  Alkaline Phos 38 - 126 U/L 76  AST 15 - 41 U/L 14(L)  ALT 0 - 44 U/L 11   CBC Latest Ref Rng & Units 12/14/2018  WBC 4.0 - 10.5 K/uL 12.4(H)  Hemoglobin 13.0 - 17.0 g/dL 9.6(L)  Hematocrit 39.0 - 52.0 % 30.6(L)  Platelets 150 - 400 K/uL 186    Assessment and plan- Patient is a 53 y.o. male history of stage IIIb likely lung  adenocarcinoma T3 N2 M0 presenting as SVC syndrome.He is status post concurrent chemoradiation with cisplatin and Alimta. He developed pericardial effusion after 1 dose of durvalumab and was not rechallenged. Scans in May 2020 showed progression of disease in the left lung as well as right acetabular metastases. He is s/p palliative radiation to his right hip.   He is here for on treatment assessment prior to cycle 3 of carbotaxol Avastin chemotherapy  1.  Counts okay to proceed with cycle 3 of carbotaxol Avastin chemotherapy today.  I will see him back in 3 weeks with CBC with differential, CMP and urine protein for cycle 4.  Plan to repeat CT chest abdomen pelvis after 4 cycles.  2.  Right acetabular metastases: Status post palliative radiation.  He is currently on PRN oxycodone.  Pain is well controlled.  He will receive Xgeva today and we will give him Xgeva every 6 weeks instead of every 4 weeks in sync with his chemotherapy.  3.  Patient will be meeting Vonna Kotyk Borders today from palliative care to discuss advanced directives and goals of care  4.  Patient does have baseline anemia and his hemoglobin has remained stable around 9.  Continue to monitor   Visit Diagnosis 1. Malignant neoplasm of upper lobe of right lung ([place])   2. Encounter for monoclonal antibody treatment for malignancy   3. Encounter for antineoplastic chemotherapy   4. Antineoplastic chemotherapy induced anemia      Dr. Randa Evens, MD, MPH [facility] [phone] 12/14/2018 11:30 AM

## 2018-12-14 NOTE — Progress Notes (Signed)
[place]  [phone] [phone]   Name: Alexander [name] Date: 12/14/2018 MRN: [id number]  DOB: 10-30-1965  Patient Care Team: Patient, No Pcp Per as PCP - General ([place]) Telford Nab, RN as Registered Nurse    REASON FOR CONSULTATION: Palliative Care consult requested for this 53 y.o. 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  Most recent scans in May 2020 showed progression of the disease in the left lung with new right acetabular metastases.  Patient was referred to palliative care to help address goals and manage ongoing symptoms.   SOCIAL HISTORY:     reports that he has been smoking. He has been smoking about 0.25 packs per day. He has never used smokeless tobacco. He reports previous alcohol use. He reports previous drug use. Drug: Marijuana.   Patient is not married.  He has no children.  Both parents are deceased.  He has a stepbrother.  Patient lives at home alone.  He has support of several friends who are involved in his care.  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  ADVANCE DIRECTIVES:  Does not have  CODE STATUS:   PAST MEDICAL HISTORY: Past Medical History:  Diagnosis Date   Cancer ([place])    COPD (chronic obstructive pulmonary disease) ([place])    Lung mass     PAST SURGICAL HISTORY:  Past Surgical History:  Procedure Laterality Date   ELECTROMAGNETIC NAVIGATION BROCHOSCOPY Left 10/13/2018   Procedure: ELECTROMAGNETIC NAVIGATION BRONCHOSCOPY - LEFT;  Surgeon: Tyler Pita, MD;  Location: [facility];  Service: Cardiopulmonary;  Laterality: Left;   ENDOBRONCHIAL ULTRASOUND N/A 03/11/2018   Procedure: ENDOBRONCHIAL ULTRASOUND;  Surgeon: Tyler Pita, MD;  Location:  [facility];  Service: Cardiopulmonary;  Laterality: N/A;   ENDOBRONCHIAL ULTRASOUND Left 10/13/2018   Procedure: ENDOBRONCHIAL ULTRASOUND - LEFT;  Surgeon: Tyler Pita, MD;  Location: [facility];  Service: Cardiopulmonary;  Laterality: Left;   none     PORTA CATH INSERTION N/A 03/12/2018   Procedure: PORTA CATH INSERTION, thigh;  Surgeon: Katha Cabal, MD;  Location: [facility];  Service: Cardiovascular;  Laterality: N/A;   SUBXYPHOID PERICARDIAL WINDOW N/A 06/30/2018   Procedure: SUBXYPHOID PERICARDIAL WINDOW;  Surgeon: [name], MD;  Location: [facility];  Service: Open Heart Surgery;  Laterality: N/A;   TEE WITHOUT CARDIOVERSION N/A 06/30/2018   Procedure: TRANSESOPHAGEAL ECHOCARDIOGRAM (TEE);  Surgeon: [name], MD;  Location: [facility];  Service: Open Heart Surgery;  Laterality: N/A;    HEMATOLOGY/ONCOLOGY HISTORY:  Oncology History  Lung cancer ([place])  03/16/2018 Initial Diagnosis   Lung cancer ([place])   03/16/2018 Cancer Staging   Staging form: Lung, AJCC 8th Edition - Clinical stage from 03/16/2018: Stage IIIB (cT3, cN2, cM0) - Signed by Sindy Guadeloupe, MD on 03/16/2018   03/17/2018 - 06/09/2018 Chemotherapy   The patient had palonosetron (ALOXI) injection 0.25 mg, 0.25 mg, Intravenous,  Once, 4 of 4 cycles Administration: 0.25 mg (03/17/2018), 0.25 mg (04/29/2018), 0.25 mg (05/20/2018), 0.25 mg (04/08/2018) PEMEtrexed (ALIMTA) 900 mg in sodium chloride 0.9 % 100 mL chemo infusion, 510 mg/m2 = 875 mg, Intravenous,  Once, 4 of 4 cycles Administration: 900 mg (03/17/2018), 900 mg (04/29/2018), 900 mg (05/20/2018), 900 mg (04/08/2018) CISplatin (PLATINOL) 131 mg in sodium chloride 0.9 % 500  mL chemo infusion, 75 mg/m2 = 131 mg, Intravenous,  Once, 4 of 4 cycles Administration: 131 mg (03/17/2018), 131 mg (04/29/2018), 131 mg (05/20/2018), 131 mg (04/08/2018) fosaprepitant (EMEND) 150 mg, dexamethasone (DECADRON) 12 mg in sodium chloride 0.9 % 145 mL IVPB, ,  Intravenous,  Once, 4 of 4 cycles Administration:  (03/17/2018),  (04/29/2018),  (05/20/2018),  (04/08/2018)  for chemotherapy treatment.    06/17/2018 - 06/30/2018 Chemotherapy   The patient had durvalumab (IMFINZI) 620 mg in sodium chloride 0.9 % 100 mL chemo infusion, 580 mg, Intravenous,  Once, 1 of 6 cycles  for chemotherapy treatment.    11/02/2018 -  Chemotherapy   The patient had palonosetron (ALOXI) injection 0.25 mg, 0.25 mg, Intravenous,  Once, 3 of 6 cycles Administration: 0.25 mg (11/02/2018), 0.25 mg (11/23/2018) pegfilgrastim (NEULASTA ONPRO KIT) injection 6 mg, 6 mg, Subcutaneous, Once, 3 of 6 cycles Administration: 6 mg (11/02/2018), 6 mg (11/23/2018) bevacizumab (AVASTIN) 800 mg in sodium chloride 0.9 % 100 mL chemo infusion, 13.4 mg/kg = 900 mg, Intravenous,  Once, 3 of 6 cycles Administration: 800 mg (11/02/2018), 800 mg (11/23/2018) CARBOplatin (PARAPLATIN) 510 mg in sodium chloride 0.9 % 250 mL chemo infusion, 510 mg (133.6 % of original dose 379.5 mg), Intravenous,  Once, 3 of 6 cycles Dose modification:   (original dose 379.5 mg, Cycle 1) Administration: 510 mg (11/02/2018), 430 mg (11/23/2018) PACLitaxel (TAXOL) 300 mg in sodium chloride 0.9 % 250 mL chemo infusion (> 35m/m2), 175 mg/m2 = 300 mg (100 % of original dose 175 mg/m2), Intravenous,  Once, 3 of 6 cycles Dose modification: 175 mg/m2 (original dose 175 mg/m2, Cycle 1, Reason: Other (see comments), Comment: baseline anemia) Administration: 300 mg (11/02/2018), 300 mg (11/23/2018) fosaprepitant (EMEND) 150 mg, dexamethasone (DECADRON) 12 mg in sodium chloride 0.9 % 145 mL IVPB, , Intravenous,  Once, 3 of 6 cycles Administration:  (11/02/2018),  (11/23/2018)  for chemotherapy treatment.      ALLERGIES:  is allergic to doxycycline and penicillins.  MEDICATIONS:  Current Outpatient Medications  Medication Sig Dispense Refill   apixaban (ELIQUIS) 5 MG TABS tablet Take 1 tablet (5 mg total) by mouth 2 (two) times daily for 30  days. 60 tablet 3   chlorproMAZINE (THORAZINE) 25 MG tablet TAKE ONE TABLET BY MOUTH EVERY 6 HOURS AS NEEDED FOR FOR HICCUPS (Patient not taking: Reported on 12/14/2018) 30 tablet 1   dexamethasone (DECADRON) 4 MG tablet Take 2 tablets (8 mg total) by mouth daily. Start the day after carboplatin chemotherapy for 3 days. 30 tablet 1   Ipratropium-Albuterol (COMBIVENT RESPIMAT) 20-100 MCG/ACT AERS respimat Inhale 1 puff into the lungs every 6 (six) hours as needed for wheezing or shortness of breath. 1 Inhaler 2   lidocaine-prilocaine (EMLA) cream Apply to affected area once 30 g 3   megestrol (MEGACE) 400 MG/10ML suspension Take 10 mLs (400 mg total) by mouth 2 (two) times daily. (Patient not taking: Reported on 12/14/2018) 240 mL 0   ondansetron (ZOFRAN) 8 MG tablet Take 1 tablet (8 mg total) by mouth 2 (two) times daily as needed for refractory nausea / vomiting. Start on day 3 after carboplatin chemo. 30 tablet 1   Oxycodone HCl 10 MG TABS Take 1 tablet (10 mg total) by mouth every 4 (four) hours as needed. 120 tablet 0   polyethylene glycol (MIRALAX / GLYCOLAX) 17 g packet Take 17 g by mouth 2 (two) times a week.     prochlorperazine (COMPAZINE) 10 MG tablet Take 1 tablet (10  mg total) by mouth every 6 (six) hours as needed (Nausea or vomiting). 30 tablet 1   No current facility-administered medications for this visit.    Facility-Administered Medications Ordered in Other Visits  Medication Dose Route Frequency Provider Last Rate Last Dose   bevacizumab (AVASTIN) 800 mg in sodium chloride 0.9 % 100 mL chemo infusion  800 mg Intravenous Once Sindy Guadeloupe, MD 264 mL/hr at 12/14/18 1027 800 mg at 12/14/18 1027   CARBOplatin (PARAPLATIN) 500 mg in sodium chloride 0.9 % 250 mL chemo infusion  500 mg Intravenous Once Sindy Guadeloupe, MD       heparin lock flush 100 unit/mL  500 Units Intravenous Once Sindy Guadeloupe, MD       PACLitaxel (TAXOL) 300 mg in sodium chloride 0.9 % 250 mL chemo  infusion (> 12m/m2)  175 mg/m2 (Treatment Plan Recorded) Intravenous Once RSindy Guadeloupe MD       pegfilgrastim (NEULASTA ONPRO KIT) injection 6 mg  6 mg Subcutaneous Once RSindy Guadeloupe MD       sodium chloride flush (NS) 0.9 % injection 10 mL  10 mL Intravenous Once RSindy Guadeloupe MD       sodium chloride flush (NS) 0.9 % injection 10 mL  10 mL Intravenous PRN RSindy Guadeloupe MD        VITAL SIGNS: There were no vitals taken for this visit. There were no vitals filed for this visit.  Estimated body mass index is 17.51 kg/m as calculated from the following:   Height as of 11/02/18: 5' 9"  (1.753 m).   Weight as of an earlier encounter on 12/14/18: 118 lb 9.6 oz (53.8 kg).  LABS: CBC:    Component Value Date/Time   WBC 12.4 (H) 12/14/2018 0812   HGB 9.6 (L) 12/14/2018 0812   HCT 30.6 (L) 12/14/2018 0812   PLT 186 12/14/2018 0812   MCV 86.7 12/14/2018 0812   NEUTROABS 10.5 (H) 12/14/2018 0812   LYMPHSABS 0.8 12/14/2018 0812   MONOABS 0.9 12/14/2018 0812   EOSABS 0.0 12/14/2018 0812   BASOSABS 0.0 12/14/2018 0812   Comprehensive Metabolic Panel:    Component Value Date/Time   NA 134 (L) 12/14/2018 0812   K 3.6 12/14/2018 0812   CL 105 12/14/2018 0812   CO2 21 (L) 12/14/2018 0812   BUN 15 12/14/2018 0812   CREATININE 0.86 12/14/2018 0812   GLUCOSE 124 (H) 12/14/2018 0812   CALCIUM 7.8 (L) 12/14/2018 0812   AST 14 (L) 12/14/2018 0812   ALT 11 12/14/2018 0812   ALKPHOS 76 12/14/2018 0812   BILITOT 0.5 12/14/2018 0812   PROT 7.2 12/14/2018 0812   ALBUMIN 3.3 (L) 12/14/2018 05284   RADIOGRAPHIC STUDIES: No results found.  PERFORMANCE STATUS (ECOG) : 2 - Symptomatic, <50% confined to bed  Review of Systems Unless otherwise noted, a complete review of systems is negative.  Physical Exam General: NAD, frail appearing, thin Pulmonary: Unlabored Extremities: no edema, no joint deformities Skin: no rashes Neurological: Weakness but otherwise  nonfocal  IMPRESSION: I met with patient today in the infusion area.  Introduced palliative care services and attempted to establish therapeutic rapport.  Patient denies acute changes or concerns today.  He says that following chemotherapy, he has progressive weakness and malaise culminating in a point where he has difficulty getting out of bed on day 3.  Symptoms slowly improve before it is time for his next dose of chemotherapy.  Patient says his goals are  aligned with continued treatment.  However, he recognizes that there will come a point where treatment is no longer effective.  He hopes to maintain his independence and functioning for as long as possible.  Patient speaks candidly regarding his end-of-life.  He has started discussions with family friends regarding completion of the a will.  He does not currently have advance directives but also plans to complete these.  Patient did not seem keen on the idea of aggressive measures at end-of-life.  Together, we reviewed a MOST Form but he wanted to take at home with him and think about options prior to completing the form.  PLAN: -Continue current scope of treatment -MOST Form reviewed and will need to be completed during future visit -RTC in 3 weeks   Patient expressed understanding and was in agreement with this plan. He also understands that He can call the clinic at any time with any questions, concerns, or complaints.     Time Total: 30 minutes  Visit consisted of counseling and education dealing with the complex and emotionally intense issues of symptom management and palliative care in the setting of serious and potentially life-threatening illness.Greater than 50%  of this time was spent counseling and coordinating care related to the above assessment and plan.  Signed by: Altha Harm, PhD, NP-C [phone] (Work Cell)

## 2019-01-03 ENCOUNTER — Other Ambulatory Visit: Payer: Self-pay

## 2019-01-04 ENCOUNTER — Other Ambulatory Visit: Payer: Self-pay | Admitting: *Deleted

## 2019-01-04 ENCOUNTER — Inpatient Hospital Stay (HOSPITAL_BASED_OUTPATIENT_CLINIC_OR_DEPARTMENT_OTHER): Payer: Medicaid Other | Admitting: Oncology

## 2019-01-04 ENCOUNTER — Inpatient Hospital Stay: Payer: Medicaid Other

## 2019-01-04 ENCOUNTER — Encounter: Payer: Self-pay | Admitting: Oncology

## 2019-01-04 ENCOUNTER — Inpatient Hospital Stay (HOSPITAL_BASED_OUTPATIENT_CLINIC_OR_DEPARTMENT_OTHER): Payer: Medicaid Other | Admitting: Hospice and Palliative Medicine

## 2019-01-04 ENCOUNTER — Inpatient Hospital Stay: Payer: Medicaid Other | Attending: Oncology

## 2019-01-04 ENCOUNTER — Other Ambulatory Visit: Payer: Self-pay

## 2019-01-04 VITALS — HR 118

## 2019-01-04 VITALS — BP 117/83 | HR 128 | Temp 97.6°F | Resp 18 | Wt 109.9 lb

## 2019-01-04 DIAGNOSIS — G893 Neoplasm related pain (acute) (chronic): Secondary | ICD-10-CM | POA: Insufficient documentation

## 2019-01-04 DIAGNOSIS — Z7901 Long term (current) use of anticoagulants: Secondary | ICD-10-CM | POA: Insufficient documentation

## 2019-01-04 DIAGNOSIS — C7951 Secondary malignant neoplasm of bone: Secondary | ICD-10-CM | POA: Diagnosis not present

## 2019-01-04 DIAGNOSIS — Z5189 Encounter for other specified aftercare: Secondary | ICD-10-CM | POA: Insufficient documentation

## 2019-01-04 DIAGNOSIS — Z5112 Encounter for antineoplastic immunotherapy: Secondary | ICD-10-CM | POA: Insufficient documentation

## 2019-01-04 DIAGNOSIS — Z95828 Presence of other vascular implants and grafts: Secondary | ICD-10-CM

## 2019-01-04 DIAGNOSIS — Z7983 Long term (current) use of bisphosphonates: Secondary | ICD-10-CM

## 2019-01-04 DIAGNOSIS — Z5111 Encounter for antineoplastic chemotherapy: Secondary | ICD-10-CM

## 2019-01-04 DIAGNOSIS — C3411 Malignant neoplasm of upper lobe, right bronchus or lung: Secondary | ICD-10-CM

## 2019-01-04 DIAGNOSIS — I871 Compression of vein: Secondary | ICD-10-CM | POA: Insufficient documentation

## 2019-01-04 DIAGNOSIS — Z515 Encounter for palliative care: Secondary | ICD-10-CM | POA: Diagnosis not present

## 2019-01-04 DIAGNOSIS — E43 Unspecified severe protein-calorie malnutrition: Secondary | ICD-10-CM | POA: Insufficient documentation

## 2019-01-04 LAB — COMPREHENSIVE METABOLIC PANEL
ALT: 7 U/L (ref 0–44)
AST: 16 U/L (ref 15–41)
Albumin: 3.4 g/dL — ABNORMAL LOW (ref 3.5–5.0)
Alkaline Phosphatase: 98 U/L (ref 38–126)
Anion gap: 9 (ref 5–15)
BUN: 10 mg/dL (ref 6–20)
CO2: 25 mmol/L (ref 22–32)
Calcium: 8.4 mg/dL — ABNORMAL LOW (ref 8.9–10.3)
Chloride: 102 mmol/L (ref 98–111)
Creatinine, Ser: 1.07 mg/dL (ref 0.61–1.24)
GFR calc Af Amer: >60 mL/min (ref >60)
GFR calc non Af Amer: >60 mL/min (ref >60)
Glucose, Bld: 197 mg/dL — ABNORMAL HIGH (ref 70–99)
Potassium: 3.7 mmol/L (ref 3.5–5.1)
Sodium: 136 mmol/L (ref 135–145)
Total Bilirubin: 0.4 mg/dL (ref 0.3–1.2)
Total Protein: 7.1 g/dL (ref 6.5–8.1)

## 2019-01-04 LAB — CBC WITH DIFFERENTIAL/PLATELET
Abs Immature Granulocytes: 0.08 10*3/uL — ABNORMAL HIGH (ref 0.00–0.07)
Basophils Absolute: 0.1 10*3/uL (ref 0.0–0.1)
Basophils Relative: 0 %
Eosinophils Absolute: 0 10*3/uL (ref 0.0–0.5)
Eosinophils Relative: 0 %
HCT: 32 % — ABNORMAL LOW (ref 39.0–52.0)
Hemoglobin: 10.2 g/dL — ABNORMAL LOW (ref 13.0–17.0)
Immature Granulocytes: 1 %
Lymphocytes Relative: 5 %
Lymphs Abs: 0.7 10*3/uL (ref 0.7–4.0)
MCH: 28.6 pg (ref 26.0–34.0)
MCHC: 31.9 g/dL (ref 30.0–36.0)
MCV: 89.6 fL (ref 80.0–100.0)
Monocytes Absolute: 0.8 10*3/uL (ref 0.1–1.0)
Monocytes Relative: 6 %
Neutro Abs: 11.8 10*3/uL — ABNORMAL HIGH (ref 1.7–7.7)
Neutrophils Relative %: 88 %
Platelets: 169 10*3/uL (ref 150–400)
RBC: 3.57 MIL/uL — ABNORMAL LOW (ref 4.22–5.81)
RDW: 23.6 % — ABNORMAL HIGH (ref 11.5–15.5)
WBC: 13.4 10*3/uL — ABNORMAL HIGH (ref 4.0–10.5)
nRBC: 0 % (ref 0.0–0.2)

## 2019-01-04 LAB — PROTEIN, URINE, RANDOM: Total Protein, Urine: 83 mg/dL

## 2019-01-04 MED ORDER — FAMOTIDINE IN NACL 20-0.9 MG/50ML-% IV SOLN
20.0000 mg | Freq: Once | INTRAVENOUS | Status: AC
  Administered 2019-01-04: 20 mg via INTRAVENOUS
  Filled 2019-01-04: qty 50

## 2019-01-04 MED ORDER — SODIUM CHLORIDE 0.9 % IV SOLN: Freq: Once | INTRAVENOUS | Status: DC

## 2019-01-04 MED ORDER — SODIUM CHLORIDE 0.9 % IV SOLN: 460.5000 mg | Freq: Once | INTRAVENOUS | Status: DC

## 2019-01-04 MED ORDER — DIPHENHYDRAMINE HCL 50 MG/ML IJ SOLN
50.0000 mg | Freq: Once | INTRAMUSCULAR | Status: AC
  Administered 2019-01-04: 50 mg via INTRAVENOUS
  Filled 2019-01-04: qty 1

## 2019-01-04 MED ORDER — SODIUM CHLORIDE 0.9 % IV SOLN
175.0000 mg/m2 | Freq: Once | INTRAVENOUS | Status: AC
  Administered 2019-01-04: 300 mg via INTRAVENOUS
  Filled 2019-01-04: qty 50

## 2019-01-04 MED ORDER — SODIUM CHLORIDE 0.9 % IV SOLN
Freq: Once | INTRAVENOUS | Status: AC
  Administered 2019-01-04: 10:00:00 via INTRAVENOUS
  Filled 2019-01-04: qty 5

## 2019-01-04 MED ORDER — DENOSUMAB 120 MG/1.7ML ~~LOC~~ SOLN
120.0000 mg | SUBCUTANEOUS | Status: DC
  Administered 2019-01-04: 120 mg via SUBCUTANEOUS
  Filled 2019-01-04: qty 1.7

## 2019-01-04 MED ORDER — HEPARIN SOD (PORK) LOCK FLUSH 100 UNIT/ML IV SOLN
500.0000 [IU] | Freq: Once | INTRAVENOUS | Status: DC | PRN
  Filled 2019-01-04: qty 5

## 2019-01-04 MED ORDER — PALONOSETRON HCL INJECTION 0.25 MG/5ML
0.2500 mg | Freq: Once | INTRAVENOUS | Status: AC
  Administered 2019-01-04: 10:00:00 0.25 mg via INTRAVENOUS
  Filled 2019-01-04: qty 5

## 2019-01-04 MED ORDER — PEGFILGRASTIM 6 MG/0.6ML ~~LOC~~ PSKT
6.0000 mg | PREFILLED_SYRINGE | Freq: Once | SUBCUTANEOUS | Status: AC
  Administered 2019-01-04: 6 mg via SUBCUTANEOUS
  Filled 2019-01-04: qty 0.6

## 2019-01-04 MED ORDER — OLANZAPINE 10 MG PO TABS: 10.0000 mg | ORAL_TABLET | Freq: Every day | ORAL | 0 refills | Status: AC

## 2019-01-04 MED ORDER — SODIUM CHLORIDE 0.9% FLUSH
10.0000 mL | Freq: Once | INTRAVENOUS | Status: AC
  Administered 2019-01-04: 09:00:00 10 mL via INTRAVENOUS
  Filled 2019-01-04: qty 10

## 2019-01-04 MED ORDER — SODIUM CHLORIDE 0.9 % IV SOLN
400.0000 mg | Freq: Once | INTRAVENOUS | Status: AC
  Administered 2019-01-04: 400 mg via INTRAVENOUS
  Filled 2019-01-04: qty 40

## 2019-01-04 MED ORDER — SODIUM CHLORIDE 0.9 % IV SOLN
Freq: Once | INTRAVENOUS | Status: AC
  Administered 2019-01-04: 11:00:00 via INTRAVENOUS
  Filled 2019-01-04: qty 100

## 2019-01-04 MED ORDER — SODIUM CHLORIDE 0.9 % IV SOLN
Freq: Once | INTRAVENOUS | Status: AC
  Administered 2019-01-04: 10:00:00 via INTRAVENOUS
  Filled 2019-01-04: qty 250

## 2019-01-04 NOTE — Progress Notes (Signed)
Hematology/Oncology Consult note [facility]  [phone] [phone]  Patient Care Team: Patient, No Pcp Per as PCP - General ([place]) Telford Nab, RN as Registered Nurse   Name of the patient: Alexander [name]  [id number]  1965/06/27   Date of visit: 01/04/19  Diagnosis- lung adenocarcinoma Stage IIIB cT3 cN2 cM0now with right acetabular metastases and progression of disease in the left lung   Chief complaint/ Reason for visit-on treatment assessment prior to cycle 4 of carbotaxol Avastin chemotherapy  Heme/Onc history: patient is a 53 year old male with a long-standing history of smoking. He has smoked 1 to 1/2 pack of cigarettes per day for over 30 years. He presented to outside urgent care with some symptoms of shortness of breath and was treated for URI. He subsequently presented with facial and neck swelling which was thought by outside ER to be secondary to a drug reaction. Following that patient came to ER here at [facility].  He underwent CT chest abdomen and pelvis which showed 2.1 x 2 cm irregular mass in the right upper lobe concerning for malignancy.. Multiple collateral veins in chest and abdomen probable thrombosis of the left brachiocephalic vein. This is consistent with IVC syndrome Probable right paratracheal right hilar and precarinal adenopathy resulting in severe narrowing and thrombosis of SVC. This is consistent with SVC syndrome  Patient was seen by vascular surgery and there was no acute need for any endovascular therapies. He is on anticoagulation with Coumadin which has been subsequently changed to Eliquis as an outpatient. Patient initially underwent a bronchoscopy guided biopsy of the mediastinal lymph nodes which was inconclusive and underwent a second IR guided biopsy of the right upper lobe lung mass which was consistent with adenocarcinoma  Patient started radiation treatment on  03/15/2018. Plan is to give 4 cycles of cisplatin and Alimta followed by maintenance durvalumab. Cycle 1 of cisplatin Alimta given on 03/16/2018.Patient completed 4 cycles of cisplatin and Alimta along with concurrent radiation on 05/20/2018. Scan showed partial response. Cycle 1 of durvalumab given on 06/17/2018. Patient presented to the ER with hypotension tachycardia and was found to have pericardial effusion 10 days after first dose of durvalumab. Fluid negative for malignancy  Patient was empirically treated for pericardial effusion probably secondary to immunotherapy versus radiation and completed 1 month of steroids. Repeat echocardiogram was normal. PDL 1 testing on his initial tumor specimen showed expression of less than 1%.  Scans in May 2020 showed progression of disease in the left lung as well as new right acetabular metastases. Patient received palliative radiation for his bone mets.Carboplatin, Taxol and Avastin chemotherapy started on 11/02/2018   Interval history-is down to 109 pounds today from 118 pounds 3 weeks ago.  Patient reports that he is trying to eat but food often does not taste good and when he tries to force himself to eat he feels nauseous and throws up.  Denies any shortness of breath.  Has ongoing fatigue.  Right hip pain is well controlled.  ECOG PS- 1 Pain scale- 0 Opioid associated constipation- no  Review of systems- Review of Systems  Constitutional: Positive for malaise/fatigue and weight loss. Negative for chills and fever.  HENT: Negative for congestion, ear discharge and nosebleeds.   Eyes: Negative for blurred vision.  Respiratory: Negative for cough, hemoptysis, sputum production, shortness of breath and wheezing.   Cardiovascular: Negative for chest pain, palpitations, orthopnea and claudication.  Gastrointestinal: Negative for abdominal pain, blood in stool, constipation, diarrhea, heartburn,  melena, nausea and vomiting.  Genitourinary:  Negative for dysuria, flank pain, frequency, hematuria and urgency.  Musculoskeletal: Negative for back pain, joint pain and myalgias.  Skin: Negative for rash.  Neurological: Negative for dizziness, tingling, focal weakness, seizures, weakness and headaches.  Endo/Heme/Allergies: Does not bruise/bleed easily.  Psychiatric/Behavioral: Negative for depression and suicidal ideas. The patient does not have insomnia.       Allergies  Allergen Reactions  . Doxycycline Swelling  . Penicillins Swelling    Did it involve swelling of the face/tongue/throat, SOB, or low BP? Unknown Did it involve sudden or severe rash/hives, skin peeling, or any reaction on the inside of your mouth or nose? Unknown Did you need to seek medical attention at a hospital or doctor's office? Unknown When did it last happen?Childhood If all above answers are "NO", may proceed with cephalosporin use.     Past Medical History:  Diagnosis Date  . Cancer ([place])   . COPD (chronic obstructive pulmonary disease) ([place])   . Lung mass      Past Surgical History:  Procedure Laterality Date  . ELECTROMAGNETIC NAVIGATION BROCHOSCOPY Left 10/13/2018   Procedure: ELECTROMAGNETIC NAVIGATION BRONCHOSCOPY - LEFT;  Surgeon: Tyler Pita, MD;  Location: [facility];  Service: Cardiopulmonary;  Laterality: Left;  . ENDOBRONCHIAL ULTRASOUND N/A 03/11/2018   Procedure: ENDOBRONCHIAL ULTRASOUND;  Surgeon: Tyler Pita, MD;  Location: [facility];  Service: Cardiopulmonary;  Laterality: N/A;  . ENDOBRONCHIAL ULTRASOUND Left 10/13/2018   Procedure: ENDOBRONCHIAL ULTRASOUND - LEFT;  Surgeon: Tyler Pita, MD;  Location: [facility];  Service: Cardiopulmonary;  Laterality: Left;  . none    . PORTA CATH INSERTION N/A 03/12/2018   Procedure: PORTA CATH INSERTION, thigh;  Surgeon: Katha Cabal, MD;  Location: [place] [facility];  Service: Cardiovascular;  Laterality: N/A;  . SUBXYPHOID PERICARDIAL WINDOW N/A 06/30/2018    Procedure: SUBXYPHOID PERICARDIAL WINDOW;  Surgeon: [name], MD;  Location: [place];  Service: Open Heart Surgery;  Laterality: N/A;  . TEE WITHOUT CARDIOVERSION N/A 06/30/2018   Procedure: TRANSESOPHAGEAL ECHOCARDIOGRAM (TEE);  Surgeon: [name], MD;  Location: [place];  Service: Open Heart Surgery;  Laterality: N/A;    Social History   Socioeconomic History  . Marital status: Divorced    Spouse name: Not on file  . Number of children: Not on file  . Years of education: Not on file  . Highest education level: Not on file  Occupational History  . Occupation: Cabin crew  Social Needs  . Financial resource strain: Not on file  . Food insecurity    Worry: Not on file    Inability: Not on file  . Transportation needs    Medical: Not on file    Non-medical: Not on file  Tobacco Use  . Smoking status: Current Every Day Smoker    Packs/day: 0.25  . Smokeless tobacco: Never Used  . Tobacco comment: 4-5 cig a day  Substance and Sexual Activity  . Alcohol use: Not Currently  . Drug use: Not Currently    Types: Marijuana  . Sexual activity: Not Currently  Lifestyle  . Physical activity    Days per week: Not on file    Minutes per session: Not on file  . Stress: Not on file  Relationships  . Social Herbalist on phone: Not on file    Gets together: Not on file    Attends religious service: Not on file    Active member  of club or organization: Not on file    Attends meetings of clubs or organizations: Not on file    Relationship status: Not on file  . Intimate partner violence    Fear of current or ex partner: Not on file    Emotionally abused: Not on file    Physically abused: Not on file    Forced sexual activity: Not on file  Other Topics Concern  . Not on file  Social History Narrative  . Not on file    No family history on file.   Current Outpatient Medications:  .  dexamethasone (DECADRON) 4 MG tablet, Take 2 tablets (8 mg total) by  mouth daily. Start the day after carboplatin chemotherapy for 3 days., Disp: 30 tablet, Rfl: 1 .  Ipratropium-Albuterol (COMBIVENT RESPIMAT) 20-100 MCG/ACT AERS respimat, Inhale 1 puff into the lungs every 6 (six) hours as needed for wheezing or shortness of breath., Disp: 1 Inhaler, Rfl: 2 .  lidocaine-prilocaine (EMLA) cream, Apply to affected area once, Disp: 30 g, Rfl: 3 .  ondansetron (ZOFRAN) 8 MG tablet, Take 1 tablet (8 mg total) by mouth 2 (two) times daily as needed for refractory nausea / vomiting. Start on day 3 after carboplatin chemo., Disp: 30 tablet, Rfl: 1 .  Oxycodone HCl 10 MG TABS, Take 1 tablet (10 mg total) by mouth every 4 (four) hours as needed., Disp: 120 tablet, Rfl: 0 .  polyethylene glycol (MIRALAX / GLYCOLAX) 17 g packet, Take 17 g by mouth 2 (two) times a week., Disp: , Rfl:  .  prochlorperazine (COMPAZINE) 10 MG tablet, Take 1 tablet (10 mg total) by mouth every 6 (six) hours as needed (Nausea or vomiting)., Disp: 30 tablet, Rfl: 1 .  apixaban (ELIQUIS) 5 MG TABS tablet, Take 1 tablet (5 mg total) by mouth 2 (two) times daily for 30 days., Disp: 60 tablet, Rfl: 3 .  megestrol (MEGACE) 400 MG/10ML suspension, Take 10 mLs (400 mg total) by mouth 2 (two) times daily. (Patient not taking: Reported on 12/14/2018), Disp: 240 mL, Rfl: 0 .  OLANZapine (ZYPREXA) 10 MG tablet, Take 1 tablet (10 mg total) by mouth at bedtime., Disp: 30 tablet, Rfl: 0 No current facility-administered medications for this visit.   Facility-Administered Medications Ordered in Other Visits:  .  0.9 %  sodium chloride infusion, , Intravenous, Once, Sindy Guadeloupe, MD .  Bevacizumab-bvzr 800 mg in sodium chloride 0.9 % 100 mL chemo infusion, , Intravenous, Once, Sindy Guadeloupe, MD .  CARBOplatin (PARAPLATIN) 460 mg in sodium chloride 0.9 % 250 mL chemo infusion, 460 mg, Intravenous, Once, Sindy Guadeloupe, MD .  diphenhydrAMINE (BENADRYL) injection 50 mg, 50 mg, Intravenous, Once, Sindy Guadeloupe, MD .   famotidine (PEPCID) IVPB 20 mg premix, 20 mg, Intravenous, Once, Sindy Guadeloupe, MD .  fosaprepitant (EMEND) 150 mg, dexamethasone (DECADRON) 12 mg in sodium chloride 0.9 % 145 mL IVPB, , Intravenous, Once, Sindy Guadeloupe, MD .  heparin lock flush 100 unit/mL, 500 Units, Intracatheter, Once PRN, Sindy Guadeloupe, MD .  PACLitaxel (TAXOL) 300 mg in sodium chloride 0.9 % 250 mL chemo infusion (> 28m/m2), 175 mg/m2 (Treatment Plan Recorded), Intravenous, Once, RSindy Guadeloupe MD .  palonosetron (ALOXI) injection 0.25 mg, 0.25 mg, Intravenous, Once, RSindy Guadeloupe MD .  pegfilgrastim (NEULASTA ONPRO KIT) injection 6 mg, 6 mg, Subcutaneous, Once, RSindy Guadeloupe MD .  sodium chloride flush (NS) 0.9 % injection 10 mL, 10 mL, Intravenous, Once, RJanese Banks  Weston Anna, MD  Physical exam:  Vitals:   01/04/19 0855  BP: 117/83  Pulse: (!) 128  Resp: 18  Temp: 97.6 F (36.4 C)  TempSrc: Tympanic  SpO2: 96%  Weight: 109 lb 14.4 oz (49.9 kg)   Physical Exam Constitutional:      Comments: Thin gentleman who appears fatigued  HENT:     Head: Normocephalic and atraumatic.  Eyes:     Pupils: Pupils are equal, round, and reactive to light.  Neck:     Musculoskeletal: Normal range of motion.  Cardiovascular:     Rate and Rhythm: Regular rhythm. Tachycardia present.     Heart sounds: Normal heart sounds.  Pulmonary:     Effort: Pulmonary effort is normal.     Breath sounds: Normal breath sounds.  Abdominal:     General: Bowel sounds are normal.     Palpations: Abdomen is soft.  Skin:    General: Skin is warm and dry.  Neurological:     Mental Status: He is alert and oriented to person, place, and time.      CMP Latest Ref Rng & Units 01/04/2019  Glucose 70 - 99 mg/dL 197(H)  BUN 6 - 20 mg/dL 10  Creatinine 0.61 - 1.24 mg/dL 1.07  Sodium 135 - 145 mmol/L 136  Potassium 3.5 - 5.1 mmol/L 3.7  Chloride 98 - 111 mmol/L 102  CO2 22 - 32 mmol/L 25  Calcium 8.9 - 10.3 mg/dL 8.4(L)  Total Protein 6.5  - 8.1 g/dL 7.1  Total Bilirubin 0.3 - 1.2 mg/dL 0.4  Alkaline Phos 38 - 126 U/L 98  AST 15 - 41 U/L 16  ALT 0 - 44 U/L 7   CBC Latest Ref Rng & Units 01/04/2019  WBC 4.0 - 10.5 K/uL 13.4(H)  Hemoglobin 13.0 - 17.0 g/dL 10.2(L)  Hematocrit 39.0 - 52.0 % 32.0(L)  Platelets 150 - 400 K/uL 169      Assessment and plan- Patient is a 53 y.o. male history of stage IIIb likely lung adenocarcinoma T3 N2 M0 presenting as SVC syndrome.He is status post concurrent chemoradiation with cisplatin and Alimta. He developed pericardial effusion after 1 dose of durvalumab and was not rechallenged. Scans in May 2020 showed progression of disease in the left lung as well as right acetabular metastases. He is s/p palliative radiation to his right hip.  He is here for on treatment assessment prior to cycle 4 of carbotaxol Avastin  Counts okay to proceed with cycle 4 of carbotaxol Avastin chemotherapy today.  He has been also receiving onpro Neulasta support along with it.  I will see him back in 3 weeks time with CBC with differential, CMP and urine protein to start cycle 1 of maintenance Avastin.  We will obtain CT chest abdomen pelvis with contrast as well as bone scan prior.  Right acetabular metastases: Status post palliative radiation.  Continue PRN oxycodone.  He will also get Xgeva today.  Weight loss and severe protein calorie malnutrition: Secondary to underlying malignancy.  He is currently drinking 2 ensures a day.  I have encouraged him to try to take 3/day if possible.  I will also add olanzapine for appetite stimulation and nausea.  He is already on Megace  He is on Eliquis for SVC syndrome   Visit Diagnosis 1. Malignant neoplasm of upper lobe of right lung ([place])   2. Encounter for antineoplastic chemotherapy   3. Severe protein-calorie malnutrition ([place])   4. Encounter for monoclonal antibody treatment for  malignancy   5. Neoplasm related pain   6. Long term (current) use of  bisphosphonates      Dr. Randa Evens, MD, MPH [facility] [phone] 01/04/2019 9:41 AM

## 2019-01-04 NOTE — Progress Notes (Signed)
HR 118, ok to proceed per MD

## 2019-01-04 NOTE — Progress Notes (Signed)
[facility]  [phone] [phone]   Name: Alexander [name] Date: 01/04/2019 MRN: [id number]  DOB: Aug 04, 1965  Patient Care Team: Patient, No Pcp Per as PCP - General ([place]) Telford Nab, RN as Registered Nurse    REASON FOR CONSULTATION: Palliative Care consult requested for this 53 y.o. 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  Most recent scans in May 2020 showed progression of the disease in the left lung with new right acetabular metastases.  Patient was referred to palliative care to help address goals and manage ongoing symptoms.   SOCIAL HISTORY:     reports that he has been smoking. He has been smoking about 0.25 packs per day. He has never used smokeless tobacco. He reports previous alcohol use. He reports previous drug use. Drug: Marijuana.   Patient is not married.  He has no children.  Both parents are deceased.  He has a stepbrother.  Patient lives at home alone.  He has support of several friends who are involved in his care.  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  ADVANCE DIRECTIVES:  Does not have  CODE STATUS:   PAST MEDICAL HISTORY: Past Medical History:  Diagnosis Date   Cancer ([place])    COPD (chronic obstructive pulmonary disease) ([place])    Lung mass     PAST SURGICAL HISTORY:  Past Surgical History:  Procedure Laterality Date   ELECTROMAGNETIC NAVIGATION BROCHOSCOPY Left 10/13/2018   Procedure: ELECTROMAGNETIC NAVIGATION BRONCHOSCOPY - LEFT;  Surgeon: Tyler Pita, MD;  Location: [facility];  Service: Cardiopulmonary;  Laterality: Left;   ENDOBRONCHIAL ULTRASOUND N/A 03/11/2018   Procedure: ENDOBRONCHIAL ULTRASOUND;  Surgeon: Tyler Pita, MD;  Location:  [facility];  Service: Cardiopulmonary;  Laterality: N/A;   ENDOBRONCHIAL ULTRASOUND Left 10/13/2018   Procedure: ENDOBRONCHIAL ULTRASOUND - LEFT;  Surgeon: Tyler Pita, MD;  Location: [facility];  Service: Cardiopulmonary;  Laterality: Left;   none     PORTA CATH INSERTION N/A 03/12/2018   Procedure: PORTA CATH INSERTION, thigh;  Surgeon: Katha Cabal, MD;  Location: [facility];  Service: Cardiovascular;  Laterality: N/A;   SUBXYPHOID PERICARDIAL WINDOW N/A 06/30/2018   Procedure: SUBXYPHOID PERICARDIAL WINDOW;  Surgeon: [name], MD;  Location: [facility];  Service: Open Heart Surgery;  Laterality: N/A;   TEE WITHOUT CARDIOVERSION N/A 06/30/2018   Procedure: TRANSESOPHAGEAL ECHOCARDIOGRAM (TEE);  Surgeon: [name], MD;  Location: [facility];  Service: Open Heart Surgery;  Laterality: N/A;    HEMATOLOGY/ONCOLOGY HISTORY:  Oncology History  Lung cancer ([place])  03/16/2018 Initial Diagnosis   Lung cancer ([place])   03/16/2018 Cancer Staging   Staging form: Lung, AJCC 8th Edition - Clinical stage from 03/16/2018: Stage IIIB (cT3, cN2, cM0) - Signed by Sindy Guadeloupe, MD on 03/16/2018   03/17/2018 - 06/09/2018 Chemotherapy   The patient had palonosetron (ALOXI) injection 0.25 mg, 0.25 mg, Intravenous,  Once, 4 of 4 cycles Administration: 0.25 mg (03/17/2018), 0.25 mg (04/29/2018), 0.25 mg (05/20/2018), 0.25 mg (04/08/2018) PEMEtrexed (ALIMTA) 900 mg in sodium chloride 0.9 % 100 mL chemo infusion, 510 mg/m2 = 875 mg, Intravenous,  Once, 4 of 4 cycles Administration: 900 mg (03/17/2018), 900 mg (04/29/2018), 900 mg (05/20/2018), 900 mg (04/08/2018) CISplatin (PLATINOL) 131 mg in sodium chloride 0.9 % 500  mL chemo infusion, 75 mg/m2 = 131 mg, Intravenous,  Once, 4 of 4 cycles Administration: 131 mg (03/17/2018), 131 mg (04/29/2018), 131 mg (05/20/2018), 131 mg (04/08/2018) fosaprepitant (EMEND) 150 mg, dexamethasone (DECADRON) 12 mg in sodium chloride 0.9 % 145 mL IVPB, ,  Intravenous,  Once, 4 of 4 cycles Administration:  (03/17/2018),  (04/29/2018),  (05/20/2018),  (04/08/2018)  for chemotherapy treatment.    06/17/2018 - 06/30/2018 Chemotherapy   The patient had durvalumab (IMFINZI) 620 mg in sodium chloride 0.9 % 100 mL chemo infusion, 580 mg, Intravenous,  Once, 1 of 6 cycles  for chemotherapy treatment.    11/02/2018 -  Chemotherapy   The patient had palonosetron (ALOXI) injection 0.25 mg, 0.25 mg, Intravenous,  Once, 4 of 6 cycles Administration: 0.25 mg (11/02/2018), 0.25 mg (11/23/2018), 0.25 mg (12/14/2018) pegfilgrastim (NEULASTA ONPRO KIT) injection 6 mg, 6 mg, Subcutaneous, Once, 4 of 6 cycles Administration: 6 mg (11/02/2018), 6 mg (11/23/2018), 6 mg (12/14/2018) bevacizumab (AVASTIN) 800 mg in sodium chloride 0.9 % 100 mL chemo infusion, 13.4 mg/kg = 900 mg, Intravenous,  Once, 3 of 3 cycles Administration: 800 mg (11/02/2018), 800 mg (11/23/2018), 800 mg (12/14/2018) CARBOplatin (PARAPLATIN) 510 mg in sodium chloride 0.9 % 250 mL chemo infusion, 510 mg (133.6 % of original dose 379.5 mg), Intravenous,  Once, 4 of 6 cycles Dose modification:   (original dose 379.5 mg, Cycle 1) Administration: 510 mg (11/02/2018), 430 mg (11/23/2018), 500 mg (12/14/2018) PACLitaxel (TAXOL) 300 mg in sodium chloride 0.9 % 250 mL chemo infusion (> 71m/m2), 175 mg/m2 = 300 mg (100 % of original dose 175 mg/m2), Intravenous,  Once, 4 of 6 cycles Dose modification: 175 mg/m2 (original dose 175 mg/m2, Cycle 1, Reason: [facility] (see comments), Comment: baseline anemia) Administration: 300 mg (11/02/2018), 300 mg (11/23/2018), 300 mg (12/14/2018) fosaprepitant (EMEND) 150 mg, dexamethasone (DECADRON) 12 mg in sodium chloride 0.9 % 145 mL IVPB, , Intravenous,  Once, 4 of 6 cycles Administration:  (11/02/2018),  (11/23/2018),  (12/14/2018)  for chemotherapy treatment.      ALLERGIES:  is allergic to doxycycline and penicillins.  MEDICATIONS:  Current Outpatient Medications  Medication Sig Dispense  Refill   apixaban (ELIQUIS) 5 MG TABS tablet Take 1 tablet (5 mg total) by mouth 2 (two) times daily for 30 days. 60 tablet 3   dexamethasone (DECADRON) 4 MG tablet Take 2 tablets (8 mg total) by mouth daily. Start the day after carboplatin chemotherapy for 3 days. 30 tablet 1   Ipratropium-Albuterol (COMBIVENT RESPIMAT) 20-100 MCG/ACT AERS respimat Inhale 1 puff into the lungs every 6 (six) hours as needed for wheezing or shortness of breath. 1 Inhaler 2   lidocaine-prilocaine (EMLA) cream Apply to affected area once 30 g 3   megestrol (MEGACE) 400 MG/10ML suspension Take 10 mLs (400 mg total) by mouth 2 (two) times daily. (Patient not taking: Reported on 12/14/2018) 240 mL 0   OLANZapine (ZYPREXA) 10 MG tablet Take 1 tablet (10 mg total) by mouth at bedtime. 30 tablet 0   ondansetron (ZOFRAN) 8 MG tablet Take 1 tablet (8 mg total) by mouth 2 (two) times daily as needed for refractory nausea / vomiting. Start on day 3 after carboplatin chemo. 30 tablet 1   Oxycodone HCl 10 MG TABS Take 1 tablet (10 mg total) by mouth every 4 (four) hours as needed. 120 tablet 0   polyethylene glycol (MIRALAX / GLYCOLAX) 17 g packet Take 17 g by mouth 2 (two) times a week.     prochlorperazine (  COMPAZINE) 10 MG tablet Take 1 tablet (10 mg total) by mouth every 6 (six) hours as needed (Nausea or vomiting). 30 tablet 1   No current facility-administered medications for this visit.    Facility-Administered Medications Ordered in [facility] Visits  Medication Dose Route Frequency Provider Last Rate Last Dose   CARBOplatin (PARAPLATIN) 400 mg in sodium chloride 0.9 % 250 mL chemo infusion  400 mg Intravenous Once Sindy Guadeloupe, MD       denosumab (XGEVA) injection 120 mg  120 mg Subcutaneous Q30 days Sindy Guadeloupe, MD   120 mg at 01/04/19 1041   heparin lock flush 100 unit/mL  500 Units Intracatheter Once PRN Sindy Guadeloupe, MD       PACLitaxel (TAXOL) 300 mg in sodium chloride 0.9 % 250 mL chemo infusion (>  10m/m2)  175 mg/m2 (Treatment Plan Recorded) Intravenous Once RSindy Guadeloupe MD 100 mL/hr at 01/04/19 1130 300 mg at 01/04/19 1130   pegfilgrastim (NEULASTA ONPRO KIT) injection 6 mg  6 mg Subcutaneous Once RSindy Guadeloupe MD       sodium chloride flush (NS) 0.9 % injection 10 mL  10 mL Intravenous Once RSindy Guadeloupe MD        VITAL SIGNS: There were no vitals taken for this visit. There were no vitals filed for this visit.  Estimated body mass index is 16.23 kg/m as calculated from the following:   Height as of 11/02/18: 5' 9"  (1.753 m).   Weight as of an earlier encounter on 01/04/19: 109 lb 14.4 oz (49.9 kg).  LABS: CBC:    Component Value Date/Time   WBC 13.4 (H) 01/04/2019 0829   HGB 10.2 (L) 01/04/2019 0829   HCT 32.0 (L) 01/04/2019 0829   PLT 169 01/04/2019 0829   MCV 89.6 01/04/2019 0829   NEUTROABS 11.8 (H) 01/04/2019 0829   LYMPHSABS 0.7 01/04/2019 0829   MONOABS 0.8 01/04/2019 0829   EOSABS 0.0 01/04/2019 0829   BASOSABS 0.1 01/04/2019 0829   Comprehensive Metabolic Panel:    Component Value Date/Time   NA 136 01/04/2019 0829   K 3.7 01/04/2019 0829   CL 102 01/04/2019 0829   CO2 25 01/04/2019 0829   BUN 10 01/04/2019 0829   CREATININE 1.07 01/04/2019 0829   GLUCOSE 197 (H) 01/04/2019 0829   CALCIUM 8.4 (L) 01/04/2019 0829   AST 16 01/04/2019 0829   ALT 7 01/04/2019 0829   ALKPHOS 98 01/04/2019 0829   BILITOT 0.4 01/04/2019 0829   PROT 7.1 01/04/2019 0829   ALBUMIN 3.4 (L) 01/04/2019 0829    RADIOGRAPHIC STUDIES: No results found.  PERFORMANCE STATUS (ECOG) : 2 - Symptomatic, <50% confined to bed  Review of Systems Unless otherwise noted, a complete review of systems is negative.  Physical Exam General: NAD, frail appearing, thin Pulmonary: Unlabored Extremities: no edema, no joint deformities Skin: no rashes Neurological: Weakness but otherwise nonfocal  IMPRESSION: Patient seen for routine follow-up in the infusion area while he was  receiving treatment.  Patient says that he is doing reasonably well and denies significant changes or concerns.  He denies any distressing symptoms today.  He reports pain is stable.  Patient does continue to lose weight.  His weight is down to 109 pounds today from 121 pounds 2 months ago.  Patient is drinking ensures 2-3 times a day.  We discussed the option of switching to Ensure Plus, which may provide increased calories.  We also talked about eating smaller portions but  more frequently throughout the day and focusing on high-protein and high calories.  Patient historically has only eaten 1 meal per day.  We will also refer him to RD.  Patient is on daily steroids and has tried Megace in the past but does not appear to be currently taking it.  Would consider trial of mirtazapine 7.5 mg nightly  MOST Form was previously reviewed.  Will discuss with patient more in depth when I can speak privately with him regarding his goals and decisions.  PLAN: -Continue current scope of treatment -Oral supplements 3 times daily -Referral to RD due to weight loss -Consider mirtazapine 7.5 mg nightly -MOST Form reviewed and will need to be completed during future visit -RTC in 3 weeks   Patient expressed understanding and was in agreement with this plan. He also understands that He can call the clinic at any time with any questions, concerns, or complaints.     Time Total: 15 minutes  Visit consisted of counseling and education dealing with the complex and emotionally intense issues of symptom management and palliative care in the setting of serious and potentially life-threatening illness.Greater than 50%  of this time was spent counseling and coordinating care related to the above assessment and plan.  Signed by: Altha Harm, PhD, NP-C [phone] (Work Cell)

## 2019-01-05 MED FILL — Bevacizumab-bvzr IV Soln 400 MG/16ML (For Infusion): INTRAVENOUS | Qty: 32 | Status: AC

## 2019-01-05 MED FILL — Sodium Chloride IV Soln 0.9%: INTRAVENOUS | Qty: 100 | Status: AC

## 2019-01-11 ENCOUNTER — Ambulatory Visit: Payer: Medicaid Other

## 2019-01-11 ENCOUNTER — Other Ambulatory Visit: Payer: Medicaid Other

## 2019-01-11 ENCOUNTER — Other Ambulatory Visit: Payer: Self-pay

## 2019-01-11 ENCOUNTER — Ambulatory Visit
Admission: RE | Admit: 2019-01-11 | Discharge: 2019-01-11 | Disposition: A | Discharge status: Home / Self Care | Payer: Medicaid Other | Source: Ambulatory Visit | Attending: Oncology | Admitting: Oncology

## 2019-01-11 ENCOUNTER — Ambulatory Visit: Payer: Medicaid Other | Admitting: Oncology

## 2019-01-11 DIAGNOSIS — C3411 Malignant neoplasm of upper lobe, right bronchus or lung: Secondary | ICD-10-CM | POA: Insufficient documentation

## 2019-01-11 MED ORDER — IOHEXOL 300 MG/ML  SOLN
100.0000 mL | Freq: Once | INTRAMUSCULAR | Status: AC | PRN
  Administered 2019-01-11: 09:00:00 75 mL via INTRAVENOUS

## 2019-01-13 ENCOUNTER — Other Ambulatory Visit: Payer: Self-pay

## 2019-01-13 ENCOUNTER — Encounter
Admission: RE | Admit: 2019-01-13 | Discharge: 2019-01-13 | Disposition: A | Discharge status: Home / Self Care | Payer: Medicaid Other | Source: Ambulatory Visit | Attending: Oncology | Admitting: Oncology

## 2019-01-13 DIAGNOSIS — C3411 Malignant neoplasm of upper lobe, right bronchus or lung: Secondary | ICD-10-CM | POA: Diagnosis not present

## 2019-01-13 MED ORDER — TECHNETIUM TC 99M MEDRONATE IV KIT
21.2000 | PACK | Freq: Once | INTRAVENOUS | Status: AC | PRN
  Administered 2019-01-13: 10:00:00 21.2 via INTRAVENOUS

## 2019-01-18 ENCOUNTER — Other Ambulatory Visit: Payer: Self-pay | Admitting: Oncology

## 2019-01-18 DIAGNOSIS — I82210 Acute embolism and thrombosis of superior vena cava: Secondary | ICD-10-CM

## 2019-01-24 ENCOUNTER — Telehealth: Payer: Self-pay

## 2019-01-24 ENCOUNTER — Telehealth: Payer: Self-pay | Admitting: *Deleted

## 2019-01-24 NOTE — Telephone Encounter (Signed)
Nutrition Assessment   Reason for Assessment:  Referral from Elfrida, NP for weight loss.   ASSESSMENT:  53 year old male with stage IV adenocarcinoma of lung.  Patient s/p XRT and systemic chemotherapy/immunotherapy currently on active treatment with carboplatin, taxol, avastin.  Noted recent progression of disease. Past medical history of COPD  Spoke with patient via phone this pm.  Patient reports that the biggest factor effecting appetite is starts eating and then half through meal no longer has any taste.  Reports that he is eating 1/2 of normal intake.  Reports occasionally eats breakfast, may have sausage biscuit.  Does not eat again until evening and may have chicken pot pie, chopped barbecue, brunswick stew.  Typically, gets drive thru meals.  Has been drinking 2 ensure plus daily.      Nutrition Focused Physical Exam: deferred   Medications: reviewed   Labs: glucose 197   Anthropometrics:   Height: 69 inches Weight: 109 lb on 8/11 UBW: 160s per patient  Noted weight 138 lb in 06/2018 BMI: 16  21% weight loss in the last 6 months, significant  Estimated Energy Needs  Kcals: 1500-1750 Protein: 75-88 g Fluid: 1.7 L   NUTRITION DIAGNOSIS:  Inadequate oral intake related to cancer and cancer related treatment side effects as evidenced by 21% weight loss in the last 6 months   INTERVENTION:  Discussed ways to increase calories and protein with patient.  Discussed strategies to help with taste alterations.  Will provide handout for patient to pick up tomorrow at visit Will provide case of ensure enlive to patient. Given to RN, Judeen Hammans to give to patient tomorrow 9/1 at visit.  Encouraged to increase to 3 per day. Contact information provided   MONITORING, EVALUATION, GOAL: Patient will consume adequate calories and protein to prevent weight from decreasing.    Next Visit: Sept 21, phone visit  Alexander [name] B. Zenia Resides, [place], [place] Registered Dietitian [phone]  (pager)

## 2019-01-24 NOTE — Telephone Encounter (Signed)
Spoke with pt's primary caregiver, Hilda Blades, who wanted Dr. Janese Banks and Merrily Pew to know new symptoms that pt is experiencing since pt has follow up scheduled tomorrow. Pt has started to have increased pain in left rib cage and right shoulder blade. Having intermittent numbness to hand and feet which makes driving difficult. Debra asked if this could be discussed since pt continues to drive and she is concerned for his safety. Also, pt's oral intake has further declined and is not eating or drinking very much throughout the day.

## 2019-01-25 ENCOUNTER — Inpatient Hospital Stay: Payer: Medicaid Other | Attending: Oncology

## 2019-01-25 ENCOUNTER — Encounter: Payer: Self-pay | Admitting: Oncology

## 2019-01-25 ENCOUNTER — Other Ambulatory Visit: Payer: Self-pay | Admitting: *Deleted

## 2019-01-25 ENCOUNTER — Inpatient Hospital Stay (HOSPITAL_BASED_OUTPATIENT_CLINIC_OR_DEPARTMENT_OTHER): Payer: Medicaid Other | Admitting: Oncology

## 2019-01-25 ENCOUNTER — Inpatient Hospital Stay (HOSPITAL_BASED_OUTPATIENT_CLINIC_OR_DEPARTMENT_OTHER): Payer: Medicaid Other | Admitting: Hospice and Palliative Medicine

## 2019-01-25 ENCOUNTER — Other Ambulatory Visit: Payer: Self-pay

## 2019-01-25 ENCOUNTER — Inpatient Hospital Stay: Payer: Medicaid Other

## 2019-01-25 VITALS — BP 93/77 | HR 102 | Temp 99.8°F | Resp 16 | Ht 69.0 in | Wt 107.3 lb

## 2019-01-25 DIAGNOSIS — Z79899 Other long term (current) drug therapy: Secondary | ICD-10-CM | POA: Insufficient documentation

## 2019-01-25 DIAGNOSIS — Z5112 Encounter for antineoplastic immunotherapy: Secondary | ICD-10-CM | POA: Diagnosis not present

## 2019-01-25 DIAGNOSIS — Z515 Encounter for palliative care: Secondary | ICD-10-CM

## 2019-01-25 DIAGNOSIS — Z923 Personal history of irradiation: Secondary | ICD-10-CM | POA: Diagnosis not present

## 2019-01-25 DIAGNOSIS — Z66 Do not resuscitate: Secondary | ICD-10-CM | POA: Diagnosis not present

## 2019-01-25 DIAGNOSIS — F17210 Nicotine dependence, cigarettes, uncomplicated: Secondary | ICD-10-CM | POA: Diagnosis not present

## 2019-01-25 DIAGNOSIS — Z7901 Long term (current) use of anticoagulants: Secondary | ICD-10-CM | POA: Insufficient documentation

## 2019-01-25 DIAGNOSIS — C7951 Secondary malignant neoplasm of bone: Secondary | ICD-10-CM | POA: Insufficient documentation

## 2019-01-25 DIAGNOSIS — D61810 Antineoplastic chemotherapy induced pancytopenia: Secondary | ICD-10-CM

## 2019-01-25 DIAGNOSIS — C3411 Malignant neoplasm of upper lobe, right bronchus or lung: Secondary | ICD-10-CM

## 2019-01-25 DIAGNOSIS — J449 Chronic obstructive pulmonary disease, unspecified: Secondary | ICD-10-CM | POA: Diagnosis not present

## 2019-01-25 DIAGNOSIS — C3491 Malignant neoplasm of unspecified part of right bronchus or lung: Secondary | ICD-10-CM | POA: Diagnosis not present

## 2019-01-25 LAB — COMPREHENSIVE METABOLIC PANEL
ALT: 9 U/L (ref 0–44)
AST: 13 U/L — ABNORMAL LOW (ref 15–41)
Albumin: 2.8 g/dL — ABNORMAL LOW (ref 3.5–5.0)
Alkaline Phosphatase: 86 U/L (ref 38–126)
Anion gap: 7 (ref 5–15)
BUN: 18 mg/dL (ref 6–20)
CO2: 28 mmol/L (ref 22–32)
Calcium: 8 mg/dL — ABNORMAL LOW (ref 8.9–10.3)
Chloride: 102 mmol/L (ref 98–111)
Creatinine, Ser: 1.06 mg/dL (ref 0.61–1.24)
GFR calc Af Amer: >60 mL/min (ref >60)
GFR calc non Af Amer: >60 mL/min (ref >60)
Glucose, Bld: 125 mg/dL — ABNORMAL HIGH (ref 70–99)
Potassium: 3.9 mmol/L (ref 3.5–5.1)
Sodium: 137 mmol/L (ref 135–145)
Total Bilirubin: 0.4 mg/dL (ref 0.3–1.2)
Total Protein: 7 g/dL (ref 6.5–8.1)

## 2019-01-25 LAB — CBC WITH DIFFERENTIAL/PLATELET
Abs Immature Granulocytes: 0.12 10*3/uL — ABNORMAL HIGH (ref 0.00–0.07)
Basophils Absolute: 0.1 10*3/uL (ref 0.0–0.1)
Basophils Relative: 0 %
Eosinophils Absolute: 0 10*3/uL (ref 0.0–0.5)
Eosinophils Relative: 0 %
HCT: 25.1 % — ABNORMAL LOW (ref 39.0–52.0)
Hemoglobin: 7.9 g/dL — ABNORMAL LOW (ref 13.0–17.0)
Immature Granulocytes: 1 %
Lymphocytes Relative: 5 %
Lymphs Abs: 0.8 10*3/uL (ref 0.7–4.0)
MCH: 29.4 pg (ref 26.0–34.0)
MCHC: 31.5 g/dL (ref 30.0–36.0)
MCV: 93.3 fL (ref 80.0–100.0)
Monocytes Absolute: 1.3 10*3/uL — ABNORMAL HIGH (ref 0.1–1.0)
Monocytes Relative: 7 %
Neutro Abs: 15 10*3/uL — ABNORMAL HIGH (ref 1.7–7.7)
Neutrophils Relative %: 87 %
Platelets: 65 10*3/uL — ABNORMAL LOW (ref 150–400)
RBC: 2.69 MIL/uL — ABNORMAL LOW (ref 4.22–5.81)
RDW: 22.4 % — ABNORMAL HIGH (ref 11.5–15.5)
WBC: 17.3 10*3/uL — ABNORMAL HIGH (ref 4.0–10.5)
nRBC: 0 % (ref 0.0–0.2)

## 2019-01-25 LAB — PROTEIN, URINE, RANDOM: Total Protein, Urine: 79 mg/dL

## 2019-01-25 MED ORDER — HEPARIN SOD (PORK) LOCK FLUSH 100 UNIT/ML IV SOLN
500.0000 [IU] | Freq: Once | INTRAVENOUS | Status: AC
  Administered 2019-01-25: 11:00:00 500 [IU] via INTRAVENOUS
  Filled 2019-01-25: qty 5

## 2019-01-25 MED ORDER — OXYCODONE HCL 10 MG PO TABS: 10.0000 mg | ORAL_TABLET | ORAL | 0 refills | Status: DC | PRN

## 2019-01-25 MED ORDER — COMBIVENT RESPIMAT 20-100 MCG/ACT IN AERS: 1.0000 | INHALATION_SPRAY | Freq: Four times a day (QID) | RESPIRATORY_TRACT | 2 refills | Status: AC | PRN

## 2019-01-25 MED ORDER — LEVOFLOXACIN 500 MG PO TABS: 500.0000 mg | ORAL_TABLET | Freq: Every day | ORAL | 0 refills | Status: DC

## 2019-01-25 MED ORDER — SODIUM CHLORIDE 0.9% FLUSH
10.0000 mL | Freq: Once | INTRAVENOUS | Status: DC
  Filled 2019-01-25: qty 10

## 2019-01-25 NOTE — Progress Notes (Signed)
[facility]  [phone] [phone]   Name: Alexander [name] Date: 01/25/2019 MRN: [id number]  DOB: [birth year]  Patient Care Team: Patient, No Pcp Per as PCP - General ([place]) Telford Nab, RN as Registered Nurse    REASON FOR CONSULTATION: Palliative Care consult requested for this 53 y.o. 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  Most recent scans in May 2020 showed progression of the disease in the left lung with new right acetabular metastases.  Patient was referred to palliative care to help address goals and manage ongoing symptoms.   SOCIAL HISTORY:     reports that he has been smoking. He has been smoking about 0.25 packs per day. He has never used smokeless tobacco. He reports previous alcohol use. He reports previous drug use. Drug: Marijuana.   Patient is not married.  He has no children.  Both parents are deceased.  He has a stepbrother.  Patient lives at home alone.  He has support of several friends who are involved in his care.  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  ADVANCE DIRECTIVES:  Does not have  CODE STATUS: DNR  PAST MEDICAL HISTORY: Past Medical History:  Diagnosis Date   Cancer ([place])    COPD (chronic obstructive pulmonary disease) ([place])    Lung mass     PAST SURGICAL HISTORY:  Past Surgical History:  Procedure Laterality Date   ELECTROMAGNETIC NAVIGATION BROCHOSCOPY Left 10/13/2018   Procedure: ELECTROMAGNETIC NAVIGATION BRONCHOSCOPY - LEFT;  Surgeon: Tyler Pita, MD;  Location: [facility];  Service: Cardiopulmonary;  Laterality: Left;   ENDOBRONCHIAL ULTRASOUND N/A 03/11/2018   Procedure: ENDOBRONCHIAL ULTRASOUND;  Surgeon: Tyler Pita, MD;   Location: [facility];  Service: Cardiopulmonary;  Laterality: N/A;   ENDOBRONCHIAL ULTRASOUND Left 10/13/2018   Procedure: ENDOBRONCHIAL ULTRASOUND - LEFT;  Surgeon: Tyler Pita, MD;  Location: [facility];  Service: Cardiopulmonary;  Laterality: Left;   none     PORTA CATH INSERTION N/A 03/12/2018   Procedure: PORTA CATH INSERTION, thigh;  Surgeon: Katha Cabal, MD;  Location: [facility];  Service: Cardiovascular;  Laterality: N/A;   SUBXYPHOID PERICARDIAL WINDOW N/A 06/30/2018   Procedure: SUBXYPHOID PERICARDIAL WINDOW;  Surgeon: [name], MD;  Location: [facility];  Service: Open Heart Surgery;  Laterality: N/A;   TEE WITHOUT CARDIOVERSION N/A 06/30/2018   Procedure: TRANSESOPHAGEAL ECHOCARDIOGRAM (TEE);  Surgeon: [name], MD;  Location: [place];  Service: Open Heart Surgery;  Laterality: N/A;    HEMATOLOGY/ONCOLOGY HISTORY:  Oncology History  Lung cancer ([place])  03/16/2018 Initial Diagnosis   Lung cancer ([place])   03/16/2018 Cancer Staging   Staging form: Lung, AJCC 8th Edition - Clinical stage from 03/16/2018: Stage IIIB (cT3, cN2, cM0) - Signed by Sindy Guadeloupe, MD on 03/16/2018   03/17/2018 - 06/09/2018 Chemotherapy   The patient had palonosetron (ALOXI) injection 0.25 mg, 0.25 mg, Intravenous,  Once, 4 of 4 cycles Administration: 0.25 mg (03/17/2018), 0.25 mg (04/29/2018), 0.25 mg (05/20/2018), 0.25 mg (04/08/2018) PEMEtrexed (ALIMTA) 900 mg in sodium chloride 0.9 % 100 mL chemo infusion, 510 mg/m2 = 875 mg, Intravenous,  Once, 4 of 4 cycles Administration: 900 mg (03/17/2018), 900 mg (04/29/2018), 900 mg (05/20/2018), 900 mg (04/08/2018) CISplatin (PLATINOL) 131 mg in sodium chloride 0.9 % 500  mL chemo infusion, 75 mg/m2 = 131 mg, Intravenous,  Once, 4 of 4 cycles Administration: 131 mg (03/17/2018), 131 mg (04/29/2018), 131 mg (05/20/2018), 131 mg (04/08/2018) fosaprepitant (EMEND) 150 mg, dexamethasone (DECADRON) 12 mg in sodium chloride 0.9 % 145 mL  IVPB, , Intravenous,  Once, 4 of 4 cycles Administration:  (03/17/2018),  (04/29/2018),  (05/20/2018),  (04/08/2018)  for chemotherapy treatment.    06/17/2018 - 06/30/2018 Chemotherapy   The patient had durvalumab (IMFINZI) 620 mg in sodium chloride 0.9 % 100 mL chemo infusion, 580 mg, Intravenous,  Once, 1 of 6 cycles  for chemotherapy treatment.    11/02/2018 -  Chemotherapy   The patient had palonosetron (ALOXI) injection 0.25 mg, 0.25 mg, Intravenous,  Once, 4 of 6 cycles Administration: 0.25 mg (11/02/2018), 0.25 mg (11/23/2018), 0.25 mg (12/14/2018), 0.25 mg (01/04/2019) pegfilgrastim (NEULASTA ONPRO KIT) injection 6 mg, 6 mg, Subcutaneous, Once, 4 of 6 cycles Administration: 6 mg (11/02/2018), 6 mg (11/23/2018), 6 mg (12/14/2018), 6 mg (01/04/2019) bevacizumab (AVASTIN) 800 mg in sodium chloride 0.9 % 100 mL chemo infusion, 13.4 mg/kg = 900 mg, Intravenous,  Once, 3 of 3 cycles Administration: 800 mg (11/02/2018), 800 mg (11/23/2018), 800 mg (12/14/2018) CARBOplatin (PARAPLATIN) 510 mg in sodium chloride 0.9 % 250 mL chemo infusion, 510 mg (133.6 % of original dose 379.5 mg), Intravenous,  Once, 4 of 6 cycles Dose modification:   (original dose 379.5 mg, Cycle 1) Administration: 510 mg (11/02/2018), 430 mg (11/23/2018), 500 mg (12/14/2018) PACLitaxel (TAXOL) 300 mg in sodium chloride 0.9 % 250 mL chemo infusion (> 20m/m2), 175 mg/m2 = 300 mg (100 % of original dose 175 mg/m2), Intravenous,  Once, 4 of 6 cycles Dose modification: 175 mg/m2 (original dose 175 mg/m2, Cycle 1, Reason: Other (see comments), Comment: baseline anemia) Administration: 300 mg (11/02/2018), 300 mg (11/23/2018), 300 mg (12/14/2018), 300 mg (01/04/2019) Bevacizumab-bvzr 800 mg in sodium chloride 0.9 % 100 mL chemo infusion, , Intravenous, Once, 1 of 3 cycles fosaprepitant (EMEND) 150 mg, dexamethasone (DECADRON) 12 mg in sodium chloride 0.9 % 145 mL IVPB, , Intravenous,  Once, 4 of 6 cycles Administration:  (11/02/2018),  (11/23/2018),   (12/14/2018),  (01/04/2019)  for chemotherapy treatment.      ALLERGIES:  is allergic to doxycycline and penicillins.  MEDICATIONS:  Current Outpatient Medications  Medication Sig Dispense Refill   ELIQUIS 5 MG TABS tablet TAKE 1 TABLET BY MOUTH TWICE DAILY 180 tablet 3   guaiFENesin (MUCINEX) 600 MG 12 hr tablet Take 600 mg by mouth 2 (two) times daily as needed.     Ipratropium-Albuterol (COMBIVENT RESPIMAT) 20-100 MCG/ACT AERS respimat Inhale 1 puff into the lungs every 6 (six) hours as needed for wheezing or shortness of breath. (Patient not taking: Reported on 01/25/2019) 1 Inhaler 2   lidocaine-prilocaine (EMLA) cream Apply to affected area once 30 g 3   megestrol (MEGACE) 400 MG/10ML suspension Take 10 mLs (400 mg total) by mouth 2 (two) times daily. (Patient not taking: Reported on 12/14/2018) 240 mL 0   OLANZapine (ZYPREXA) 10 MG tablet Take 1 tablet (10 mg total) by mouth at bedtime. 30 tablet 0   ondansetron (ZOFRAN) 8 MG tablet Take 1 tablet (8 mg total) by mouth 2 (two) times daily as needed for refractory nausea / vomiting. Start on day 3 after carboplatin chemo. 30 tablet 1   Oxycodone HCl 10 MG TABS Take 1 tablet (10 mg total) by mouth every 4 (four) hours as needed. 120 tablet 0   polyethylene glycol (  MIRALAX / GLYCOLAX) 17 g packet Take 17 g by mouth 2 (two) times a week.     prochlorperazine (COMPAZINE) 10 MG tablet Take 1 tablet (10 mg total) by mouth every 6 (six) hours as needed (Nausea or vomiting). 30 tablet 1   No current facility-administered medications for this visit.    Facility-Administered Medications Ordered in Other Visits  Medication Dose Route Frequency Provider Last Rate Last Dose   sodium chloride flush (NS) 0.9 % injection 10 mL  10 mL Intravenous Once Sindy Guadeloupe, MD        VITAL SIGNS: There were no vitals taken for this visit. There were no vitals filed for this visit.  Estimated body mass index is 15.85 kg/m as calculated from the  following:   Height as of an earlier encounter on 01/25/19: _0  (1.753 m).   Weight as of an earlier encounter on 01/25/19: 107 lb 4.8 oz (48.7 kg).  LABS: CBC:    Component Value Date/Time   WBC 17.3 (H) 01/25/2019 0918   HGB 7.9 (L) 01/25/2019 0918   HCT 25.1 (L) 01/25/2019 0918   PLT 65 (L) 01/25/2019 0918   MCV 93.3 01/25/2019 0918   NEUTROABS 15.0 (H) 01/25/2019 0918   LYMPHSABS 0.8 01/25/2019 0918   MONOABS 1.3 (H) 01/25/2019 0918   EOSABS 0.0 01/25/2019 0918   BASOSABS 0.1 01/25/2019 0918   Comprehensive Metabolic Panel:    Component Value Date/Time   NA 137 01/25/2019 0918   K 3.9 01/25/2019 0918   CL 102 01/25/2019 0918   CO2 28 01/25/2019 0918   BUN 18 01/25/2019 0918   CREATININE 1.06 01/25/2019 0918   GLUCOSE 125 (H) 01/25/2019 0918   CALCIUM 8.0 (L) 01/25/2019 0918   AST 13 (L) 01/25/2019 0918   ALT 9 01/25/2019 0918   ALKPHOS 86 01/25/2019 0918   BILITOT 0.4 01/25/2019 0918   PROT 7.0 01/25/2019 0918   ALBUMIN 2.8 (L) 01/25/2019 0918    RADIOGRAPHIC STUDIES: Ct Chest W Contrast  Result Date: 01/11/2019 CLINICAL DATA:  Lung cancer.  Restaging. EXAM: CT CHEST, ABDOMEN, AND PELVIS WITH CONTRAST TECHNIQUE: Multidetector CT imaging of the chest, abdomen and pelvis was performed following the standard protocol during bolus administration of intravenous contrast. CONTRAST:  32m OMNIPAQUE IOHEXOL 300 MG/ML  SOLN COMPARISON:  PET-CT 09/28/2018.  Chest CT 10/11/2018 FINDINGS: CT CHEST FINDINGS Cardiovascular: The heart size is normal. No substantial pericardial effusion. No thoracic aortic aneurysm. Mediastinum/Nodes: Ill-defined right paratracheal soft tissue measured previously at 2.1 x 1.7 cm measures 2.0 x 1.5 cm today. Abnormal soft tissue in the right hilum is 1.3 cm short axis (30/2), stable in the interval. Mild circumferential wall thickening noted mid and distal esophagus, as before. There is no axillary lymphadenopathy. Lungs/Pleura: Centrilobular emphsyema  noted. 1.1 x 1.0 cm nodule medial right upper lobe is similar to prior. Bronchial wall thickening with tree-in-bud opacity in the right upper lobe, right middle lobe, and right lower lobe has progressed in the interval. Some of the nodularity in the right upper lobe has become confluent, measuring 1.3 x 0.6 cm on 80/3. The 2.9 x 2.6 cm central left lower lobe lesion has evolved in the interval, now extending in the left lower lobe to the lateral pleura. Lesion today measures 4.9 x 3.4 cm in shows central cavitation with adjacent pleural thickening. Musculoskeletal: No worrisome lytic or sclerotic osseous abnormality. CT ABDOMEN PELVIS FINDINGS Hepatobiliary: No suspicious focal abnormality within the liver parenchyma. There is no evidence for  gallstones, gallbladder wall thickening, or pericholecystic fluid. No intrahepatic or extrahepatic biliary dilation. Pancreas: No focal mass lesion. No dilatation of the main duct. No intraparenchymal cyst. No peripancreatic edema. Spleen: 1.5 cm fluid density lesion is identified between the proximal stomach and the spleen (67/2). This appears new in the interval. Adrenals/Urinary Tract: Adrenal thickening without nodularity evident. Kidneys unremarkable. No evidence for hydroureter. The urinary bladder appears normal for the degree of distention. Stomach/Bowel: Stomach is unremarkable. No gastric wall thickening. No evidence of outlet obstruction. Duodenum is normally positioned as is the ligament of Treitz. No small bowel wall thickening. No small bowel dilatation. No gross colonic mass. No colonic wall thickening. Vascular/Lymphatic: There is abdominal aortic atherosclerosis without aneurysm. There is no gastrohepatic or hepatoduodenal ligament lymphadenopathy. No intraperitoneal or retroperitoneal lymphadenopathy. No pelvic sidewall lymphadenopathy. Right common femoral venous catheter tip is positioned in the infra hepatic IVC. Reproductive: Unremarkable. Other: No  intraperitoneal free fluid. Musculoskeletal: Similar appearance of the small lytic lesion in the right acetabulum (111/2). Sclerotic focus in the left sacrum is stable. IMPRESSION: 1. Interval evolution of the left lower lobe lesion, now larger and extending to the lateral pleura. The mass demonstrates central cavitation and measures 4.9 x 3.4 cm. Disease progression cannot be excluded. 2. Interval development of diffuse tree-in-bud nodularity in all 3 lobes of the right lung, but most pronounced in the right upper lobe. Features probably reflect atypical infection with some confluent nodular areas measuring up to 1.3 cm. Close follow-up recommended. 3. Posterior tree-in-bud opacity in the superior segment left lower lobe also likely atypical infection although endobronchial spread of disease cannot be excluded. 4. Similar appearance of ill-defined abnormal soft tissue in the right parahilar region and right hilum. 5. Stable lytic metastatic lesion right acetabulum. Electronically Signed   By: Misty Stanley M.D.   On: 01/11/2019 09:51   Nm Bone Scan Whole Body  Result Date: 01/13/2019 CLINICAL DATA:  Stage IV lung cancer, bone metastases, on chemotherapy, question response to treatment EXAM: NUCLEAR MEDICINE WHOLE BODY BONE SCAN TECHNIQUE: Whole body anterior and posterior images were obtained approximately 3 hours after intravenous injection of radiopharmaceutical. RADIOPHARMACEUTICALS:  21.20 mCi Technetium-33mMDP IV COMPARISON:  None Correlation: CT chest abdomen pelvis 01/09/2019, PET-CT 09/28/2018 FINDINGS: Normal ectatic of tracer within the axial and appendicular skeleton. No scintigraphic evidence of osseous metastatic disease. Specifically, no abnormal uptake of tracer is seen at the site of abnormal FDG accumulation at the RIGHT acetabulum identified on the prior PET-CT. Expected urinary tract and soft tissue distribution of tracer. IMPRESSION: No definite scintigraphic evidence of osseous metastatic  disease. Electronically Signed   By: MLavonia DanaM.D.   On: 01/13/2019 16:36   Ct Abdomen Pelvis W Contrast  Result Date: 01/11/2019 CLINICAL DATA:  Lung cancer.  Restaging. EXAM: CT CHEST, ABDOMEN, AND PELVIS WITH CONTRAST TECHNIQUE: Multidetector CT imaging of the chest, abdomen and pelvis was performed following the standard protocol during bolus administration of intravenous contrast. CONTRAST:  740mOMNIPAQUE IOHEXOL 300 MG/ML  SOLN COMPARISON:  PET-CT 09/28/2018.  Chest CT 10/11/2018 FINDINGS: CT CHEST FINDINGS Cardiovascular: The heart size is normal. No substantial pericardial effusion. No thoracic aortic aneurysm. Mediastinum/Nodes: Ill-defined right paratracheal soft tissue measured previously at 2.1 x 1.7 cm measures 2.0 x 1.5 cm today. Abnormal soft tissue in the right hilum is 1.3 cm short axis (30/2), stable in the interval. Mild circumferential wall thickening noted mid and distal esophagus, as before. There is no axillary lymphadenopathy. Lungs/Pleura: Centrilobular emphsyema noted. 1.1  x 1.0 cm nodule medial right upper lobe is similar to prior. Bronchial wall thickening with tree-in-bud opacity in the right upper lobe, right middle lobe, and right lower lobe has progressed in the interval. Some of the nodularity in the right upper lobe has become confluent, measuring 1.3 x 0.6 cm on 80/3. The 2.9 x 2.6 cm central left lower lobe lesion has evolved in the interval, now extending in the left lower lobe to the lateral pleura. Lesion today measures 4.9 x 3.4 cm in shows central cavitation with adjacent pleural thickening. Musculoskeletal: No worrisome lytic or sclerotic osseous abnormality. CT ABDOMEN PELVIS FINDINGS Hepatobiliary: No suspicious focal abnormality within the liver parenchyma. There is no evidence for gallstones, gallbladder wall thickening, or pericholecystic fluid. No intrahepatic or extrahepatic biliary dilation. Pancreas: No focal mass lesion. No dilatation of the main duct. No  intraparenchymal cyst. No peripancreatic edema. Spleen: 1.5 cm fluid density lesion is identified between the proximal stomach and the spleen (67/2). This appears new in the interval. Adrenals/Urinary Tract: Adrenal thickening without nodularity evident. Kidneys unremarkable. No evidence for hydroureter. The urinary bladder appears normal for the degree of distention. Stomach/Bowel: Stomach is unremarkable. No gastric wall thickening. No evidence of outlet obstruction. Duodenum is normally positioned as is the ligament of Treitz. No small bowel wall thickening. No small bowel dilatation. No gross colonic mass. No colonic wall thickening. Vascular/Lymphatic: There is abdominal aortic atherosclerosis without aneurysm. There is no gastrohepatic or hepatoduodenal ligament lymphadenopathy. No intraperitoneal or retroperitoneal lymphadenopathy. No pelvic sidewall lymphadenopathy. Right common femoral venous catheter tip is positioned in the infra hepatic IVC. Reproductive: Unremarkable. Other: No intraperitoneal free fluid. Musculoskeletal: Similar appearance of the small lytic lesion in the right acetabulum (111/2). Sclerotic focus in the left sacrum is stable. IMPRESSION: 1. Interval evolution of the left lower lobe lesion, now larger and extending to the lateral pleura. The mass demonstrates central cavitation and measures 4.9 x 3.4 cm. Disease progression cannot be excluded. 2. Interval development of diffuse tree-in-bud nodularity in all 3 lobes of the right lung, but most pronounced in the right upper lobe. Features probably reflect atypical infection with some confluent nodular areas measuring up to 1.3 cm. Close follow-up recommended. 3. Posterior tree-in-bud opacity in the superior segment left lower lobe also likely atypical infection although endobronchial spread of disease cannot be excluded. 4. Similar appearance of ill-defined abnormal soft tissue in the right parahilar region and right hilum. 5. Stable  lytic metastatic lesion right acetabulum. Electronically Signed   By: Misty Stanley M.D.   On: 01/11/2019 09:51    PERFORMANCE STATUS (ECOG) : 2 - Symptomatic, <50% confined to bed  Review of Systems Unless otherwise noted, a complete review of systems is negative.  Physical Exam General: NAD, frail appearing, thin Pulmonary: Unlabored Extremities: no edema, no joint deformities Skin: no rashes Neurological: Weakness but otherwise nonfocal  IMPRESSION: Patient seen for routine follow-up.  Patient was thrombocytopenic so treatment was held today.  Patient saw Dr. Janese Banks.  Results of CT of abdomen and chest were reviewed.  Patient has some interval progression in the right lung.    Patient says that he recognizes that future treatment options are limited.    He says that he is a "realist."  He has started planning for his end-of-life and is currently trying to complete ACP documents and financial wills.  We discussed decision-making.  Patient says that he would not want to be resuscitated or have his life prolonged artificially machines.  He still  has the MOST form at home and wants to bring that in with him to complete at the time of the next visit.  I had a message to call his sister-in-law Hilda Blades but patient asked that I not call her during the visit as she is at that veterinarian office.  We will try to call her later.  Symptomatically, patient continues to have pain.  We discussed initiation of an LAO but patient declined.  He feels that his oral intake is improved.  He ate 2 bowls of [place] soup last night.  I do see that his weight is down another 2 pounds over the past month.  Patient spoke with the RD yesterday and was sent home today with a box of oral supplements.  PLAN: -Continue current scope of treatment -Oral supplements 3 times daily -MOST Form reviewed and will need to be completed during future visit -RTC in 2 weeks   Patient expressed understanding and was in  agreement with this plan. He also understands that He can call the clinic at any time with any questions, concerns, or complaints.     Time Total: 20 minutes  Visit consisted of counseling and education dealing with the complex and emotionally intense issues of symptom management and palliative care in the setting of serious and potentially life-threatening illness.Greater than 50%  of this time was spent counseling and coordinating care related to the above assessment and plan.  Signed by: Altha Harm, PhD, NP-C [phone] (Work Cell)

## 2019-01-25 NOTE — Progress Notes (Signed)
Pt feels like he is finally getting some taste buds back, he has pain in left back and rib area and then he has pain in back at right shoulder blade. Needs refill of pain med and inhaler. He has been taking mucinex for cough and when he has mucus it is thick clear and horrible smell. His b/p is low and he states that if he bends down to pick something up sometimes he gets dizzy. His legs get weak and he feels like they might buckle and he sits down , waits few min. And then he is fine

## 2019-01-28 NOTE — Progress Notes (Signed)
Hematology/Oncology Consult note [facility]  Telephone:([phone] [phone]  Patient Care Team: Patient, No Pcp Per as PCP - General ([place]) Telford Nab, RN as Registered Nurse   Name of the patient: Alexander [name]  [id number]  06-May-1966   Date of visit: 01/28/19  Diagnosis- lung adenocarcinoma Stage IIIB cT3 cN2 cM0now with right acetabular metastases and progression of disease in the left lung   Chief complaint/ Reason for visit-discuss CT scan results and on treatment assessment prior to cycle 1 of maintenance Avastin  Heme/Onc history: patient is a 53 year old male with a long-standing history of smoking. He has smoked 1 to 1/2 pack of cigarettes per day for over 30 years. He presented to outside urgent care with some symptoms of shortness of breath and was treated for URI. He subsequently presented with facial and neck swelling which was thought by outside ER to be secondary to a drug reaction. Following that patient came to ER here at [facility].  He underwent CT chest abdomen and pelvis which showed 2.1 x 2 cm irregular mass in the right upper lobe concerning for malignancy.. Multiple collateral veins in chest and abdomen probable thrombosis of the left brachiocephalic vein. This is consistent with IVC syndrome Probable right paratracheal right hilar and precarinal adenopathy resulting in severe narrowing and thrombosis of SVC. This is consistent with SVC syndrome  Patient was seen by vascular surgery and there was no acute need for any endovascular therapies. He is on anticoagulation with Coumadin which has been subsequently changed to Eliquis as an outpatient. Patient initially underwent a bronchoscopy guided biopsy of the mediastinal lymph nodes which was inconclusive and underwent a second IR guided biopsy of the right upper lobe lung mass which was consistent with adenocarcinoma  Patient started radiation  treatment on 03/15/2018. Plan is to give 4 cycles of cisplatin and Alimta followed by maintenance durvalumab. Cycle 1 of cisplatin Alimta given on 03/16/2018.Patient completed 4 cycles of cisplatin and Alimta along with concurrent radiation on 05/20/2018. Scan showed partial response. Cycle 1 of durvalumab given on 06/17/2018. Patient presented to the ER with hypotension tachycardia and was found to have pericardial effusion 10 days after first dose of durvalumab. Fluid negative for malignancy  Patient was empirically treated for pericardial effusion probably secondary to immunotherapy versus radiation and completed 1 month of steroids. Repeat echocardiogram was normal. PDL 1 testing on his initial tumor specimen showed expression of less than 1%.  Scans in May 2020 showed progression of disease in the left lung as well as new right acetabular metastases. Patient received palliative radiation for his bone mets.Carboplatin, Taxol and Avastin chemotherapy started on 11/02/2018  Interval history-overall he is doing poorly.  Appetite is poor and he continues to lose weight.  States that he is getting some taste buds back.  Reports pain around his left lower rib cage which radiates to the right shoulder blade as well.  Reports some cough with clear mucus expectoration.  No fever.  Does not wish to adjust his pain medications at this time  ECOG PS- 2 Pain scale- 3 Opioid associated constipation- no  Review of systems- Review of Systems  Constitutional: Positive for malaise/fatigue and weight loss. Negative for chills and fever.  HENT: Negative for congestion, ear discharge and nosebleeds.   Eyes: Negative for blurred vision.  Respiratory: Negative for cough, hemoptysis, sputum production, shortness of breath and wheezing.        Left chest wall pain  Cardiovascular: Negative  for chest pain, palpitations, orthopnea and claudication.  Gastrointestinal: Negative for abdominal pain, blood in  stool, constipation, diarrhea, heartburn, melena, nausea and vomiting.  Genitourinary: Negative for dysuria, flank pain, frequency, hematuria and urgency.  Musculoskeletal: Negative for back pain, joint pain and myalgias.  Skin: Negative for rash.  Neurological: Negative for dizziness, tingling, focal weakness, seizures, weakness and headaches.  Endo/Heme/Allergies: Does not bruise/bleed easily.  Psychiatric/Behavioral: Negative for depression and suicidal ideas. The patient does not have insomnia.       Allergies  Allergen Reactions   Doxycycline Swelling   Penicillins Swelling    Did it involve swelling of the face/tongue/throat, SOB, or low BP? Unknown Did it involve sudden or severe rash/hives, skin peeling, or any reaction on the inside of your mouth or nose? Unknown Did you need to seek medical attention at a hospital or doctor's office? Unknown When did it last happen?Childhood If all above answers are NO, may proceed with cephalosporin use.     Past Medical History:  Diagnosis Date   Cancer ([place])    COPD (chronic obstructive pulmonary disease) ([place])    Lung mass      Past Surgical History:  Procedure Laterality Date   ELECTROMAGNETIC NAVIGATION BROCHOSCOPY Left 10/13/2018   Procedure: ELECTROMAGNETIC NAVIGATION BRONCHOSCOPY - LEFT;  Surgeon: Tyler Pita, MD;  Location: [facility];  Service: Cardiopulmonary;  Laterality: Left;   ENDOBRONCHIAL ULTRASOUND N/A 03/11/2018   Procedure: ENDOBRONCHIAL ULTRASOUND;  Surgeon: Tyler Pita, MD;  Location: [facility];  Service: Cardiopulmonary;  Laterality: N/A;   ENDOBRONCHIAL ULTRASOUND Left 10/13/2018   Procedure: ENDOBRONCHIAL ULTRASOUND - LEFT;  Surgeon: Tyler Pita, MD;  Location: [facility];  Service: Cardiopulmonary;  Laterality: Left;   none     PORTA CATH INSERTION N/A 03/12/2018   Procedure: PORTA CATH INSERTION, thigh;  Surgeon: Katha Cabal, MD;  Location: [place] [facility];   Service: Cardiovascular;  Laterality: N/A;   SUBXYPHOID PERICARDIAL WINDOW N/A 06/30/2018   Procedure: SUBXYPHOID PERICARDIAL WINDOW;  Surgeon: [name], MD;  Location: [place];  Service: Open Heart Surgery;  Laterality: N/A;   TEE WITHOUT CARDIOVERSION N/A 06/30/2018   Procedure: TRANSESOPHAGEAL ECHOCARDIOGRAM (TEE);  Surgeon: [name], MD;  Location: [place];  Service: Open Heart Surgery;  Laterality: N/A;    Social History   Socioeconomic History   Marital status: Divorced    Spouse name: Not on file   Number of children: Not on file   Years of education: Not on file   Highest education level: Not on file  Occupational History   Occupation: Cabin crew  Social Needs   Financial resource strain: Not on file   Food insecurity    Worry: Not on file    Inability: Not on file   Transportation needs    Medical: Not on file    Non-medical: Not on file  Tobacco Use   Smoking status: Current Every Day Smoker    Packs/day: 0.25   Smokeless tobacco: Never Used   Tobacco comment: 4-5 cig a day  Substance and Sexual Activity   Alcohol use: Not Currently   Drug use: Not Currently    Types: Marijuana   Sexual activity: Not Currently  Lifestyle   Physical activity    Days per week: Not on file    Minutes per session: Not on file   Stress: Not on file  Relationships   Social connections    Talks on phone: Not on file  Gets together: Not on file    Attends religious service: Not on file    Active member of club or organization: Not on file    Attends meetings of clubs or organizations: Not on file    Relationship status: Not on file   Intimate partner violence    Fear of current or ex partner: Not on file    Emotionally abused: Not on file    Physically abused: Not on file    Forced sexual activity: Not on file  Other Topics Concern   Not on file  Social History Narrative   Not on file    History reviewed. No pertinent family  history.   Current Outpatient Medications:    ELIQUIS 5 MG TABS tablet, TAKE 1 TABLET BY MOUTH TWICE DAILY, Disp: 180 tablet, Rfl: 3   guaiFENesin (MUCINEX) 600 MG 12 hr tablet, Take 600 mg by mouth 2 (two) times daily as needed., Disp: , Rfl:    lidocaine-prilocaine (EMLA) cream, Apply to affected area once, Disp: 30 g, Rfl: 3   OLANZapine (ZYPREXA) 10 MG tablet, Take 1 tablet (10 mg total) by mouth at bedtime., Disp: 30 tablet, Rfl: 0   ondansetron (ZOFRAN) 8 MG tablet, Take 1 tablet (8 mg total) by mouth 2 (two) times daily as needed for refractory nausea / vomiting. Start on day 3 after carboplatin chemo., Disp: 30 tablet, Rfl: 1   polyethylene glycol (MIRALAX / GLYCOLAX) 17 g packet, Take 17 g by mouth 2 (two) times a week., Disp: , Rfl:    prochlorperazine (COMPAZINE) 10 MG tablet, Take 1 tablet (10 mg total) by mouth every 6 (six) hours as needed (Nausea or vomiting)., Disp: 30 tablet, Rfl: 1   Ipratropium-Albuterol (COMBIVENT RESPIMAT) 20-100 MCG/ACT AERS respimat, Inhale 1 puff into the lungs every 6 (six) hours as needed for wheezing or shortness of breath., Disp: 4 g, Rfl: 2   levofloxacin (LEVAQUIN) 500 MG tablet, Take 1 tablet (500 mg total) by mouth daily., Disp: 10 tablet, Rfl: 0   megestrol (MEGACE) 400 MG/10ML suspension, Take 10 mLs (400 mg total) by mouth 2 (two) times daily. (Patient not taking: Reported on 12/14/2018), Disp: 240 mL, Rfl: 0   Oxycodone HCl 10 MG TABS, Take 1 tablet (10 mg total) by mouth every 4 (four) hours as needed., Disp: 120 tablet, Rfl: 0 No current facility-administered medications for this visit.   Facility-Administered Medications Ordered in Other Visits:    sodium chloride flush (NS) 0.9 % injection 10 mL, 10 mL, Intravenous, Once, Sindy Guadeloupe, MD  Physical exam:  Vitals:   01/25/19 1004  BP: 93/77  Pulse: (!) 102  Resp: 16  Temp: 99.8 F (37.7 C)  TempSrc: Tympanic  Weight: 107 lb 4.8 oz (48.7 kg)  Height: 5\' 9"  (1.753 m)    Physical Exam Constitutional:      Comments: Patient is thin and cachectic.  Appears in no acute distress  HENT:     Head: Normocephalic and atraumatic.  Eyes:     Pupils: Pupils are equal, round, and reactive to light.  Neck:     Musculoskeletal: Normal range of motion.  Cardiovascular:     Rate and Rhythm: Normal rate and regular rhythm.     Heart sounds: Normal heart sounds.  Pulmonary:     Effort: Pulmonary effort is normal.     Breath sounds: Normal breath sounds.  Abdominal:     General: Bowel sounds are normal.     Palpations: Abdomen is soft.  Skin:    General: Skin is warm and dry.  Neurological:     Mental Status: He is alert and oriented to person, place, and time.      CMP Latest Ref Rng & Units 01/25/2019  Glucose 70 - 99 mg/dL 125(H)  BUN 6 - 20 mg/dL 18  Creatinine 0.61 - 1.24 mg/dL 1.06  Sodium 135 - 145 mmol/L 137  Potassium 3.5 - 5.1 mmol/L 3.9  Chloride 98 - 111 mmol/L 102  CO2 22 - 32 mmol/L 28  Calcium 8.9 - 10.3 mg/dL 8.0(L)  Total Protein 6.5 - 8.1 g/dL 7.0  Total Bilirubin 0.3 - 1.2 mg/dL 0.4  Alkaline Phos 38 - 126 U/L 86  AST 15 - 41 U/L 13(L)  ALT 0 - 44 U/L 9   CBC Latest Ref Rng & Units 01/25/2019  WBC 4.0 - 10.5 K/uL 17.3(H)  Hemoglobin 13.0 - 17.0 g/dL 7.9(L)  Hematocrit 39.0 - 52.0 % 25.1(L)  Platelets 150 - 400 K/uL 65(L)    No images are attached to the encounter.  Ct Chest W Contrast  Result Date: 01/11/2019 CLINICAL DATA:  Lung cancer.  Restaging. EXAM: CT CHEST, ABDOMEN, AND PELVIS WITH CONTRAST TECHNIQUE: Multidetector CT imaging of the chest, abdomen and pelvis was performed following the standard protocol during bolus administration of intravenous contrast. CONTRAST:  69mL OMNIPAQUE IOHEXOL 300 MG/ML  SOLN COMPARISON:  PET-CT 09/28/2018.  Chest CT 10/11/2018 FINDINGS: CT CHEST FINDINGS Cardiovascular: The heart size is normal. No substantial pericardial effusion. No thoracic aortic aneurysm. Mediastinum/Nodes: Ill-defined  right paratracheal soft tissue measured previously at 2.1 x 1.7 cm measures 2.0 x 1.5 cm today. Abnormal soft tissue in the right hilum is 1.3 cm short axis (30/2), stable in the interval. Mild circumferential wall thickening noted mid and distal esophagus, as before. There is no axillary lymphadenopathy. Lungs/Pleura: Centrilobular emphsyema noted. 1.1 x 1.0 cm nodule medial right upper lobe is similar to prior. Bronchial wall thickening with tree-in-bud opacity in the right upper lobe, right middle lobe, and right lower lobe has progressed in the interval. Some of the nodularity in the right upper lobe has become confluent, measuring 1.3 x 0.6 cm on 80/3. The 2.9 x 2.6 cm central left lower lobe lesion has evolved in the interval, now extending in the left lower lobe to the lateral pleura. Lesion today measures 4.9 x 3.4 cm in shows central cavitation with adjacent pleural thickening. Musculoskeletal: No worrisome lytic or sclerotic osseous abnormality. CT ABDOMEN PELVIS FINDINGS Hepatobiliary: No suspicious focal abnormality within the liver parenchyma. There is no evidence for gallstones, gallbladder wall thickening, or pericholecystic fluid. No intrahepatic or extrahepatic biliary dilation. Pancreas: No focal mass lesion. No dilatation of the main duct. No intraparenchymal cyst. No peripancreatic edema. Spleen: 1.5 cm fluid density lesion is identified between the proximal stomach and the spleen (67/2). This appears new in the interval. Adrenals/Urinary Tract: Adrenal thickening without nodularity evident. Kidneys unremarkable. No evidence for hydroureter. The urinary bladder appears normal for the degree of distention. Stomach/Bowel: Stomach is unremarkable. No gastric wall thickening. No evidence of outlet obstruction. Duodenum is normally positioned as is the ligament of Treitz. No small bowel wall thickening. No small bowel dilatation. No gross colonic mass. No colonic wall thickening. Vascular/Lymphatic:  There is abdominal aortic atherosclerosis without aneurysm. There is no gastrohepatic or hepatoduodenal ligament lymphadenopathy. No intraperitoneal or retroperitoneal lymphadenopathy. No pelvic sidewall lymphadenopathy. Right common femoral venous catheter tip is positioned in the infra hepatic IVC. Reproductive: Unremarkable. Other: No intraperitoneal  free fluid. Musculoskeletal: Similar appearance of the small lytic lesion in the right acetabulum (111/2). Sclerotic focus in the left sacrum is stable. IMPRESSION: 1. Interval evolution of the left lower lobe lesion, now larger and extending to the lateral pleura. The mass demonstrates central cavitation and measures 4.9 x 3.4 cm. Disease progression cannot be excluded. 2. Interval development of diffuse tree-in-bud nodularity in all 3 lobes of the right lung, but most pronounced in the right upper lobe. Features probably reflect atypical infection with some confluent nodular areas measuring up to 1.3 cm. Close follow-up recommended. 3. Posterior tree-in-bud opacity in the superior segment left lower lobe also likely atypical infection although endobronchial spread of disease cannot be excluded. 4. Similar appearance of ill-defined abnormal soft tissue in the right parahilar region and right hilum. 5. Stable lytic metastatic lesion right acetabulum. Electronically Signed   By: Misty Stanley M.D.   On: 01/11/2019 09:51   Nm Bone Scan Whole Body  Result Date: 01/13/2019 CLINICAL DATA:  Stage IV lung cancer, bone metastases, on chemotherapy, question response to treatment EXAM: NUCLEAR MEDICINE WHOLE BODY BONE SCAN TECHNIQUE: Whole body anterior and posterior images were obtained approximately 3 hours after intravenous injection of radiopharmaceutical. RADIOPHARMACEUTICALS:  21.20 mCi Technetium-23m MDP IV COMPARISON:  None Correlation: CT chest abdomen pelvis 01/09/2019, PET-CT 09/28/2018 FINDINGS: Normal ectatic of tracer within the axial and appendicular  skeleton. No scintigraphic evidence of osseous metastatic disease. Specifically, no abnormal uptake of tracer is seen at the site of abnormal FDG accumulation at the RIGHT acetabulum identified on the prior PET-CT. Expected urinary tract and soft tissue distribution of tracer. IMPRESSION: No definite scintigraphic evidence of osseous metastatic disease. Electronically Signed   By: Lavonia Dana M.D.   On: 01/13/2019 16:36   Ct Abdomen Pelvis W Contrast  Result Date: 01/11/2019 CLINICAL DATA:  Lung cancer.  Restaging. EXAM: CT CHEST, ABDOMEN, AND PELVIS WITH CONTRAST TECHNIQUE: Multidetector CT imaging of the chest, abdomen and pelvis was performed following the standard protocol during bolus administration of intravenous contrast. CONTRAST:  2mL OMNIPAQUE IOHEXOL 300 MG/ML  SOLN COMPARISON:  PET-CT 09/28/2018.  Chest CT 10/11/2018 FINDINGS: CT CHEST FINDINGS Cardiovascular: The heart size is normal. No substantial pericardial effusion. No thoracic aortic aneurysm. Mediastinum/Nodes: Ill-defined right paratracheal soft tissue measured previously at 2.1 x 1.7 cm measures 2.0 x 1.5 cm today. Abnormal soft tissue in the right hilum is 1.3 cm short axis (30/2), stable in the interval. Mild circumferential wall thickening noted mid and distal esophagus, as before. There is no axillary lymphadenopathy. Lungs/Pleura: Centrilobular emphsyema noted. 1.1 x 1.0 cm nodule medial right upper lobe is similar to prior. Bronchial wall thickening with tree-in-bud opacity in the right upper lobe, right middle lobe, and right lower lobe has progressed in the interval. Some of the nodularity in the right upper lobe has become confluent, measuring 1.3 x 0.6 cm on 80/3. The 2.9 x 2.6 cm central left lower lobe lesion has evolved in the interval, now extending in the left lower lobe to the lateral pleura. Lesion today measures 4.9 x 3.4 cm in shows central cavitation with adjacent pleural thickening. Musculoskeletal: No worrisome lytic  or sclerotic osseous abnormality. CT ABDOMEN PELVIS FINDINGS Hepatobiliary: No suspicious focal abnormality within the liver parenchyma. There is no evidence for gallstones, gallbladder wall thickening, or pericholecystic fluid. No intrahepatic or extrahepatic biliary dilation. Pancreas: No focal mass lesion. No dilatation of the main duct. No intraparenchymal cyst. No peripancreatic edema. Spleen: 1.5 cm fluid density lesion is  identified between the proximal stomach and the spleen (67/2). This appears new in the interval. Adrenals/Urinary Tract: Adrenal thickening without nodularity evident. Kidneys unremarkable. No evidence for hydroureter. The urinary bladder appears normal for the degree of distention. Stomach/Bowel: Stomach is unremarkable. No gastric wall thickening. No evidence of outlet obstruction. Duodenum is normally positioned as is the ligament of Treitz. No small bowel wall thickening. No small bowel dilatation. No gross colonic mass. No colonic wall thickening. Vascular/Lymphatic: There is abdominal aortic atherosclerosis without aneurysm. There is no gastrohepatic or hepatoduodenal ligament lymphadenopathy. No intraperitoneal or retroperitoneal lymphadenopathy. No pelvic sidewall lymphadenopathy. Right common femoral venous catheter tip is positioned in the infra hepatic IVC. Reproductive: Unremarkable. Other: No intraperitoneal free fluid. Musculoskeletal: Similar appearance of the small lytic lesion in the right acetabulum (111/2). Sclerotic focus in the left sacrum is stable. IMPRESSION: 1. Interval evolution of the left lower lobe lesion, now larger and extending to the lateral pleura. The mass demonstrates central cavitation and measures 4.9 x 3.4 cm. Disease progression cannot be excluded. 2. Interval development of diffuse tree-in-bud nodularity in all 3 lobes of the right lung, but most pronounced in the right upper lobe. Features probably reflect atypical infection with some confluent  nodular areas measuring up to 1.3 cm. Close follow-up recommended. 3. Posterior tree-in-bud opacity in the superior segment left lower lobe also likely atypical infection although endobronchial spread of disease cannot be excluded. 4. Similar appearance of ill-defined abnormal soft tissue in the right parahilar region and right hilum. 5. Stable lytic metastatic lesion right acetabulum. Electronically Signed   By: Misty Stanley M.D.   On: 01/11/2019 09:51     Assessment and plan- Patient is a 53 y.o. male of stage IIIb likely lung adenocarcinoma T3 N2 M0 presenting as SVC syndrome.He is status post concurrent chemoradiation with cisplatin and Alimta. He developed pericardial effusion after 1 dose of durvalumab and was not rechallenged. Scans in May 2020 showed progression of disease in the left lung as well as right acetabular metastases. He is s/p palliative radiation to his right hip.  He is here for on treatment assessment prior to cycle 4 of carbotaxol Avastin.  He is here to discuss CT scan results and further management  I have reviewed CT chest abdomen pelvis as well as bone scan results independently.  Although his primary right paratracheal soft tissue mass as well as the right upper lobe lesion is stable to smaller there is interval growth in the size of his left lower lobe lesion from 2.9 to 4.9 cm.  There is also tree-in-bud nodularity seen in 3 lobes of the right lung and it is unclear if this represents infection versus malignancy.  Metastatic lesion in the right acetabulum is stable.  Overall the main growth has been in the size of his left lower lobe lesion and I will therefore get in touch with radiation oncology to see if SBRT to this area is a possibility.  Patient is significantly deconditioned after carbotaxol and Avastin 4 cycles.  Overall his prognosis is poor given his declining performance status and cancer related cachexia.  He is continuing to see palliative care and will be  filling out his most form soon.  Given that he is significantly pancytopenic with his anemia and thrombocytopenia I will hold his treatment today.  I will give empiric Levaquin for 10 days given the tree-in-bud nodularity seen on the CT scan.  I will see him back in 10 days time with CBC with differential, CMP  and urine protein to possibly restart a Avastin maintenance at that time.  Patient will also receive Xgeva on that day   Visit Diagnosis 1. Bone metastases ([place])   2. Adenocarcinoma, lung, right ([place])   3. Antineoplastic chemotherapy induced pancytopenia (CODE) ([facility])      Dr. Randa Evens, MD, MPH [facility] [phone] 01/28/2019 8:07 AM

## 2019-02-03 ENCOUNTER — Other Ambulatory Visit: Payer: Self-pay

## 2019-02-03 ENCOUNTER — Encounter: Payer: Self-pay | Admitting: Hospice and Palliative Medicine

## 2019-02-03 NOTE — Progress Notes (Signed)
Patient had lost his taste buds. Patient denied fever, chills, nausea, vomiting, diarrhea, constipation, SOB or sore throat.

## 2019-02-03 NOTE — Progress Notes (Signed)
Patient is coming in for follow up he mentions he has no taste but eating is getting better. No fevers no covid symptoms.

## 2019-02-04 ENCOUNTER — Inpatient Hospital Stay (HOSPITAL_BASED_OUTPATIENT_CLINIC_OR_DEPARTMENT_OTHER): Payer: Medicaid Other | Admitting: Oncology

## 2019-02-04 ENCOUNTER — Other Ambulatory Visit: Payer: Self-pay

## 2019-02-04 ENCOUNTER — Inpatient Hospital Stay (HOSPITAL_BASED_OUTPATIENT_CLINIC_OR_DEPARTMENT_OTHER): Payer: Medicaid Other | Admitting: Hospice and Palliative Medicine

## 2019-02-04 ENCOUNTER — Inpatient Hospital Stay: Payer: Medicaid Other

## 2019-02-04 VITALS — BP 110/81 | HR 125 | Temp 98.7°F | Resp 16 | Wt 107.5 lb

## 2019-02-04 DIAGNOSIS — Z5112 Encounter for antineoplastic immunotherapy: Secondary | ICD-10-CM

## 2019-02-04 DIAGNOSIS — C7951 Secondary malignant neoplasm of bone: Secondary | ICD-10-CM

## 2019-02-04 DIAGNOSIS — G893 Neoplasm related pain (acute) (chronic): Secondary | ICD-10-CM | POA: Diagnosis not present

## 2019-02-04 DIAGNOSIS — D6481 Anemia due to antineoplastic chemotherapy: Secondary | ICD-10-CM

## 2019-02-04 DIAGNOSIS — E43 Unspecified severe protein-calorie malnutrition: Secondary | ICD-10-CM | POA: Diagnosis not present

## 2019-02-04 DIAGNOSIS — C3491 Malignant neoplasm of unspecified part of right bronchus or lung: Secondary | ICD-10-CM

## 2019-02-04 DIAGNOSIS — Z515 Encounter for palliative care: Secondary | ICD-10-CM

## 2019-02-04 DIAGNOSIS — D61810 Antineoplastic chemotherapy induced pancytopenia: Secondary | ICD-10-CM

## 2019-02-04 DIAGNOSIS — T451X5A Adverse effect of antineoplastic and immunosuppressive drugs, initial encounter: Secondary | ICD-10-CM

## 2019-02-04 DIAGNOSIS — Z7983 Long term (current) use of bisphosphonates: Secondary | ICD-10-CM

## 2019-02-04 DIAGNOSIS — C3411 Malignant neoplasm of upper lobe, right bronchus or lung: Secondary | ICD-10-CM

## 2019-02-04 LAB — CBC WITH DIFFERENTIAL/PLATELET
Abs Immature Granulocytes: 0.25 10*3/uL — ABNORMAL HIGH (ref 0.00–0.07)
Basophils Absolute: 0 10*3/uL (ref 0.0–0.1)
Basophils Relative: 0 %
Eosinophils Absolute: 0 10*3/uL (ref 0.0–0.5)
Eosinophils Relative: 0 %
HCT: 24.9 % — ABNORMAL LOW (ref 39.0–52.0)
Hemoglobin: 7.8 g/dL — ABNORMAL LOW (ref 13.0–17.0)
Immature Granulocytes: 2 %
Lymphocytes Relative: 5 %
Lymphs Abs: 0.8 10*3/uL (ref 0.7–4.0)
MCH: 29.2 pg (ref 26.0–34.0)
MCHC: 31.3 g/dL (ref 30.0–36.0)
MCV: 93.3 fL (ref 80.0–100.0)
Monocytes Absolute: 0.8 10*3/uL (ref 0.1–1.0)
Monocytes Relative: 5 %
Neutro Abs: 13.6 10*3/uL — ABNORMAL HIGH (ref 1.7–7.7)
Neutrophils Relative %: 88 %
Platelets: 115 10*3/uL — ABNORMAL LOW (ref 150–400)
RBC: 2.67 MIL/uL — ABNORMAL LOW (ref 4.22–5.81)
RDW: 20.4 % — ABNORMAL HIGH (ref 11.5–15.5)
WBC: 15.6 10*3/uL — ABNORMAL HIGH (ref 4.0–10.5)
nRBC: 0 % (ref 0.0–0.2)

## 2019-02-04 LAB — COMPREHENSIVE METABOLIC PANEL
ALT: 11 U/L (ref 0–44)
AST: 22 U/L (ref 15–41)
Albumin: 2.6 g/dL — ABNORMAL LOW (ref 3.5–5.0)
Alkaline Phosphatase: 113 U/L (ref 38–126)
Anion gap: 9 (ref 5–15)
BUN: 15 mg/dL (ref 6–20)
CO2: 27 mmol/L (ref 22–32)
Calcium: 7.7 mg/dL — ABNORMAL LOW (ref 8.9–10.3)
Chloride: 99 mmol/L (ref 98–111)
Creatinine, Ser: 1.06 mg/dL (ref 0.61–1.24)
GFR calc Af Amer: >60 mL/min (ref >60)
GFR calc non Af Amer: >60 mL/min (ref >60)
Glucose, Bld: 111 mg/dL — ABNORMAL HIGH (ref 70–99)
Potassium: 3.7 mmol/L (ref 3.5–5.1)
Sodium: 135 mmol/L (ref 135–145)
Total Bilirubin: 0.5 mg/dL (ref 0.3–1.2)
Total Protein: 7.1 g/dL (ref 6.5–8.1)

## 2019-02-04 LAB — PROTEIN, URINE, RANDOM: Total Protein, Urine: 66 mg/dL

## 2019-02-04 MED ORDER — SODIUM CHLORIDE 0.9 % IV SOLN
Freq: Once | INTRAVENOUS | Status: AC
  Administered 2019-02-04: 11:00:00 via INTRAVENOUS
  Filled 2019-02-04: qty 250

## 2019-02-04 MED ORDER — FAMOTIDINE IN NACL 20-0.9 MG/50ML-% IV SOLN: 20.0000 mg | Freq: Once | INTRAVENOUS | Status: DC

## 2019-02-04 MED ORDER — HEPARIN SOD (PORK) LOCK FLUSH 100 UNIT/ML IV SOLN
500.0000 [IU] | Freq: Once | INTRAVENOUS | Status: AC
  Administered 2019-02-04: 12:00:00 500 [IU] via INTRAVENOUS
  Filled 2019-02-04: qty 5

## 2019-02-04 MED ORDER — DIPHENHYDRAMINE HCL 50 MG/ML IJ SOLN: 50.0000 mg | Freq: Once | INTRAMUSCULAR | Status: DC

## 2019-02-04 MED ORDER — SODIUM CHLORIDE 0.9% FLUSH
10.0000 mL | Freq: Once | INTRAVENOUS | Status: AC
  Administered 2019-02-04: 09:00:00 10 mL via INTRAVENOUS
  Filled 2019-02-04: qty 10

## 2019-02-04 MED ORDER — SODIUM CHLORIDE 0.9 % IV SOLN
Freq: Once | INTRAVENOUS | Status: AC
  Administered 2019-02-04: 12:00:00 via INTRAVENOUS
  Filled 2019-02-04: qty 250

## 2019-02-04 MED ORDER — PALONOSETRON HCL INJECTION 0.25 MG/5ML: 0.2500 mg | Freq: Once | INTRAVENOUS | Status: DC

## 2019-02-04 MED ORDER — SODIUM CHLORIDE 0.9 % IV SOLN
Freq: Once | INTRAVENOUS | Status: AC
  Administered 2019-02-04: 12:00:00 via INTRAVENOUS
  Filled 2019-02-04: qty 100

## 2019-02-04 MED ORDER — SODIUM CHLORIDE 0.9 % IV SOLN: Freq: Once | INTRAVENOUS | Status: DC

## 2019-02-04 NOTE — Progress Notes (Signed)
He eats but not as good as he should, today he has not ate but after every chemo he goes to [employer].

## 2019-02-04 NOTE — Progress Notes (Signed)
[place]  [phone] [phone]   Name: Alexander [name] Date: 02/04/2019 MRN: [id number]  DOB: 1966/01/30  Patient Care Team: Patient, No Pcp Per as PCP - General ([place]) Telford Nab, RN as Registered Nurse    REASON FOR CONSULTATION: Palliative Care consult requested for this 53 y.o. 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  Most recent scans in May 2020 showed progression of the disease in the left lung with new right acetabular metastases.  Patient was referred to palliative care to help address goals and manage ongoing symptoms.   SOCIAL HISTORY:     reports that he has been smoking. He has been smoking about 0.25 packs per day. He has never used smokeless tobacco. He reports previous alcohol use. He reports previous drug use. Drug: Marijuana.   Patient is not married.  He has no children.  Both parents are deceased.  He has a stepbrother.  Patient lives at home alone.  He has support of several friends who are involved in his care.  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  ADVANCE DIRECTIVES:  Does not have  CODE STATUS: DNR  PAST MEDICAL HISTORY: Past Medical History:  Diagnosis Date  . Cancer ([place])   . COPD (chronic obstructive pulmonary disease) ([place])   . Lung mass     PAST SURGICAL HISTORY:  Past Surgical History:  Procedure Laterality Date  . ELECTROMAGNETIC NAVIGATION BROCHOSCOPY Left 10/13/2018   Procedure: ELECTROMAGNETIC NAVIGATION BRONCHOSCOPY - LEFT;  Surgeon: Tyler Pita, MD;  Location: [facility];  Service: Cardiopulmonary;  Laterality: Left;  . ENDOBRONCHIAL ULTRASOUND N/A 03/11/2018   Procedure: ENDOBRONCHIAL ULTRASOUND;  Surgeon: Tyler Pita, MD;   Location: [facility];  Service: Cardiopulmonary;  Laterality: N/A;  . ENDOBRONCHIAL ULTRASOUND Left 10/13/2018   Procedure: ENDOBRONCHIAL ULTRASOUND - LEFT;  Surgeon: Tyler Pita, MD;  Location: [facility];  Service: Cardiopulmonary;  Laterality: Left;  . none    . PORTA CATH INSERTION N/A 03/12/2018   Procedure: PORTA CATH INSERTION, thigh;  Surgeon: Katha Cabal, MD;  Location: [facility];  Service: Cardiovascular;  Laterality: N/A;  . SUBXYPHOID PERICARDIAL WINDOW N/A 06/30/2018   Procedure: SUBXYPHOID PERICARDIAL WINDOW;  Surgeon: [name], MD;  Location: [facility];  Service: Open Heart Surgery;  Laterality: N/A;  . TEE WITHOUT CARDIOVERSION N/A 06/30/2018   Procedure: TRANSESOPHAGEAL ECHOCARDIOGRAM (TEE);  Surgeon: [name], MD;  Location: [facility];  Service: Open Heart Surgery;  Laterality: N/A;    HEMATOLOGY/ONCOLOGY HISTORY:  Oncology History  Lung cancer ([place])  03/16/2018 Initial Diagnosis   Lung cancer ([place])   03/16/2018 Cancer Staging   Staging form: Lung, AJCC 8th Edition - Clinical stage from 03/16/2018: Stage IIIB (cT3, cN2, cM0) - Signed by Sindy Guadeloupe, MD on 03/16/2018   03/17/2018 - 06/09/2018 Chemotherapy   The patient had palonosetron (ALOXI) injection 0.25 mg, 0.25 mg, Intravenous,  Once, 4 of 4 cycles Administration: 0.25 mg (03/17/2018), 0.25 mg (04/29/2018), 0.25 mg (05/20/2018), 0.25 mg (04/08/2018) PEMEtrexed (ALIMTA) 900 mg in sodium chloride 0.9 % 100 mL chemo infusion, 510 mg/m2 = 875 mg, Intravenous,  Once, 4 of 4 cycles Administration: 900 mg (03/17/2018), 900 mg (04/29/2018), 900 mg (05/20/2018), 900 mg (04/08/2018) CISplatin (PLATINOL) 131 mg in sodium chloride 0.9 % 500  mL chemo infusion, 75 mg/m2 = 131 mg, Intravenous,  Once, 4 of 4 cycles Administration: 131 mg (03/17/2018), 131 mg (04/29/2018), 131 mg (05/20/2018), 131 mg (04/08/2018) fosaprepitant (EMEND) 150 mg, dexamethasone (DECADRON) 12 mg in sodium chloride 0.9 % 145 mL  IVPB, , Intravenous,  Once, 4 of 4 cycles Administration:  (03/17/2018),  (04/29/2018),  (05/20/2018),  (04/08/2018)  for chemotherapy treatment.    06/17/2018 - 06/30/2018 Chemotherapy   The patient had durvalumab (IMFINZI) 620 mg in sodium chloride 0.9 % 100 mL chemo infusion, 580 mg, Intravenous,  Once, 1 of 6 cycles  for chemotherapy treatment.    11/02/2018 -  Chemotherapy   The patient had palonosetron (ALOXI) injection 0.25 mg, 0.25 mg, Intravenous,  Once, 5 of 6 cycles Administration: 0.25 mg (11/02/2018), 0.25 mg (11/23/2018), 0.25 mg (12/14/2018), 0.25 mg (01/04/2019) pegfilgrastim (NEULASTA ONPRO KIT) injection 6 mg, 6 mg, Subcutaneous, Once, 4 of 4 cycles Administration: 6 mg (11/02/2018), 6 mg (11/23/2018), 6 mg (12/14/2018), 6 mg (01/04/2019) bevacizumab (AVASTIN) 800 mg in sodium chloride 0.9 % 100 mL chemo infusion, 13.4 mg/kg = 900 mg, Intravenous,  Once, 3 of 3 cycles Administration: 800 mg (11/02/2018), 800 mg (11/23/2018), 800 mg (12/14/2018) CARBOplatin (PARAPLATIN) 510 mg in sodium chloride 0.9 % 250 mL chemo infusion, 510 mg (133.6 % of original dose 379.5 mg), Intravenous,  Once, 4 of 4 cycles Dose modification:   (original dose 379.5 mg, Cycle 1) Administration: 510 mg (11/02/2018), 430 mg (11/23/2018), 500 mg (12/14/2018) PACLitaxel (TAXOL) 300 mg in sodium chloride 0.9 % 250 mL chemo infusion (> 16m/m2), 175 mg/m2 = 300 mg (100 % of original dose 175 mg/m2), Intravenous,  Once, 4 of 4 cycles Dose modification: 175 mg/m2 (original dose 175 mg/m2, Cycle 1, Reason: Other (see comments), Comment: baseline anemia) Administration: 300 mg (11/02/2018), 300 mg (11/23/2018), 300 mg (12/14/2018), 300 mg (01/04/2019) Bevacizumab-bvzr 800 mg in sodium chloride 0.9 % 100 mL chemo infusion, , Intravenous, Once, 2 of 3 cycles Administration:  (02/04/2019) fosaprepitant (EMEND) 150 mg, dexamethasone (DECADRON) 12 mg in sodium chloride 0.9 % 145 mL IVPB, , Intravenous,  Once, 5 of 6 cycles Administration:   (11/02/2018),  (11/23/2018),  (12/14/2018),  (01/04/2019)  for chemotherapy treatment.      ALLERGIES:  is allergic to doxycycline and penicillins.  MEDICATIONS:  Current Outpatient Medications  Medication Sig Dispense Refill  . ELIQUIS 5 MG TABS tablet TAKE 1 TABLET BY MOUTH TWICE DAILY 180 tablet 3  . guaiFENesin (MUCINEX) 600 MG 12 hr tablet Take 600 mg by mouth 2 (two) times daily as needed.    . Ipratropium-Albuterol (COMBIVENT RESPIMAT) 20-100 MCG/ACT AERS respimat Inhale 1 puff into the lungs every 6 (six) hours as needed for wheezing or shortness of breath. 4 g 2  . levofloxacin (LEVAQUIN) 500 MG tablet Take 1 tablet (500 mg total) by mouth daily. 10 tablet 0  . lidocaine-prilocaine (EMLA) cream Apply to affected area once 30 g 3  . OLANZapine (ZYPREXA) 10 MG tablet Take 1 tablet (10 mg total) by mouth at bedtime. 30 tablet 0  . ondansetron (ZOFRAN) 8 MG tablet Take 1 tablet (8 mg total) by mouth 2 (two) times daily as needed for refractory nausea / vomiting. Start on day 3 after carboplatin chemo. 30 tablet 1  . Oxycodone HCl 10 MG TABS Take 1 tablet (10 mg total) by mouth every 4 (four) hours as needed. 120 tablet 0  . polyethylene glycol (MIRALAX / GLYCOLAX) 17 g packet Take 17 g by mouth 2 (  two) times a week.    . prochlorperazine (COMPAZINE) 10 MG tablet Take 1 tablet (10 mg total) by mouth every 6 (six) hours as needed (Nausea or vomiting). 30 tablet 1  . megestrol (MEGACE) 400 MG/10ML suspension Take 10 mLs (400 mg total) by mouth 2 (two) times daily. (Patient not taking: Reported on 02/03/2019) 240 mL 0   No current facility-administered medications for this visit.    Facility-Administered Medications Ordered in Other Visits  Medication Dose Route Frequency Provider Last Rate Last Dose  . sodium chloride flush (NS) 0.9 % injection 10 mL  10 mL Intravenous Once Sindy Guadeloupe, MD        VITAL SIGNS: There were no vitals taken for this visit. There were no vitals filed for this  visit.  Estimated body mass index is 15.87 kg/m as calculated from the following:   Height as of 01/25/19: 5' 9"  (1.753 m).   Weight as of an earlier encounter on 02/04/19: 107 lb 8 oz (48.8 kg).  LABS: CBC:    Component Value Date/Time   WBC 15.6 (H) 02/04/2019 0904   HGB 7.8 (L) 02/04/2019 0904   HCT 24.9 (L) 02/04/2019 0904   PLT 115 (L) 02/04/2019 0904   MCV 93.3 02/04/2019 0904   NEUTROABS 13.6 (H) 02/04/2019 0904   LYMPHSABS 0.8 02/04/2019 0904   MONOABS 0.8 02/04/2019 0904   EOSABS 0.0 02/04/2019 0904   BASOSABS 0.0 02/04/2019 0904   Comprehensive Metabolic Panel:    Component Value Date/Time   NA 135 02/04/2019 0904   K 3.7 02/04/2019 0904   CL 99 02/04/2019 0904   CO2 27 02/04/2019 0904   BUN 15 02/04/2019 0904   CREATININE 1.06 02/04/2019 0904   GLUCOSE 111 (H) 02/04/2019 0904   CALCIUM 7.7 (L) 02/04/2019 0904   AST 22 02/04/2019 0904   ALT 11 02/04/2019 0904   ALKPHOS 113 02/04/2019 0904   BILITOT 0.5 02/04/2019 0904   PROT 7.1 02/04/2019 0904   ALBUMIN 2.6 (L) 02/04/2019 0904    RADIOGRAPHIC STUDIES: Ct Chest W Contrast  Result Date: 01/11/2019 CLINICAL DATA:  Lung cancer.  Restaging. EXAM: CT CHEST, ABDOMEN, AND PELVIS WITH CONTRAST TECHNIQUE: Multidetector CT imaging of the chest, abdomen and pelvis was performed following the standard protocol during bolus administration of intravenous contrast. CONTRAST:  10m OMNIPAQUE IOHEXOL 300 MG/ML  SOLN COMPARISON:  PET-CT 09/28/2018.  Chest CT 10/11/2018 FINDINGS: CT CHEST FINDINGS Cardiovascular: The heart size is normal. No substantial pericardial effusion. No thoracic aortic aneurysm. Mediastinum/Nodes: Ill-defined right paratracheal soft tissue measured previously at 2.1 x 1.7 cm measures 2.0 x 1.5 cm today. Abnormal soft tissue in the right hilum is 1.3 cm short axis (30/2), stable in the interval. Mild circumferential wall thickening noted mid and distal esophagus, as before. There is no axillary lymphadenopathy.  Lungs/Pleura: Centrilobular emphsyema noted. 1.1 x 1.0 cm nodule medial right upper lobe is similar to prior. Bronchial wall thickening with tree-in-bud opacity in the right upper lobe, right middle lobe, and right lower lobe has progressed in the interval. Some of the nodularity in the right upper lobe has become confluent, measuring 1.3 x 0.6 cm on 80/3. The 2.9 x 2.6 cm central left lower lobe lesion has evolved in the interval, now extending in the left lower lobe to the lateral pleura. Lesion today measures 4.9 x 3.4 cm in shows central cavitation with adjacent pleural thickening. Musculoskeletal: No worrisome lytic or sclerotic osseous abnormality. CT ABDOMEN PELVIS FINDINGS Hepatobiliary: No suspicious focal  abnormality within the liver parenchyma. There is no evidence for gallstones, gallbladder wall thickening, or pericholecystic fluid. No intrahepatic or extrahepatic biliary dilation. Pancreas: No focal mass lesion. No dilatation of the main duct. No intraparenchymal cyst. No peripancreatic edema. Spleen: 1.5 cm fluid density lesion is identified between the proximal stomach and the spleen (67/2). This appears new in the interval. Adrenals/Urinary Tract: Adrenal thickening without nodularity evident. Kidneys unremarkable. No evidence for hydroureter. The urinary bladder appears normal for the degree of distention. Stomach/Bowel: Stomach is unremarkable. No gastric wall thickening. No evidence of outlet obstruction. Duodenum is normally positioned as is the ligament of Treitz. No small bowel wall thickening. No small bowel dilatation. No gross colonic mass. No colonic wall thickening. Vascular/Lymphatic: There is abdominal aortic atherosclerosis without aneurysm. There is no gastrohepatic or hepatoduodenal ligament lymphadenopathy. No intraperitoneal or retroperitoneal lymphadenopathy. No pelvic sidewall lymphadenopathy. Right common femoral venous catheter tip is positioned in the infra hepatic IVC.  Reproductive: Unremarkable. Other: No intraperitoneal free fluid. Musculoskeletal: Similar appearance of the small lytic lesion in the right acetabulum (111/2). Sclerotic focus in the left sacrum is stable. IMPRESSION: 1. Interval evolution of the left lower lobe lesion, now larger and extending to the lateral pleura. The mass demonstrates central cavitation and measures 4.9 x 3.4 cm. Disease progression cannot be excluded. 2. Interval development of diffuse tree-in-bud nodularity in all 3 lobes of the right lung, but most pronounced in the right upper lobe. Features probably reflect atypical infection with some confluent nodular areas measuring up to 1.3 cm. Close follow-up recommended. 3. Posterior tree-in-bud opacity in the superior segment left lower lobe also likely atypical infection although endobronchial spread of disease cannot be excluded. 4. Similar appearance of ill-defined abnormal soft tissue in the right parahilar region and right hilum. 5. Stable lytic metastatic lesion right acetabulum. Electronically Signed   By: Misty Stanley M.D.   On: 01/11/2019 09:51   Nm Bone Scan Whole Body  Result Date: 01/13/2019 CLINICAL DATA:  Stage IV lung cancer, bone metastases, on chemotherapy, question response to treatment EXAM: NUCLEAR MEDICINE WHOLE BODY BONE SCAN TECHNIQUE: Whole body anterior and posterior images were obtained approximately 3 hours after intravenous injection of radiopharmaceutical. RADIOPHARMACEUTICALS:  21.20 mCi Technetium-53mMDP IV COMPARISON:  None Correlation: CT chest abdomen pelvis 01/09/2019, PET-CT 09/28/2018 FINDINGS: Normal ectatic of tracer within the axial and appendicular skeleton. No scintigraphic evidence of osseous metastatic disease. Specifically, no abnormal uptake of tracer is seen at the site of abnormal FDG accumulation at the RIGHT acetabulum identified on the prior PET-CT. Expected urinary tract and soft tissue distribution of tracer. IMPRESSION: No definite  scintigraphic evidence of osseous metastatic disease. Electronically Signed   By: MLavonia DanaM.D.   On: 01/13/2019 16:36   Ct Abdomen Pelvis W Contrast  Result Date: 01/11/2019 CLINICAL DATA:  Lung cancer.  Restaging. EXAM: CT CHEST, ABDOMEN, AND PELVIS WITH CONTRAST TECHNIQUE: Multidetector CT imaging of the chest, abdomen and pelvis was performed following the standard protocol during bolus administration of intravenous contrast. CONTRAST:  752mOMNIPAQUE IOHEXOL 300 MG/ML  SOLN COMPARISON:  PET-CT 09/28/2018.  Chest CT 10/11/2018 FINDINGS: CT CHEST FINDINGS Cardiovascular: The heart size is normal. No substantial pericardial effusion. No thoracic aortic aneurysm. Mediastinum/Nodes: Ill-defined right paratracheal soft tissue measured previously at 2.1 x 1.7 cm measures 2.0 x 1.5 cm today. Abnormal soft tissue in the right hilum is 1.3 cm short axis (30/2), stable in the interval. Mild circumferential wall thickening noted mid and distal esophagus, as before.  There is no axillary lymphadenopathy. Lungs/Pleura: Centrilobular emphsyema noted. 1.1 x 1.0 cm nodule medial right upper lobe is similar to prior. Bronchial wall thickening with tree-in-bud opacity in the right upper lobe, right middle lobe, and right lower lobe has progressed in the interval. Some of the nodularity in the right upper lobe has become confluent, measuring 1.3 x 0.6 cm on 80/3. The 2.9 x 2.6 cm central left lower lobe lesion has evolved in the interval, now extending in the left lower lobe to the lateral pleura. Lesion today measures 4.9 x 3.4 cm in shows central cavitation with adjacent pleural thickening. Musculoskeletal: No worrisome lytic or sclerotic osseous abnormality. CT ABDOMEN PELVIS FINDINGS Hepatobiliary: No suspicious focal abnormality within the liver parenchyma. There is no evidence for gallstones, gallbladder wall thickening, or pericholecystic fluid. No intrahepatic or extrahepatic biliary dilation. Pancreas: No focal mass  lesion. No dilatation of the main duct. No intraparenchymal cyst. No peripancreatic edema. Spleen: 1.5 cm fluid density lesion is identified between the proximal stomach and the spleen (67/2). This appears new in the interval. Adrenals/Urinary Tract: Adrenal thickening without nodularity evident. Kidneys unremarkable. No evidence for hydroureter. The urinary bladder appears normal for the degree of distention. Stomach/Bowel: Stomach is unremarkable. No gastric wall thickening. No evidence of outlet obstruction. Duodenum is normally positioned as is the ligament of Treitz. No small bowel wall thickening. No small bowel dilatation. No gross colonic mass. No colonic wall thickening. Vascular/Lymphatic: There is abdominal aortic atherosclerosis without aneurysm. There is no gastrohepatic or hepatoduodenal ligament lymphadenopathy. No intraperitoneal or retroperitoneal lymphadenopathy. No pelvic sidewall lymphadenopathy. Right common femoral venous catheter tip is positioned in the infra hepatic IVC. Reproductive: Unremarkable. Other: No intraperitoneal free fluid. Musculoskeletal: Similar appearance of the small lytic lesion in the right acetabulum (111/2). Sclerotic focus in the left sacrum is stable. IMPRESSION: 1. Interval evolution of the left lower lobe lesion, now larger and extending to the lateral pleura. The mass demonstrates central cavitation and measures 4.9 x 3.4 cm. Disease progression cannot be excluded. 2. Interval development of diffuse tree-in-bud nodularity in all 3 lobes of the right lung, but most pronounced in the right upper lobe. Features probably reflect atypical infection with some confluent nodular areas measuring up to 1.3 cm. Close follow-up recommended. 3. Posterior tree-in-bud opacity in the superior segment left lower lobe also likely atypical infection although endobronchial spread of disease cannot be excluded. 4. Similar appearance of ill-defined abnormal soft tissue in the right  parahilar region and right hilum. 5. Stable lytic metastatic lesion right acetabulum. Electronically Signed   By: Misty Stanley M.D.   On: 01/11/2019 09:51    PERFORMANCE STATUS (ECOG) : 2 - Symptomatic, <50% confined to bed  Review of Systems Unless otherwise noted, a complete review of systems is negative.  Physical Exam General: NAD, frail appearing, thin Pulmonary: Unlabored Extremities: no edema, no joint deformities Skin: no rashes Neurological: Weakness but otherwise nonfocal  IMPRESSION: Patient seen for routine follow-up.  Patient was seen in the infusion area while he received treatment.  Patient denies any changes or concerns.  He reports that his pain is stable.  He feels like he is eating a little better.  His weight appears stable at 107 pounds without note of weight loss over the past 2 weeks.  Patient brought up the MOST Form and says that he and his family still working on it.  I told him to bring it in at his convenience and we would sign it.  I called  and updated his sister-in-law, Hilda Blades.  She continues to be involved in his care.  We talked about the importance of maintaining quality of life.  They have built an old [place]velle and spent some time this week driving Mr. Glacken around.  PLAN: -Continue current scope of treatment -Oral supplements 3 times daily -MOST Form reviewed and will need to be completed during future visit -RTC in 2 weeks    Patient expressed understanding and was in agreement with this plan. He also understands that He can call the clinic at any time with any questions, concerns, or complaints.     Time Total: 15 minutes  Visit consisted of counseling and education dealing with the complex and emotionally intense issues of symptom management and palliative care in the setting of serious and potentially life-threatening illness.Greater than 50%  of this time was spent counseling and coordinating care related to the above assessment and plan.   Signed by: Altha Harm, PhD, NP-C [phone] (Work Cell)

## 2019-02-07 ENCOUNTER — Encounter: Payer: Self-pay | Admitting: Oncology

## 2019-02-07 ENCOUNTER — Ambulatory Visit
Admission: RE | Admit: 2019-02-07 | Discharge: 2019-02-07 | Disposition: A | Discharge status: Home / Self Care | Payer: Medicaid Other | Source: Ambulatory Visit | Attending: Radiation Oncology | Admitting: Radiation Oncology

## 2019-02-07 ENCOUNTER — Encounter: Payer: Self-pay | Admitting: Radiation Oncology

## 2019-02-07 ENCOUNTER — Other Ambulatory Visit: Payer: Self-pay

## 2019-02-07 VITALS — BP 104/76 | HR 126 | Temp 99.6°F | Resp 16 | Wt 110.3 lb

## 2019-02-07 DIAGNOSIS — C3411 Malignant neoplasm of upper lobe, right bronchus or lung: Secondary | ICD-10-CM | POA: Insufficient documentation

## 2019-02-07 DIAGNOSIS — I871 Compression of vein: Secondary | ICD-10-CM | POA: Insufficient documentation

## 2019-02-07 DIAGNOSIS — R05 Cough: Secondary | ICD-10-CM | POA: Diagnosis not present

## 2019-02-07 DIAGNOSIS — Z923 Personal history of irradiation: Secondary | ICD-10-CM | POA: Insufficient documentation

## 2019-02-07 DIAGNOSIS — C3490 Malignant neoplasm of unspecified part of unspecified bronchus or lung: Secondary | ICD-10-CM

## 2019-02-07 DIAGNOSIS — Z9221 Personal history of antineoplastic chemotherapy: Secondary | ICD-10-CM | POA: Diagnosis not present

## 2019-02-07 DIAGNOSIS — C7951 Secondary malignant neoplasm of bone: Secondary | ICD-10-CM | POA: Diagnosis present

## 2019-02-07 NOTE — Progress Notes (Signed)
Radiation Oncology Follow up Note old patient new area left lung cancer  Name: Alexander [name]   Date:   02/07/2019 MRN:  [id number] DOB: 1965-06-07    This 53 y.o. 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 provider found  HPI: Patient is a 53 year old male well-known to our department having originally been treated with concurrent chemoradiation therapy for superior vena cava syndrome from adenocarcinoma of the lung he also has been treated to his right acetabulum for bone metastasis.Marland Kitchen  His chemotherapy with cis-platinum Alimta.  He had palliative radiation therapy in July which is helped his hip pain tremendously.  He is also status post 4 cycles of carbotaxol Avastin and is currently on maintenance Avastin.  He is seen today for progressive disease in his left lower lobe.  He has a mild cough states it is foul-smelling and no blood.  Again he has had good palliative response from his radiation therapy to his right acetabulum.  COMPLICATIONS OF TREATMENT: none  FOLLOW UP COMPLIANCE: keeps appointments   PHYSICAL EXAM:  BP 104/76 (BP Location: Left Arm, Patient Position: Sitting)   Pulse (!) 126   Temp 99.6 F (37.6 C) (Tympanic)   Resp 16   Wt 110 lb 4.8 oz (50 kg)   BMI 16.29 kg/m  Thin cachectic male in NAD.  Well-developed well-nourished patient in NAD. HEENT reveals PERLA, EOMI, discs not visualized.  Oral cavity is clear. No oral mucosal lesions are identified. Neck is clear without evidence of cervical or supraclavicular adenopathy. Lungs are clear to A&P. Cardiac examination is essentially unremarkable with regular rate and rhythm without murmur rub or thrill. Abdomen is benign with no organomegaly or masses noted. Motor sensory and DTR levels are equal and symmetric in the upper and  lower extremities. Cranial nerves II through XII are grossly intact. Proprioception is intact. No peripheral adenopathy or edema is identified. No motor or sensory levels are noted. Crude visual fields are within normal range.  RADIOLOGY RESULTS: CT scans reviewed compatible with above-stated findings  PLAN: At this time like to go with a short hypofractionated course of radiation therapy masses rather large for SBRT treatment at his as it is progressing towards the hilum.  I would plan delivering 6000 cGy in 15 fractions using 3-dimensional treatment planning.  Risks and benefits of treatment including production of cough fatigue skin reaction alteration of blood counts all were discussed in detail with the patient.  I have personally set him up and ordered CT simulation for tomorrow.  There will be extra effort by both professional staff as well as technical staff to coordinate and manage concurrent chemoradiation and ensuing side effects during his treatments. Patient comprehends his treatment plan well.  I would like to take this opportunity to thank you for allowing me to participate in the care of your patient.Noreene Filbert, MD

## 2019-02-07 NOTE — Progress Notes (Signed)
Hematology/Oncology Consult note [facility]  [phone] [phone]  Patient Care Team: Patient, No Pcp Per as PCP - General ([place]) Telford Nab, RN as Registered Nurse [place], Kirt Boys, NP as Nurse Practitioner [facility] and Palliative Medicine)   Name of the patient: Alexander [name]  [id number]  10/12/1965   Date of visit: 02/07/19  Diagnosis- lung adenocarcinoma Stage IIIB cT3 cN2 cM0now with right acetabular metastases and progression of disease in the left lung   Chief complaint/ Reason for visit-on treatment assessment prior to cycle 1 of maintenance Avastin  Heme/Onc history: patient is a 53 year old male with a long-standing history of smoking. He has smoked 1 to 1/2 pack of cigarettes per day for over 30 years. He presented to outside urgent care with some symptoms of shortness of breath and was treated for URI. He subsequently presented with facial and neck swelling which was thought by outside ER to be secondary to a drug reaction. Following that patient came to ER here at [facility].  He underwent CT chest abdomen and pelvis which showed 2.1 x 2 cm irregular mass in the right upper lobe concerning for malignancy.. Multiple collateral veins in chest and abdomen probable thrombosis of the left brachiocephalic vein. This is consistent with IVC syndrome Probable right paratracheal right hilar and precarinal adenopathy resulting in severe narrowing and thrombosis of SVC. This is consistent with SVC syndrome  Patient was seen by vascular surgery and there was no acute need for any endovascular therapies. He is on anticoagulation with Coumadin which has been subsequently changed to Eliquis as an outpatient. Patient initially underwent a bronchoscopy guided biopsy of the mediastinal lymph nodes which was inconclusive and underwent a second IR guided biopsy of the right upper lobe lung mass which was consistent with  adenocarcinoma  Patient started radiation treatment on 03/15/2018. Plan is to give 4 cycles of cisplatin and Alimta followed by maintenance durvalumab. Cycle 1 of cisplatin Alimta given on 03/16/2018.Patient completed 4 cycles of cisplatin and Alimta along with concurrent radiation on 05/20/2018. Scan showed partial response. Cycle 1 of durvalumab given on 06/17/2018. Patient presented to the ER with hypotension tachycardia and was found to have pericardial effusion 10 days after first dose of durvalumab. Fluid negative for malignancy  Patient was empirically treated for pericardial effusion probably secondary to immunotherapy versus radiation and completed 1 month of steroids. Repeat echocardiogram was normal. PDL 1 testing on his initial tumor specimen showed expression of less than 1%.  Scans in May 2020 showed progression of disease in the left lung as well as new right acetabular metastases. Patient received palliative radiation for his bone mets.Carboplatin, Taxol and Avastin chemotherapy started on 11/02/2018.  Scans after 4 cycles showed no evidence of new distant metastases.  Left lower lobe mass appears larger.  Right upper lobe mass was stable to smaller.  Plan is to proceed with maintenance Avastin and radiation to the left lower lobe mass   Interval history-he continues to feel fatigued and oral intake continues to be poor.  Weight has been stable over the last 2 weeks of 107 pounds.  Patient feels that her stay sensation is slowly coming back.  Pain in his right hip is well controlled  ECOG PS- 2 Pain scale- 4 Opioid associated constipation- no  Review of systems- Review of Systems  Constitutional: Positive for malaise/fatigue and weight loss. Negative for chills and fever.  HENT: Negative for congestion, ear discharge and nosebleeds.   Eyes: Negative  for blurred vision.  Respiratory: Negative for cough, hemoptysis, sputum production, shortness of breath and wheezing.     Cardiovascular: Negative for chest pain, palpitations, orthopnea and claudication.  Gastrointestinal: Negative for abdominal pain, blood in stool, constipation, diarrhea, heartburn, melena, nausea and vomiting.  Genitourinary: Negative for dysuria, flank pain, frequency, hematuria and urgency.  Musculoskeletal: Negative for back pain, joint pain and myalgias.  Skin: Negative for rash.  Neurological: Negative for dizziness, tingling, focal weakness, seizures, weakness and headaches.  Endo/Heme/Allergies: Does not bruise/bleed easily.  Psychiatric/Behavioral: Negative for depression and suicidal ideas. The patient does not have insomnia.       Allergies  Allergen Reactions   Doxycycline Swelling   Penicillins Swelling    Did it involve swelling of the face/tongue/throat, SOB, or low BP? Unknown Did it involve sudden or severe rash/hives, skin peeling, or any reaction on the inside of your mouth or nose? Unknown Did you need to seek medical attention at a hospital or doctor's office? Unknown When did it last happen?Childhood If all above answers are NO, may proceed with cephalosporin use.     Past Medical History:  Diagnosis Date   Cancer ([place])    COPD (chronic obstructive pulmonary disease) ([place])    Lung mass      Past Surgical History:  Procedure Laterality Date   ELECTROMAGNETIC NAVIGATION BROCHOSCOPY Left 10/13/2018   Procedure: ELECTROMAGNETIC NAVIGATION BRONCHOSCOPY - LEFT;  Surgeon: Tyler Pita, MD;  Location: [facility] ORS;  Service: Cardiopulmonary;  Laterality: Left;   ENDOBRONCHIAL ULTRASOUND N/A 03/11/2018   Procedure: ENDOBRONCHIAL ULTRASOUND;  Surgeon: Tyler Pita, MD;  Location: [facility] OR[facility];  Service: Cardiopulmonary;  Laterality: N/A;   ENDOBRONCHIAL ULTRASOUND Left 10/13/2018   Procedure: ENDOBRONCHIAL ULTRASOUND - LEFT;  Surgeon: Tyler Pita, MD;  Location: [facility] OR[facility];  Service: Cardiopulmonary;  Laterality: Left;   none     PORTA  CATH INSERTION N/A 03/12/2018   Procedure: PORTA CATH INSERTION, thigh;  Surgeon: Katha Cabal, MD;  Location: [facility];  Service: Cardiovascular;  Laterality: N/A;   SUBXYPHOID PERICARDIAL WINDOW N/A 06/30/2018   Procedure: SUBXYPHOID PERICARDIAL WINDOW;  Surgeon: [name], MD;  Location: [place];  Service: Open Heart Surgery;  Laterality: N/A;   TEE WITHOUT CARDIOVERSION N/A 06/30/2018   Procedure: TRANSESOPHAGEAL ECHOCARDIOGRAM (TEE);  Surgeon: [name], MD;  Location: [place];  Service: Open Heart Surgery;  Laterality: N/A;    Social History   Socioeconomic History   Marital status: Divorced    Spouse name: Not on file   Number of children: Not on file   Years of education: Not on file   Highest education level: Not on file  Occupational History   Occupation: Cabin crew  Social Needs   Financial resource strain: Not on file   Food insecurity    Worry: Not on file    Inability: Not on file   Transportation needs    Medical: Not on file    Non-medical: Not on file  Tobacco Use   Smoking status: Current Every Day Smoker    Packs/day: 0.25   Smokeless tobacco: Never Used   Tobacco comment: 4-5 cig a day  Substance and Sexual Activity   Alcohol use: Not Currently   Drug use: Not Currently    Types: Marijuana   Sexual activity: Not Currently  Lifestyle   Physical activity    Days per week: Not on file    Minutes per session: Not on file   Stress:  Not on file  Relationships   Social connections    Talks on phone: Not on file    Gets together: Not on file    Attends religious service: Not on file    Active member of club or organization: Not on file    Attends meetings of clubs or organizations: Not on file    Relationship status: Not on file   Intimate partner violence    Fear of current or ex partner: Not on file    Emotionally abused: Not on file    Physically abused: Not on file    Forced sexual activity: Not on  file  Other Topics Concern   Not on file  Social History Narrative   Not on file    No family history on file.   Current Outpatient Medications:    ELIQUIS 5 MG TABS tablet, TAKE 1 TABLET BY MOUTH TWICE DAILY, Disp: 180 tablet, Rfl: 3   guaiFENesin (MUCINEX) 600 MG 12 hr tablet, Take 600 mg by mouth 2 (two) times daily as needed., Disp: , Rfl:    Ipratropium-Albuterol (COMBIVENT RESPIMAT) 20-100 MCG/ACT AERS respimat, Inhale 1 puff into the lungs every 6 (six) hours as needed for wheezing or shortness of breath., Disp: 4 g, Rfl: 2   levofloxacin (LEVAQUIN) 500 MG tablet, Take 1 tablet (500 mg total) by mouth daily., Disp: 10 tablet, Rfl: 0   lidocaine-prilocaine (EMLA) cream, Apply to affected area once, Disp: 30 g, Rfl: 3   OLANZapine (ZYPREXA) 10 MG tablet, Take 1 tablet (10 mg total) by mouth at bedtime., Disp: 30 tablet, Rfl: 0   ondansetron (ZOFRAN) 8 MG tablet, Take 1 tablet (8 mg total) by mouth 2 (two) times daily as needed for refractory nausea / vomiting. Start on day 3 after carboplatin chemo., Disp: 30 tablet, Rfl: 1   Oxycodone HCl 10 MG TABS, Take 1 tablet (10 mg total) by mouth every 4 (four) hours as needed., Disp: 120 tablet, Rfl: 0   polyethylene glycol (MIRALAX / GLYCOLAX) 17 g packet, Take 17 g by mouth 2 (two) times a week., Disp: , Rfl:    prochlorperazine (COMPAZINE) 10 MG tablet, Take 1 tablet (10 mg total) by mouth every 6 (six) hours as needed (Nausea or vomiting)., Disp: 30 tablet, Rfl: 1   megestrol (MEGACE) 400 MG/10ML suspension, Take 10 mLs (400 mg total) by mouth 2 (two) times daily. (Patient not taking: Reported on 02/03/2019), Disp: 240 mL, Rfl: 0 No current facility-administered medications for this visit.   Facility-Administered Medications Ordered in Other Visits:    sodium chloride flush (NS) 0.9 % injection 10 mL, 10 mL, Intravenous, Once, Sindy Guadeloupe, MD  Physical exam:  Vitals:   02/04/19 0945  BP: 110/81  Pulse: (!) 125  Resp:  16  Temp: 98.7 F (37.1 C)  TempSrc: Tympanic  Weight: 107 lb 8 oz (48.8 kg)   Physical Exam Constitutional:      Comments: Patient is thin and cachectic.  Appears in no acute distress  HENT:     Head: Normocephalic and atraumatic.  Eyes:     Pupils: Pupils are equal, round, and reactive to light.  Neck:     Musculoskeletal: Normal range of motion.  Cardiovascular:     Rate and Rhythm: Regular rhythm. Tachycardia present.     Heart sounds: Normal heart sounds.  Pulmonary:     Effort: Pulmonary effort is normal.     Breath sounds: Normal breath sounds.  Abdominal:  General: Bowel sounds are normal.     Palpations: Abdomen is soft.  Skin:    General: Skin is warm and dry.  Neurological:     Mental Status: He is alert and oriented to person, place, and time.      CMP Latest Ref Rng & Units 02/04/2019  Glucose 70 - 99 mg/dL 111(H)  BUN 6 - 20 mg/dL 15  Creatinine 0.61 - 1.24 mg/dL 1.06  Sodium 135 - 145 mmol/L 135  Potassium 3.5 - 5.1 mmol/L 3.7  Chloride 98 - 111 mmol/L 99  CO2 22 - 32 mmol/L 27  Calcium 8.9 - 10.3 mg/dL 7.7(L)  Total Protein 6.5 - 8.1 g/dL 7.1  Total Bilirubin 0.3 - 1.2 mg/dL 0.5  Alkaline Phos 38 - 126 U/L 113  AST 15 - 41 U/L 22  ALT 0 - 44 U/L 11   CBC Latest Ref Rng & Units 02/04/2019  WBC 4.0 - 10.5 K/uL 15.6(H)  Hemoglobin 13.0 - 17.0 g/dL 7.8(L)  Hematocrit 39.0 - 52.0 % 24.9(L)  Platelets 150 - 400 K/uL 115(L)    No images are attached to the encounter.  Ct Chest W Contrast  Result Date: 01/11/2019 CLINICAL DATA:  Lung cancer.  Restaging. EXAM: CT CHEST, ABDOMEN, AND PELVIS WITH CONTRAST TECHNIQUE: Multidetector CT imaging of the chest, abdomen and pelvis was performed following the standard protocol during bolus administration of intravenous contrast. CONTRAST:  33mL OMNIPAQUE IOHEXOL 300 MG/ML  SOLN COMPARISON:  PET-CT 09/28/2018.  Chest CT 10/11/2018 FINDINGS: CT CHEST FINDINGS Cardiovascular: The heart size is normal. No substantial  pericardial effusion. No thoracic aortic aneurysm. Mediastinum/Nodes: Ill-defined right paratracheal soft tissue measured previously at 2.1 x 1.7 cm measures 2.0 x 1.5 cm today. Abnormal soft tissue in the right hilum is 1.3 cm short axis (30/2), stable in the interval. Mild circumferential wall thickening noted mid and distal esophagus, as before. There is no axillary lymphadenopathy. Lungs/Pleura: Centrilobular emphsyema noted. 1.1 x 1.0 cm nodule medial right upper lobe is similar to prior. Bronchial wall thickening with tree-in-bud opacity in the right upper lobe, right middle lobe, and right lower lobe has progressed in the interval. Some of the nodularity in the right upper lobe has become confluent, measuring 1.3 x 0.6 cm on 80/3. The 2.9 x 2.6 cm central left lower lobe lesion has evolved in the interval, now extending in the left lower lobe to the lateral pleura. Lesion today measures 4.9 x 3.4 cm in shows central cavitation with adjacent pleural thickening. Musculoskeletal: No worrisome lytic or sclerotic osseous abnormality. CT ABDOMEN PELVIS FINDINGS Hepatobiliary: No suspicious focal abnormality within the liver parenchyma. There is no evidence for gallstones, gallbladder wall thickening, or pericholecystic fluid. No intrahepatic or extrahepatic biliary dilation. Pancreas: No focal mass lesion. No dilatation of the main duct. No intraparenchymal cyst. No peripancreatic edema. Spleen: 1.5 cm fluid density lesion is identified between the proximal stomach and the spleen (67/2). This appears new in the interval. Adrenals/Urinary Tract: Adrenal thickening without nodularity evident. Kidneys unremarkable. No evidence for hydroureter. The urinary bladder appears normal for the degree of distention. Stomach/Bowel: Stomach is unremarkable. No gastric wall thickening. No evidence of outlet obstruction. Duodenum is normally positioned as is the ligament of Treitz. No small bowel wall thickening. No small bowel  dilatation. No gross colonic mass. No colonic wall thickening. Vascular/Lymphatic: There is abdominal aortic atherosclerosis without aneurysm. There is no gastrohepatic or hepatoduodenal ligament lymphadenopathy. No intraperitoneal or retroperitoneal lymphadenopathy. No pelvic sidewall lymphadenopathy. Right common femoral venous  catheter tip is positioned in the infra hepatic IVC. Reproductive: Unremarkable. Other: No intraperitoneal free fluid. Musculoskeletal: Similar appearance of the small lytic lesion in the right acetabulum (111/2). Sclerotic focus in the left sacrum is stable. IMPRESSION: 1. Interval evolution of the left lower lobe lesion, now larger and extending to the lateral pleura. The mass demonstrates central cavitation and measures 4.9 x 3.4 cm. Disease progression cannot be excluded. 2. Interval development of diffuse tree-in-bud nodularity in all 3 lobes of the right lung, but most pronounced in the right upper lobe. Features probably reflect atypical infection with some confluent nodular areas measuring up to 1.3 cm. Close follow-up recommended. 3. Posterior tree-in-bud opacity in the superior segment left lower lobe also likely atypical infection although endobronchial spread of disease cannot be excluded. 4. Similar appearance of ill-defined abnormal soft tissue in the right parahilar region and right hilum. 5. Stable lytic metastatic lesion right acetabulum. Electronically Signed   By: Misty Stanley M.D.   On: 01/11/2019 09:51   Nm Bone Scan Whole Body  Result Date: 01/13/2019 CLINICAL DATA:  Stage IV lung cancer, bone metastases, on chemotherapy, question response to treatment EXAM: NUCLEAR MEDICINE WHOLE BODY BONE SCAN TECHNIQUE: Whole body anterior and posterior images were obtained approximately 3 hours after intravenous injection of radiopharmaceutical. RADIOPHARMACEUTICALS:  21.20 mCi Technetium-94m MDP IV COMPARISON:  None Correlation: CT chest abdomen pelvis 01/09/2019, PET-CT  09/28/2018 FINDINGS: Normal ectatic of tracer within the axial and appendicular skeleton. No scintigraphic evidence of osseous metastatic disease. Specifically, no abnormal uptake of tracer is seen at the site of abnormal FDG accumulation at the RIGHT acetabulum identified on the prior PET-CT. Expected urinary tract and soft tissue distribution of tracer. IMPRESSION: No definite scintigraphic evidence of osseous metastatic disease. Electronically Signed   By: Lavonia Dana M.D.   On: 01/13/2019 16:36   Ct Abdomen Pelvis W Contrast  Result Date: 01/11/2019 CLINICAL DATA:  Lung cancer.  Restaging. EXAM: CT CHEST, ABDOMEN, AND PELVIS WITH CONTRAST TECHNIQUE: Multidetector CT imaging of the chest, abdomen and pelvis was performed following the standard protocol during bolus administration of intravenous contrast. CONTRAST:  8mL OMNIPAQUE IOHEXOL 300 MG/ML  SOLN COMPARISON:  PET-CT 09/28/2018.  Chest CT 10/11/2018 FINDINGS: CT CHEST FINDINGS Cardiovascular: The heart size is normal. No substantial pericardial effusion. No thoracic aortic aneurysm. Mediastinum/Nodes: Ill-defined right paratracheal soft tissue measured previously at 2.1 x 1.7 cm measures 2.0 x 1.5 cm today. Abnormal soft tissue in the right hilum is 1.3 cm short axis (30/2), stable in the interval. Mild circumferential wall thickening noted mid and distal esophagus, as before. There is no axillary lymphadenopathy. Lungs/Pleura: Centrilobular emphsyema noted. 1.1 x 1.0 cm nodule medial right upper lobe is similar to prior. Bronchial wall thickening with tree-in-bud opacity in the right upper lobe, right middle lobe, and right lower lobe has progressed in the interval. Some of the nodularity in the right upper lobe has become confluent, measuring 1.3 x 0.6 cm on 80/3. The 2.9 x 2.6 cm central left lower lobe lesion has evolved in the interval, now extending in the left lower lobe to the lateral pleura. Lesion today measures 4.9 x 3.4 cm in shows central  cavitation with adjacent pleural thickening. Musculoskeletal: No worrisome lytic or sclerotic osseous abnormality. CT ABDOMEN PELVIS FINDINGS Hepatobiliary: No suspicious focal abnormality within the liver parenchyma. There is no evidence for gallstones, gallbladder wall thickening, or pericholecystic fluid. No intrahepatic or extrahepatic biliary dilation. Pancreas: No focal mass lesion. No dilatation of the main  duct. No intraparenchymal cyst. No peripancreatic edema. Spleen: 1.5 cm fluid density lesion is identified between the proximal stomach and the spleen (67/2). This appears new in the interval. Adrenals/Urinary Tract: Adrenal thickening without nodularity evident. Kidneys unremarkable. No evidence for hydroureter. The urinary bladder appears normal for the degree of distention. Stomach/Bowel: Stomach is unremarkable. No gastric wall thickening. No evidence of outlet obstruction. Duodenum is normally positioned as is the ligament of Treitz. No small bowel wall thickening. No small bowel dilatation. No gross colonic mass. No colonic wall thickening. Vascular/Lymphatic: There is abdominal aortic atherosclerosis without aneurysm. There is no gastrohepatic or hepatoduodenal ligament lymphadenopathy. No intraperitoneal or retroperitoneal lymphadenopathy. No pelvic sidewall lymphadenopathy. Right common femoral venous catheter tip is positioned in the infra hepatic IVC. Reproductive: Unremarkable. Other: No intraperitoneal free fluid. Musculoskeletal: Similar appearance of the small lytic lesion in the right acetabulum (111/2). Sclerotic focus in the left sacrum is stable. IMPRESSION: 1. Interval evolution of the left lower lobe lesion, now larger and extending to the lateral pleura. The mass demonstrates central cavitation and measures 4.9 x 3.4 cm. Disease progression cannot be excluded. 2. Interval development of diffuse tree-in-bud nodularity in all 3 lobes of the right lung, but most pronounced in the right  upper lobe. Features probably reflect atypical infection with some confluent nodular areas measuring up to 1.3 cm. Close follow-up recommended. 3. Posterior tree-in-bud opacity in the superior segment left lower lobe also likely atypical infection although endobronchial spread of disease cannot be excluded. 4. Similar appearance of ill-defined abnormal soft tissue in the right parahilar region and right hilum. 5. Stable lytic metastatic lesion right acetabulum. Electronically Signed   By: Misty Stanley M.D.   On: 01/11/2019 09:51     Assessment and plan- Patient is a 53 y.o. male of stage IIIb likely lung adenocarcinoma T3 N2 M0 presenting as SVC syndrome.He is status post concurrent chemoradiation with cisplatin and Alimta. He developed pericardial effusion after 1 dose of durvalumab and was not rechallenged. Scans in May 2020 showed progression of disease in the left lung as well as right acetabular metastases. He is s/p palliative radiation to his right hip.  He is status post 4 cycles of carbotaxol and Avastin here for on treatment assessment prior to cycle 1 of maintenance Avastin  Platelet count is improved from 65 -115.  He still continues to remain anemic with a hemoglobin of 7.8 today.  He will likely take several weeks to improve his anemia given myelosuppression from chemotherapy.  Counts are okay to proceed with cycle 1 of maintenance Avastin today.  I will see him back in 3 weeks time with CBC with differential, CMP and urine protein for cycle 2 of Avastin.  He is also seeing radiation oncology soon and will be getting SBRT to his left lower lobe mass.  Overall patient's prognosis is poor and is declining performance status makes it challenging to offer intensive chemotherapy in the future.  Plan is to continue a Avastin for now which will be relatively better tolerated and not myelosuppressive.  Patient will receive Delton See today for his bone metastases and 1 L of IV fluids   Visit  Diagnosis 1. Anemia due to antineoplastic chemotherapy   2. Encounter for monoclonal antibody treatment for malignancy   3. Severe protein-calorie malnutrition ([place])   4. Adenocarcinoma, lung, right ([place])   5. Bone metastases ([place])   6. Long term (current) use of bisphosphonates      Dr. Randa Evens, MD, MPH [place] at  [facility] [phone] 02/07/2019 8:11 AM

## 2019-02-08 ENCOUNTER — Ambulatory Visit
Admission: RE | Admit: 2019-02-08 | Discharge: 2019-02-08 | Disposition: A | Discharge status: Home / Self Care | Payer: Medicaid Other | Source: Ambulatory Visit | Attending: Radiation Oncology | Admitting: Radiation Oncology

## 2019-02-08 ENCOUNTER — Other Ambulatory Visit: Payer: Self-pay

## 2019-02-08 DIAGNOSIS — F17210 Nicotine dependence, cigarettes, uncomplicated: Secondary | ICD-10-CM | POA: Insufficient documentation

## 2019-02-08 DIAGNOSIS — C3411 Malignant neoplasm of upper lobe, right bronchus or lung: Secondary | ICD-10-CM | POA: Insufficient documentation

## 2019-02-08 DIAGNOSIS — C7802 Secondary malignant neoplasm of left lung: Secondary | ICD-10-CM | POA: Insufficient documentation

## 2019-02-08 DIAGNOSIS — Z510 Encounter for antineoplastic radiation therapy: Secondary | ICD-10-CM | POA: Diagnosis present

## 2019-02-09 DIAGNOSIS — Z510 Encounter for antineoplastic radiation therapy: Secondary | ICD-10-CM | POA: Diagnosis not present

## 2019-02-11 ENCOUNTER — Other Ambulatory Visit: Payer: Self-pay

## 2019-02-11 ENCOUNTER — Encounter: Payer: Self-pay | Admitting: Emergency Medicine

## 2019-02-11 ENCOUNTER — Telehealth: Payer: Self-pay | Admitting: *Deleted

## 2019-02-11 ENCOUNTER — Inpatient Hospital Stay
Admission: EM | Admit: 2019-02-11 | Discharge: 2019-02-13 | DRG: 871 | Disposition: A | Discharge status: Home / Self Care | Payer: Medicaid Other | Attending: Internal Medicine | Admitting: Internal Medicine

## 2019-02-11 ENCOUNTER — Emergency Department: Payer: Medicaid Other

## 2019-02-11 DIAGNOSIS — E860 Dehydration: Secondary | ICD-10-CM | POA: Diagnosis present

## 2019-02-11 DIAGNOSIS — Z79891 Long term (current) use of opiate analgesic: Secondary | ICD-10-CM | POA: Diagnosis not present

## 2019-02-11 DIAGNOSIS — Z881 Allergy status to other antibiotic agents status: Secondary | ICD-10-CM

## 2019-02-11 DIAGNOSIS — C3490 Malignant neoplasm of unspecified part of unspecified bronchus or lung: Secondary | ICD-10-CM | POA: Diagnosis present

## 2019-02-11 DIAGNOSIS — J189 Pneumonia, unspecified organism: Secondary | ICD-10-CM | POA: Diagnosis present

## 2019-02-11 DIAGNOSIS — C7951 Secondary malignant neoplasm of bone: Secondary | ICD-10-CM | POA: Diagnosis present

## 2019-02-11 DIAGNOSIS — Z7951 Long term (current) use of inhaled steroids: Secondary | ICD-10-CM | POA: Diagnosis not present

## 2019-02-11 DIAGNOSIS — I959 Hypotension, unspecified: Secondary | ICD-10-CM | POA: Diagnosis present

## 2019-02-11 DIAGNOSIS — Z66 Do not resuscitate: Secondary | ICD-10-CM | POA: Diagnosis present

## 2019-02-11 DIAGNOSIS — F17210 Nicotine dependence, cigarettes, uncomplicated: Secondary | ICD-10-CM | POA: Diagnosis present

## 2019-02-11 DIAGNOSIS — D6481 Anemia due to antineoplastic chemotherapy: Secondary | ICD-10-CM | POA: Diagnosis present

## 2019-02-11 DIAGNOSIS — A419 Sepsis, unspecified organism: Secondary | ICD-10-CM | POA: Diagnosis present

## 2019-02-11 DIAGNOSIS — Z880 Allergy status to penicillin: Secondary | ICD-10-CM

## 2019-02-11 DIAGNOSIS — Y95 Nosocomial condition: Secondary | ICD-10-CM | POA: Diagnosis present

## 2019-02-11 DIAGNOSIS — T451X5A Adverse effect of antineoplastic and immunosuppressive drugs, initial encounter: Secondary | ICD-10-CM | POA: Diagnosis present

## 2019-02-11 DIAGNOSIS — Z792 Long term (current) use of antibiotics: Secondary | ICD-10-CM | POA: Diagnosis not present

## 2019-02-11 DIAGNOSIS — Z20828 Contact with and (suspected) exposure to other viral communicable diseases: Secondary | ICD-10-CM | POA: Diagnosis present

## 2019-02-11 DIAGNOSIS — E872 Acidosis, unspecified: Secondary | ICD-10-CM

## 2019-02-11 DIAGNOSIS — J440 Chronic obstructive pulmonary disease with acute lower respiratory infection: Secondary | ICD-10-CM | POA: Diagnosis present

## 2019-02-11 DIAGNOSIS — C3432 Malignant neoplasm of lower lobe, left bronchus or lung: Secondary | ICD-10-CM

## 2019-02-11 DIAGNOSIS — A4189 Other specified sepsis: Secondary | ICD-10-CM

## 2019-02-11 DIAGNOSIS — Z7901 Long term (current) use of anticoagulants: Secondary | ICD-10-CM

## 2019-02-11 DIAGNOSIS — Z79899 Other long term (current) drug therapy: Secondary | ICD-10-CM | POA: Diagnosis not present

## 2019-02-11 DIAGNOSIS — R740 Nonspecific elevation of levels of transaminase and lactic acid dehydrogenase [LDH]: Secondary | ICD-10-CM

## 2019-02-11 DIAGNOSIS — J9600 Acute respiratory failure, unspecified whether with hypoxia or hypercapnia: Secondary | ICD-10-CM | POA: Diagnosis present

## 2019-02-11 LAB — COMPREHENSIVE METABOLIC PANEL
ALT: 8 U/L (ref 0–44)
AST: 24 U/L (ref 15–41)
Albumin: 2.5 g/dL — ABNORMAL LOW (ref 3.5–5.0)
Alkaline Phosphatase: 168 U/L — ABNORMAL HIGH (ref 38–126)
Anion gap: 13 (ref 5–15)
BUN: 13 mg/dL (ref 6–20)
CO2: 26 mmol/L (ref 22–32)
Calcium: 7.2 mg/dL — ABNORMAL LOW (ref 8.9–10.3)
Chloride: 92 mmol/L — ABNORMAL LOW (ref 98–111)
Creatinine, Ser: 0.88 mg/dL (ref 0.61–1.24)
GFR calc Af Amer: >60 mL/min (ref >60)
GFR calc non Af Amer: >60 mL/min (ref >60)
Glucose, Bld: 104 mg/dL — ABNORMAL HIGH (ref 70–99)
Potassium: 4.3 mmol/L (ref 3.5–5.1)
Sodium: 131 mmol/L — ABNORMAL LOW (ref 135–145)
Total Bilirubin: 1.1 mg/dL (ref 0.3–1.2)
Total Protein: 7.7 g/dL (ref 6.5–8.1)

## 2019-02-11 LAB — CBC WITH DIFFERENTIAL/PLATELET
Abs Immature Granulocytes: 0.14 10*3/uL — ABNORMAL HIGH (ref 0.00–0.07)
Basophils Absolute: 0 10*3/uL (ref 0.0–0.1)
Basophils Relative: 0 %
Eosinophils Absolute: 0 10*3/uL (ref 0.0–0.5)
Eosinophils Relative: 0 %
HCT: 27 % — ABNORMAL LOW (ref 39.0–52.0)
Hemoglobin: 8.4 g/dL — ABNORMAL LOW (ref 13.0–17.0)
Immature Granulocytes: 1 %
Lymphocytes Relative: 3 %
Lymphs Abs: 0.7 10*3/uL (ref 0.7–4.0)
MCH: 29.3 pg (ref 26.0–34.0)
MCHC: 31.1 g/dL (ref 30.0–36.0)
MCV: 94.1 fL (ref 80.0–100.0)
Monocytes Absolute: 0.8 10*3/uL (ref 0.1–1.0)
Monocytes Relative: 3 %
Neutro Abs: 21 10*3/uL — ABNORMAL HIGH (ref 1.7–7.7)
Neutrophils Relative %: 93 %
Platelets: 150 10*3/uL (ref 150–400)
RBC: 2.87 MIL/uL — ABNORMAL LOW (ref 4.22–5.81)
RDW: 19 % — ABNORMAL HIGH (ref 11.5–15.5)
WBC: 22.6 10*3/uL — ABNORMAL HIGH (ref 4.0–10.5)
nRBC: 0 % (ref 0.0–0.2)

## 2019-02-11 LAB — LACTIC ACID, PLASMA
Lactic Acid, Venous: 2.4 mmol/L (ref 0.5–1.9)
Lactic Acid, Venous: 2.6 mmol/L (ref 0.5–1.9)
Lactic Acid, Venous: 1.2 mmol/L (ref 0.5–1.9)

## 2019-02-11 LAB — SARS CORONAVIRUS 2 (TAT 6-24 HRS): SARS Coronavirus 2: NEGATIVE

## 2019-02-11 LAB — MRSA PCR SCREENING: MRSA by PCR: NEGATIVE

## 2019-02-11 MED ORDER — IPRATROPIUM-ALBUTEROL 0.5-2.5 (3) MG/3ML IN SOLN: 3.0000 mL | Freq: Four times a day (QID) | RESPIRATORY_TRACT | Status: DC | PRN

## 2019-02-11 MED ORDER — ACETAMINOPHEN 325 MG PO TABS: 650.0000 mg | ORAL_TABLET | Freq: Four times a day (QID) | ORAL | Status: DC | PRN

## 2019-02-11 MED ORDER — DOCUSATE SODIUM 100 MG PO CAPS: 100.0000 mg | ORAL_CAPSULE | Freq: Two times a day (BID) | ORAL | Status: DC | PRN

## 2019-02-11 MED ORDER — VANCOMYCIN HCL IN DEXTROSE 1-5 GM/200ML-% IV SOLN
1000.0000 mg | Freq: Once | INTRAVENOUS | Status: AC
  Administered 2019-02-11: 1000 mg via INTRAVENOUS
  Filled 2019-02-11: qty 200

## 2019-02-11 MED ORDER — OLANZAPINE 10 MG PO TABS
10.0000 mg | ORAL_TABLET | Freq: Every evening | ORAL | Status: DC | PRN
  Filled 2019-02-11: qty 1

## 2019-02-11 MED ORDER — MEGESTROL ACETATE 400 MG/10ML PO SUSP
400.0000 mg | Freq: Two times a day (BID) | ORAL | Status: DC
  Administered 2019-02-11 – 2019-02-13 (×4): 400 mg via ORAL
  Filled 2019-02-11 (×5): qty 10

## 2019-02-11 MED ORDER — IPRATROPIUM-ALBUTEROL 20-100 MCG/ACT IN AERS
1.0000 | INHALATION_SPRAY | Freq: Four times a day (QID) | RESPIRATORY_TRACT | Status: DC
  Administered 2019-02-11 – 2019-02-13 (×8): 1 via RESPIRATORY_TRACT
  Filled 2019-02-11: qty 4

## 2019-02-11 MED ORDER — DEXAMETHASONE SODIUM PHOSPHATE 10 MG/ML IJ SOLN
10.0000 mg | Freq: Once | INTRAMUSCULAR | Status: AC
  Administered 2019-02-11: 14:00:00 10 mg via INTRAVENOUS
  Filled 2019-02-11: qty 1

## 2019-02-11 MED ORDER — POLYETHYLENE GLYCOL 3350 17 G PO PACK: 17.0000 g | PACK | ORAL | Status: DC

## 2019-02-11 MED ORDER — SODIUM CHLORIDE 0.9 % IV BOLUS
1000.0000 mL | Freq: Once | INTRAVENOUS | Status: AC
  Administered 2019-02-11: 12:00:00 1000 mL via INTRAVENOUS

## 2019-02-11 MED ORDER — VANCOMYCIN HCL 500 MG IV SOLR
500.0000 mg | Freq: Two times a day (BID) | INTRAVENOUS | Status: DC
  Administered 2019-02-12: 500 mg via INTRAVENOUS
  Filled 2019-02-11 (×4): qty 500

## 2019-02-11 MED ORDER — GUAIFENESIN ER 600 MG PO TB12: 600.0000 mg | ORAL_TABLET | Freq: Two times a day (BID) | ORAL | Status: DC | PRN

## 2019-02-11 MED ORDER — SODIUM CHLORIDE 0.9 % IV SOLN
INTRAVENOUS | Status: DC | PRN
  Administered 2019-02-11: 10 mL via INTRAVENOUS
  Administered 2019-02-12: 15:00:00 250 mL via INTRAVENOUS
  Administered 2019-02-12 – 2019-02-13 (×3): 10 mL via INTRAVENOUS

## 2019-02-11 MED ORDER — APIXABAN 5 MG PO TABS
5.0000 mg | ORAL_TABLET | Freq: Two times a day (BID) | ORAL | Status: DC
  Administered 2019-02-11 – 2019-02-12 (×3): 5 mg via ORAL
  Filled 2019-02-11 (×3): qty 1

## 2019-02-11 MED ORDER — SODIUM CHLORIDE 0.9 % IV SOLN
2.0000 g | Freq: Once | INTRAVENOUS | Status: AC
  Administered 2019-02-11: 13:00:00 2 g via INTRAVENOUS
  Filled 2019-02-11: qty 2

## 2019-02-11 MED ORDER — PROCHLORPERAZINE MALEATE 10 MG PO TABS
10.0000 mg | ORAL_TABLET | Freq: Four times a day (QID) | ORAL | Status: DC | PRN
  Filled 2019-02-11: qty 1

## 2019-02-11 MED ORDER — IPRATROPIUM-ALBUTEROL 0.5-2.5 (3) MG/3ML IN SOLN: 3.0000 mL | Freq: Once | RESPIRATORY_TRACT | Status: DC

## 2019-02-11 MED ORDER — SODIUM CHLORIDE 0.9 % IV SOLN
2.0000 g | Freq: Three times a day (TID) | INTRAVENOUS | Status: DC
  Administered 2019-02-11 – 2019-02-13 (×5): 2 g via INTRAVENOUS
  Filled 2019-02-11 (×6): qty 2

## 2019-02-11 MED ORDER — ONDANSETRON HCL 4 MG PO TABS: 8.0000 mg | ORAL_TABLET | Freq: Two times a day (BID) | ORAL | Status: DC | PRN

## 2019-02-11 MED ORDER — IPRATROPIUM-ALBUTEROL 0.5-2.5 (3) MG/3ML IN SOLN: 3.0000 mL | RESPIRATORY_TRACT | Status: DC

## 2019-02-11 MED ORDER — OXYCODONE HCL 5 MG PO TABS
10.0000 mg | ORAL_TABLET | ORAL | Status: DC | PRN
  Administered 2019-02-11 – 2019-02-13 (×5): 10 mg via ORAL
  Filled 2019-02-11 (×4): qty 2

## 2019-02-11 NOTE — Plan of Care (Signed)
  Problem: Education: Goal: Knowledge of General Education information will improve Description: Including pain rating scale, medication(s)/side effects and non-pharmacologic comfort measures Outcome: Progressing   Problem: Health Behavior/Discharge Planning: Goal: Ability to manage health-related needs will improve Outcome: Progressing   Problem: Clinical Measurements: Goal: Ability to maintain clinical measurements within normal limits will improve Outcome: Not Progressing Note: W.B.C.'s are elevated at 22.6. Remains on dual ABX. Will continue to monitor lab values. Alexander [name]

## 2019-02-11 NOTE — Progress Notes (Signed)
PHARMACY -  BRIEF ANTIBIOTIC NOTE   Pharmacy has received consult(s) for Vancomycin and Cefepime from an ED provider.  The patient's profile has been reviewed for ht/wt/allergies/indication/available labs.    One time order(s) placed for Vancomycin 1g IV and Cefepime 2g IV  Further antibiotics/pharmacy consults should be ordered by admitting physician if indicated.                       Thank you, Pearla Dubonnet 02/11/2019  12:38 PM

## 2019-02-11 NOTE — ED Provider Notes (Signed)
[facility] Emergency Department Provider Note  ____________________________________________   First MD Initiated Contact with Patient 02/11/19 1201     (approximate)  I have reviewed the triage vital signs and the nursing notes.   HISTORY  Chief Complaint Hypotension    HPI Alexander [name] is a 53 y.o. male  With h/o COPD, lung CA, here with weakness, lightheadedness. Pt states that for the past 3-4 days, he's had general weakness, lightheadedness, and has been just "tired." He's noticed difficulty getting around the house 2/2 this weakness, as well as getting ot his radiation appt. He states the sx were gradual but acutely worsened this AM and he had difficulty getting up. He also c/o a sharp bilateral chest pain, worse with coughing. He's been coughing more than usual and had increased sputum production. No fevers. He endorses moderately poor appetite. No n/v. No diarrhea. No known sick contacts. No pain at his port site. Last chemo was >1 week ago. Sx improve when lying flat.        Past Medical History:  Diagnosis Date  . Cancer ([place])   . COPD (chronic obstructive pulmonary disease) ([place])   . Lung mass     Patient Active Problem List   Diagnosis Date Noted  . Bone metastases ([place]) 10/21/2018  . Sepsis ([place]) 09/25/2018  . HCAP (healthcare-associated pneumonia) 09/25/2018  . COPD (chronic obstructive pulmonary disease) ([place]) 09/25/2018  . Malnutrition of moderate degree 07/03/2018  . Acute respiratory failure ([place])   . Pericardial effusion with cardiac tamponade 06/30/2018  . Adenocarcinoma, lung, right ([place])   . History of cancer chemotherapy   . History of radiation therapy   . Elevated serum creatinine 06/10/2018  . Goals of care, counseling/discussion 06/03/2018  . Lung cancer ([place]) 03/16/2018  . 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 ELECTROMAGNETIC NAVIGATION BROCHOSCOPY Left 10/13/2018   Procedure: ELECTROMAGNETIC NAVIGATION BRONCHOSCOPY - LEFT;  Surgeon: Tyler Pita, MD;  Location: [facility] OR[facility];  Service: Cardiopulmonary;  Laterality: Left;  . ENDOBRONCHIAL ULTRASOUND N/A 03/11/2018   Procedure: ENDOBRONCHIAL ULTRASOUND;  Surgeon: Tyler Pita, MD;  Location: [facility] OR[facility];  Service: Cardiopulmonary;  Laterality: N/A;  . ENDOBRONCHIAL ULTRASOUND Left 10/13/2018   Procedure: ENDOBRONCHIAL ULTRASOUND - LEFT;  Surgeon: Tyler Pita, MD;  Location: [facility] OR[facility];  Service: Cardiopulmonary;  Laterality: Left;  . none    . PORTA CATH INSERTION N/A 03/12/2018   Procedure: PORTA CATH INSERTION, thigh;  Surgeon: Katha Cabal, MD;  Location: [place] [facility];  Service: Cardiovascular;  Laterality: N/A;  . SUBXYPHOID PERICARDIAL WINDOW N/A 06/30/2018   Procedure: SUBXYPHOID PERICARDIAL WINDOW;  Surgeon: [name], MD;  Location: [place];  Service: Open Heart Surgery;  Laterality: N/A;  . TEE WITHOUT CARDIOVERSION N/A 06/30/2018   Procedure: TRANSESOPHAGEAL ECHOCARDIOGRAM (TEE);  Surgeon: [name], MD;  Location: [place];  Service: Open Heart Surgery;  Laterality: N/A;    Prior to Admission medications   Medication Sig Start Date End Date Taking? Authorizing Provider  ELIQUIS 5 MG TABS tablet TAKE 1 TABLET BY MOUTH TWICE DAILY 01/19/19   Sindy Guadeloupe, MD  guaiFENesin (MUCINEX) 600 MG 12 hr tablet Take 600 mg by mouth 2 (two) times daily as needed.    [provider]  Ipratropium-Albuterol (COMBIVENT RESPIMAT) 20-100 MCG/ACT AERS respimat Inhale 1 puff into the lungs every 6 (six) hours as needed for wheezing or shortness of breath. 01/25/19   Randa Evens  C, MD  levofloxacin (LEVAQUIN) 500 MG tablet Take 1 tablet (500 mg total) by mouth daily. 01/25/19   Sindy Guadeloupe, MD  lidocaine-prilocaine (EMLA) cream Apply to affected area once 11/02/18   Sindy Guadeloupe, MD  megestrol (MEGACE) 400 MG/10ML suspension Take 10 mLs (400 mg total) by mouth 2 (two) times  daily. Patient not taking: Reported on 02/03/2019 11/29/18   Sindy Guadeloupe, MD  OLANZapine (ZYPREXA) 10 MG tablet Take 1 tablet (10 mg total) by mouth at bedtime. 01/04/19   Sindy Guadeloupe, MD  ondansetron (ZOFRAN) 8 MG tablet Take 1 tablet (8 mg total) by mouth 2 (two) times daily as needed for refractory nausea / vomiting. Start on day 3 after carboplatin chemo. 11/02/18   Sindy Guadeloupe, MD  Oxycodone HCl 10 MG TABS Take 1 tablet (10 mg total) by mouth every 4 (four) hours as needed. 01/25/19   Sindy Guadeloupe, MD  polyethylene glycol (MIRALAX / GLYCOLAX) 17 g packet Take 17 g by mouth 2 (two) times a week.    [provider]  prochlorperazine (COMPAZINE) 10 MG tablet Take 1 tablet (10 mg total) by mouth every 6 (six) hours as needed (Nausea or vomiting). 11/02/18   Sindy Guadeloupe, MD  apixaban (ELIQUIS) 5 MG TABS tablet Take 1 tablet (5 mg total) by mouth 2 (two) times daily for 30 days. 10/13/18   Sindy Guadeloupe, MD    Allergies Doxycycline and Penicillins  History reviewed. No pertinent family history.  Social History Social History   Tobacco Use  . Smoking status: Current Every Day Smoker    Packs/day: 0.25  . Smokeless tobacco: Never Used  . Tobacco comment: 4-5 cig a day  Substance Use Topics  . Alcohol use: Not Currently  . Drug use: Not Currently    Types: Marijuana    Review of Systems  Review of Systems  Constitutional: Positive for fatigue. Negative for chills and fever.  HENT: Negative for sore throat.   Respiratory: Positive for cough and shortness of breath.   Cardiovascular: Negative for chest pain.  Gastrointestinal: Negative for abdominal pain.  Genitourinary: Negative for flank pain.  Musculoskeletal: Negative for neck pain.  Skin: Negative for rash and wound.  Allergic/Immunologic: Negative for immunocompromised state.  Neurological: Positive for dizziness, weakness and light-headedness. Negative for numbness.  Hematological: Does not bruise/bleed easily.   All other systems reviewed and are negative.    ____________________________________________  PHYSICAL EXAM:      VITAL SIGNS: ED Triage Vitals [02/11/19 1156]  Enc Vitals Group     BP      Pulse Rate (!) 111     Resp 13     Temp 98.7 F (37.1 C)     Temp Source Oral     SpO2      Weight 110 lb (49.9 kg)     Height 5\' 9"  (1.753 m)     Head Circumference      Peak Flow      Pain Score 0     Pain Loc      Pain Edu?      Excl. in [place]?      Physical Exam Vitals signs and nursing note reviewed.  Constitutional:      General: He is not in acute distress.    Appearance: He is well-developed.  HENT:     Head: Normocephalic and atraumatic.     Mouth/Throat:     Mouth: Mucous membranes are dry.  Eyes:     Conjunctiva/sclera: Conjunctivae normal.  Neck:     Musculoskeletal: Neck supple.  Cardiovascular:     Rate and Rhythm: Regular rhythm. Tachycardia present.     Heart sounds: Normal heart sounds. No murmur. No friction rub.  Pulmonary:     Effort: Pulmonary effort is normal. Tachypnea present. No respiratory distress.     Breath sounds: Examination of the left-middle field reveals rhonchi. Examination of the left-lower field reveals rhonchi. Decreased breath sounds and rhonchi present. No wheezing or rales.  Abdominal:     General: There is no distension.     Palpations: Abdomen is soft.     Tenderness: There is no abdominal tenderness.  Skin:    General: Skin is warm.     Capillary Refill: Capillary refill takes less than 2 seconds.  Neurological:     Mental Status: He is alert and oriented to person, place, and time.     Motor: No abnormal muscle tone.       ____________________________________________   LABS (all labs ordered are listed, but only abnormal results are displayed)  Labs Reviewed  CBC WITH DIFFERENTIAL/PLATELET - Abnormal; Notable for the following components:      Result Value   WBC 22.6 (*)    RBC 2.87 (*)    Hemoglobin 8.4 (*)    HCT  27.0 (*)    RDW 19.0 (*)    Neutro Abs 21.0 (*)    Abs Immature Granulocytes 0.14 (*)    All other components within normal limits  COMPREHENSIVE METABOLIC PANEL - Abnormal; Notable for the following components:   Sodium 131 (*)    Chloride 92 (*)    Glucose, Bld 104 (*)    Calcium 7.2 (*)    Albumin 2.5 (*)    Alkaline Phosphatase 168 (*)    All other components within normal limits  LACTIC ACID, PLASMA - Abnormal; Notable for the following components:   Lactic Acid, Venous 2.6 (*)    All other components within normal limits  CULTURE, BLOOD (ROUTINE X 2)  CULTURE, BLOOD (ROUTINE X 2)  SARS CORONAVIRUS 2 (TAT 6-24 HRS)  URINE CULTURE  LACTIC ACID, PLASMA  URINALYSIS, ROUTINE W REFLEX MICROSCOPIC    ____________________________________________  EKG: Sinus tachycardia, VR 105. PR 151, QRS 100, QTc 475. RAD, RAE with no ST elevations or depressions. No signs of acute ischemia or infarct. ________________________________________  RADIOLOGY All imaging, including plain films, CT scans, and ultrasounds, independently reviewed by me, and interpretations confirmed via formal radiology reads.  ED MD interpretation:   CXR: New hazy opacity left mid lung and right mid to upper lung consistent with PNA  Official radiology report(s): Dg Chest Portable 1 View  Result Date: 02/11/2019 CLINICAL DATA:  Cough and shortness-of-breath. History of lung cancer. EXAM: PORTABLE CHEST 1 VIEW COMPARISON:  10/13/2018, chest CT 01/11/2019 FINDINGS: Lungs are adequately inflated with hazy consolidation over the left midlung with patchy airspace density over the right mid to upper lung. The right midlung opacification was present on the previous CT scan. No effusion. Cardiomediastinal silhouette and remainder of the exam is unchanged. IMPRESSION: New hazy airspace consolidation over the left midlung suggesting infection. Mild patchy airspace opacification over the right mid to upper lung which was present  on the recent CT and likely represents infectious/inflammatory process, although metastatic disease is possible. Electronically Signed   By: Marin Olp M.D.   On: 02/11/2019 13:18    ____________________________________________  PROCEDURES   Procedure(s) performed (including  Critical Care):  .Critical Care Performed by: Duffy Bruce, MD Authorized by: Duffy Bruce, MD   Critical care provider statement:    Critical care time (minutes):  35   Critical care time was exclusive of:  Separately billable procedures and treating other patients and teaching time   Critical care was necessary to treat or prevent imminent or life-threatening deterioration of the following conditions:  Cardiac failure, circulatory failure, respiratory failure and sepsis   Critical care was time spent personally by me on the following activities:  Development of treatment plan with patient or surrogate, discussions with consultants, evaluation of patient's response to treatment, examination of patient, obtaining history from patient or surrogate, ordering and performing treatments and interventions, ordering and review of laboratory studies, ordering and review of radiographic studies, pulse oximetry, re-evaluation of patient's condition and review of old charts   I assumed direction of critical care for this patient from another provider in my specialty: no      ____________________________________________  INITIAL IMPRESSION / MDM / [place] / ED COURSE  As part of my medical decision making, I reviewed the following data within the electronic MEDICAL RECORD NUMBER Notes from prior ED visits and [place] Controlled Substance Database      *Alger Kerstein was evaluated in Emergency Department on 02/11/2019 for the symptoms described in the history of present illness. He was evaluated in the context of the global COVID-19 pandemic, which necessitated consideration that the patient might be at risk for  infection with the SARS-CoV-2 virus that causes COVID-19. Institutional protocols and algorithms that pertain to the evaluation of patients at risk for COVID-19 are in a state of rapid change based on information released by regulatory bodies including the CDC and federal and state organizations. These policies and algorithms were followed during the patient's care in the ED.  Some ED evaluations and interventions may be delayed as a result of limited staffing during the pandemic.*   Clinical Course as of Feb 10 1321  Fri Feb 11, 6472  6957 53 year old male with history of lung cancer here with lightheadedness, dizziness, generalized weakness, and mild chest pain.  Differential includes pneumonia, progression of cancer, less likely PE given that he is adherent with his Eliquis, dehydration secondary to chemotherapy, electrolyte instability, less likely ACS.  EKG is nonischemic.  Will start fluids, follow-up chest x-ray and labs.  Low threshold for initiation of antibiotics.  Blood pressure 90 over 50s here.   [CI]  1320 Lab work shows leukocytosis with left shift, baseline chronic anemia.  CMP with mild hyponatremia, likely combination of his pulmonary process as well as dehydration.  Lactic acid 2.6.  Code sepsis in place, broad-spectrum ABX and IVF ordered. BP improving.   [CI]    Clinical Course User Index [CI] Duffy Bruce, MD    Medical Decision Making: As above. Admit for sepsis 2/2 PNA.  ____________________________________________  FINAL CLINICAL IMPRESSION(S) / ED DIAGNOSES  Final diagnoses:  Sepsis due to pneumonia ([place])  Dehydration  Lactic acidosis     MEDICATIONS GIVEN DURING THIS VISIT:  Medications  vancomycin (VANCOCIN) IVPB 1000 mg/200 mL premix (has no administration in time range)  ipratropium-albuterol (DUONEB) 0.5-2.5 (3) MG/3ML nebulizer solution 3 mL (0 mLs Nebulization Hold 02/11/19 1309)  sodium chloride 0.9 % bolus 1,000 mL (1,000 mLs Intravenous New  Bag/Given 02/11/19 1229)  sodium chloride 0.9 % bolus 1,000 mL (1,000 mLs Intravenous New Bag/Given 02/11/19 1229)  ceFEPIme (MAXIPIME) 2 g in sodium chloride 0.9 % 100  mL IVPB (2 g Intravenous New Bag/Given 02/11/19 1247)     ED Discharge Orders    None       Note:  This document was prepared using Dragon voice recognition software and may include unintentional dictation errors.   Duffy Bruce, MD 02/11/19 1322

## 2019-02-11 NOTE — Telephone Encounter (Signed)
Go to ER. Call 911

## 2019-02-11 NOTE — ED Notes (Signed)
1st lactic acid result showing it was collected at 1406, was actually collected 1220. Suspect 1st result was connected to 2nd test in computer. Lab notified, state they will add a new order so pt's 2nd result will show appropriately.

## 2019-02-11 NOTE — Telephone Encounter (Addendum)
Call returned to Ms Alexander [name] and informed that he needs to go to the ER and to call 911. She stated "I will do it, thank you"

## 2019-02-11 NOTE — Progress Notes (Signed)
CODE SEPSIS - PHARMACY COMMUNICATION  **Broad Spectrum Antibiotics should be administered within 1 hour of Sepsis diagnosis**  Time Code Sepsis Called/Page Received: 1231  Antibiotics Ordered: Cefepime,Vancomycin  Time of 1st antibiotic administration: 1247  Additional action taken by pharmacy: none  If necessary, Name of Provider/Nurse Contacted: n/a    Pearla Dubonnet ,PharmD Clinical Pharmacist  02/11/2019  12:41 PM

## 2019-02-11 NOTE — ED Triage Notes (Signed)
Pt presents to ED for "changing BP and O2 level." Pt states BP at home 80s/50s, O2 sat had difficult time reading. On arrival BP 100/52 and sat 97% on RA. Pt has hx lung cancer and COPD, still smokes. Last chemo within 1 wk, to start radiation soon. Pt called oncologist's office and was told to come to ED by EMS immediately. No c/o SOB or chest pain at this time. EMS report rhonchi R lung, left clear. Cough present at baseline, unchanged.

## 2019-02-11 NOTE — Progress Notes (Signed)
Pharmacy Antibiotic Note  Alexander [name] is a 53 y.o. male admitted on 02/11/2019 with pneumonia.  Pharmacy has been consulted for cefepime and vancomycin dosing. Pt with history of lung cancer on chemotherapy.   Cefepime 2 g IV x1 and Vancomycin 1 g IV x1 loading dose given in the ED at 1352  Plan: Vancomycin 500 mg IV Q 12 hrs. Goal AUC 400-550. Expected AUC: 454 SCr used: 0.88 Expected Cmin: 13.2  Cefepime 2 g IV q8h ordered  Will order MRSA PCR screen; SCr for AM to monitor renal function  Height: 5\' 9"  (175.3 cm) Weight: 110 lb (49.9 kg) IBW/kg (Calculated) : 70.7  Temp (24hrs), Avg:98.7 F (37.1 C), Min:98.7 F (37.1 C), Max:98.7 F (37.1 C)  Recent Labs  Lab 02/11/19 1220 02/11/19 1356 02/11/19 1406  WBC 22.6*  --   --   CREATININE 0.88  --   --   LATICACIDVEN  --  2.4* 2.6*    Estimated Creatinine Clearance: 68.5 mL/min (by C-G formula based on SCr of 0.88 mg/dL).    Allergies  Allergen Reactions  . Doxycycline Swelling  . Penicillins Swelling    Did it involve swelling of the face/tongue/throat, SOB, or low BP? Unknown Did it involve sudden or severe rash/hives, skin peeling, or any reaction on the inside of your mouth or nose? Unknown Did you need to seek medical attention at a hospital or doctor's office? Unknown When did it last happen?Childhood If all above answers are "NO", may proceed with cephalosporin use.    Antimicrobials this admission: Cefepime 9/18 >> Vancomycin 9/18 >>  Dose adjustments this admission:   Microbiology results: 9/18 BCx: collected  9/18 UCx: collected  MRSA PCR: ordered  Thank you for allowing pharmacy to be a part of this patient's care.  Rocky Morel 02/11/2019 2:43 PM

## 2019-02-11 NOTE — ED Notes (Signed)
Per EDP Isaacs, ok to hold neb tx at this time d/t pt in NAD and currently being tested for COVID

## 2019-02-11 NOTE — Progress Notes (Signed)
Family Meeting Note  Advance Directive:yes  Today a meeting took place with the Patient and brother.  The following clinical team members were present during this meeting:MD  The following were discussed:Patient's diagnosis: Stage IIIb adenocarcinoma of lung, healthcare associated pneumonia, Patient's progosis: Unable to determine and Goals for treatment: DNR  Patient made it clear that he would like to be DNR in any adverse event.  His brother was present in the room during my visit.  Additional follow-up to be provided: Oncology  Time spent during discussion:20 minutes  Vaughan Basta, MD

## 2019-02-11 NOTE — Telephone Encounter (Signed)
911 to ER.

## 2019-02-11 NOTE — H&P (Signed)
[facility] NAME: Alexander [name]    MR#:  [id number]  DATE OF BIRTH:  [birth year]  DATE OF ADMISSION:  02/11/2019  PRIMARY CARE PHYSICIAN: Patient, No Pcp Per   REQUESTING/REFERRING PHYSICIAN: Isaacs  CHIEF COMPLAINT:   Chief Complaint  Patient presents with  . Hypotension    HISTORY OF PRESENT ILLNESS: Alexander [name]  is a 53 y.o. male with a known history of adenocarcinoma of lung, COPD-status post chemotherapy last cycle was 1 week ago.  Also started working on arrangements for radiation therapy. For last few days he has worsening in his cough but white-colored sputum.  He has some chills.  He has some dizziness and generalized weakness so came to emergency room. Noted to have pneumonia on chest x-ray.  COVID-19 test is sent which is not the rapid 1 so results are still pending.  ER ordered antibiotic and advised hospitalist team to admit.  PAST MEDICAL HISTORY:   Past Medical History:  Diagnosis Date  . Cancer ([place])   . COPD (chronic obstructive pulmonary disease) ([place])   . Lung mass     PAST SURGICAL HISTORY:  Past Surgical History:  Procedure Laterality Date  . ELECTROMAGNETIC NAVIGATION BROCHOSCOPY Left 10/13/2018   Procedure: ELECTROMAGNETIC NAVIGATION BRONCHOSCOPY - LEFT;  Surgeon: Tyler Pita, MD;  Location: [facility];  Service: Cardiopulmonary;  Laterality: Left;  . ENDOBRONCHIAL ULTRASOUND N/A 03/11/2018   Procedure: ENDOBRONCHIAL ULTRASOUND;  Surgeon: Tyler Pita, MD;  Location: [facility];  Service: Cardiopulmonary;  Laterality: N/A;  . ENDOBRONCHIAL ULTRASOUND Left 10/13/2018   Procedure: ENDOBRONCHIAL ULTRASOUND - LEFT;  Surgeon: Tyler Pita, MD;  Location: [facility];  Service: Cardiopulmonary;  Laterality: Left;  . none    . PORTA CATH INSERTION N/A 03/12/2018   Procedure: PORTA CATH INSERTION, thigh;  Surgeon: Katha Cabal, MD;  Location: [facility];  Service: Cardiovascular;   Laterality: N/A;  . SUBXYPHOID PERICARDIAL WINDOW N/A 06/30/2018   Procedure: SUBXYPHOID PERICARDIAL WINDOW;  Surgeon: [name], MD;  Location: [facility];  Service: Open Heart Surgery;  Laterality: N/A;  . TEE WITHOUT CARDIOVERSION N/A 06/30/2018   Procedure: TRANSESOPHAGEAL ECHOCARDIOGRAM (TEE);  Surgeon: [name], MD;  Location: [facility];  Service: Open Heart Surgery;  Laterality: N/A;    SOCIAL HISTORY:  Social History   Tobacco Use  . Smoking status: Current Every Day Smoker    Packs/day: 0.25  . Smokeless tobacco: Never Used  . Tobacco comment: 4-5 cig a day  Substance Use Topics  . Alcohol use: Not Currently    FAMILY HISTORY: History reviewed. No pertinent family history.  DRUG ALLERGIES:  Allergies  Allergen Reactions  . Doxycycline Swelling  . Penicillins Swelling    Did it involve swelling of the face/tongue/throat, SOB, or low BP? Unknown Did it involve sudden or severe rash/hives, skin peeling, or any reaction on the inside of your mouth or nose? Unknown Did you need to seek medical attention at a hospital or doctor's office? Unknown When did it last happen?Childhood If all above answers are "NO", may proceed with cephalosporin use.    REVIEW OF SYSTEMS:   CONSTITUTIONAL: No fever, have fatigue or weakness.  EYES: No blurred or double vision.  EARS, NOSE, AND THROAT: No tinnitus or ear pain.  RESPIRATORY: Have cough, shortness of breath, no wheezing or hemoptysis.  CARDIOVASCULAR: No chest pain, orthopnea, edema.  GASTROINTESTINAL: No nausea, vomiting, diarrhea or abdominal pain.  GENITOURINARY: No  dysuria, hematuria.  ENDOCRINE: No polyuria, nocturia,  HEMATOLOGY: No anemia, easy bruising or bleeding SKIN: No rash or lesion. MUSCULOSKELETAL: No joint pain or arthritis.   NEUROLOGIC: No tingling, numbness, weakness.  PSYCHIATRY: No anxiety or depression.   MEDICATIONS AT HOME:  Prior to Admission medications   Medication Sig Start Date End  Date Taking? Authorizing Provider  ELIQUIS 5 MG TABS tablet TAKE 1 TABLET BY MOUTH TWICE DAILY 01/19/19   Sindy Guadeloupe, MD  guaiFENesin (MUCINEX) 600 MG 12 hr tablet Take 600 mg by mouth 2 (two) times daily as needed.    [provider]  Ipratropium-Albuterol (COMBIVENT RESPIMAT) 20-100 MCG/ACT AERS respimat Inhale 1 puff into the lungs every 6 (six) hours as needed for wheezing or shortness of breath. 01/25/19   Sindy Guadeloupe, MD  levofloxacin (LEVAQUIN) 500 MG tablet Take 1 tablet (500 mg total) by mouth daily. 01/25/19   Sindy Guadeloupe, MD  lidocaine-prilocaine (EMLA) cream Apply to affected area once 11/02/18   Sindy Guadeloupe, MD  megestrol (MEGACE) 400 MG/10ML suspension Take 10 mLs (400 mg total) by mouth 2 (two) times daily. Patient not taking: Reported on 02/03/2019 11/29/18   Sindy Guadeloupe, MD  OLANZapine (ZYPREXA) 10 MG tablet Take 1 tablet (10 mg total) by mouth at bedtime. 01/04/19   Sindy Guadeloupe, MD  ondansetron (ZOFRAN) 8 MG tablet Take 1 tablet (8 mg total) by mouth 2 (two) times daily as needed for refractory nausea / vomiting. Start on day 3 after carboplatin chemo. 11/02/18   Sindy Guadeloupe, MD  Oxycodone HCl 10 MG TABS Take 1 tablet (10 mg total) by mouth every 4 (four) hours as needed. 01/25/19   Sindy Guadeloupe, MD  polyethylene glycol (MIRALAX / GLYCOLAX) 17 g packet Take 17 g by mouth 2 (two) times a week.    [provider]  prochlorperazine (COMPAZINE) 10 MG tablet Take 1 tablet (10 mg total) by mouth every 6 (six) hours as needed (Nausea or vomiting). 11/02/18   Sindy Guadeloupe, MD  apixaban (ELIQUIS) 5 MG TABS tablet Take 1 tablet (5 mg total) by mouth 2 (two) times daily for 30 days. 10/13/18   Sindy Guadeloupe, MD      PHYSICAL EXAMINATION:   VITAL SIGNS: Blood pressure 95/72, pulse (!) 110, temperature 98.7 F (37.1 C), temperature source Oral, resp. rate 16, height 5\' 9"  (1.753 m), weight 49.9 kg, SpO2 97 %.  GENERAL:  53 y.o.-year-old patient lying in the bed  with no acute distress.  EYES: Pupils equal, round, reactive to light and accommodation. No scleral icterus. Extraocular muscles intact.  HEENT: Head atraumatic, normocephalic. Oropharynx and nasopharynx clear.  NECK:  Supple, no jugular venous distention. No thyroid enlargement, no tenderness.  LUNGS: Normal breath sounds bilaterally, no wheezing, some crepitation. No use of accessory muscles of respiration.  CARDIOVASCULAR: S1, S2 normal. No murmurs, rubs, or gallops.  ABDOMEN: Soft, nontender, nondistended. Bowel sounds present. No organomegaly or mass.  EXTREMITIES: No pedal edema, cyanosis, or clubbing.  NEUROLOGIC: Cranial nerves II through XII are intact. Muscle strength 5/5 in all extremities. Sensation intact. Gait not checked.  PSYCHIATRIC: The patient is alert and oriented x 3.  SKIN: No obvious rash, lesion, or ulcer.   LABORATORY PANEL:   CBC Recent Labs  Lab 02/11/19 1220  WBC 22.6*  HGB 8.4*  HCT 27.0*  PLT 150  MCV 94.1  MCH 29.3  MCHC 31.1  RDW 19.0*  LYMPHSABS 0.7  MONOABS  0.8  EOSABS 0.0  BASOSABS 0.0   ------------------------------------------------------------------------------------------------------------------  Chemistries  Recent Labs  Lab 02/11/19 1220  NA 131*  K 4.3  CL 92*  CO2 26  GLUCOSE 104*  BUN 13  CREATININE 0.88  CALCIUM 7.2*  AST 24  ALT 8  ALKPHOS 168*  BILITOT 1.1   ------------------------------------------------------------------------------------------------------------------ estimated creatinine clearance is 68.5 mL/min (by C-G formula based on SCr of 0.88 mg/dL). ------------------------------------------------------------------------------------------------------------------ No results for input(s): TSH, T4TOTAL, T3FREE, THYROIDAB in the last 72 hours.  Invalid input(s): FREET3   Coagulation profile No results for input(s): INR, PROTIME in the last 168  hours. ------------------------------------------------------------------------------------------------------------------- No results for input(s): DDIMER in the last 72 hours. -------------------------------------------------------------------------------------------------------------------  Cardiac Enzymes No results for input(s): CKMB, TROPONINI, MYOGLOBIN in the last 168 hours.  Invalid input(s): CK ------------------------------------------------------------------------------------------------------------------ Invalid input(s): POCBNP  ---------------------------------------------------------------------------------------------------------------  Urinalysis    Component Value Date/Time   COLORURINE YELLOW (A) 09/26/2018 0840   APPEARANCEUR CLEAR (A) 09/26/2018 0840   LABSPEC 1.024 09/26/2018 0840   PHURINE 5.0 09/26/2018 0840   GLUCOSEU NEGATIVE 09/26/2018 0840   HGBUR SMALL (A) 09/26/2018 0840   BILIRUBINUR NEGATIVE 09/26/2018 0840   KETONESUR 5 (A) 09/26/2018 0840   PROTEINUR NEGATIVE 09/26/2018 0840   NITRITE NEGATIVE 09/26/2018 0840   LEUKOCYTESUR NEGATIVE 09/26/2018 0840     RADIOLOGY: Dg Chest Portable 1 View  Result Date: 02/11/2019 CLINICAL DATA:  Cough and shortness-of-breath. History of lung cancer. EXAM: PORTABLE CHEST 1 VIEW COMPARISON:  10/13/2018, chest CT 01/11/2019 FINDINGS: Lungs are adequately inflated with hazy consolidation over the left midlung with patchy airspace density over the right mid to upper lung. The right midlung opacification was present on the previous CT scan. No effusion. Cardiomediastinal silhouette and remainder of the exam is unchanged. IMPRESSION: New hazy airspace consolidation over the left midlung suggesting infection. Mild patchy airspace opacification over the right mid to upper lung which was present on the recent CT and likely represents infectious/inflammatory process, although metastatic disease is possible. Electronically  Signed   By: Marin Olp M.D.   On: 02/11/2019 13:18    EKG: Orders placed or performed during the hospital encounter of 02/11/19  . ED EKG 12-Lead  . ED EKG 12-Lead    IMPRESSION AND PLAN:  *Healthcare associated pneumonia Patient has lung cancer and is on chemotherapy. I will give broad-spectrum antibiotics. Alcohol oncologist consult to help manage further.  *Stage IIIB adenocarcinoma of lung He is on chemotherapy from cancer center and also started working on radiation arrangements. Oncology consult as mentioned above.  *Anemia Likely due to chemotherapy.  Continue to monitor.  *Active smoking He still smokes and planning on cutting down. I counseled to quit smoking and offered nicotine patch while in the hospital.  Time spent 4 minutes.   All the records are reviewed and case discussed with ED provider. Management plans discussed with the patient, family and they are in agreement.  CODE STATUS: Code Status History    Date Active Date Inactive Code Status Order ID Comments User Context   09/25/2018 2320 09/27/2018 1523 Full Code [id number]  Lance Coon, MD Inpatient   06/30/2018 0421 07/03/2018 1452 Partial Code [id number]  Arnell Asal, NP Inpatient   06/30/2018 0236 06/30/2018 0421 Full Code [id number]  Frederik Pear, MD Inpatient   03/09/2018 1812 03/13/2018 1920 Full Code [id number]  Saundra Shelling, MD Inpatient   Advance Care Planning Activity       TOTAL TIME TAKING CARE OF THIS PATIENT: 45 minutes.  Patient's  brother was present in the room during my visit.  Vaughan Basta M.D on 02/11/2019   Between 7am to 6pm - Pager - [phone]  After 6pm go to [email or web address] - password [email or web address] [facility]  Office  [phone]  CC: Primary care physician; Patient, No Pcp Per   Note: This dictation was prepared with Dragon dictation along with smaller phrase technology. Any transcriptional errors that result from this process are  unintentional.

## 2019-02-11 NOTE — Consult Note (Signed)
Hematology/Oncology Consult note [facility] Telephone:([phone] [phone]  Patient Care Team: Patient, No Pcp Per as PCP - General ([place]) Telford Nab, RN as Registered Nurse [place], Kirt Boys, NP as Nurse Practitioner [facility] and Palliative Medicine)   Name of the patient: Alexander [name]  [id number]  [birth year]    Reason for consult: Metastatic lung cancer   Requesting physician Dr. Anselm Jungling  Date of visit: 02/11/2019]    History of presenting illness-patient is a 53 year old male who was initially diagnosed with stage III lung cancer a year ago and then had disease progression with bone metastases and local lung progression.  He was recently started on carbotaxol and Alimta and has completed 4 cycles of chemotherapy on 01/04/2019.  Scans after 4 cycles showed increase in the size of the left lower lobe mass but no other findings of new metastatic disease.  Plan was to continue maintenance Alimta and pursue palliative radiation to the left lower lobe lung mass.  He last received maintenance Alimta on 02/04/2019.  Overall patient's performance status has been on the decline over the last 3 to 4 months.  He has been losing weight and his appetite has been poor.  He is now admitted to the hospital for symptoms of weakness hypotension as well as worsening shortness of breath and was found to have healthcare associated pneumonia.  He also follows up with palliative care as an outpatient.  Presently patient complains of exertional sob, fatigue and left sided chest wall pain  ECOG PS- 2  Pain scale- 3   Review of systems- Review of Systems  Constitutional: Positive for malaise/fatigue and weight loss. Negative for chills and fever.  HENT: Negative for congestion, ear discharge and nosebleeds.   Eyes: Negative for blurred vision.  Respiratory: Positive for shortness of breath. Negative for cough, hemoptysis, sputum production and wheezing.    Cardiovascular: Negative for chest pain, palpitations, orthopnea and claudication.  Gastrointestinal: Negative for abdominal pain, blood in stool, constipation, diarrhea, heartburn, melena, nausea and vomiting.  Genitourinary: Negative for dysuria, flank pain, frequency, hematuria and urgency.  Musculoskeletal: Negative for back pain, joint pain and myalgias.  Skin: Negative for rash.  Neurological: Negative for dizziness, tingling, focal weakness, seizures, weakness and headaches.  Endo/Heme/Allergies: Does not bruise/bleed easily.  Psychiatric/Behavioral: Negative for depression and suicidal ideas. The patient does not have insomnia.     Allergies  Allergen Reactions  . Doxycycline Swelling  . Penicillins Swelling    Did it involve swelling of the face/tongue/throat, SOB, or low BP? Unknown Did it involve sudden or severe rash/hives, skin peeling, or any reaction on the inside of your mouth or nose? Unknown Did you need to seek medical attention at a hospital or doctor's office? Unknown When did it last happen?Childhood If all above answers are "NO", may proceed with cephalosporin use.    Patient Active Problem List   Diagnosis Date Noted  . Bone metastases ([place]) 10/21/2018  . Sepsis ([place]) 09/25/2018  . HCAP (healthcare-associated pneumonia) 09/25/2018  . COPD (chronic obstructive pulmonary disease) ([place]) 09/25/2018  . Malnutrition of moderate degree 07/03/2018  . Acute respiratory failure ([place])   . Pericardial effusion with cardiac tamponade 06/30/2018  . Adenocarcinoma, lung, right ([place])   . History of cancer chemotherapy   . History of radiation therapy   . Elevated serum creatinine 06/10/2018  . Goals of care, counseling/discussion 06/03/2018  . Lung cancer ([place]) 03/16/2018  . 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 Cancer ([place])   . COPD (chronic obstructive pulmonary disease) ([place])   . Lung mass      Past Surgical History:   Procedure Laterality Date  . ELECTROMAGNETIC NAVIGATION BROCHOSCOPY Left 10/13/2018   Procedure: ELECTROMAGNETIC NAVIGATION BRONCHOSCOPY - LEFT;  Surgeon: Tyler Pita, MD;  Location: [facility] OR[facility];  Service: Cardiopulmonary;  Laterality: Left;  . ENDOBRONCHIAL ULTRASOUND N/A 03/11/2018   Procedure: ENDOBRONCHIAL ULTRASOUND;  Surgeon: Tyler Pita, MD;  Location: [facility];  Service: Cardiopulmonary;  Laterality: N/A;  . ENDOBRONCHIAL ULTRASOUND Left 10/13/2018   Procedure: ENDOBRONCHIAL ULTRASOUND - LEFT;  Surgeon: Tyler Pita, MD;  Location: [facility] OR[facility];  Service: Cardiopulmonary;  Laterality: Left;  . none    . PORTA CATH INSERTION N/A 03/12/2018   Procedure: PORTA CATH INSERTION, thigh;  Surgeon: Katha Cabal, MD;  Location: [place] [facility];  Service: Cardiovascular;  Laterality: N/A;  . SUBXYPHOID PERICARDIAL WINDOW N/A 06/30/2018   Procedure: SUBXYPHOID PERICARDIAL WINDOW;  Surgeon: [name], MD;  Location: [place];  Service: Open Heart Surgery;  Laterality: N/A;  . TEE WITHOUT CARDIOVERSION N/A 06/30/2018   Procedure: TRANSESOPHAGEAL ECHOCARDIOGRAM (TEE);  Surgeon: [name], MD;  Location: [place];  Service: Open Heart Surgery;  Laterality: N/A;    Social History   Socioeconomic History  . Marital status: Divorced    Spouse name: Not on file  . Number of children: Not on file  . Years of education: Not on file  . Highest education level: Not on file  Occupational History  . Occupation: Cabin crew  Social Needs  . Financial resource strain: Not on file  . Food insecurity    Worry: Not on file    Inability: Not on file  . Transportation needs    Medical: Not on file    Non-medical: Not on file  Tobacco Use  . Smoking status: Current Every Day Smoker    Packs/day: 0.25  . Smokeless tobacco: Never Used  . Tobacco comment: 4-5 cig a day  Substance and Sexual Activity  . Alcohol use: Not Currently  . Drug use: Not Currently    Types:  Marijuana  . Sexual activity: Not Currently  Lifestyle  . Physical activity    Days per week: Not on file    Minutes per session: Not on file  . Stress: Not on file  Relationships  . Social Herbalist on phone: Not on file    Gets together: Not on file    Attends religious service: Not on file    Active member of club or organization: Not on file    Attends meetings of clubs or organizations: Not on file    Relationship status: Not on file  . Intimate partner violence    Fear of current or ex partner: Not on file    Emotionally abused: Not on file    Physically abused: Not on file    Forced sexual activity: Not on file  Other Topics Concern  . Not on file  Social History Narrative  . Not on file     History reviewed. No pertinent family history.   Current Facility-Administered Medications:  .  acetaminophen (TYLENOL) tablet 650 mg, 650 mg, Oral, Q6H PRN, Vaughan Basta, MD .  ipratropium-albuterol (DUONEB) 0.5-2.5 (3) MG/3ML nebulizer solution 3 mL, 3 mL, Nebulization, Once, Duffy Bruce, MD, Stopped at 02/11/19 1309 .  ipratropium-albuterol (DUONEB) 0.5-2.5 (3) MG/3ML nebulizer solution 3 mL, 3 mL, Nebulization,  Earma Reading, MD .  vancomycin (VANCOCIN) IVPB 1000 mg/200 mL premix, 1,000 mg, Intravenous, Once, Duffy Bruce, MD, Last Rate: 200 mL/hr at 02/11/19 1352, 1,000 mg at 02/11/19 1352  Current Outpatient Medications:  .  ELIQUIS 5 MG TABS tablet, TAKE 1 TABLET BY MOUTH TWICE DAILY, Disp: 180 tablet, Rfl: 3 .  guaiFENesin (MUCINEX) 600 MG 12 hr tablet, Take 600 mg by mouth 2 (two) times daily as needed., Disp: , Rfl:  .  Ipratropium-Albuterol (COMBIVENT RESPIMAT) 20-100 MCG/ACT AERS respimat, Inhale 1 puff into the lungs every 6 (six) hours as needed for wheezing or shortness of breath., Disp: 4 g, Rfl: 2 .  levofloxacin (LEVAQUIN) 500 MG tablet, Take 1 tablet (500 mg total) by mouth daily., Disp: 10 tablet, Rfl: 0 .   lidocaine-prilocaine (EMLA) cream, Apply to affected area once, Disp: 30 g, Rfl: 3 .  megestrol (MEGACE) 400 MG/10ML suspension, Take 10 mLs (400 mg total) by mouth 2 (two) times daily. (Patient not taking: Reported on 02/03/2019), Disp: 240 mL, Rfl: 0 .  OLANZapine (ZYPREXA) 10 MG tablet, Take 1 tablet (10 mg total) by mouth at bedtime., Disp: 30 tablet, Rfl: 0 .  ondansetron (ZOFRAN) 8 MG tablet, Take 1 tablet (8 mg total) by mouth 2 (two) times daily as needed for refractory nausea / vomiting. Start on day 3 after carboplatin chemo., Disp: 30 tablet, Rfl: 1 .  Oxycodone HCl 10 MG TABS, Take 1 tablet (10 mg total) by mouth every 4 (four) hours as needed., Disp: 120 tablet, Rfl: 0 .  polyethylene glycol (MIRALAX / GLYCOLAX) 17 g packet, Take 17 g by mouth 2 (two) times a week., Disp: , Rfl:  .  prochlorperazine (COMPAZINE) 10 MG tablet, Take 1 tablet (10 mg total) by mouth every 6 (six) hours as needed (Nausea or vomiting)., Disp: 30 tablet, Rfl: 1  Facility-Administered Medications Ordered in Other Encounters:  .  sodium chloride flush (NS) 0.9 % injection 10 mL, 10 mL, Intravenous, Once, Sindy Guadeloupe, MD   Physical exam:  Vitals:   02/11/19 1359 02/11/19 1400 02/11/19 1401 02/11/19 1411  BP:  95/72    Pulse: (!) 106 (!) 107 (!) 110   Resp: 19 (!) 22 16   Temp:      TempSrc:      SpO2: 98% 97% 97% 98%  Weight:      Height:       Physical Exam HENT:     Head: Normocephalic and atraumatic.  Eyes:     Pupils: Pupils are equal, round, and reactive to light.  Neck:     Musculoskeletal: Normal range of motion.  Cardiovascular:     Rate and Rhythm: Regular rhythm. Tachycardia present.     Heart sounds: Normal heart sounds.  Pulmonary:     Effort: Pulmonary effort is normal.     Breath sounds: Normal breath sounds.  Abdominal:     General: Bowel sounds are normal.     Palpations: Abdomen is soft.  Skin:    General: Skin is warm and dry.  Neurological:     Mental Status: He is  alert and oriented to person, place, and time.        CMP Latest Ref Rng & Units 02/11/2019  Glucose 70 - 99 mg/dL 104(H)  BUN 6 - 20 mg/dL 13  Creatinine 0.61 - 1.24 mg/dL 0.88  Sodium 135 - 145 mmol/L 131(L)  Potassium 3.5 - 5.1 mmol/L 4.3  Chloride 98 - 111 mmol/L 92(L)  CO2 22 - 32 mmol/L 26  Calcium 8.9 - 10.3 mg/dL 7.2(L)  Total Protein 6.5 - 8.1 g/dL 7.7  Total Bilirubin 0.3 - 1.2 mg/dL 1.1  Alkaline Phos 38 - 126 U/L 168(H)  AST 15 - 41 U/L 24  ALT 0 - 44 U/L 8   CBC Latest Ref Rng & Units 02/11/2019  WBC 4.0 - 10.5 K/uL 22.6(H)  Hemoglobin 13.0 - 17.0 g/dL 8.4(L)  Hematocrit 39.0 - 52.0 % 27.0(L)  Platelets 150 - 400 K/uL 150    @IMAGES @  Nm Bone Scan Whole Body  Result Date: 01/13/2019 CLINICAL DATA:  Stage IV lung cancer, bone metastases, on chemotherapy, question response to treatment EXAM: NUCLEAR MEDICINE WHOLE BODY BONE SCAN TECHNIQUE: Whole body anterior and posterior images were obtained approximately 3 hours after intravenous injection of radiopharmaceutical. RADIOPHARMACEUTICALS:  21.20 mCi Technetium-38m MDP IV COMPARISON:  None Correlation: CT chest abdomen pelvis 01/09/2019, PET-CT 09/28/2018 FINDINGS: Normal ectatic of tracer within the axial and appendicular skeleton. No scintigraphic evidence of osseous metastatic disease. Specifically, no abnormal uptake of tracer is seen at the site of abnormal FDG accumulation at the RIGHT acetabulum identified on the prior PET-CT. Expected urinary tract and soft tissue distribution of tracer. IMPRESSION: No definite scintigraphic evidence of osseous metastatic disease. Electronically Signed   By: Lavonia Dana M.D.   On: 01/13/2019 16:36   Dg Chest Portable 1 View  Result Date: 02/11/2019 CLINICAL DATA:  Cough and shortness-of-breath. History of lung cancer. EXAM: PORTABLE CHEST 1 VIEW COMPARISON:  10/13/2018, chest CT 01/11/2019 FINDINGS: Lungs are adequately inflated with hazy consolidation over the left midlung with  patchy airspace density over the right mid to upper lung. The right midlung opacification was present on the previous CT scan. No effusion. Cardiomediastinal silhouette and remainder of the exam is unchanged. IMPRESSION: New hazy airspace consolidation over the left midlung suggesting infection. Mild patchy airspace opacification over the right mid to upper lung which was present on the recent CT and likely represents infectious/inflammatory process, although metastatic disease is possible. Electronically Signed   By: Marin Olp M.D.   On: 02/11/2019 13:18    Assessment and plan- Patient is a 53 y.o. male with stage IV lung cancer who last received maintenance Alimta on 02/04/2019 admitted for acute hypoxic respiratory failure secondary to healthcare associated pneumonia  Patient has a significantly elevated white count of 22 likely secondary to underlying malignancy as well as possible superimposed infection.  He has not received any Neulasta that would explain his leukocytosis.  Anemia secondary to underlying malignancy.  Blood cultures and copper testing is currently pending and he has been treated empirically for healthcare associated pneumonia.  Continue symptomatic management and I will see him as an outpatient and depending on performance status decide about restarting Alimta at that time.  He will continue Eliquis for SVC syndrome     Visit Diagnosis 1. Sepsis due to pneumonia ([place])   2. Dehydration   3. Lactic acidosis     Dr. Randa Evens, MD, MPH [facility] [phone] 02/11/2019 9:12 PM

## 2019-02-11 NOTE — Progress Notes (Addendum)
Pt last lactic was check at 1406 and was at 2.6 and no order to re-check. Notify prime and talked to Dr. Jannifer Franklin and states will place order. Will continue to monitor.  Update 2330: Pt lactic came back at 1.2. Will continue to monitor.  Update 0100: Pt covid-19 came back negative. Notify prime and talked to Dr. Jannifer Franklin and states will place order. Will continue to monitor.

## 2019-02-11 NOTE — Telephone Encounter (Addendum)
Hilda Blades called reporting that patient is having shallow breathing his b/p is 80/56, O2 sats mid 80's to 94%, and the veins in his chest are more swollen this week compared to last week. She is requesting a return call to discuss this. Please advise

## 2019-02-11 NOTE — ED Notes (Signed)
Date and time results received: 02/11/19 1:09 PM  Test: lactic acid Critical Value: 2.6  Name of Provider Notified: Dr. Ellender Hose  Orders Received? Or Actions Taken?: no new orders at this time

## 2019-02-12 LAB — CBC
HCT: 23.5 % — ABNORMAL LOW (ref 39.0–52.0)
Hemoglobin: 7.4 g/dL — ABNORMAL LOW (ref 13.0–17.0)
MCH: 29.4 pg (ref 26.0–34.0)
MCHC: 31.5 g/dL (ref 30.0–36.0)
MCV: 93.3 fL (ref 80.0–100.0)
Platelets: 136 10*3/uL — ABNORMAL LOW (ref 150–400)
RBC: 2.52 MIL/uL — ABNORMAL LOW (ref 4.22–5.81)
RDW: 18.8 % — ABNORMAL HIGH (ref 11.5–15.5)
WBC: 12.7 10*3/uL — ABNORMAL HIGH (ref 4.0–10.5)
nRBC: 0 % (ref 0.0–0.2)

## 2019-02-12 LAB — URINALYSIS, ROUTINE W REFLEX MICROSCOPIC
Bilirubin Urine: NEGATIVE
Glucose, UA: NEGATIVE mg/dL
Hgb urine dipstick: NEGATIVE
Ketones, ur: NEGATIVE mg/dL
Leukocytes,Ua: NEGATIVE
Nitrite: NEGATIVE
Protein, ur: NEGATIVE mg/dL
Specific Gravity, Urine: 1.027 (ref 1.005–1.030)
pH: 6 (ref 5.0–8.0)

## 2019-02-12 LAB — BASIC METABOLIC PANEL
Anion gap: 6 (ref 5–15)
BUN: 19 mg/dL (ref 6–20)
CO2: 27 mmol/L (ref 22–32)
Calcium: 6.5 mg/dL — ABNORMAL LOW (ref 8.9–10.3)
Chloride: 102 mmol/L (ref 98–111)
Creatinine, Ser: 0.89 mg/dL (ref 0.61–1.24)
GFR calc Af Amer: >60 mL/min (ref >60)
GFR calc non Af Amer: >60 mL/min (ref >60)
Glucose, Bld: 163 mg/dL — ABNORMAL HIGH (ref 70–99)
Potassium: 4.3 mmol/L (ref 3.5–5.1)
Sodium: 135 mmol/L (ref 135–145)

## 2019-02-12 LAB — PROCALCITONIN: Procalcitonin: 0.3 ng/mL

## 2019-02-12 MED ORDER — FAMOTIDINE 20 MG PO TABS
20.0000 mg | ORAL_TABLET | Freq: Once | ORAL | Status: AC
  Administered 2019-02-12: 20 mg via ORAL
  Filled 2019-02-12: qty 1

## 2019-02-12 NOTE — Plan of Care (Signed)
  Problem: Education: Goal: Knowledge of General Education information will improve Description: Including pain rating scale, medication(s)/side effects and non-pharmacologic comfort measures Outcome: Progressing   Problem: Clinical Measurements: Goal: Will remain free from infection Outcome: Progressing   

## 2019-02-12 NOTE — Plan of Care (Signed)

## 2019-02-12 NOTE — Progress Notes (Addendum)
[place] at [place] NAME: Odus Clasby    MR#:  [id number]  DATE OF BIRTH:  [birth year]  SUBJECTIVE:   Patient states he is feeling a little bit better today.  His shortness of breath has improved, although he still feels winded when he moves around.  He denies any fevers or chills.  REVIEW OF SYSTEMS:  Review of Systems  Constitutional: Negative for chills and fever.  HENT: Negative for congestion and sore throat.   Eyes: Negative for blurred vision and double vision.  Respiratory: Positive for cough and shortness of breath.   Cardiovascular: Negative for chest pain and palpitations.  Gastrointestinal: Negative for nausea and vomiting.  Genitourinary: Negative for dysuria and urgency.  Musculoskeletal: Negative for back pain and neck pain.  Neurological: Negative for dizziness and headaches.  Psychiatric/Behavioral: Negative for depression. The patient is not nervous/anxious.     DRUG ALLERGIES:   Allergies  Allergen Reactions  . Doxycycline Swelling  . Penicillins Swelling    Did it involve swelling of the face/tongue/throat, SOB, or low BP? Unknown Did it involve sudden or severe rash/hives, skin peeling, or any reaction on the inside of your mouth or nose? Unknown Did you need to seek medical attention at a hospital or doctor's office? Unknown When did it last happen?Childhood If all above answers are "NO", may proceed with cephalosporin use.   VITALS:  Blood pressure 98/78, pulse 91, temperature 98.1 F (36.7 C), temperature source Oral, resp. rate 18, height 5\' 9"  (1.753 m), weight 50.4 kg, SpO2 100 %. PHYSICAL EXAMINATION:  Physical Exam  GENERAL:  Laying in the bed with no acute distress. Chronically ill-appearing. HEENT: Head atraumatic, normocephalic. Pupils equal, round, reactive to light and accommodation. No scleral icterus. Extraocular muscles intact. Oropharynx and nasopharynx clear.  NECK:  Supple, no  jugular venous distention. No thyroid enlargement. LUNGS: + Scattered rhonchi. No use of accessory muscles of respiration.  CARDIOVASCULAR: RRR, S1, S2 normal. No murmurs, rubs, or gallops.  ABDOMEN: Soft, nontender, nondistended. Bowel sounds present.  EXTREMITIES: No pedal edema, cyanosis, or clubbing.  NEUROLOGIC: CN 2-12 intact, no focal deficits. 5/5 muscle strength throughout all extremities. Sensation intact throughout. Gait not checked.  PSYCHIATRIC: The patient is alert and oriented x 3.  SKIN: No obvious rash, lesion, or ulcer.  LABORATORY PANEL:  Male CBC Recent Labs  Lab 02/12/19 0627  WBC 12.7*  HGB 7.4*  HCT 23.5*  PLT 136*   ------------------------------------------------------------------------------------------------------------------ Chemistries  Recent Labs  Lab 02/11/19 1220 02/12/19 0627  NA 131* 135  K 4.3 4.3  CL 92* 102  CO2 26 27  GLUCOSE 104* 163*  BUN 13 19  CREATININE 0.88 0.89  CALCIUM 7.2* 6.5*  AST 24  --   ALT 8  --   ALKPHOS 168*  --   BILITOT 1.1  --    RADIOLOGY:  Dg Chest Portable 1 View  Result Date: 02/11/2019 CLINICAL DATA:  Cough and shortness-of-breath. History of lung cancer. EXAM: PORTABLE CHEST 1 VIEW COMPARISON:  10/13/2018, chest CT 01/11/2019 FINDINGS: Lungs are adequately inflated with hazy consolidation over the left midlung with patchy airspace density over the right mid to upper lung. The right midlung opacification was present on the previous CT scan. No effusion. Cardiomediastinal silhouette and remainder of the exam is unchanged. IMPRESSION: New hazy airspace consolidation over the left midlung suggesting infection. Mild patchy airspace opacification over the right mid to upper lung which was present on the recent  CT and likely represents infectious/inflammatory process, although metastatic disease is possible. Electronically Signed   By: Marin Olp M.D.   On: 02/11/2019 13:18   ASSESSMENT AND PLAN:   Sepsis  secondary to healthcare associated pneumonia- meeting sepsis criteria on admission with tachycardia, leukocytosis, and lactic acidosis.  Sepsis and lactic acidosis have resolved. -Stop vancomycin due to negative MRSA PCR -Continue cefepime, can likely transition to p.o. Levaquin tomorrow -Follow-up blood and urine cultures  Acute hypoxic respiratory failure secondary to healthcare associated pneumonia- this has resolved.  Patient is now stable on room air. -Will plan to ambulate with pulse ox prior to discharge -Check procalcitonin  Stage IIIB adenocarcinoma of lung- patient is currently on chemotherapy and will soon be starting radiation as an outpatient. -Oncology following  Chronic normocytic anemia- may be due to anemia of chronic disease versus chemotherapy.  Hemoglobin has dropped, likely dilutional in the setting of IV fluids.  No active bleeding. -Continue to monitor -Check anemia panel -Transfusion threshold <7  Tobacco use -Tobacco cessation counseling performed this admission  All the records are reviewed and case discussed with Care Management/Social Worker. Management plans discussed with the patient, family and they are in agreement.  CODE STATUS: DNR  TOTAL TIME TAKING CARE OF THIS PATIENT: 40 minutes.   More than 50% of the time was spent in counseling/coordination of care: YES  POSSIBLE D/C IN 1-2 DAYS, DEPENDING ON CLINICAL CONDITION.   Berna Spare Mayo M.D on 02/12/2019 at 11:42 AM  Between 7am to 6pm - Pager - [phone]  After 6pm go to [email or web address] - Proofreader  [facility]  Office  [phone]  CC: Primary care physician; Patient, No Pcp Per  Note: This dictation was prepared with Dragon dictation along with smaller phrase technology. Any transcriptional errors that result from this process are unintentional.

## 2019-02-12 NOTE — Progress Notes (Addendum)
Pt was complaining of hiccups. Notify prime. Will continue to monitor.  Update 2336: Dr. Jannifer Franklin states will place order. Will continue to monitor.

## 2019-02-13 LAB — BASIC METABOLIC PANEL
Anion gap: 8 (ref 5–15)
BUN: 24 mg/dL — ABNORMAL HIGH (ref 6–20)
CO2: 25 mmol/L (ref 22–32)
Calcium: 6.6 mg/dL — ABNORMAL LOW (ref 8.9–10.3)
Chloride: 102 mmol/L (ref 98–111)
Creatinine, Ser: 0.88 mg/dL (ref 0.61–1.24)
GFR calc Af Amer: >60 mL/min (ref >60)
GFR calc non Af Amer: >60 mL/min (ref >60)
Glucose, Bld: 95 mg/dL (ref 70–99)
Potassium: 3.6 mmol/L (ref 3.5–5.1)
Sodium: 135 mmol/L (ref 135–145)

## 2019-02-13 LAB — CBC
HCT: 21.7 % — ABNORMAL LOW (ref 39.0–52.0)
Hemoglobin: 6.7 g/dL — ABNORMAL LOW (ref 13.0–17.0)
MCH: 28.6 pg (ref 26.0–34.0)
MCHC: 30.9 g/dL (ref 30.0–36.0)
MCV: 92.7 fL (ref 80.0–100.0)
Platelets: 128 10*3/uL — ABNORMAL LOW (ref 150–400)
RBC: 2.34 MIL/uL — ABNORMAL LOW (ref 4.22–5.81)
RDW: 19.1 % — ABNORMAL HIGH (ref 11.5–15.5)
WBC: 18.8 10*3/uL — ABNORMAL HIGH (ref 4.0–10.5)
nRBC: 0 % (ref 0.0–0.2)

## 2019-02-13 LAB — HEMOGLOBIN AND HEMATOCRIT, BLOOD
HCT: 26 % — ABNORMAL LOW (ref 39.0–52.0)
Hemoglobin: 8.3 g/dL — ABNORMAL LOW (ref 13.0–17.0)

## 2019-02-13 LAB — VITAMIN B12: Vitamin B-12: 717 pg/mL (ref 180–914)

## 2019-02-13 LAB — IRON AND TIBC
Iron: 23 ug/dL — ABNORMAL LOW (ref 45–182)
Saturation Ratios: 14 % — ABNORMAL LOW (ref 17.9–39.5)
TIBC: 163 ug/dL — ABNORMAL LOW (ref 250–450)
UIBC: 140 ug/dL

## 2019-02-13 LAB — URINE CULTURE: Culture: NO GROWTH

## 2019-02-13 LAB — FOLATE: Folate: 3.7 ng/mL — ABNORMAL LOW (ref >5.9)

## 2019-02-13 LAB — ABO/RH: ABO/RH(D): A NEG

## 2019-02-13 LAB — PREPARE RBC (CROSSMATCH)

## 2019-02-13 LAB — PROCALCITONIN: Procalcitonin: 0.3 ng/mL

## 2019-02-13 LAB — FERRITIN: Ferritin: 429 ng/mL — ABNORMAL HIGH (ref 24–336)

## 2019-02-13 MED ORDER — LEVOFLOXACIN 750 MG PO TABS: 750.0000 mg | ORAL_TABLET | Freq: Every day | ORAL | 0 refills | Status: AC

## 2019-02-13 MED ORDER — LEVOFLOXACIN 750 MG PO TABS
750.0000 mg | ORAL_TABLET | Freq: Every day | ORAL | Status: DC
  Administered 2019-02-13: 750 mg via ORAL
  Filled 2019-02-13: qty 1

## 2019-02-13 MED ORDER — FOLIC ACID 1 MG PO TABS: 1.0000 mg | ORAL_TABLET | Freq: Every day | ORAL | 0 refills | Status: AC

## 2019-02-13 MED ORDER — SODIUM CHLORIDE 0.9% IV SOLUTION
Freq: Once | INTRAVENOUS | Status: AC
  Administered 2019-02-13: 30 mL via INTRAVENOUS

## 2019-02-13 MED ORDER — FOLIC ACID 1 MG PO TABS
1.0000 mg | ORAL_TABLET | Freq: Every day | ORAL | Status: DC
  Administered 2019-02-13: 1 mg via ORAL
  Filled 2019-02-13: qty 1

## 2019-02-13 NOTE — TOC Transition Note (Signed)
Transition of Care [facility]) - CM/SW Discharge Note   Patient Details  Name: Alexander [name] MRN: [id number] Date of Birth: 1966-02-05  [facility] of [facility] CM/SW Contact:  Katrina Stack, RN Phone Number: 02/13/2019, 2:43 PM   Clinical Narrative:   Patient with high readmission risk score. Patient currently receiving chemotherapy for lung cancer at [facility].Marland Kitchen Room air. Denies issues that would prevent him from keeping appointments with his oncologist. Denies issues that prevent him from obtaining his medications. He does not have his [place] access card with him so unable to see which medical practice would be his primary. Says at present, Dr RAO is "all I need."           Patient Goals and CMS Choice        Discharge Placement                       Discharge Plan and Services                                     Social Determinants of Health (SDOH) Interventions     Readmission Risk Interventions No flowsheet data found.

## 2019-02-13 NOTE — Progress Notes (Signed)
Initial Nutrition Assessment  DOCUMENTATION CODES:   Underweight  INTERVENTION:  Unable to order supplement as patient with discharge order and ready to go home.  Encouraged to drink high-calorie, high-protein ONS 3 times daily at home. He receives cases of Ensure from cancer center. Discussed choosing high-calorie, high-protein foods/beverages at meals.  NUTRITION DIAGNOSIS:   Increased nutrient needs related to catabolic illness(stage IIIB adenocarcinoma of lung on chemotherapy, COPD) as evidenced by estimated needs.  GOAL:   Patient will meet greater than or equal to 90% of their needs  MONITOR:   PO intake, Supplement acceptance, Labs, Weight trends, I & O's  REASON FOR ASSESSMENT:   Malnutrition Screening Tool    ASSESSMENT:   53 year old male with PMHx of COPD, stage IIIB adenocarcinoma of lung on chemotherapy admitted with sepsis and PNA.   Met with patient in his room but he was already dressed and ready to leave. Discharge order in place. He reports he has a decreased appetite but the further out he gets from chemo it tends to increase. He has met with outpatient RD at cancer center and receives cases of Ensure that he drinks at home. He is also working on eating high-calorie, high-protein foods. He does not have any nutrition questions or concerns at this time. Suspect patient is malnourished but unable to complete NFPE to determine if he meets criteria.   Patient reports his UBW was 178 lbs. Could not see a weight that high in chart so weight loss has likely been over long period of time. He was 136.2 lbs on 03/09/2018. He is now 51 kg (112.4 lbs). He has lost 23.8 lbs (17.5% body weight) over the past 11 months, which is not significant for time frame but is still concerning.  Medications reviewed and include: folic acid 1 mg daily, Levaquin, Megace 400 mg BID, Miralax.  Labs reviewed: BUN 24, Iron 23, TIBC 163, Ferritin 429, Folate 3.7.  NUTRITION - FOCUSED  PHYSICAL EXAM:  Unable to complete as patient was already dressed in clothes ready to leave.  Diet Order:   Diet Order            Diet - low sodium heart healthy        Diet regular Room service appropriate? Yes; Fluid consistency: Thin  Diet effective now             EDUCATION NEEDS:   No education needs have been identified at this time  Skin:  Skin Assessment: Reviewed RN Assessment  Last BM:  02/10/2019 per chart  Height:   Ht Readings from Last 1 Encounters:  02/11/19 _0  (1.753 m)   Weight:   Wt Readings from Last 1 Encounters:  02/13/19 51 kg   Ideal Body Weight:  72.7 kg  BMI:  Body mass index is 16.6 kg/m.  Estimated Nutritional Needs:   Kcal:  1750-2025 (MSJ x 1.3-1.5)  Protein:  75-90 grams (1.5-1.8 grams/kg)  Fluid:  1.7-2 L/day  Willey Blade, MS, RD, LDN Office: [phone] Pager: [phone] After Hours/Weekend Pager: [phone]

## 2019-02-13 NOTE — Plan of Care (Signed)

## 2019-02-13 NOTE — Discharge Instructions (Signed)
Community-Acquired Pneumonia, Adult Pneumonia is an infection of the lungs. It causes swelling in the airways of the lungs. Mucus and fluid may also build up inside the airways. One type of pneumonia can happen while a person is in a hospital. A different type can happen when a person is not in a hospital (community-acquired pneumonia).  What are the causes?  This condition is caused by germs (viruses, bacteria, or fungi). Some types of germs can be passed from one person to another. This can happen when you breathe in droplets from the cough or sneeze of an infected person. What increases the risk? You are more likely to develop this condition if you:  Have a long-term (chronic) disease, such as: ? Chronic obstructive pulmonary disease (COPD). ? Asthma. ? Cystic fibrosis. ? Congestive heart failure. ? Diabetes. ? Kidney disease.  Have HIV.  Have sickle cell disease.  Have had your spleen removed.  Do not take good care of your teeth and mouth (poor dental hygiene).  Have a medical condition that increases the risk of breathing in droplets from your own mouth and nose.  Have a weakened body defense system (immune system).  Are a smoker.  Travel to areas where the germs that cause this illness are common.  Are around certain animals or the places they live. What are the signs or symptoms?  A dry cough.  A wet (productive) cough.  Fever.  Sweating.  Chest pain. This often happens when breathing deeply or coughing.  Fast breathing or trouble breathing.  Shortness of breath.  Shaking chills.  Feeling tired (fatigue).  Muscle aches. How is this treated? Treatment for this condition depends on many things. Most adults can be treated at home. In some cases, treatment must happen in a hospital. Treatment may include:  Medicines given by mouth or through an IV tube.  Being given extra oxygen.  Respiratory therapy. In rare cases, treatment for very bad  pneumonia may include:  Using a machine to help you breathe.  Having a procedure to remove fluid from around your lungs. Follow these instructions at home: Medicines  Take over-the-counter and prescription medicines only as told by your doctor. ? Only take cough medicine if you are losing sleep.  If you were prescribed an antibiotic medicine, take it as told by your doctor. Do not stop taking the antibiotic even if you start to feel better. General instructions   Sleep with your head and neck raised (elevated). You can do this by sleeping in a recliner or by putting a few pillows under your head.  Rest as needed. Get at least 8 hours of sleep each night.  Drink enough water to keep your pee (urine) pale yellow.  Eat a healthy diet that includes plenty of vegetables, fruits, whole grains, low-fat dairy products, and lean protein.  Do not use any products that contain nicotine or tobacco. These include cigarettes, e-cigarettes, and chewing tobacco. If you need help quitting, ask your doctor.  Keep all follow-up visits as told by your doctor. This is important. How is this prevented? A shot (vaccine) can help prevent pneumonia. Shots are often suggested for:  People older than 54 years of age.  People older than 54 years of age who: ? Are having cancer treatment. ? Have long-term (chronic) lung disease. ? Have problems with their body's defense system. You may also prevent pneumonia if you take these actions:  Get the flu (influenza) shot every year.  Go to the dentist  as often as told.  Wash your hands often. If you cannot use soap and water, use hand sanitizer. Contact a doctor if:  You have a fever.  You lose sleep because your cough medicine does not help. Get help right away if:  You are short of breath and it gets worse.  You have more chest pain.  Your sickness gets worse. This is very serious if: ? You are an older adult. ? Your body's defense system is  weak.  You cough up blood. Summary  Pneumonia is an infection of the lungs.  Most adults can be treated at home. Some will need treatment in a hospital.  Drink enough water to keep your pee pale yellow.  Get at least 8 hours of sleep each night. This information is not intended to replace advice given to you by your health care provider. Make sure you discuss any questions you have with your health care provider. Document Released: 10/29/2007 Document Revised: 09/01/2018 Document Reviewed: 01/07/2018 Elsevier Patient Education  [employer]. It was so nice to meet you during this hospitalization!  You came into the hospital with shortness of breath. We found that you had a pneumonia. We treated you with some antibiotics through your IV. I have prescribed levaquin for you to go home with. Please take Levaquin 750mg  daily, starting tomorrow and continuing for 4 total days.  We also found that your folate level was low, so I have prescribed some folic acid tablets- please take 1 daily.  Please make sure you follow-up with the cancer doctor (Dr. Janese Banks) in the next 2-3 days.  Take care, Dr. Brett Albino

## 2019-02-13 NOTE — Progress Notes (Addendum)
Pt hgb at 6.7. Page prime. Awaiting callback. Notify incoming shift. Will continue to monitor.  Update 0724: Dr. Marcille Blanco states will place order. Will continue to monitor.

## 2019-02-13 NOTE — Discharge Summary (Signed)
[facility] [place] NAME: Alexander [name]    MR#:  [id number]  DATE OF BIRTH:  1966-01-02  DATE OF ADMISSION:  02/11/2019   ADMITTING PHYSICIAN: Vaughan Basta, MD  DATE OF DISCHARGE: 02/13/19  PRIMARY CARE PHYSICIAN: Patient, No Pcp Per   ADMISSION DIAGNOSIS:  Dehydration [E86.0] Lactic acidosis [E87.2] Sepsis due to pneumonia ([place]) [J18.9, A41.9] DISCHARGE DIAGNOSIS:  Active Problems:   Acute respiratory failure ([place])   HCAP (healthcare-associated pneumonia)  SECONDARY DIAGNOSIS:   Past Medical History:  Diagnosis Date  . Cancer ([place])   . COPD (chronic obstructive pulmonary disease) ([place])   . Lung mass    HOSPITAL COURSE:   Alexander [name] is a 53 year old male who presented to the ED with worsening cough productive of white sputum, chills, and shortness of breath.  In the ED, he was noted to have a pneumonia on chest x-ray.  He was admitted for further management.  Sepsis secondary to healthcare associated pneumonia- meeting sepsis criteria on admission with tachycardia, leukocytosis, and lactic acidosis.  Sepsis has resolved. 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 -Patient will need to follow-up with oncology as an outpatient -Patient already has an appointment scheduled with radiation oncology tomorrow  Acute on chronic normocytic anemia-  patient's hemoglobin dropped to 6.7.  Likely due to chemotherapy.  No active bleeding. -Given 1 unit PRBC on 9/20 -Anemia panel with low iron and low saturation ratios, but  elevated ferritin (may be due to acute inflammation) -Consider rechecking iron studies as an outpatient -Needs CBC rechecked as an outpatient -Folate was found to be low- folic acid 1 mg daily prescribed  Tobacco use -Tobacco cessation counseling performed this admission  DISCHARGE CONDITIONS:  Healthcare associated pneumonia Stage IIIb adenocarcinoma of the lung Acute on chronic normocytic anemia Tobacco use CONSULTS OBTAINED:  Treatment Team:  Sindy Guadeloupe, MD DRUG ALLERGIES:   Allergies  Allergen Reactions  . Doxycycline Swelling  . Penicillins Swelling    Did it involve swelling of the face/tongue/throat, SOB, or low BP? Unknown Did it involve sudden or severe rash/hives, skin peeling, or any reaction on the inside of your mouth or nose? Unknown Did you need to seek medical attention at a hospital or doctor's office? Unknown When did it last happen?Childhood If all above answers are "NO", may proceed with cephalosporin use.   DISCHARGE MEDICATIONS:   Allergies as of 02/13/2019      Reactions   Doxycycline Swelling   Penicillins Swelling   Did it involve swelling of the face/tongue/throat, SOB, or low BP? Unknown Did it involve sudden or severe rash/hives, skin peeling, or any reaction on the inside of your mouth or nose? Unknown Did you need to seek medical attention at a hospital or doctor's office? Unknown When did it last happen?Childhood If all above answers are "NO", may proceed with cephalosporin use.      Medication List    STOP taking these medications   megestrol 400 MG/10ML suspension Commonly known as: MEGACE     TAKE these medications   Combivent Respimat 20-100 MCG/ACT  Aers respimat Generic drug: Ipratropium-Albuterol Inhale 1 puff into the lungs every 6 (six) hours as needed for wheezing or shortness of breath.   Eliquis 5 MG Tabs tablet Generic drug: apixaban TAKE 1 TABLET BY MOUTH TWICE DAILY What changed: how much to take    folic acid 1 MG tablet Commonly known as: FOLVITE Take 1 tablet (1 mg total) by mouth daily. Start taking on: February 14, 2019   levofloxacin 750 MG tablet Commonly known as: LEVAQUIN Take 1 tablet (750 mg total) by mouth daily. Start taking on: February 14, 2019 What changed:   medication strength  how much to take   lidocaine-prilocaine cream Commonly known as: EMLA Apply to affected area once   Mucinex 600 MG 12 hr tablet Generic drug: guaiFENesin Take 600 mg by mouth 2 (two) times daily as needed.   OLANZapine 10 MG tablet Commonly known as: ZYPREXA Take 1 tablet (10 mg total) by mouth at bedtime.   ondansetron 8 MG tablet Commonly known as: Zofran Take 1 tablet (8 mg total) by mouth 2 (two) times daily as needed for refractory nausea / vomiting. Start on day 3 after carboplatin chemo.   Oxycodone HCl 10 MG Tabs Take 1 tablet (10 mg total) by mouth every 4 (four) hours as needed.   polyethylene glycol 17 g packet Commonly known as: MIRALAX / GLYCOLAX Take 17 g by mouth 2 (two) times a week.   prochlorperazine 10 MG tablet Commonly known as: COMPAZINE Take 1 tablet (10 mg total) by mouth every 6 (six) hours as needed (Nausea or vomiting).        DISCHARGE INSTRUCTIONS:  1.  Follow-up with PCP in 5 days 2.  Follow-up with oncology 2-3 days 3.  Take Levaquin 750 mg daily for 4 more days 4.  Take folic acid 1 mg daily DIET:  Regular diet DISCHARGE CONDITION:  Stable ACTIVITY:  Activity as tolerated OXYGEN:  Home Oxygen: No.  Oxygen Delivery: room air DISCHARGE LOCATION:  home   If you experience worsening of your admission symptoms, develop shortness of breath, life threatening emergency, suicidal or homicidal thoughts you must seek medical attention immediately by calling 911 or calling your MD immediately  if symptoms less severe.  You Must read complete instructions/literature along with all the possible adverse reactions/side effects for all  the Medicines you take and that have been prescribed to you. Take any new Medicines after you have completely understood and accpet all the possible adverse reactions/side effects.   Please note  You were cared for by a hospitalist during your hospital stay. If you have any questions about your discharge medications or the care you received while you were in the hospital after you are discharged, you can call the unit and asked to speak with the hospitalist on call if the hospitalist that took care of you is not available. Once you are discharged, your primary care physician will handle any further medical issues. Please note that NO REFILLS for any discharge medications will be authorized once you are discharged, as it is imperative that you return to your primary care physician (or establish a relationship with a primary care physician if you do not have one) for your aftercare needs so that they can reassess your need for medications and monitor your lab values.    On the day of Discharge:  VITAL SIGNS:  Blood pressure 95/71, pulse 97, temperature 98 F (36.7 C), temperature source Oral, resp. rate 18, height 5\' 9"  (1.753 m),  weight 51 kg, SpO2 95 %. PHYSICAL EXAMINATION:  GENERAL:  53 y.o.-year-old patient sitting up on the edge of the bed with no acute distress. +chronically ill-appearing. EYES: Pupils equal, round, reactive to light and accommodation. No scleral icterus. Extraocular muscles intact.  HEENT: Head atraumatic, normocephalic. Oropharynx and nasopharynx clear.  NECK:  Supple, no jugular venous distention. No thyroid enlargement, no tenderness.  LUNGS: Normal breath sounds bilaterally, no wheezing, rales,rhonchi or crepitation. No use of accessory muscles of respiration.  CARDIOVASCULAR: RRR, S1, S2 normal. No murmurs, rubs, or gallops.  ABDOMEN: Soft, non-tender, non-distended. Bowel sounds present. No organomegaly or mass.  EXTREMITIES: No pedal edema, cyanosis, or clubbing.   NEUROLOGIC: Cranial nerves II through XII are intact. +global weakness. Sensation intact. Gait not checked.  PSYCHIATRIC: The patient is alert and oriented x 3.  SKIN: No obvious rash, lesion, or ulcer.  DATA REVIEW:   CBC Recent Labs  Lab 02/13/19 0414  WBC 18.8*  HGB 6.7*  HCT 21.7*  PLT 128*    Chemistries  Recent Labs  Lab 02/11/19 1220  02/13/19 0414  NA 131*   < > 135  K 4.3   < > 3.6  CL 92*   < > 102  CO2 26   < > 25  GLUCOSE 104*   < > 95  BUN 13   < > 24*  CREATININE 0.88   < > 0.88  CALCIUM 7.2*   < > 6.6*  AST 24  --   --   ALT 8  --   --   ALKPHOS 168*  --   --   BILITOT 1.1  --   --    < > = values in this interval not displayed.     Microbiology Results  Results for orders placed or performed during the hospital encounter of 02/11/19  Blood culture (routine x 2)     Status: None (Preliminary result)   Collection Time: 02/11/19 12:20 PM   Specimen: BLOOD  Result Value Ref Range Status   Specimen Description BLOOD LFA  Final   Special Requests   Final    BOTTLES DRAWN AEROBIC AND ANAEROBIC Blood Culture results may not be optimal due to an excessive volume of blood received in culture bottles   Culture   Final    NO GROWTH 2 DAYS Performed at [facility], [address], [place], [place] [address]    Report Status PENDING  Incomplete  Blood culture (routine x 2)     Status: None (Preliminary result)   Collection Time: 02/11/19 12:20 PM   Specimen: BLOOD  Result Value Ref Range Status   Specimen Description BLOOD RFA  Final   Special Requests   Final    BOTTLES DRAWN AEROBIC AND ANAEROBIC Blood Culture results may not be optimal due to an excessive volume of blood received in culture bottles   Culture   Final    NO GROWTH 2 DAYS Performed at [facility], [address], [place], [place] [address]    Report Status PENDING  Incomplete  SARS CORONAVIRUS 2 (TAT 6-24 HRS) Nasopharyngeal Nasopharyngeal Swab     Status: None    Collection Time: 02/11/19 12:20 PM   Specimen: Nasopharyngeal Swab  Result Value Ref Range Status   SARS Coronavirus 2 NEGATIVE NEGATIVE Final    Comment: (NOTE) SARS-CoV-2 target nucleic acids are NOT DETECTED. The SARS-CoV-2 RNA is generally detectable in upper and lower respiratory specimens during the acute phase of infection.  Negative results do not preclude SARS-CoV-2 infection, do not rule out co-infections with other pathogens, and should not be used as the sole basis for treatment or other patient management decisions. Negative results must be combined with clinical observations, patient history, and epidemiological information. The expected result is Negative. Fact Sheet for Patients: SugarRoll.be Fact Sheet for Healthcare Providers: https://www.woods-mathews.com/ This test is not yet approved or cleared by the [place] FDA and  has been authorized for detection and/or diagnosis of SARS-CoV-2 by FDA under an Emergency Use Authorization (EUA). This EUA will remain  in effect (meaning this test can be used) for the duration of the COVID-19 declaration under Section 56 4(b)(1) of the Act, 21 U.S.C. section 360bbb-3(b)(1), unless the authorization is terminated or revoked sooner. Performed at [facility], [place] [address] [place], [place] [address]   MRSA PCR Screening     Status: None   Collection Time: 02/11/19  4:39 PM   Specimen: Nasopharyngeal  Result Value Ref Range Status   MRSA by PCR NEGATIVE NEGATIVE Final    Comment:        The GeneXpert MRSA Assay (FDA approved for NASAL specimens only), is one component of a comprehensive MRSA colonization surveillance program. It is not intended to diagnose MRSA infection nor to guide or monitor treatment for MRSA infections. Performed at [facility], [address], [place], [place] [address]     RADIOLOGY:  No results found.   Management plans  discussed with the patient, family and they are in agreement.  CODE STATUS: DNR   TOTAL TIME TAKING CARE OF THIS PATIENT: 40 minutes.    Berna Spare Mayo M.D on 02/13/2019 at 10:06 AM  Between 7am to 6pm - Pager - [phone]  After 6pm go to [email or web address] - Proofreader  [facility]  Office  [phone]  CC: Primary care physician; Patient, No Pcp Per   Note: This dictation was prepared with Dragon dictation along with smaller phrase technology. Any transcriptional errors that result from this process are unintentional.

## 2019-02-13 NOTE — Plan of Care (Signed)
Hgb 8.3 post-transfusion.  Pt to be discharged per MD instructions (Hgb > 7).  Discharge instructions provided to pt.  All questions addressed.  Understanding verified through teach back.  Awaiting transportation home via POV.   Problem: Education: Goal: Knowledge of General Education information will improve Description: Including pain rating scale, medication(s)/side effects and non-pharmacologic comfort measures 02/13/2019 1750 by Aubery Lapping, RN Outcome: Completed/Met 02/13/2019 1527 by Aubery Lapping, RN Outcome: Progressing   Problem: Health Behavior/Discharge Planning: Goal: Ability to manage health-related needs will improve 02/13/2019 1750 by Aubery Lapping, RN Outcome: Completed/Met 02/13/2019 1527 by Aubery Lapping, RN Outcome: Progressing   Problem: Clinical Measurements: Goal: Ability to maintain clinical measurements within normal limits will improve 02/13/2019 1750 by Aubery Lapping, RN Outcome: Completed/Met 02/13/2019 1527 by Aubery Lapping, RN Outcome: Progressing Goal: Will remain free from infection 02/13/2019 1750 by Aubery Lapping, RN Outcome: Completed/Met 02/13/2019 1527 by Aubery Lapping, RN Outcome: Progressing Goal: Diagnostic test results will improve 02/13/2019 1750 by Aubery Lapping, RN Outcome: Completed/Met 02/13/2019 1527 by Aubery Lapping, RN Outcome: Progressing Goal: Respiratory complications will improve 02/13/2019 1750 by Aubery Lapping, RN Outcome: Completed/Met 02/13/2019 1527 by Aubery Lapping, RN Outcome: Progressing Goal: Cardiovascular complication will be avoided 02/13/2019 1750 by Aubery Lapping, RN Outcome: Completed/Met 02/13/2019 1527 by Aubery Lapping, RN Outcome: Progressing   Problem: Activity: Goal: Risk for activity intolerance will decrease 02/13/2019 1750 by Aubery Lapping, RN Outcome: Completed/Met 02/13/2019 1527 by Aubery Lapping,  RN Outcome: Progressing   Problem: Nutrition: Goal: Adequate nutrition will be maintained 02/13/2019 1750 by Aubery Lapping, RN Outcome: Completed/Met 02/13/2019 1527 by Aubery Lapping, RN Outcome: Progressing   Problem: Coping: Goal: Level of anxiety will decrease 02/13/2019 1750 by Aubery Lapping, RN Outcome: Completed/Met 02/13/2019 1527 by Aubery Lapping, RN Outcome: Progressing   Problem: Elimination: Goal: Will not experience complications related to bowel motility 02/13/2019 1750 by Aubery Lapping, RN Outcome: Completed/Met 02/13/2019 1527 by Aubery Lapping, RN Outcome: Progressing Goal: Will not experience complications related to urinary retention 02/13/2019 1750 by Aubery Lapping, RN Outcome: Completed/Met 02/13/2019 1527 by Aubery Lapping, RN Outcome: Progressing   Problem: Pain Managment: Goal: General experience of comfort will improve 02/13/2019 1750 by Aubery Lapping, RN Outcome: Completed/Met 02/13/2019 1527 by Aubery Lapping, RN Outcome: Progressing   Problem: Safety: Goal: Ability to remain free from injury will improve 02/13/2019 1750 by Aubery Lapping, RN Outcome: Completed/Met 02/13/2019 1527 by Aubery Lapping, RN Outcome: Progressing   Problem: Skin Integrity: Goal: Risk for impaired skin integrity will decrease 02/13/2019 1750 by Aubery Lapping, RN Outcome: Completed/Met 02/13/2019 1527 by Aubery Lapping, RN Outcome: Progressing   Problem: Increased Nutrient Needs (NI-5.1) Goal: Food and/or nutrient delivery Description: Individualized approach for food/nutrient provision. Outcome: Completed/Met

## 2019-02-13 NOTE — Progress Notes (Signed)
Blood transfusion in progress.  NAD noted.

## 2019-02-13 NOTE — Plan of Care (Signed)

## 2019-02-14 ENCOUNTER — Other Ambulatory Visit: Payer: Self-pay | Admitting: *Deleted

## 2019-02-14 ENCOUNTER — Inpatient Hospital Stay: Payer: Medicaid Other

## 2019-02-14 ENCOUNTER — Telehealth: Payer: Self-pay | Admitting: Hospice and Palliative Medicine

## 2019-02-14 LAB — TYPE AND SCREEN
ABO/RH(D): A NEG
Antibody Screen: NEGATIVE
Unit division: 0

## 2019-02-14 LAB — BPAM RBC
Blood Product Expiration Date: 202009282359
ISSUE DATE / TIME: 202009201313
Unit Type and Rh: 600

## 2019-02-14 MED ORDER — MORPHINE SULFATE ER 15 MG PO TBCR: 15.0000 mg | EXTENDED_RELEASE_TABLET | Freq: Two times a day (BID) | ORAL | 0 refills | Status: DC

## 2019-02-14 NOTE — Telephone Encounter (Signed)
Ms Farrel Conners called stating that patient is requesting stronger pain medicine, perhaps the MS that was discussed on prior visit. Please advise

## 2019-02-14 NOTE — Progress Notes (Signed)
Nutrition Follow-up:  Patient with stage IV adenocarcinoma of lung.  Current plan is to continue alimta and palliative radiation.  Noted hospital admission and discharge for pneumonia.  Patient followed by Palliative care NP.   Spoke with patient via phone.  Patient reports that he is nibbling on food items and drinking some ensure/boost shakes since discharge on 9/20.  Patient reports that he knows that the end is coming near.  Reports that his ex-girlfriend has come back and helping him out.  Reports that he is good with everything.    Medications: reviewed  Labs: reviewed  Anthropometrics:   Weight 112 lb on 9/20 ( hospital weight)  Weight on 8/11 109 lb   NUTRITION DIAGNOSIS: Inadequate oral intake continues   INTERVENTION:  Offered counseling services at [facility] and patient denied at this time.   Patient planning to come to radiation appointment tomorrow and RD will leave oral nutrition supplement samples for him to pick up.  Patient verbalized understanding.  Discussed case with Palliative NP. RD available as needed    MONITORING, EVALUATION, GOAL: intake, wt trends   NEXT VISIT: as needed  Jrake Rodriquez B. Zenia Resides, [place], [place] Registered Dietitian [phone] (pager)

## 2019-02-14 NOTE — Telephone Encounter (Signed)
Will start morphine sr 15 mg bid

## 2019-02-14 NOTE — Telephone Encounter (Signed)
Had a lengthy conversation with patient's sister-in-law.  She says that patient has been doing poorly since returning home.  He remains profoundly weak and has been sleeping an estimated 20 hours a day.  Pain has been challenging to manage with PRN oxycodone.  Dr. Janese Banks started MS Contin today.  Would also consider trial of dexamethasone and continue adjustment of opioids as needed.  Sister-in-law says that patient has been talking about stopping all treatment and just focusing on his comfort.  She is unsure if he even wants XRT at this point.  Patient is scheduled for XRT tomorrow and I will add a palliative care visit to help address symptoms and goals.  Sister-in-law would like a DNR order signed.  She would also like help with completing advanced directives if possible.  We will see if the chaplain can assist with noted arising documents while he is here.  We will also consider initiation of an antidepressant.

## 2019-02-15 ENCOUNTER — Encounter: Payer: Self-pay | Admitting: Oncology

## 2019-02-15 ENCOUNTER — Other Ambulatory Visit: Payer: Self-pay

## 2019-02-15 ENCOUNTER — Inpatient Hospital Stay (HOSPITAL_BASED_OUTPATIENT_CLINIC_OR_DEPARTMENT_OTHER): Payer: Medicaid Other | Admitting: Hospice and Palliative Medicine

## 2019-02-15 ENCOUNTER — Ambulatory Visit: Payer: Medicaid Other

## 2019-02-15 VITALS — BP 122/82 | HR 112 | Temp 98.6°F | Resp 18 | Wt 114.2 lb

## 2019-02-15 DIAGNOSIS — Z5112 Encounter for antineoplastic immunotherapy: Secondary | ICD-10-CM | POA: Diagnosis not present

## 2019-02-15 DIAGNOSIS — C3491 Malignant neoplasm of unspecified part of right bronchus or lung: Secondary | ICD-10-CM

## 2019-02-15 DIAGNOSIS — C3411 Malignant neoplasm of upper lobe, right bronchus or lung: Secondary | ICD-10-CM | POA: Diagnosis present

## 2019-02-15 DIAGNOSIS — Z7901 Long term (current) use of anticoagulants: Secondary | ICD-10-CM | POA: Diagnosis not present

## 2019-02-15 DIAGNOSIS — Z79899 Other long term (current) drug therapy: Secondary | ICD-10-CM | POA: Diagnosis not present

## 2019-02-15 DIAGNOSIS — C7951 Secondary malignant neoplasm of bone: Secondary | ICD-10-CM | POA: Diagnosis not present

## 2019-02-15 DIAGNOSIS — Z515 Encounter for palliative care: Secondary | ICD-10-CM | POA: Diagnosis not present

## 2019-02-15 DIAGNOSIS — Z923 Personal history of irradiation: Secondary | ICD-10-CM | POA: Diagnosis not present

## 2019-02-15 DIAGNOSIS — Z7189 Other specified counseling: Secondary | ICD-10-CM

## 2019-02-15 DIAGNOSIS — F17210 Nicotine dependence, cigarettes, uncomplicated: Secondary | ICD-10-CM | POA: Diagnosis not present

## 2019-02-15 DIAGNOSIS — G893 Neoplasm related pain (acute) (chronic): Secondary | ICD-10-CM | POA: Diagnosis not present

## 2019-02-15 DIAGNOSIS — Z66 Do not resuscitate: Secondary | ICD-10-CM | POA: Diagnosis not present

## 2019-02-15 DIAGNOSIS — D61810 Antineoplastic chemotherapy induced pancytopenia: Secondary | ICD-10-CM | POA: Diagnosis not present

## 2019-02-15 DIAGNOSIS — J449 Chronic obstructive pulmonary disease, unspecified: Secondary | ICD-10-CM | POA: Diagnosis not present

## 2019-02-15 MED ORDER — DEXAMETHASONE 1 MG PO TABS: 1.0000 mg | ORAL_TABLET | Freq: Two times a day (BID) | ORAL | 0 refills | Status: AC

## 2019-02-15 NOTE — Progress Notes (Signed)
[place]  [phone] [phone]   Name: Alexander [name] Date: 02/15/2019 MRN: [id number]  DOB: [birth year]  Patient Care Team: Sindy Guadeloupe, MD as PCP - General (Oncology) Telford Nab, RN as Registered Nurse Willy Pinkerton, Kirt Boys, NP as Nurse Practitioner (Hospice and Palliative Medicine)    REASON FOR CONSULTATION: Palliative Care consult requested for this 53 y.o. 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  He was recently hospitalized 02/11/2019-02/13/2019 with pneumonia.  Most recent scans in August 2020 showed progression of the disease in the left lung with persistent acetabular metastases.  Patient was referred to palliative care to help address goals and manage ongoing symptoms.   SOCIAL HISTORY:     reports that he has been smoking. He has been smoking about 0.25 packs per day. He has never used smokeless tobacco. He reports previous alcohol use. He reports previous drug use. Drug: Marijuana.   Patient is not married.  He has no children.  Both parents are deceased.  He has a stepbrother.  Patient lives at home alone.  He has support of several friends who are involved in his care.  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  ADVANCE DIRECTIVES:  Does not have  CODE STATUS: DNR (MOST form completed 02/15/19)  PAST MEDICAL HISTORY: Past Medical History:  Diagnosis Date   Cancer ([place])    COPD (chronic obstructive pulmonary disease) ([place])    Lung mass     PAST SURGICAL HISTORY:  Past Surgical History:  Procedure Laterality Date   ELECTROMAGNETIC NAVIGATION BROCHOSCOPY Left 10/13/2018   Procedure: ELECTROMAGNETIC NAVIGATION BRONCHOSCOPY - LEFT;  Surgeon: Tyler Pita, MD;  Location: [facility]  ORS;  Service: Cardiopulmonary;  Laterality: Left;   ENDOBRONCHIAL ULTRASOUND N/A 03/11/2018   Procedure: ENDOBRONCHIAL ULTRASOUND;  Surgeon: Tyler Pita, MD;  Location: [facility] ORS;  Service: Cardiopulmonary;  Laterality: N/A;   ENDOBRONCHIAL ULTRASOUND Left 10/13/2018   Procedure: ENDOBRONCHIAL ULTRASOUND - LEFT;  Surgeon: Tyler Pita, MD;  Location: [facility] ORS;  Service: Cardiopulmonary;  Laterality: Left;   none     PORTA CATH INSERTION N/A 03/12/2018   Procedure: PORTA CATH INSERTION, thigh;  Surgeon: Katha Cabal, MD;  Location: [place] [facility];  Service: Cardiovascular;  Laterality: N/A;   SUBXYPHOID PERICARDIAL WINDOW N/A 06/30/2018   Procedure: SUBXYPHOID PERICARDIAL WINDOW;  Surgeon: [name], MD;  Location: [place];  Service: Open Heart Surgery;  Laterality: N/A;   TEE WITHOUT CARDIOVERSION N/A 06/30/2018   Procedure: TRANSESOPHAGEAL ECHOCARDIOGRAM (TEE);  Surgeon: [name], MD;  Location: [place];  Service: Open Heart Surgery;  Laterality: N/A;    HEMATOLOGY/ONCOLOGY HISTORY:  Oncology History  Lung cancer ([place])  03/16/2018 Initial Diagnosis   Lung cancer ([place])   03/16/2018 Cancer Staging   Staging form: Lung, AJCC 8th Edition - Clinical stage from 03/16/2018: Stage IIIB (cT3, cN2, cM0) - Signed by Sindy Guadeloupe, MD on 03/16/2018   03/17/2018 - 06/09/2018 Chemotherapy   The patient had palonosetron (ALOXI) injection 0.25 mg, 0.25 mg, Intravenous,  Once, 4 of 4 cycles Administration: 0.25 mg (03/17/2018), 0.25 mg (04/29/2018), 0.25 mg (05/20/2018), 0.25 mg (04/08/2018) PEMEtrexed (ALIMTA) 900 mg in sodium chloride 0.9 % 100 mL chemo infusion, 510 mg/m2 = 875 mg, Intravenous,  Once, 4 of 4 cycles  Administration: 900 mg (03/17/2018), 900 mg (04/29/2018), 900 mg (05/20/2018), 900 mg (04/08/2018) CISplatin (PLATINOL) 131 mg in sodium chloride 0.9 % 500 mL chemo infusion, 75 mg/m2 = 131 mg, Intravenous,  Once, 4 of 4 cycles Administration: 131 mg  (03/17/2018), 131 mg (04/29/2018), 131 mg (05/20/2018), 131 mg (04/08/2018) fosaprepitant (EMEND) 150 mg, dexamethasone (DECADRON) 12 mg in sodium chloride 0.9 % 145 mL IVPB, , Intravenous,  Once, 4 of 4 cycles Administration:  (03/17/2018),  (04/29/2018),  (05/20/2018),  (04/08/2018)  for chemotherapy treatment.    06/17/2018 - 06/30/2018 Chemotherapy   The patient had durvalumab (IMFINZI) 620 mg in sodium chloride 0.9 % 100 mL chemo infusion, 580 mg, Intravenous,  Once, 1 of 6 cycles  for chemotherapy treatment.    11/02/2018 -  Chemotherapy   The patient had palonosetron (ALOXI) injection 0.25 mg, 0.25 mg, Intravenous,  Once, 5 of 6 cycles Administration: 0.25 mg (11/02/2018), 0.25 mg (11/23/2018), 0.25 mg (12/14/2018), 0.25 mg (01/04/2019) pegfilgrastim (NEULASTA ONPRO KIT) injection 6 mg, 6 mg, Subcutaneous, Once, 4 of 4 cycles Administration: 6 mg (11/02/2018), 6 mg (11/23/2018), 6 mg (12/14/2018), 6 mg (01/04/2019) bevacizumab (AVASTIN) 800 mg in sodium chloride 0.9 % 100 mL chemo infusion, 13.4 mg/kg = 900 mg, Intravenous,  Once, 3 of 3 cycles Administration: 800 mg (11/02/2018), 800 mg (11/23/2018), 800 mg (12/14/2018) CARBOplatin (PARAPLATIN) 510 mg in sodium chloride 0.9 % 250 mL chemo infusion, 510 mg (133.6 % of original dose 379.5 mg), Intravenous,  Once, 4 of 4 cycles Dose modification:   (original dose 379.5 mg, Cycle 1) Administration: 510 mg (11/02/2018), 430 mg (11/23/2018), 500 mg (12/14/2018) PACLitaxel (TAXOL) 300 mg in sodium chloride 0.9 % 250 mL chemo infusion (> 26m/m2), 175 mg/m2 = 300 mg (100 % of original dose 175 mg/m2), Intravenous,  Once, 4 of 4 cycles Dose modification: 175 mg/m2 (original dose 175 mg/m2, Cycle 1, Reason: Other (see comments), Comment: baseline anemia) Administration: 300 mg (11/02/2018), 300 mg (11/23/2018), 300 mg (12/14/2018), 300 mg (01/04/2019) Bevacizumab-bvzr 800 mg in sodium chloride 0.9 % 100 mL chemo infusion, , Intravenous, Once, 2 of 3 cycles Administration:   (02/04/2019) fosaprepitant (EMEND) 150 mg, dexamethasone (DECADRON) 12 mg in sodium chloride 0.9 % 145 mL IVPB, , Intravenous,  Once, 5 of 6 cycles Administration:  (11/02/2018),  (11/23/2018),  (12/14/2018),  (01/04/2019)  for chemotherapy treatment.      ALLERGIES:  is allergic to doxycycline and penicillins.  MEDICATIONS:  Current Outpatient Medications  Medication Sig Dispense Refill   ELIQUIS 5 MG TABS tablet TAKE 1 TABLET BY MOUTH TWICE DAILY (Patient taking differently: Take 5 mg by mouth 2 (two) times daily. ) 1694tablet 3   folic acid (FOLVITE) 1 MG tablet Take 1 tablet (1 mg total) by mouth daily. 30 tablet 0   guaiFENesin (MUCINEX) 600 MG 12 hr tablet Take 600 mg by mouth 2 (two) times daily as needed.     Ipratropium-Albuterol (COMBIVENT RESPIMAT) 20-100 MCG/ACT AERS respimat Inhale 1 puff into the lungs every 6 (six) hours as needed for wheezing or shortness of breath. 4 g 2   levofloxacin (LEVAQUIN) 750 MG tablet Take 1 tablet (750 mg total) by mouth daily. 4 tablet 0   lidocaine-prilocaine (EMLA) cream Apply to affected area once 30 g 3   morphine (MS CONTIN) 15 MG 12 hr tablet Take 1 tablet (15 mg total) by mouth every 12 (twelve) hours. 60 tablet 0   OLANZapine (ZYPREXA) 10 MG tablet Take 1 tablet (10 mg total) by mouth  at bedtime. 30 tablet 0   ondansetron (ZOFRAN) 8 MG tablet Take 1 tablet (8 mg total) by mouth 2 (two) times daily as needed for refractory nausea / vomiting. Start on day 3 after carboplatin chemo. 30 tablet 1   Oxycodone HCl 10 MG TABS Take 1 tablet (10 mg total) by mouth every 4 (four) hours as needed. 120 tablet 0   polyethylene glycol (MIRALAX / GLYCOLAX) 17 g packet Take 17 g by mouth 2 (two) times a week.     prochlorperazine (COMPAZINE) 10 MG tablet Take 1 tablet (10 mg total) by mouth every 6 (six) hours as needed (Nausea or vomiting). 30 tablet 1   No current facility-administered medications for this visit.    Facility-Administered  Medications Ordered in Other Visits  Medication Dose Route Frequency Provider Last Rate Last Dose   sodium chloride flush (NS) 0.9 % injection 10 mL  10 mL Intravenous Once Sindy Guadeloupe, MD        VITAL SIGNS: BP 122/82    Pulse (!) 112    Temp 98.6 F (37 C) (Tympanic)    Resp 18    Wt 114 lb 3.2 oz (51.8 kg)    SpO2 94% Comment: RA   BMI 16.86 kg/m  Filed Weights   02/15/19 1040  Weight: 114 lb 3.2 oz (51.8 kg)    Estimated body mass index is 16.86 kg/m as calculated from the following:   Height as of 02/11/19: _0  (1.753 m).   Weight as of this encounter: 114 lb 3.2 oz (51.8 kg).  LABS: CBC:    Component Value Date/Time   WBC 18.8 (H) 02/13/2019 0414   HGB 8.3 (L) 02/13/2019 1727   HCT 26.0 (L) 02/13/2019 1727   PLT 128 (L) 02/13/2019 0414   MCV 92.7 02/13/2019 0414   NEUTROABS 21.0 (H) 02/11/2019 1220   LYMPHSABS 0.7 02/11/2019 1220   MONOABS 0.8 02/11/2019 1220   EOSABS 0.0 02/11/2019 1220   BASOSABS 0.0 02/11/2019 1220   Comprehensive Metabolic Panel:    Component Value Date/Time   NA 135 02/13/2019 0414   K 3.6 02/13/2019 0414   CL 102 02/13/2019 0414   CO2 25 02/13/2019 0414   BUN 24 (H) 02/13/2019 0414   CREATININE 0.88 02/13/2019 0414   GLUCOSE 95 02/13/2019 0414   CALCIUM 6.6 (L) 02/13/2019 0414   AST 24 02/11/2019 1220   ALT 8 02/11/2019 1220   ALKPHOS 168 (H) 02/11/2019 1220   BILITOT 1.1 02/11/2019 1220   PROT 7.7 02/11/2019 1220   ALBUMIN 2.5 (L) 02/11/2019 1220    RADIOGRAPHIC STUDIES: Dg Chest Portable 1 View  Result Date: 02/11/2019 CLINICAL DATA:  Cough and shortness-of-breath. History of lung cancer. EXAM: PORTABLE CHEST 1 VIEW COMPARISON:  10/13/2018, chest CT 01/11/2019 FINDINGS: Lungs are adequately inflated with hazy consolidation over the left midlung with patchy airspace density over the right mid to upper lung. The right midlung opacification was present on the previous CT scan. No effusion. Cardiomediastinal silhouette and  remainder of the exam is unchanged. IMPRESSION: New hazy airspace consolidation over the left midlung suggesting infection. Mild patchy airspace opacification over the right mid to upper lung which was present on the recent CT and likely represents infectious/inflammatory process, although metastatic disease is possible. Electronically Signed   By: Marin Olp M.D.   On: 02/11/2019 13:18    PERFORMANCE STATUS (ECOG) : 2 - Symptomatic, <50% confined to bed  Review of Systems Unless otherwise noted, a complete review  of systems is negative.  Physical Exam General: NAD, frail appearing, thin Pulmonary: Unlabored, end expiratory wheezing, dry cough Cardiac: Regular rate and rhythm Abdominal: Tympanic, slightly distended, regular bowel sounds Extremities: 2+ lower extremity edema, no joint deformities Skin: no rashes Neurological: Weakness but otherwise nonfocal  IMPRESSION: Patient was an add-on today to the clinic at family's request.  Since discharging from the hospital 2 days ago and has felt extremely fatigued with poor oral intake.  Family say that he is sleeping much of the day and his oral intake has been extremely minimal.  Patient asked to have a frank conversation regarding his end-of-life.  He states repeatedly that his only desire at this point is to be comfortable.  He is not interested in any life prolonging treatment including radiation or further chemotherapy.  He says he just wants to focus on symptom management, comfort, and peace.  He would be interested in hospice involvement at home.  Case discussed with Dr. Janese Banks, who also saw him today.  A verbal order for hospice was called into [facility].   I called and spoke with patient's sister-in-law and updated her on the visit.  We will start a trial of steroids to see if that improves his pain, fatigue, and appetite.  Patient denies depressive symptoms.  He says he is "content".  He also denies SI.  We discussed CODE  STATUS.  Patient would not want resuscitation at end-of-life.  He is not interested in future hospitalizations, IV fluids, or antibiotics.   I completed a MOST form today. The patient and family outlined their wishes for the following treatment decisions:  Cardiopulmonary Resuscitation: Do Not Attempt Resuscitation (DNR/No CPR)  Medical Interventions: Comfort Measures: Keep clean, warm, and dry. Use medication by any route, positioning, wound care, and other measures to relieve pain and suffering. Use oxygen, suction and manual treatment of airway obstruction as needed for comfort. Do not transfer to the hospital unless comfort needs cannot be met in current location.  Antibiotics: No antibiotics (use other measures to relieve symptoms)  IV Fluids: No IV fluids (provide other measures to ensure comfort)  Feeding Tube: No feeding tube     PLAN: -Comfort care/hospice at home -Continue oxycodone as needed -MS Contin was started yesterday per Dr. Janese Banks -Trial of dexamethasone 1 mg twice daily x7 days -Prophylactic bowel regimen -MOST Form completed/DNR -RTC as needed   Patient expressed understanding and was in agreement with this plan. He also understands that He can call the clinic at any time with any questions, concerns, or complaints.     Time Total: 30 minutes  Visit consisted of counseling and education dealing with the complex and emotionally intense issues of symptom management and palliative care in the setting of serious and potentially life-threatening illness.Greater than 50%  of this time was spent counseling and coordinating care related to the above assessment and plan.  Signed by: Altha Harm, PhD, NP-C [phone] (Work Cell)

## 2019-02-15 NOTE — Progress Notes (Signed)
Pt weak feeling, legs are very weak, not eating or drinking very much. Has pain raitng 5 in stomach. Wants info where he stands with his cancer dx and how long he has to live.

## 2019-02-16 ENCOUNTER — Ambulatory Visit: Payer: Medicaid Other

## 2019-02-16 LAB — CULTURE, BLOOD (ROUTINE X 2)
Culture: NO GROWTH
Culture: NO GROWTH

## 2019-02-17 ENCOUNTER — Ambulatory Visit: Payer: Medicaid Other

## 2019-02-18 ENCOUNTER — Ambulatory Visit: Payer: Medicaid Other

## 2019-02-21 ENCOUNTER — Ambulatory Visit: Payer: Medicaid Other

## 2019-02-21 ENCOUNTER — Encounter: Payer: Self-pay | Admitting: *Deleted

## 2019-02-22 ENCOUNTER — Ambulatory Visit: Payer: Medicaid Other

## 2019-02-23 ENCOUNTER — Ambulatory Visit: Payer: Medicaid Other

## 2019-02-24 ENCOUNTER — Ambulatory Visit: Payer: Medicaid Other

## 2019-02-25 ENCOUNTER — Ambulatory Visit: Payer: Medicaid Other

## 2019-02-25 ENCOUNTER — Ambulatory Visit: Payer: Medicaid Other | Admitting: Hospice and Palliative Medicine

## 2019-02-25 ENCOUNTER — Other Ambulatory Visit: Payer: Medicaid Other

## 2019-02-25 ENCOUNTER — Ambulatory Visit: Payer: Medicaid Other | Admitting: Oncology

## 2019-02-28 ENCOUNTER — Ambulatory Visit: Payer: Medicaid Other

## 2019-02-28 ENCOUNTER — Other Ambulatory Visit: Payer: Self-pay | Admitting: *Deleted

## 2019-02-28 MED ORDER — OXYCODONE HCL 10 MG PO TABS: 10.0000 mg | ORAL_TABLET | ORAL | 0 refills | Status: DC | PRN

## 2019-03-01 ENCOUNTER — Ambulatory Visit: Payer: Medicaid Other

## 2019-03-02 ENCOUNTER — Ambulatory Visit: Payer: Medicaid Other

## 2019-03-03 ENCOUNTER — Ambulatory Visit: Payer: Medicaid Other

## 2019-03-04 ENCOUNTER — Ambulatory Visit: Payer: Medicaid Other

## 2019-03-07 ENCOUNTER — Other Ambulatory Visit: Payer: Self-pay | Admitting: *Deleted

## 2019-03-07 ENCOUNTER — Ambulatory Visit: Payer: Medicaid Other

## 2019-03-07 MED ORDER — MORPHINE SULFATE ER 30 MG PO TBCR: 30.0000 mg | EXTENDED_RELEASE_TABLET | Freq: Two times a day (BID) | ORAL | 0 refills | Status: DC

## 2019-03-08 ENCOUNTER — Ambulatory Visit: Payer: Medicaid Other

## 2019-03-16 ENCOUNTER — Telehealth: Payer: Self-pay | Admitting: *Deleted

## 2019-03-16 NOTE — Telephone Encounter (Signed)
Alexander [name] called reporting that t is not sleeping well at night due to having to get up and take Oxycodone for pain. She requests an increase in his evening MS ER to 45 mg or what ever the physician feels is appropriate for his pain. Please advise

## 2019-03-16 NOTE — Telephone Encounter (Signed)
Message left for hospice RN.

## 2019-03-17 ENCOUNTER — Other Ambulatory Visit: Payer: Self-pay | Admitting: Hospice and Palliative Medicine

## 2019-03-17 MED ORDER — MORPHINE SULFATE ER 30 MG PO TBCR: 30.0000 mg | EXTENDED_RELEASE_TABLET | Freq: Three times a day (TID) | ORAL | 0 refills | Status: AC

## 2019-03-17 MED ORDER — OXYCODONE HCL 10 MG PO TABS: 10.0000 mg | ORAL_TABLET | ORAL | 0 refills | Status: AC | PRN

## 2019-03-17 NOTE — Progress Notes (Addendum)
Spoke with hospice nurse.  Patient has had poorly controlled pain that sounds like is having end dose failure of the MS Contin.  He is still requiring greater than 60 mg of oxycodone daily for breakthrough pain.  He is also waking in the middle the night to take oxycodone.  Will increase MS Contin to 30 mg every 8 hours and continue oxycodone 10 mg every 4 hours as needed for breakthrough pain.  Refill sent for oxycodone.

## 2019-03-17 NOTE — Addendum Note (Signed)
Addended by: Altha Harm R on: 03/17/2019 11:12 AM   Modules accepted: Orders

## 2019-03-23 ENCOUNTER — Telehealth: Payer: Self-pay | Admitting: Hospice and Palliative Medicine

## 2019-03-23 ENCOUNTER — Other Ambulatory Visit: Payer: Self-pay | Admitting: Hospice and Palliative Medicine

## 2019-03-23 MED ORDER — SODIUM CHLORIDE 0.9 % IV SOLN: INTRAVENOUS | 0 refills | Status: AC

## 2019-03-23 MED ORDER — MORPHINE SULFATE (CONCENTRATE) 10 MG /0.5 ML PO SOLN: 10.0000 mg | ORAL | 0 refills | Status: AC | PRN

## 2019-03-23 NOTE — Telephone Encounter (Signed)
I was notified by hospice nurse, Devana, patient has had worsening pain.  It sounds like he is in active stages of dying.  Family has been administering Haldol, lorazepam, and morphine up to hourly dosing.  Discussed options with nurse.  We will try increasing dose of morphine elixir to 10 to 20 mg every 1-2 hours as needed for pain.  I have asked the nurse to call me back later today.  If increased dosing of oral medications is not effective, would likely initiate continuous morphine infusion.  I have also suggested consideration of residential hospice facility.  This has been previously discussed with family and they wanted to keep him at home.

## 2019-03-23 NOTE — Progress Notes (Signed)
Spoke again with patient's hospice nurse.  Family are dosing hourly with morphine elixir.  They are unable to sleep at night.  Symptoms sound poorly controlled despite frequent as needed oral dosing.  Will proceed with starting continuous morphine infusion via port.  Case and plan discussed with Dr. Janese Banks.

## 2019-03-27 DEATH — deceased

## 2019-08-12 IMAGING — CT CT ABD-PELV W/ CM
2 of 5 series · 11 of 36 positions shown, 13 images · IV contrast (iopamidol)
Comparison: 03/09/2018 chest abdomen and pelvic CTs. 03/18/2018
PET.

CLINICAL DATA: Lung cancer diagnosed in [REDACTED]. Recently quit
smoking.

EXAM:
CT CHEST, ABDOMEN, AND PELVIS WITH CONTRAST
TECHNIQUE: Multidetector CT imaging of the chest, abdomen and pelvis was
performed following the standard protocol during bolus
administration of intravenous contrast.
CONTRAST:  80mL [id number] IOPAMIDOL ([id number]) INJECTION 61%

[Series 2: axials cap · axial · 0.65mm/px · z∈[-1691,-1146]mm · 8 of 137 slices shown, 10 images]
[im 14/137  mediastinal]
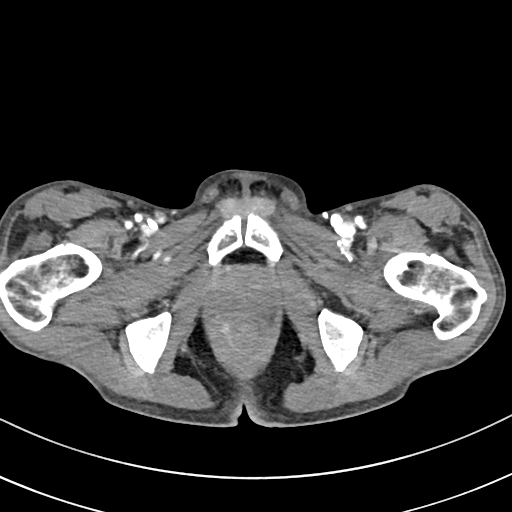
[im 14/137  lung]
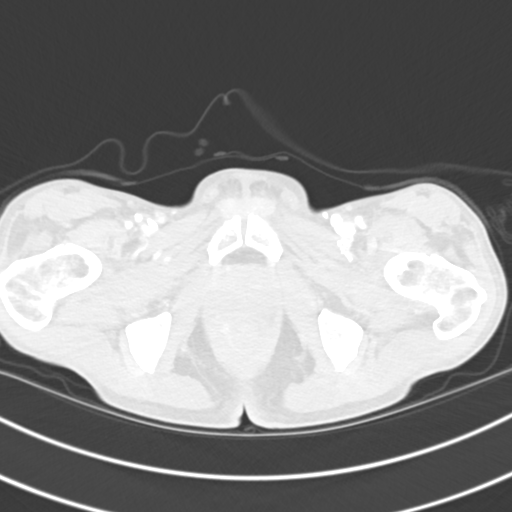
[im 28/137  lung]
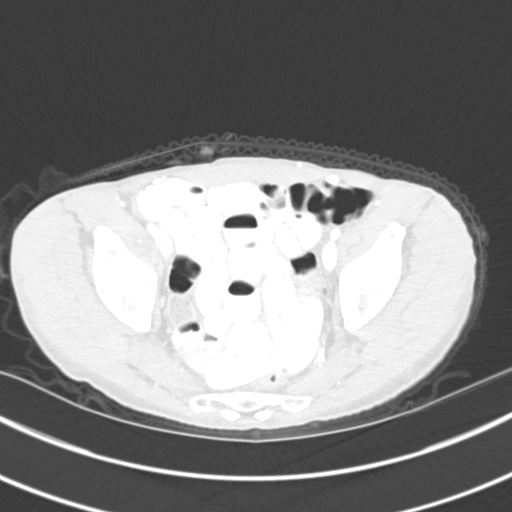
[im 41/137  lung]
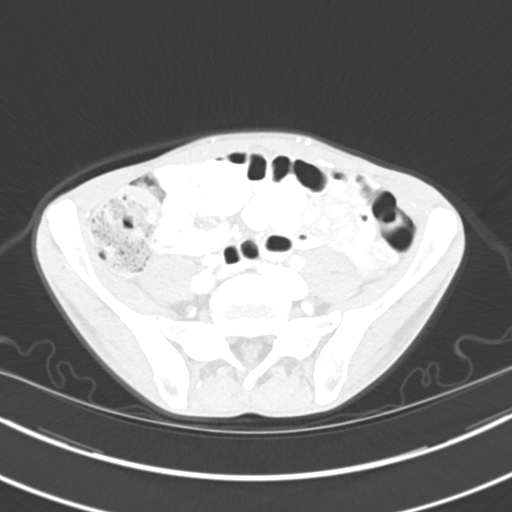
[im 55/137  lung]
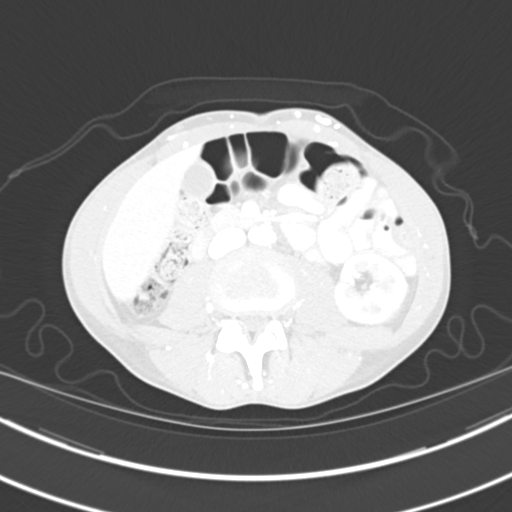
[im 82/137  mediastinal]
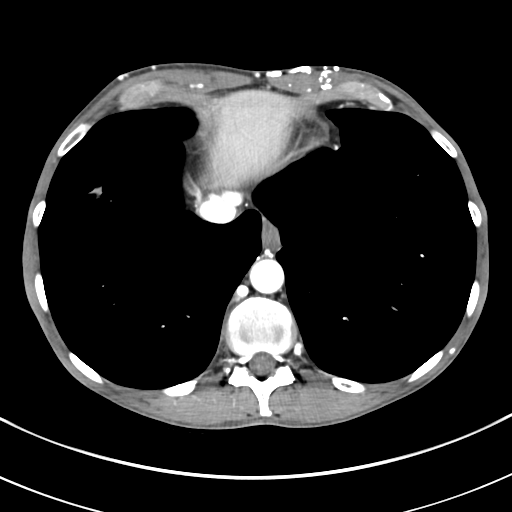
[im 82/137  lung]
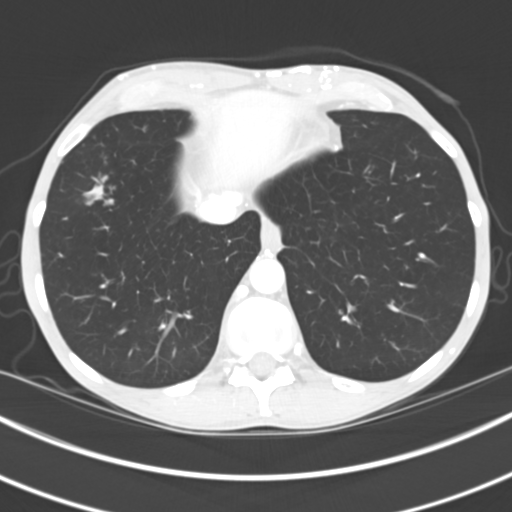
[im 96/137  lung]
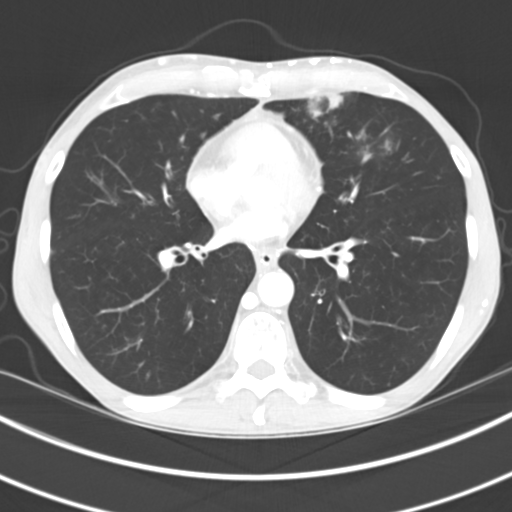
[im 109/137  lung]
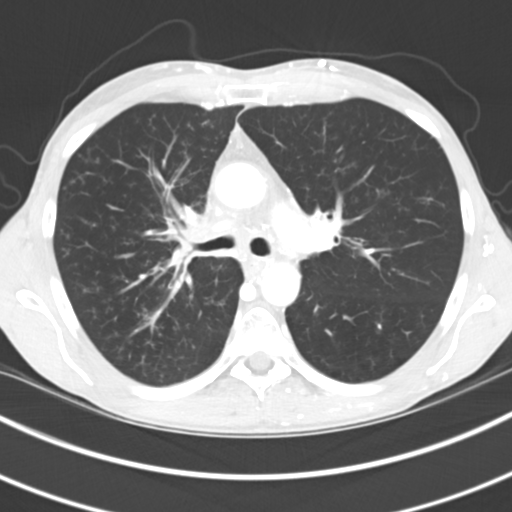
[im 123/137  lung]
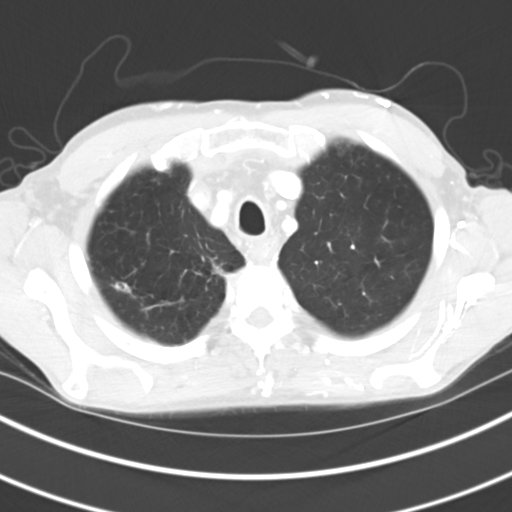

[Series 4: coronals cap · coronal · 0.65mm/px · 3 of 130 slices shown]
[im 26/130  lung]
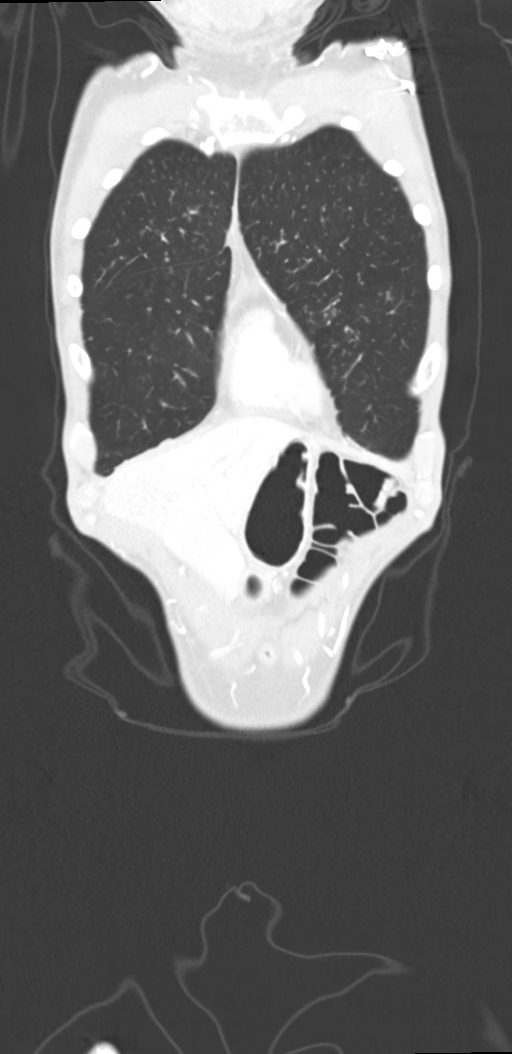
[im 52/130  lung]
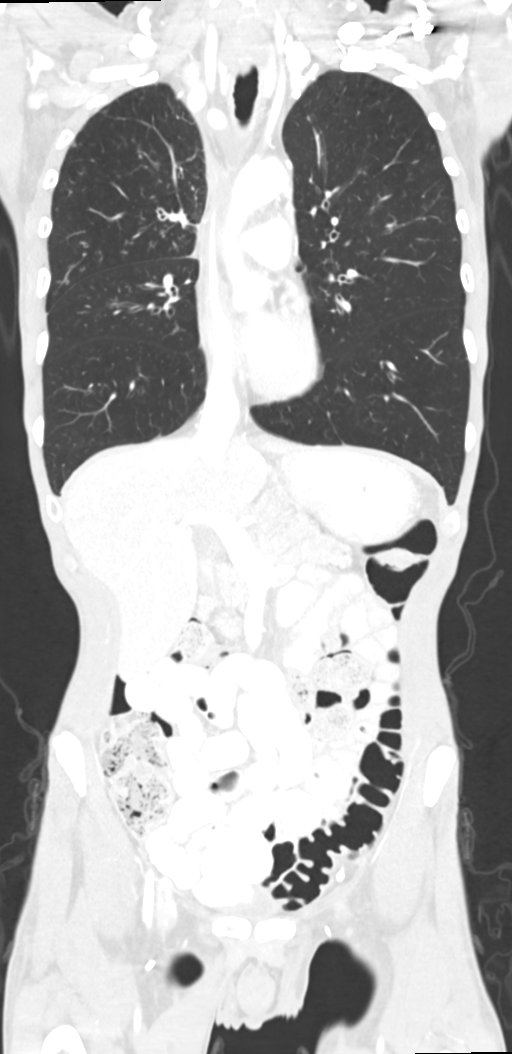
[im 78/130  lung]
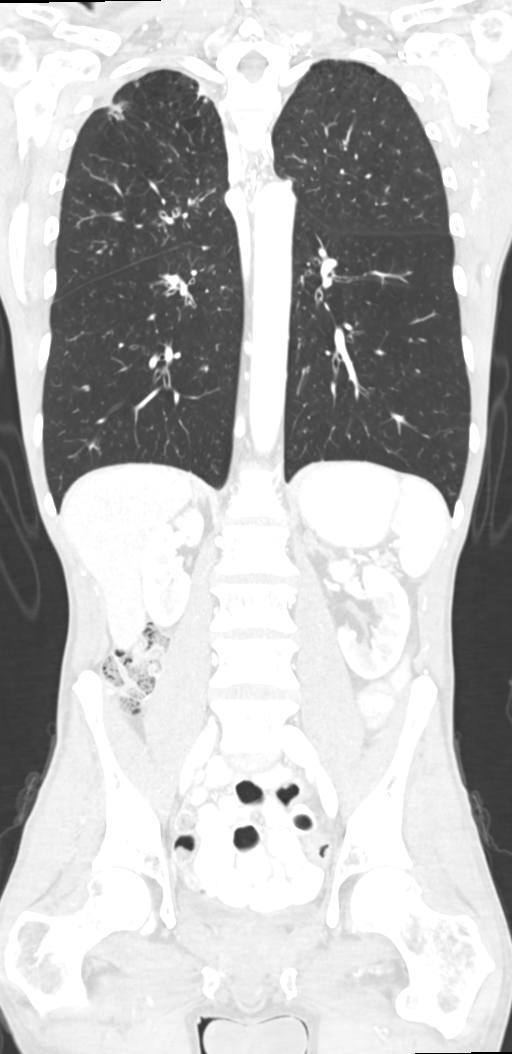

[11 of 36 positions shown; findings below may reference images not displayed]

FINDINGS: CT CHEST FINDINGS

Cardiovascular: Aortic atherosclerosis. Normal heart size, without
pericardial effusion. No central pulmonary embolism, on this
non-dedicated study.

SVC occlusion and collateral veins about the chest wall again
identified.

Mediastinum/Nodes: No supraclavicular adenopathy. Confluent right
paratracheal adenopathy measures 2.1 x 1.7 cm on image [DATE] versus
3.2 x 2.3 cm on the prior.

The right hilar adenopathy has resolved, with only mild soft tissue
thickening remaining on image [DATE].

Lungs/Pleura: No pleural fluid. Moderate to marked centrilobular
emphysema with right greater than left bronchial wall thickening.

Spiculated pleural-based right upper lobe pulmonary nodule measures
1.6 x 1.4 cm on image 32/3 versus 2.1 x 2.0 cm on the prior.

Bilateral upper lobe predominant peribronchovascular nodularity is
significantly progressive compared to 03/09/2018. Some of the areas
of nodularity have a "tree-in-bud" morphology. Suspect mucoid
impaction in the right upper lobe on image 74/3.

Clustered anterior left upper lobe nodules including at maximally
1.4 cm on image 109/3, new.

Musculoskeletal: No acute osseous abnormality.

CT ABDOMEN PELVIS FINDINGS

Hepatobiliary: Normal liver. Normal gallbladder, without biliary
ductal dilatation.

Pancreas: Normal, without mass or ductal dilatation.

Spleen: Normal in size, without focal abnormality.

Adrenals/Urinary Tract: Normal right adrenal gland. Similar mild
left adrenal thickening. Subtle heterogeneous enhancement of both
kidneys on delayed images. Example laterally on the right on image
[DATE] relatively diffusely on the left on image [DATE]. No
hydronephrosis. Normal urinary bladder.

Stomach/Bowel: Normal stomach, without wall thickening. Colonic
stool burden suggests constipation. Normal small bowel.

Vascular/Lymphatic: Separate origins of the common hepatic and
splenic arteries. Advanced aortic and branch vessel atherosclerosis.
Right femoral venous line terminating within the IVC just inferior
to the hepatic veins. No abdominopelvic adenopathy.

Reproductive: Normal prostate.

Other: No significant free fluid. Paucity of intra-abdominal fat. No
gross peritoneal/omental metastasis.

Musculoskeletal: Sclerotic lesions within the pelvis are unchanged
and likely bone islands.
IMPRESSION: 1. Response to therapy of right upper lobe primary and thoracic
nodal metastasis.
2. Progressive upper lobe predominant peribronchovascular
nodularity, likely related to infection, including atypical
etiologies.
3. No acute process or evidence of metastatic disease in the abdomen
or pelvis.
4. Subtle heterogeneous enhancement of the kidneys on delayed
images. Correlate with symptoms to suggest pyelonephritis.
5. Possible constipation.
6. Aortic atherosclerosis (T6B55-MRS.S) and emphysema (T6B55-IHG.O).

## 2019-09-12 IMAGING — DX DG CHEST 1V PORT
1 series · 1 of 1 positions shown · non-contrast
Comparison: Chest CT from 2 days ago

CLINICAL DATA: Chest tube.  Pericardial effusion.

EXAM:
PORTABLE CHEST 1 VIEW

[chest ap]
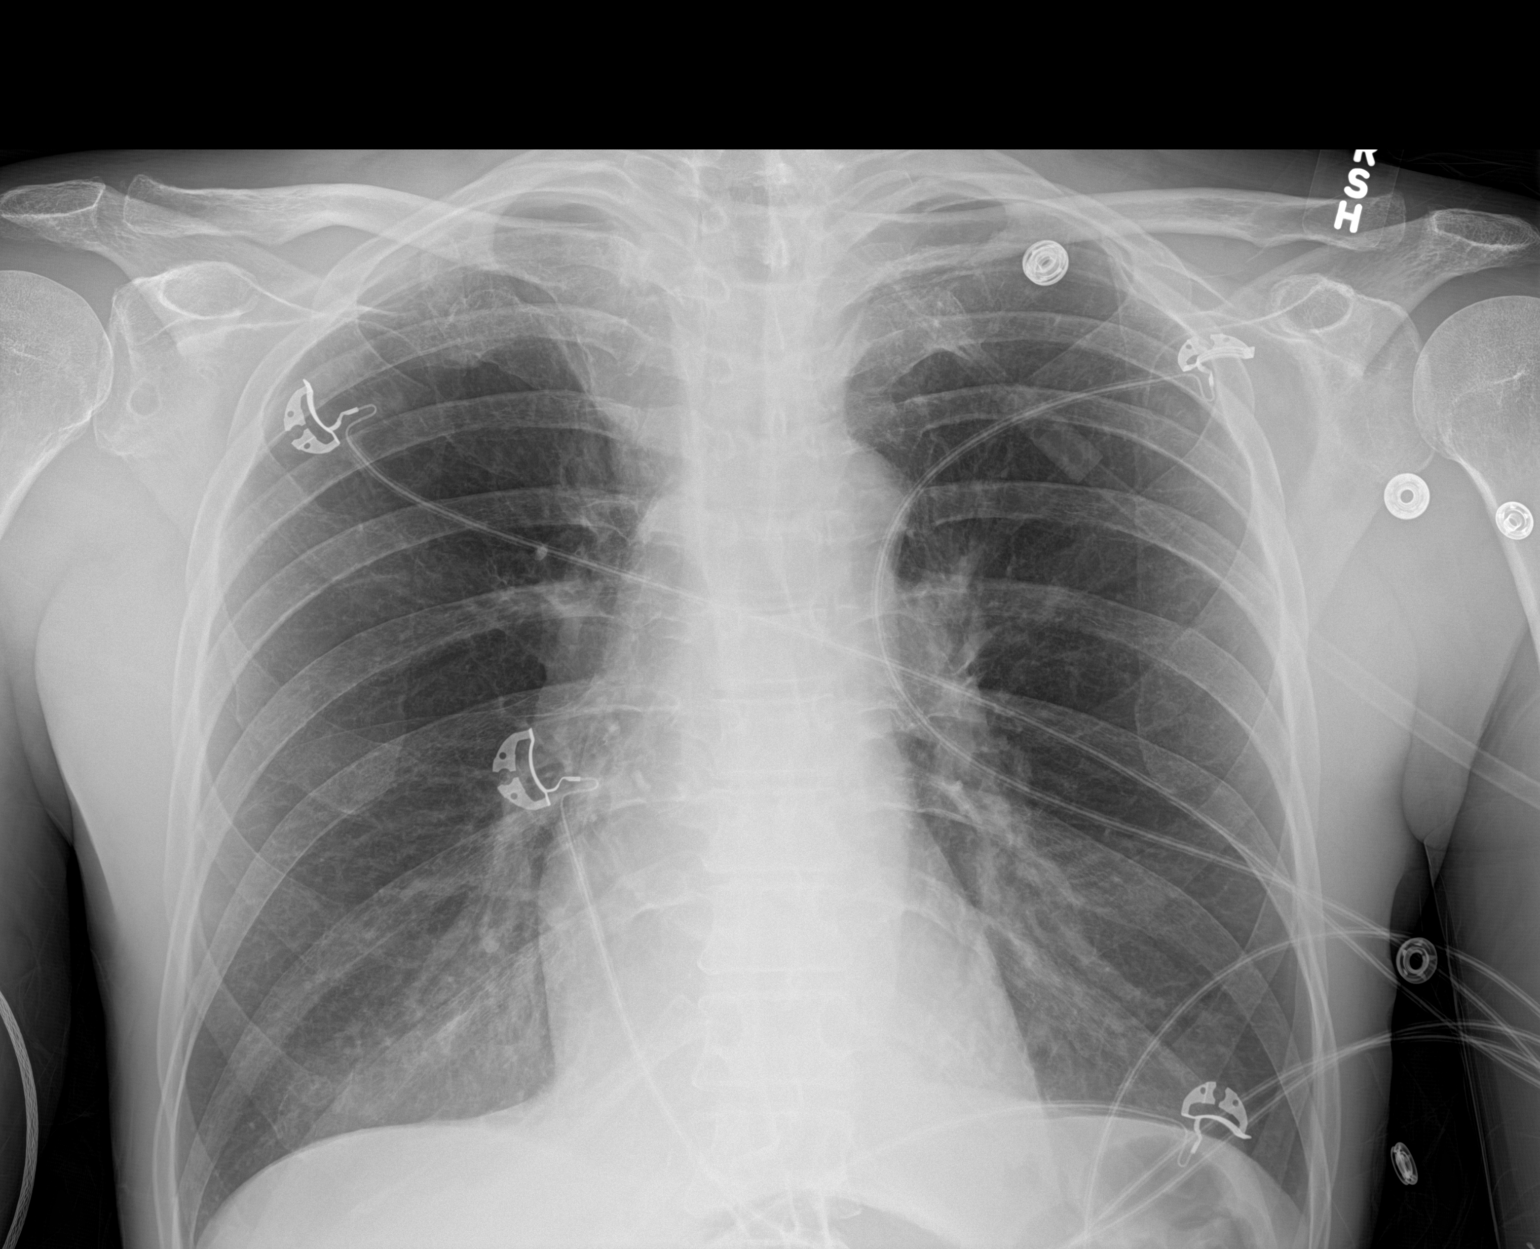

[1 of 1 positions shown; findings below may reference images not displayed]

FINDINGS: Normal cardiopericardial size, although also normal when pericardial
effusion was seen by CT. Haziness of the lower lungs symmetrically,
likely atelectasis. No edema, pneumothorax, or visible pleural
fluid.
IMPRESSION: 1. Stable cardiopericardial size. No pulmonary edema.
2. Symmetric haziness in the lower chest favoring atelectasis.

## 2019-12-23 IMAGING — CT CT SUPER D CHEST WITHOUT CONTRAST
2 of 5 series · 15 of 36 positions shown, 18 images · non-contrast
Comparison: 09/28/2018

CLINICAL DATA: Mass in left lung.  Scheduled for bronchoscopy.

EXAM:
CT CHEST WITHOUT CONTRAST
TECHNIQUE: Multidetector CT imaging of the chest was performed using thin slice
collimation for electromagnetic bronchoscopy planning purposes,
without intravenous contrast.

[Series 4: thins chest · axial · 0.65mm/px · z∈[-1295,-967]mm · 12 of 518 slices shown, 15 images]
[im 25/518  mediastinal]
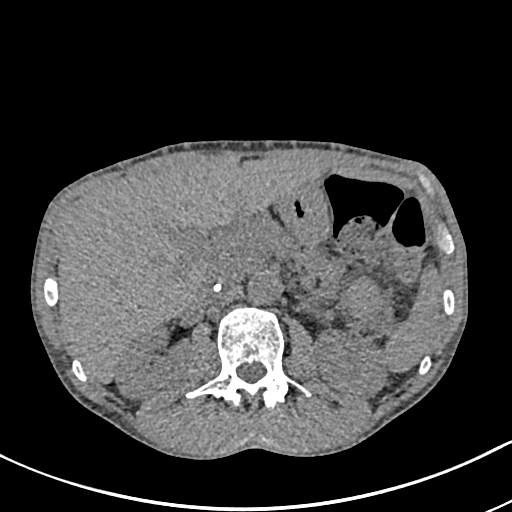
[im 25/518  lung]
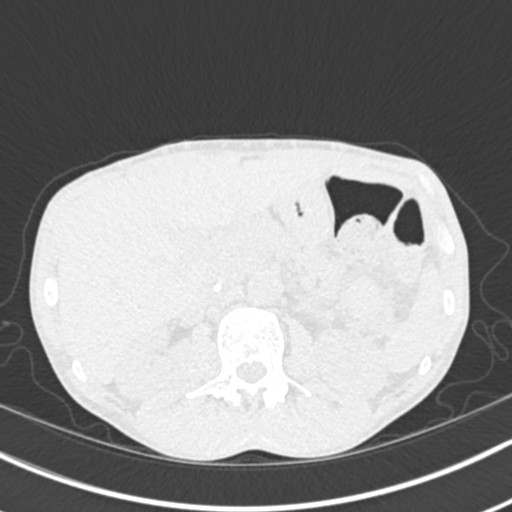
[im 74/518  lung]
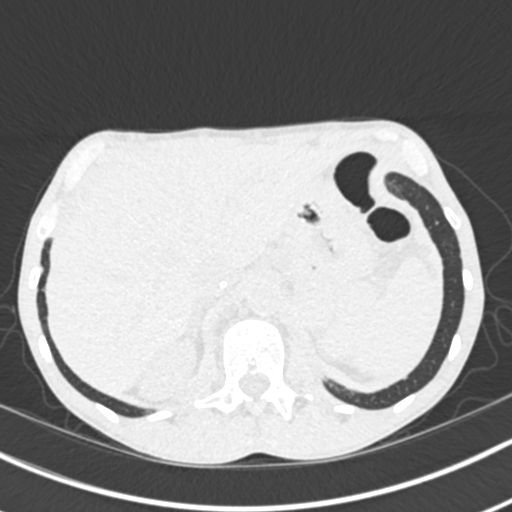
[im 124/518  lung]
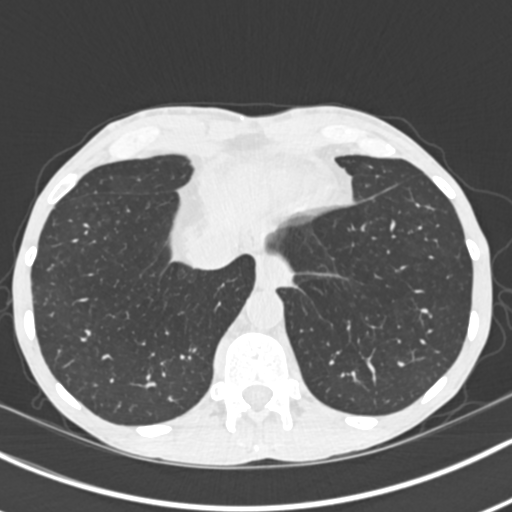
[im 148/518  lung]
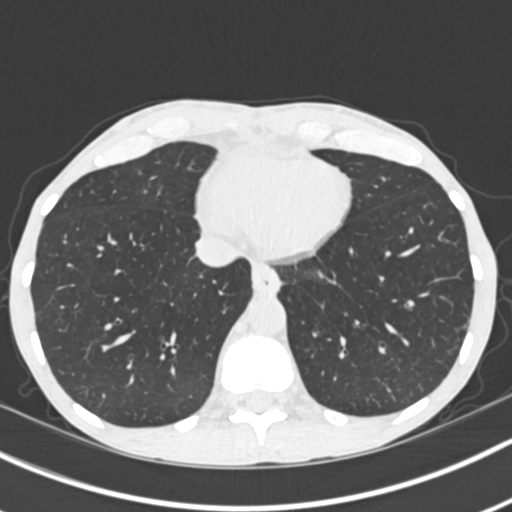
[im 197/518  mediastinal]
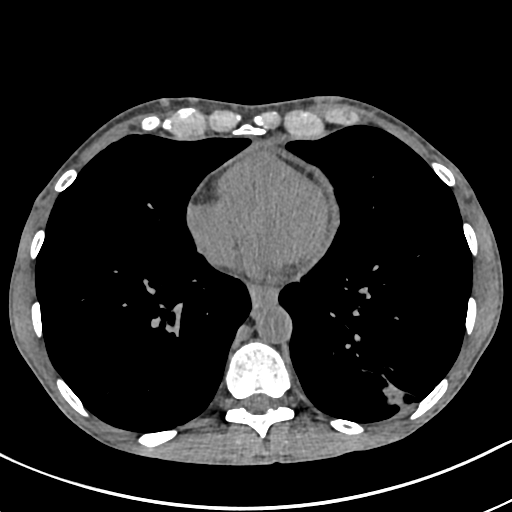
[im 197/518  lung]
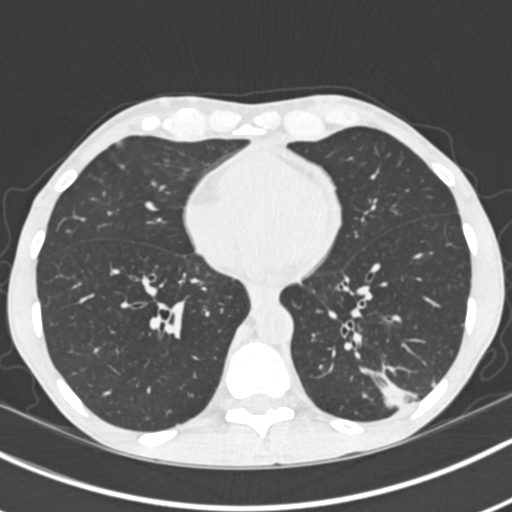
[im 247/518  lung]
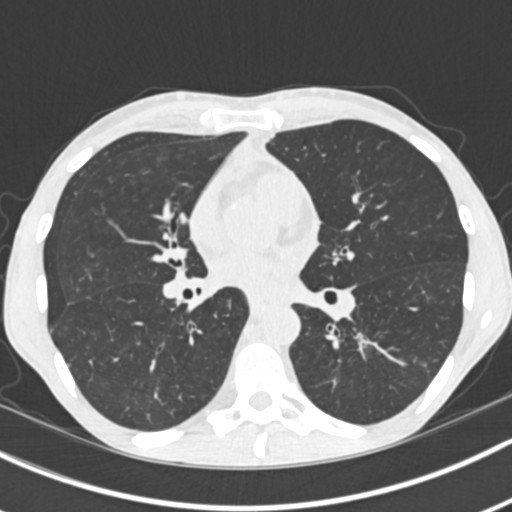
[im 271/518  lung]
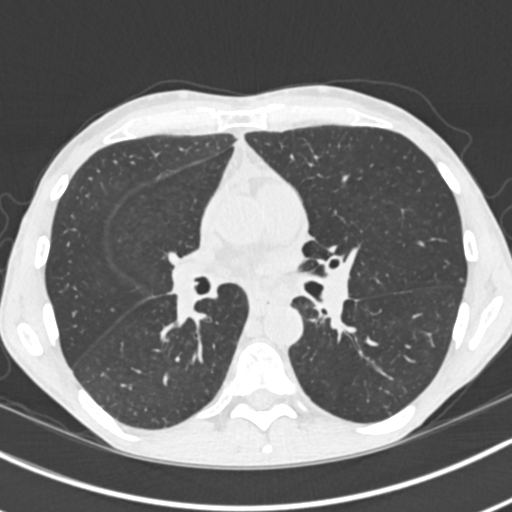
[im 321/518  lung]
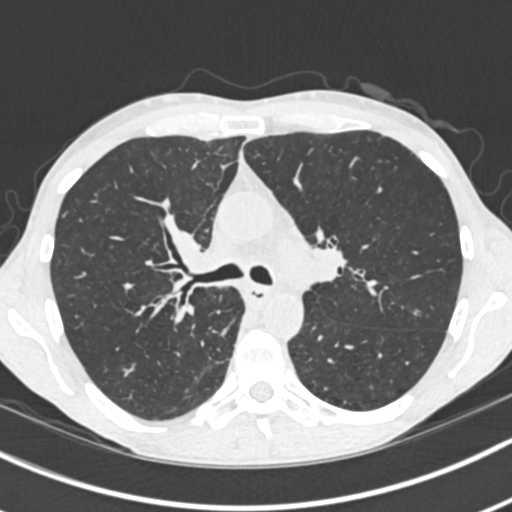
[im 370/518  mediastinal]
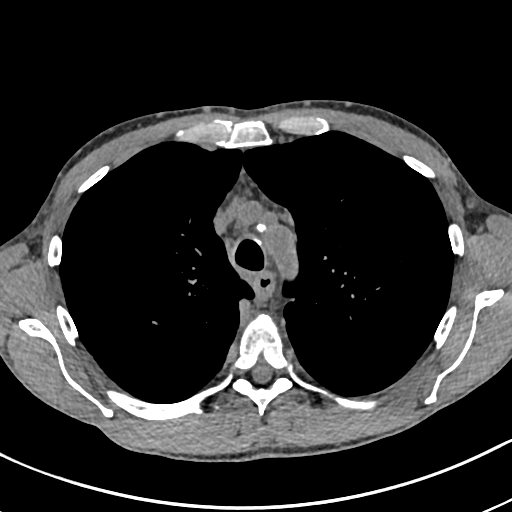
[im 370/518  lung]
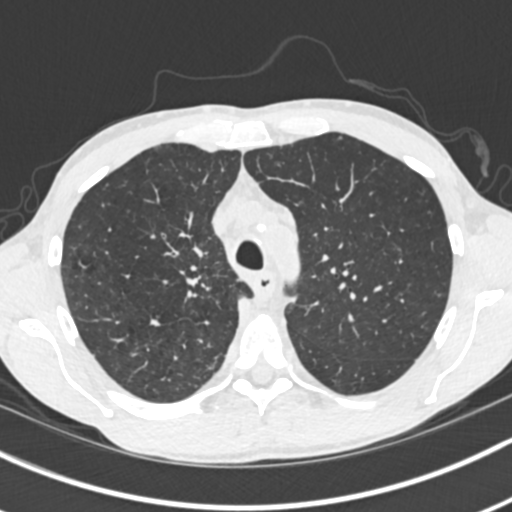
[im 394/518  lung]
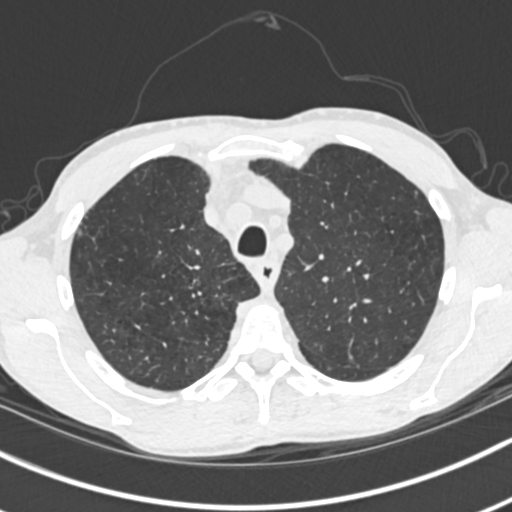
[im 444/518  lung]
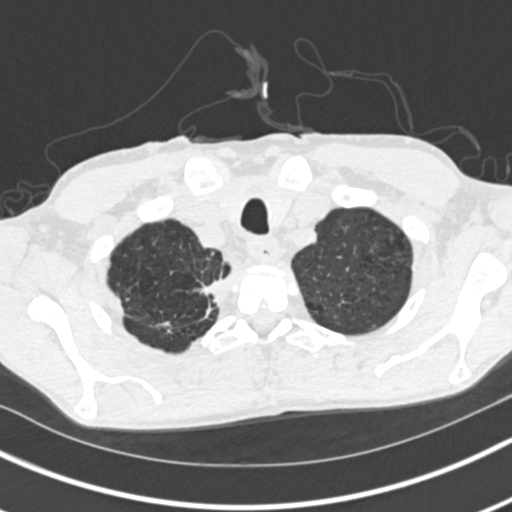
[im 493/518  lung]
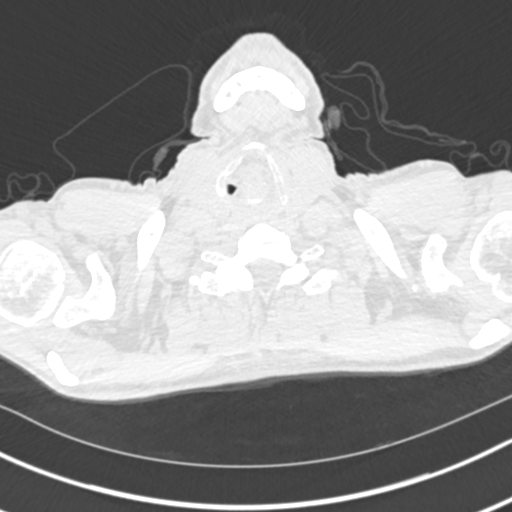

[Series 5: coronals chest · coronal · 0.65mm/px · 3 of 152 slices shown]
[im 31/152  lung]
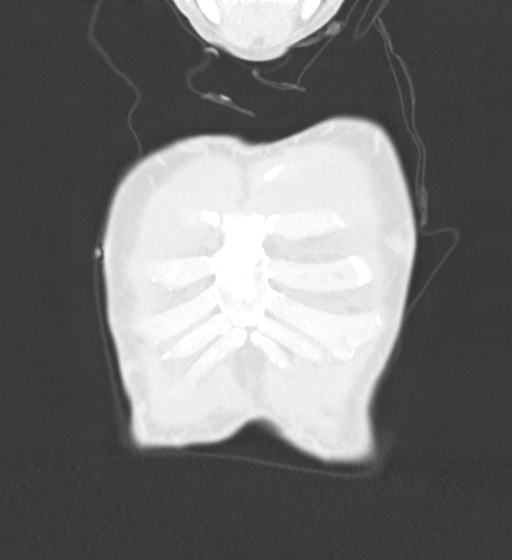
[im 61/152  lung]
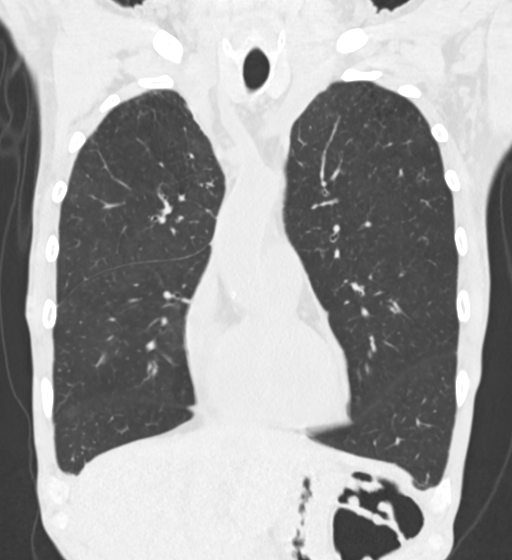
[im 91/152  lung]
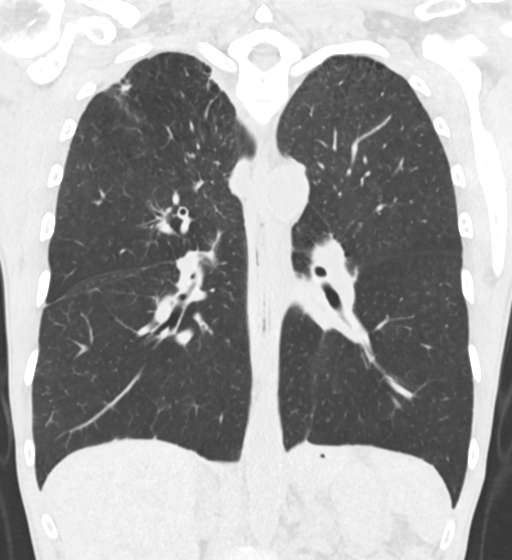

[15 of 36 positions shown; findings below may reference images not displayed]

FINDINGS: Cardiovascular: Aortic atherosclerosis. Mild pericardial thickening
versus effusion. Similar.

Mediastinum/Nodes: Normal appearance of the thyroid gland. The
trachea appears patent and is midline.

-There is ill-defined soft tissue infiltration within the
mediastinum which appears similar to 09/25/2018.

-index right paratracheal lymph node is stable measuring 1.6 cm.

-index pre-vascular node measures 0.7 cm, image [DATE].

-Index right hilar lymph node measures 1.3 cm, image [DATE].
Previously 1.5 cm.

Lungs/Pleura: No pleural effusion identified. The treated lesion
within the medial aspect of the right apex measures 1.5 by 1.3 cm,
image [DATE]. Previously this measured the same. Nodule within the
lateral right upper lobe measures 0.9 by 0.6 cm, image [DATE].
Unchanged. The left lower lobe central lung mass measures 2.9 cm,
image 41/3. Previously 2.1 cm. Peripheral wedge-shaped area of
subsegmental atelectasis within the posterolateral left lower lobe
is unchanged. Decrease in previous left pleural effusion. Moderate
change of emphysema.

Upper Abdomen: No acute abnormality.

Musculoskeletal: No chest wall mass or suspicious bone lesions
identified.
IMPRESSION: 1. Left lower lobe central lung mass is slightly increased in size
compared with the previous exam.
2. Stable treated tumor within the right upper lobe.
3. Stable appearance of borderline right paratracheal and right
hilar adenopathy.
4. Aortic Atherosclerosis (M50TG-35D.D) and Emphysema (M50TG-573.6).

## 2019-12-25 IMAGING — DX PORTABLE CHEST - 1 VIEW
1 series · 1 of 1 positions shown · non-contrast
Comparison: CT chest 10/11/2018

CLINICAL DATA: Status post bronchoscopy

EXAM:
PORTABLE CHEST 1 VIEW

[chest ap]
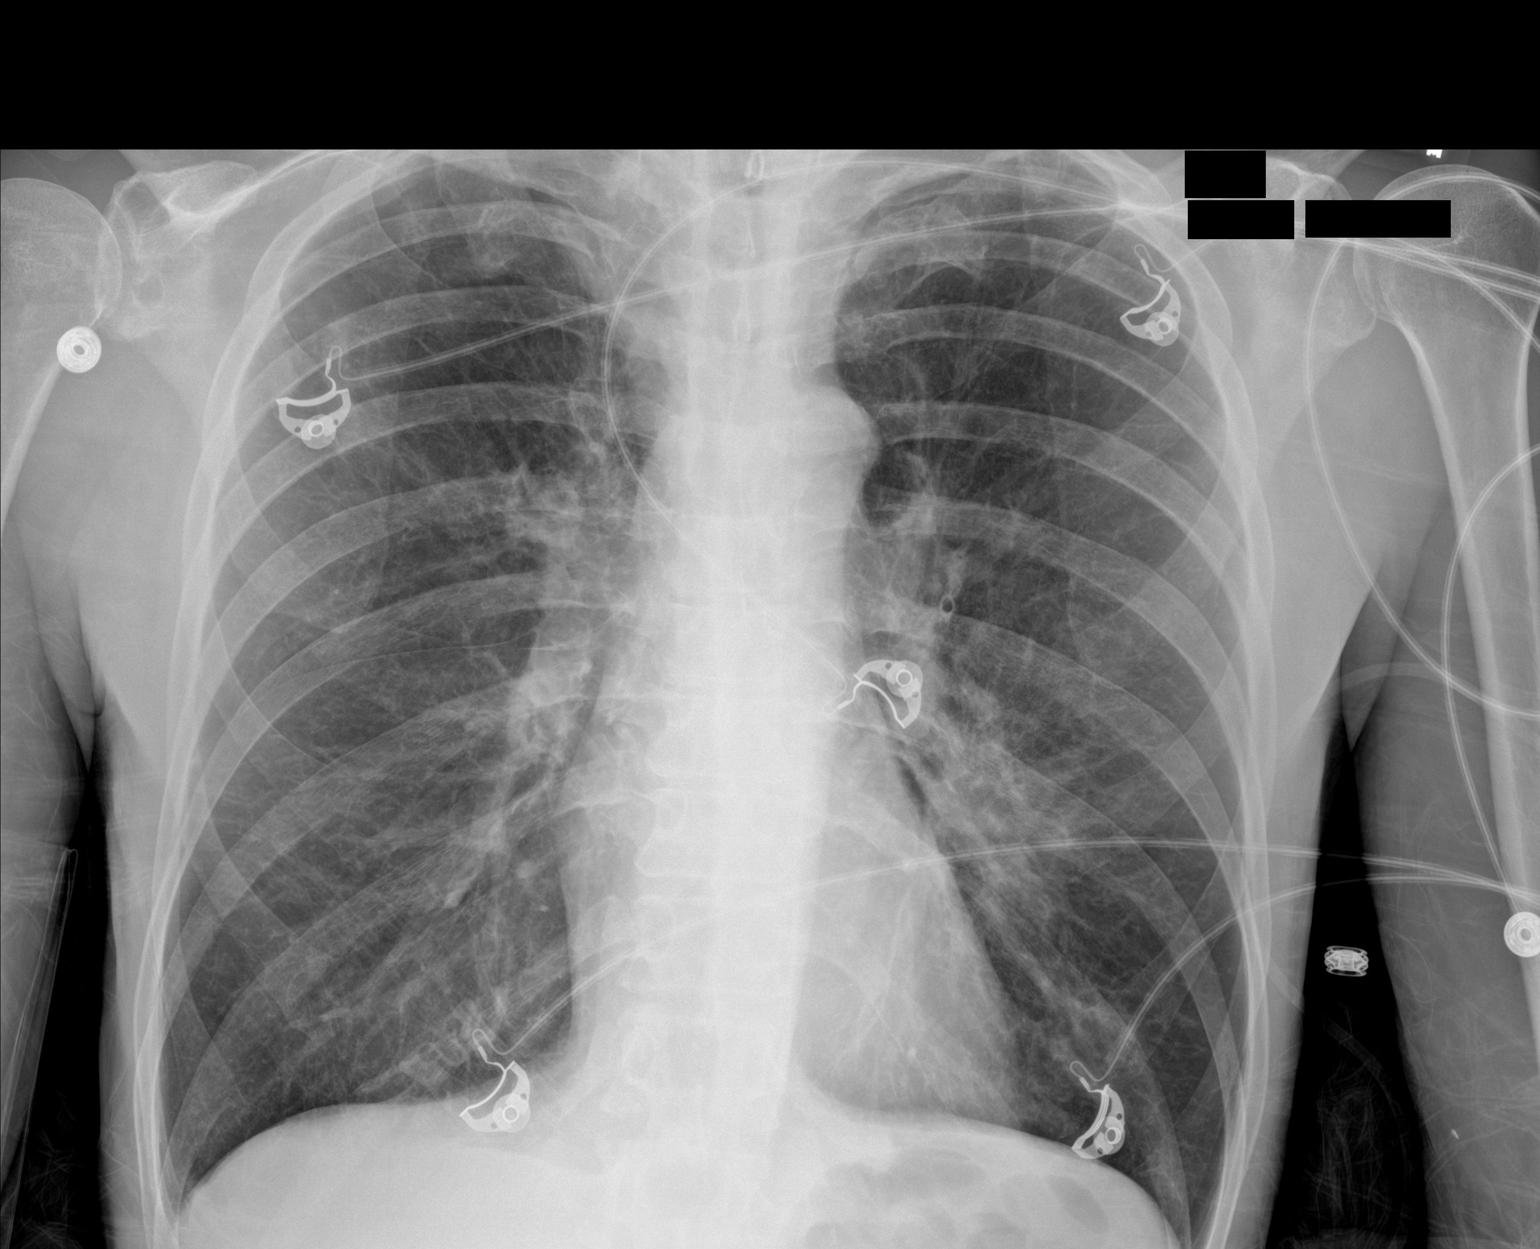

[1 of 1 positions shown; findings below may reference images not displayed]

FINDINGS: Trace right apical pneumothorax. Lungs are clear. No pleural
effusion. Normal cardiomediastinal contours.
IMPRESSION: Trace right apical pneumothorax.
